# Patient Record
Sex: Male | Born: 1945
Health system: Southern US, Community
[De-identification: ages and names within clinical notes are randomized; demographics above are authoritative.]

## PROBLEM LIST (undated history)

## (undated) DIAGNOSIS — N390 Urinary tract infection, site not specified: Secondary | ICD-10-CM

## (undated) DIAGNOSIS — E669 Obesity, unspecified: Secondary | ICD-10-CM

## (undated) DIAGNOSIS — G8929 Other chronic pain: Secondary | ICD-10-CM

## (undated) DIAGNOSIS — N529 Male erectile dysfunction, unspecified: Secondary | ICD-10-CM

## (undated) DIAGNOSIS — R001 Bradycardia, unspecified: Secondary | ICD-10-CM

## (undated) DIAGNOSIS — I472 Ventricular tachycardia, unspecified: Secondary | ICD-10-CM

## (undated) DIAGNOSIS — M1990 Unspecified osteoarthritis, unspecified site: Secondary | ICD-10-CM

## (undated) DIAGNOSIS — M779 Enthesopathy, unspecified: Secondary | ICD-10-CM

## (undated) DIAGNOSIS — I499 Cardiac arrhythmia, unspecified: Secondary | ICD-10-CM

## (undated) DIAGNOSIS — D509 Iron deficiency anemia, unspecified: Secondary | ICD-10-CM

## (undated) DIAGNOSIS — I4729 Other ventricular tachycardia: Secondary | ICD-10-CM

## (undated) DIAGNOSIS — R51 Headache: Secondary | ICD-10-CM

## (undated) DIAGNOSIS — Z720 Tobacco use: Secondary | ICD-10-CM

## (undated) DIAGNOSIS — I119 Hypertensive heart disease without heart failure: Secondary | ICD-10-CM

## (undated) DIAGNOSIS — I4892 Unspecified atrial flutter: Secondary | ICD-10-CM

## (undated) DIAGNOSIS — I10 Essential (primary) hypertension: Secondary | ICD-10-CM

## (undated) DIAGNOSIS — R519 Headache, unspecified: Secondary | ICD-10-CM

## (undated) HISTORY — DX: Essential (primary) hypertension: I10

## (undated) HISTORY — DX: Headache, unspecified: R51.9

## (undated) HISTORY — DX: Unspecified osteoarthritis, unspecified site: M19.90

## (undated) HISTORY — DX: Morbid (severe) obesity due to excess calories: E66.01

## (undated) HISTORY — DX: Urinary tract infection, site not specified: N39.0

## (undated) HISTORY — DX: Obesity, unspecified: E66.9

## (undated) HISTORY — DX: Iron deficiency anemia, unspecified: D50.9

## (undated) HISTORY — DX: Male erectile dysfunction, unspecified: N52.9

## (undated) HISTORY — DX: Tobacco use: Z72.0

## (undated) HISTORY — DX: Enthesopathy, unspecified: M77.9

## (undated) HISTORY — DX: Other chronic pain: G89.29

## (undated) HISTORY — PX: VASECTOMY: SHX75

## (undated) HISTORY — DX: Headache: R51

## (undated) HISTORY — DX: Unspecified atrial flutter: I48.92

---

## 2001-03-18 ENCOUNTER — Inpatient Hospital Stay (HOSPITAL_COMMUNITY): Admission: EM | Admit: 2001-03-18 | Discharge: 2001-03-22 | Payer: Self-pay | Admitting: Emergency Medicine

## 2001-03-18 ENCOUNTER — Encounter: Payer: Self-pay | Admitting: Emergency Medicine

## 2001-03-19 ENCOUNTER — Encounter: Payer: Self-pay | Admitting: Internal Medicine

## 2001-03-21 ENCOUNTER — Encounter: Payer: Self-pay | Admitting: Internal Medicine

## 2001-03-22 DIAGNOSIS — D509 Iron deficiency anemia, unspecified: Secondary | ICD-10-CM | POA: Diagnosis present

## 2001-03-22 HISTORY — DX: Iron deficiency anemia, unspecified: D50.9

## 2007-08-12 HISTORY — PX: TOTAL KNEE ARTHROPLASTY: SHX125

## 2007-08-15 ENCOUNTER — Inpatient Hospital Stay (HOSPITAL_COMMUNITY): Admission: RE | Admit: 2007-08-15 | Discharge: 2007-08-20 | Payer: Self-pay | Admitting: Orthopedic Surgery

## 2007-12-23 ENCOUNTER — Inpatient Hospital Stay (HOSPITAL_COMMUNITY): Admission: RE | Admit: 2007-12-23 | Discharge: 2007-12-27 | Payer: Self-pay | Admitting: Orthopedic Surgery

## 2008-08-26 ENCOUNTER — Emergency Department (HOSPITAL_COMMUNITY): Admission: EM | Admit: 2008-08-26 | Discharge: 2008-08-26 | Payer: Self-pay | Admitting: Emergency Medicine

## 2010-05-13 ENCOUNTER — Ambulatory Visit: Payer: Self-pay | Admitting: Cardiology

## 2010-05-30 ENCOUNTER — Ambulatory Visit (INDEPENDENT_AMBULATORY_CARE_PROVIDER_SITE_OTHER): Payer: Medicare Other | Admitting: Cardiology

## 2010-05-30 ENCOUNTER — Other Ambulatory Visit: Payer: Self-pay | Admitting: Cardiology

## 2010-05-30 ENCOUNTER — Ambulatory Visit
Admission: RE | Admit: 2010-05-30 | Discharge: 2010-05-30 | Disposition: A | Discharge status: Home / Self Care | Payer: Medicare Other | Source: Ambulatory Visit | Attending: Cardiology | Admitting: Cardiology

## 2010-05-30 DIAGNOSIS — I4892 Unspecified atrial flutter: Secondary | ICD-10-CM

## 2010-05-30 DIAGNOSIS — Z7901 Long term (current) use of anticoagulants: Secondary | ICD-10-CM

## 2010-05-30 DIAGNOSIS — Z01811 Encounter for preprocedural respiratory examination: Secondary | ICD-10-CM

## 2010-05-30 DIAGNOSIS — R0602 Shortness of breath: Secondary | ICD-10-CM

## 2010-05-30 DIAGNOSIS — I10 Essential (primary) hypertension: Secondary | ICD-10-CM

## 2010-06-03 ENCOUNTER — Ambulatory Visit (HOSPITAL_COMMUNITY)
Admission: RE | Admit: 2010-06-03 | Discharge: 2010-06-03 | Disposition: A | Discharge status: Home / Self Care | Payer: Medicare Other | Source: Ambulatory Visit | Attending: Cardiology | Admitting: Cardiology

## 2010-06-03 DIAGNOSIS — R791 Abnormal coagulation profile: Secondary | ICD-10-CM | POA: Insufficient documentation

## 2010-06-03 DIAGNOSIS — I4891 Unspecified atrial fibrillation: Secondary | ICD-10-CM | POA: Insufficient documentation

## 2010-06-03 DIAGNOSIS — Z5309 Procedure and treatment not carried out because of other contraindication: Secondary | ICD-10-CM | POA: Insufficient documentation

## 2010-06-03 LAB — PROTIME-INR
INR: 1.67 — ABNORMAL HIGH (ref 0.00–1.49)
Prothrombin Time: 19.9 seconds — ABNORMAL HIGH (ref 11.6–15.2)

## 2010-06-10 ENCOUNTER — Encounter (INDEPENDENT_AMBULATORY_CARE_PROVIDER_SITE_OTHER): Payer: Medicare Other

## 2010-06-10 DIAGNOSIS — Z7901 Long term (current) use of anticoagulants: Secondary | ICD-10-CM

## 2010-06-10 DIAGNOSIS — I4892 Unspecified atrial flutter: Secondary | ICD-10-CM

## 2010-06-16 ENCOUNTER — Encounter (INDEPENDENT_AMBULATORY_CARE_PROVIDER_SITE_OTHER): Payer: Medicare Other

## 2010-06-16 DIAGNOSIS — Z7901 Long term (current) use of anticoagulants: Secondary | ICD-10-CM

## 2010-06-16 DIAGNOSIS — I4892 Unspecified atrial flutter: Secondary | ICD-10-CM

## 2010-06-27 ENCOUNTER — Other Ambulatory Visit (INDEPENDENT_AMBULATORY_CARE_PROVIDER_SITE_OTHER): Payer: Medicare Other

## 2010-06-27 DIAGNOSIS — Z7901 Long term (current) use of anticoagulants: Secondary | ICD-10-CM

## 2010-06-27 DIAGNOSIS — I4892 Unspecified atrial flutter: Secondary | ICD-10-CM

## 2010-06-28 ENCOUNTER — Other Ambulatory Visit: Payer: Self-pay | Admitting: Emergency Medicine

## 2010-06-28 ENCOUNTER — Emergency Department (HOSPITAL_COMMUNITY)
Admission: EM | Admit: 2010-06-28 | Discharge: 2010-06-28 | Disposition: A | Discharge status: Home / Self Care | Payer: Medicare Other | Attending: Emergency Medicine | Admitting: Emergency Medicine

## 2010-06-28 DIAGNOSIS — E876 Hypokalemia: Secondary | ICD-10-CM | POA: Insufficient documentation

## 2010-06-28 DIAGNOSIS — L02419 Cutaneous abscess of limb, unspecified: Secondary | ICD-10-CM | POA: Insufficient documentation

## 2010-06-28 DIAGNOSIS — M7989 Other specified soft tissue disorders: Secondary | ICD-10-CM | POA: Insufficient documentation

## 2010-06-28 DIAGNOSIS — M79609 Pain in unspecified limb: Secondary | ICD-10-CM

## 2010-06-28 DIAGNOSIS — I4891 Unspecified atrial fibrillation: Secondary | ICD-10-CM | POA: Insufficient documentation

## 2010-06-28 DIAGNOSIS — Z7901 Long term (current) use of anticoagulants: Secondary | ICD-10-CM | POA: Insufficient documentation

## 2010-06-28 DIAGNOSIS — I10 Essential (primary) hypertension: Secondary | ICD-10-CM | POA: Insufficient documentation

## 2010-06-28 DIAGNOSIS — M545 Low back pain, unspecified: Secondary | ICD-10-CM | POA: Insufficient documentation

## 2010-06-28 DIAGNOSIS — Z79899 Other long term (current) drug therapy: Secondary | ICD-10-CM | POA: Insufficient documentation

## 2010-06-28 DIAGNOSIS — R599 Enlarged lymph nodes, unspecified: Secondary | ICD-10-CM | POA: Insufficient documentation

## 2010-06-28 DIAGNOSIS — E669 Obesity, unspecified: Secondary | ICD-10-CM | POA: Insufficient documentation

## 2010-06-28 LAB — DIFFERENTIAL
Basophils Relative: 0 % (ref 0–1)
Lymphocytes Relative: 21 % (ref 12–46)
Lymphs Abs: 1.6 10*3/uL (ref 0.7–4.0)
Monocytes Relative: 6 % (ref 3–12)
Neutro Abs: 5.2 10*3/uL (ref 1.7–7.7)

## 2010-06-28 LAB — BASIC METABOLIC PANEL
BUN: 14 mg/dL (ref 6–23)
CO2: 31 mEq/L (ref 19–32)
Calcium: 8.1 mg/dL — ABNORMAL LOW (ref 8.4–10.5)
Creatinine, Ser: 1.22 mg/dL (ref 0.4–1.5)
GFR calc non Af Amer: 59 mL/min — ABNORMAL LOW (ref >60)
Glucose, Bld: 149 mg/dL — ABNORMAL HIGH (ref 70–99)

## 2010-06-28 LAB — CBC
HCT: 34.2 % — ABNORMAL LOW (ref 39.0–52.0)
Hemoglobin: 11.3 g/dL — ABNORMAL LOW (ref 13.0–17.0)
MCH: 22.8 pg — ABNORMAL LOW (ref 26.0–34.0)
MCHC: 33 g/dL (ref 30.0–36.0)
RDW: 14.6 % (ref 11.5–15.5)

## 2010-07-01 ENCOUNTER — Ambulatory Visit (HOSPITAL_COMMUNITY)
Admission: RE | Admit: 2010-07-01 | Discharge: 2010-07-01 | Disposition: A | Discharge status: Home / Self Care | Payer: Medicare Other | Source: Ambulatory Visit | Attending: Cardiology | Admitting: Cardiology

## 2010-07-01 DIAGNOSIS — G4733 Obstructive sleep apnea (adult) (pediatric): Secondary | ICD-10-CM | POA: Insufficient documentation

## 2010-07-01 DIAGNOSIS — I4892 Unspecified atrial flutter: Secondary | ICD-10-CM | POA: Insufficient documentation

## 2010-07-01 DIAGNOSIS — I10 Essential (primary) hypertension: Secondary | ICD-10-CM | POA: Insufficient documentation

## 2010-07-01 DIAGNOSIS — E669 Obesity, unspecified: Secondary | ICD-10-CM | POA: Insufficient documentation

## 2010-07-01 HISTORY — PX: CARDIOVERSION: SHX1299

## 2010-07-03 NOTE — Op Note (Signed)
  NAMECORDARIOUS, WIGEN            ACCOUNT NO.:  [id number]  MEDICAL RECORD NO.:  [id number]           PATIENT TYPE:  O  LOCATION:  [facility]                         FACILITY:  [facility]  PHYSICIAN:  Peter M. Martinique, M.D.  DATE OF BIRTH:  [birth year]  DATE OF PROCEDURE:  07/01/2010 DATE OF DISCHARGE:  07/01/2010                              OPERATIVE REPORT   INDICATIONS FOR PROCEDURE:  A 65 year old African American male with persistent atrial flutter that is symptomatic.  The patient has been anticoagulated on Coumadin for at least 6 weeks.  PROCEDURE NOTE:  The patient was placed on monitor and given oxygen therapy.  Per Anesthesia, he was given IV Pentothal with good anesthesia.  He received a single biphasic synchronized DC shock at 150 joules with prompt conversion to sinus rhythm.  He had no complications.  FINAL ASSESSMENT:  Successful elective direct current cardioversion.          ______________________________ Peter M. Martinique, M.D.     PMJ/MEDQ  D:  07/01/2010  T:  07/02/2010  Job:  [id number]  Electronically Signed by PETER Martinique M.D. on 07/03/2010 03:41:13 PM

## 2010-07-04 ENCOUNTER — Telehealth: Payer: Self-pay | Admitting: Cardiology

## 2010-07-04 NOTE — Telephone Encounter (Signed)
Patient called about the potassium pills, he states he is feeling light headed and needs medication.  Please call cell number.

## 2010-07-07 ENCOUNTER — Ambulatory Visit (HOSPITAL_BASED_OUTPATIENT_CLINIC_OR_DEPARTMENT_OTHER): Payer: Medicare Other

## 2010-07-16 ENCOUNTER — Encounter: Payer: Self-pay | Admitting: Nurse Practitioner

## 2010-07-17 ENCOUNTER — Encounter: Payer: Self-pay | Admitting: Nurse Practitioner

## 2010-07-18 ENCOUNTER — Ambulatory Visit: Payer: Medicare Other | Admitting: Nurse Practitioner

## 2010-07-21 ENCOUNTER — Encounter: Payer: Self-pay | Admitting: Nurse Practitioner

## 2010-07-21 ENCOUNTER — Ambulatory Visit (INDEPENDENT_AMBULATORY_CARE_PROVIDER_SITE_OTHER): Payer: Medicare Other | Admitting: Nurse Practitioner

## 2010-07-21 VITALS — BP 140/90 | HR 60 | Ht 73.0 in | Wt 336.2 lb

## 2010-07-21 DIAGNOSIS — I4891 Unspecified atrial fibrillation: Secondary | ICD-10-CM

## 2010-07-21 DIAGNOSIS — I10 Essential (primary) hypertension: Secondary | ICD-10-CM

## 2010-07-21 DIAGNOSIS — I119 Hypertensive heart disease without heart failure: Secondary | ICD-10-CM | POA: Insufficient documentation

## 2010-07-21 DIAGNOSIS — I4892 Unspecified atrial flutter: Secondary | ICD-10-CM

## 2010-07-21 DIAGNOSIS — Z7901 Long term (current) use of anticoagulants: Secondary | ICD-10-CM

## 2010-07-21 NOTE — Assessment & Plan Note (Addendum)
INR is therapeutic. We will recheck in 2 weeks. Will discuss with Dr. Martinique his long term plan for anticoagulation.

## 2010-07-21 NOTE — Assessment & Plan Note (Signed)
He is not able to afford the Benicar Hct. Blood pressure is up. I have given him samples. We will plan on switching him to Hyzaar on return. Hopefully he can afford the generic. I explained the importance of blood pressure control to him.

## 2010-07-21 NOTE — Assessment & Plan Note (Signed)
He is improved post cardioversion. His EKG shows junctional tachycardia. Rate is 87. We will leave him on his coumadin. I will see him back in about 3 weeks with Dr. Martinique. We will plan to repeat the EKG on return.He may resume having sex.  Patient is agreeable to this plan and will call if any problems develop in the interim.

## 2010-07-21 NOTE — Patient Instructions (Addendum)
Stay on your current medicines. I have given you samples of the Benicar Hct. We will plan on changing you to Hyzaar on return. Your coumadin level is good. Stay on your same dose. We will see you back in 3 weeks and repeat your EKG on return. Call for any problems. You may resume your sexual relations. Take the bottle of Furosemide to the drug store today. They will call us for a refill.

## 2010-07-21 NOTE — Progress Notes (Signed)
History of Present Illness: Nathan Grant is seen today for a post hospital visit. He is seen for Dr. Martinique. He had a cardioversion on March 20th for atrial flutter. He is feeling better. He no longer has chest pain and is not short of breath like he was prior to the cardioversion. He is interested in resuming sexual relations. He has had no problems with his coumadin. His level looks good. Blood pressure is elevated. He is not able to afford the Benicar Hct. He also reports that he is on Lasix. We do not have this med listed. The drug store should be calling us later today.  Current Outpatient Prescriptions on File Prior to Visit  Medication Sig Dispense Refill  . amLODipine (NORVASC) 10 MG tablet Take 10 mg by mouth daily.        [name] aspirin 81 MG tablet Take 81 mg by mouth daily.        . fish oil-omega-3 fatty acids 1000 MG capsule Take 1 g by mouth daily.        . metoprolol succinate (TOPROL-XL) 25 MG 24 hr tablet Take 25 mg by mouth daily.        . multivitamin (THERAGRAN) per tablet Take 1 tablet by mouth daily.        . Tadalafil (CIALIS PO) Take by mouth as needed.        . Warfarin Sodium (COUMADIN PO) Take by mouth. Taking as directed.       . olmesartan-hydrochlorothiazide (BENICAR HCT) 40-25 MG per tablet Take 1 tablet by mouth daily.          Allergies no known allergies  Past Medical History  Diagnosis Date  . Atrial flutter     s/p cardioversion 06/2010  . Chronic anticoagulation   . HTN (hypertension)   . Morbid obesity   . Osteoarthritis     Past Surgical History  Procedure Date  . Total knee arthroplasty 2009    Bilateral  . Cardioversion 07/01/2010    History  Smoking status  . Never Smoker   Smokeless tobacco  . Current User  . Types: Chew    History  Alcohol Use No    Family History  Problem Relation Age of Onset  . Alcohol abuse Brother     Review of Systems: The review of systems is as noted above. He is not lightheaded or dizzy. He has not had any  further palpitations. He feels better overall. His swelling is better. All other systems were reviewed and are negative.  Physical Exam: BP 160/98  Pulse 60  Ht 6\' 1"  (1.854 m)  Wt 336 lb 3.2 oz (152.499 kg)  BMI 44.36 kg/m2 Patient is very pleasant and in no acute distress. He is morbidly obese. Skin is warm and dry. Color is normal.  HEENT is unremarkable. Normocephalic/atraumatic. PERRL. Sclera are nonicteric. Neck is supple. No masses. No JVD. Lungs are clear. Cardiac exam shows a regular rate and rhythm. Abdomen is soft and obese. Extremities are full with trace edema. Gait and ROM are intact. No gross neurologic deficits noted.  LABORATORY DATA:  INR today is 2.7                                         His EKG shows a junctional tachycardia with PVC's. Rate is 87. No atrial flutter.    Assessment / Plan:

## 2010-07-31 ENCOUNTER — Ambulatory Visit: Payer: Medicare Other | Admitting: Cardiology

## 2010-08-04 ENCOUNTER — Encounter: Payer: Medicare Other | Admitting: *Deleted

## 2010-08-11 ENCOUNTER — Encounter: Payer: Self-pay | Admitting: Cardiology

## 2010-08-11 ENCOUNTER — Encounter: Payer: Self-pay | Admitting: Nurse Practitioner

## 2010-08-11 ENCOUNTER — Ambulatory Visit (INDEPENDENT_AMBULATORY_CARE_PROVIDER_SITE_OTHER): Payer: Medicare Other | Admitting: Nurse Practitioner

## 2010-08-11 VITALS — BP 130/80 | HR 50 | Wt 335.0 lb

## 2010-08-11 DIAGNOSIS — I10 Essential (primary) hypertension: Secondary | ICD-10-CM

## 2010-08-11 DIAGNOSIS — I4891 Unspecified atrial fibrillation: Secondary | ICD-10-CM

## 2010-08-11 DIAGNOSIS — I4892 Unspecified atrial flutter: Secondary | ICD-10-CM

## 2010-08-11 DIAGNOSIS — R0609 Other forms of dyspnea: Secondary | ICD-10-CM

## 2010-08-11 DIAGNOSIS — R0600 Dyspnea, unspecified: Secondary | ICD-10-CM

## 2010-08-11 DIAGNOSIS — R0989 Other specified symptoms and signs involving the circulatory and respiratory systems: Secondary | ICD-10-CM

## 2010-08-11 DIAGNOSIS — E669 Obesity, unspecified: Secondary | ICD-10-CM

## 2010-08-11 LAB — BASIC METABOLIC PANEL
BUN: 14 mg/dL (ref 6–23)
CO2: 33 mEq/L — ABNORMAL HIGH (ref 19–32)
Calcium: 8.7 mg/dL (ref 8.4–10.5)
Chloride: 100 mEq/L (ref 96–112)
Creatinine, Ser: 1.1 mg/dL (ref 0.4–1.5)
GFR: 73.46 mL/min (ref >60.00)
Glucose, Bld: 92 mg/dL (ref 70–99)
Potassium: 3.4 mEq/L — ABNORMAL LOW (ref 3.5–5.1)
Sodium: 139 mEq/L (ref 135–145)

## 2010-08-11 LAB — BRAIN NATRIURETIC PEPTIDE: Pro B Natriuretic peptide (BNP): 218 pg/mL — ABNORMAL HIGH (ref 0.0–100.0)

## 2010-08-11 MED ORDER — FUROSEMIDE 20 MG PO TABS: 10.0000 mg | ORAL_TABLET | Freq: Every day | ORAL | Status: DC

## 2010-08-11 MED ORDER — LOSARTAN POTASSIUM-HCTZ 100-25 MG PO TABS: 1.0000 | ORAL_TABLET | Freq: Every day | ORAL | Status: DC

## 2010-08-11 NOTE — Assessment & Plan Note (Signed)
Blood pressure is better by me. I have switched him to Hyzaar 100/25. We will check a follow up BMET and BNP today. He may be able to stop his potassium.

## 2010-08-11 NOTE — Assessment & Plan Note (Signed)
Regular activity and cutting back on his caloric intake is encouraged.

## 2010-08-11 NOTE — Progress Notes (Signed)
   Nathan Grant Date of Birth: 07/23/1945   History of Present Illness: Nathan Grant is seen back today for a 3 week check. He is seen for Dr. Martinique. He is doing well. He notes that his breathing has been pretty good. He is not as tired. He denies palpitations. He is not able to check his blood pressure at home. He does not have a cuff. He denies chest pain. He had cardioversion back in March and has remained in sinus rhythm. He had a negative Cardiolite back in April of 2011. No evidence of ischemia or infarct. His EF was low by cardiolite but may have been due to gating. EF per echo was normal. His CHADs score is only one (HTN).   Current Outpatient Prescriptions on File Prior to Visit  Medication Sig Dispense Refill  . amLODipine (NORVASC) 10 MG tablet Take 10 mg by mouth daily.        [name] aspirin 81 MG tablet Take 81 mg by mouth daily.        . fish oil-omega-3 fatty acids 1000 MG capsule Take 1 g by mouth daily.        . metoprolol succinate (TOPROL-XL) 25 MG 24 hr tablet Take 25 mg by mouth daily.        . multivitamin (THERAGRAN) per tablet Take 1 tablet by mouth daily.        . Tadalafil (CIALIS PO) Take by mouth as needed.        [name] DISCONTD: olmesartan-hydrochlorothiazide (BENICAR HCT) 40-25 MG per tablet Take 1 tablet by mouth daily.        [name] DISCONTD: Warfarin Sodium (COUMADIN PO) Take by mouth. Taking as directed.         No Known Allergies  Past Medical History  Diagnosis Date  . Atrial flutter     s/p cardioversion 06/2010  . Chronic anticoagulation   . HTN (hypertension)   . Morbid obesity   . Osteoarthritis     Past Surgical History  Procedure Date  . Total knee arthroplasty 2009    Bilateral  . Cardioversion 07/01/2010    History  Smoking status  . Never Smoker   Smokeless tobacco  . Current User  . Types: Chew    History  Alcohol Use No    Family History  Problem Relation Age of Onset  . Alcohol abuse Brother     Review of Systems: The review of  systems is positive for erectile dysfunction.  All other systems were reviewed and are negative.  Physical Exam: BP 130/80  Pulse 50  Wt 335 lb (151.955 kg) Patient is very pleasant and in no acute distress. He is obese. Skin is warm and dry. Color is normal.  HEENT is unremarkable. Normocephalic/atraumatic. PERRL. Sclera are nonicteric. Neck is supple. No masses. No JVD. Lungs are clear. Cardiac exam shows a regular rate and rhythm. Abdomen is soft and obese.  Extremities are without edema. Gait and ROM are intact. No gross neurologic deficits noted.  LABORATORY DATA:  EKG today shows sinus rhythm with PVC's. He has diffuse T wave changes.    Assessment / Plan:

## 2010-08-11 NOTE — Assessment & Plan Note (Addendum)
He remains in sinus rhythm. His CHADs score is one. I have stopped his Coumadin. He remains on aspirin therapy. He can check his pulse and is willing to check it on a daily basis. He will call for any irregularity or other issues. He will see Dr. Martinique back in about 3 months. Patient is agreeable to this plan and will call if any problems develop in the interim.

## 2010-08-11 NOTE — Patient Instructions (Signed)
Stop the warfarin. Stay on your aspirin. Check your pulse daily. Let us know if it is not regular or if it is fast. I have refilled the Furosemide. I have changed the Benicar HCT to Hyzaar. Take this just one tablet daily. We will see you back in 3 months. We are going to check your labs today too.

## 2010-08-13 ENCOUNTER — Telehealth: Payer: Self-pay | Admitting: *Deleted

## 2010-08-13 ENCOUNTER — Other Ambulatory Visit: Payer: Self-pay | Admitting: *Deleted

## 2010-08-13 DIAGNOSIS — I4892 Unspecified atrial flutter: Secondary | ICD-10-CM

## 2010-08-13 NOTE — Telephone Encounter (Signed)
Notified son of results. Encouraged to increase foods in potassium. Sent to Dr. Coletta Memos

## 2010-08-13 NOTE — Telephone Encounter (Signed)
Message copied by Derek Mound on Wed Aug 13, 2010 10:56 AM ------      Message from: Truitt Merle      Created: Wed Aug 13, 2010  7:56 AM       Would stay on his current medicines. Try to increase foods higher in potassium. Will need to recheck a BMET on return.

## 2010-08-26 NOTE — Op Note (Signed)
Nathan Grant, Nathan Grant            ACCOUNT NO.:  [id number]   MEDICAL RECORD NO.:  [id number]          PATIENT TYPE:  INP   LOCATION:  5039                         FACILITY:  [facility]   PHYSICIAN:  Alta Corning, M.D.   DATE OF BIRTH:  Aug 24, 1945   DATE OF PROCEDURE:  12/23/2007  DATE OF DISCHARGE:                               OPERATIVE REPORT   PREOPERATIVE DIAGNOSIS:  End-stage degenerative joint disease, left  knee.   POSTOPERATIVE DIAGNOSIS:  End-stage degenerative joint disease, left  knee.   PRINCIPAL PROCEDURE:  Left total knee replacement with a sigma system  side-to-side femur and side-to-side tibia, 10-mm bridging bearing, and  41 mm all-polyethylene patella.  1. Computer-assisted left total knee replacement.   SURGEON:  Alta Corning, MD   ASSISTANT:  Gary Fleet, PA   ANESTHESIA:  General.   BRIEF HISTORY:  Nathan Grant is a 65 year old male with a long history  of having had severe bilateral degenerative joint disease.  We replaced  his right knee about 3 months ago.  He had done well with this, but he  continued to have complaints of severe pain in the left knee.  He was  also taken to the operating room for total knee replacement.   PROCEDURE:  The patient was taken to the operating room.  After adequate  anesthesia was obtained under general anesthetic, the patient placed in  supine on operating table.  The left leg was prepped and draped in the  usual sterile fashion.  Following this, the leg was exsanguinated and  tourniquet was inflated to 350 mmHg.  Following this, an incision was  made.  Subcutaneous tissues taken down at the level of the extensor  mechanism and the medial parapatellar arthrotomy was undertaken.  There  was significant amount of bleeding, which was encountered at this point  and we felt that we needed to let the tourniquet down.  He still had  further significant bleeding.  At that point, we re-exsanguinated the  knee when they got  the pressures under better control on top and  increased the tourniquet pressure to 400 and this gave reasonable  control down the operative field.  At this point, after arthrotomy was  performed, medial and lateral meniscectomy were performed as well as  anterior and posterior cruciate excision and then attention was turned  towards placement of the computer-assistance module, 2 pins in the  tibia, 2 pins in the femur and then a registration process, all these  adds about 30 minutes of surgical procedure.  Once this was completed,  the tibia was kept perpendicular to the long axis.  The femur was kept  perpendicular to the anatomic axis and then spacer blocks were put in  place.  The knee at this point could come into easy full extension and  was neutrally aligned.  At this point, the femur was sized and anterior  and posterior cuts were made as well as chamfers and a box cut.  Attention was turned to the tibia where rotational alignment was chosen  and the central portion of the tibial tray was drilled and  keeled.  Once  this was completed, trial components were put in place.  Excellent range  of motion was achieved, perfect neutral long alignment with a 12.5  bearing.  Attention was turned to the patella where a large patella was  encountered, but was cut down to the level of 14 mm and 41-mm  Polyethylene patella was placed.  Following this, the trial components  were removed.  The knee was copiously and thoroughly lavaged with  pulsatile lavage irrigation and suction dry.  The femoral components  were cemented in place, size 5 femur and size 5 tibia.  A trial 12.5  bridging bearing was placed and a 41-mm patella was put and the patellar  clamp was put in place and all cement had been hardened.  The computer  was evaluated and showed perfect neutral long alignment with perfect gap  balance.  The trial poly was then removed.  Tourniquet was let down.  All bleeding was controlled with  electrocautery.  The wound was  copiously and thoroughly irrigated again and suctioned dry.  A final  polyethylene was put in place.  A drain was placed laterally and the  knee was then closed.  Medial parapatellar arthrotomy was closed with 1  Vicryl running suture and the skin with 0 and 2-0 Vicryl, and skin  staples.  A sterile compression dressing was applied as well as knee  immobilizer and the patient was taken to the recovery room and was noted  to be in satisfactory condition.  Estimated blood loss of the procedure  was 250 mL.      Alta Corning, M.D.  Electronically Signed     JLG/MEDQ  D:  12/24/2007  T:  12/25/2007  Job:  [id number]

## 2010-08-26 NOTE — Op Note (Signed)
NAMETIRRELL, LUELLEN            ACCOUNT NO.:  [id number]   MEDICAL RECORD NO.:  [id number]          PATIENT TYPE:  INP   LOCATION:  5023                         FACILITY:  [facility]   PHYSICIAN:  Alta Corning, M.D.   DATE OF BIRTH:  10/09/1945   DATE OF PROCEDURE:  08/15/2007  DATE OF DISCHARGE:                               OPERATIVE REPORT   PREOPERATIVE DIAGNOSIS:  End-stage degenerative joint disease, bilateral  knees.   POSTOPERATIVE DIAGNOSIS:  End-stage degenerative joint disease,  bilateral knees.   OPERATIONS AND PROCEDURES:  1. Right total knee replacement with a sigma system side-to-side femur      and side-to-side tibia, 10-mm bridging bearing, and 41 mm      __________ . rotating patella.  2. Computer-assisted right total knee replacement.   SURGEON:  Alta Corning, MD   ASSISTANT:  Gary Fleet, PA   ANESTHESIA:  General.   BRIEF HISTORY:  Mr. Callands is a 65 year old male with a long  history  of having had significant bilateral degenerative joint disease who  treated conservative for prolonged period of time.  He has continued to  have __________  complaints of pain, despite antiinflammatory medication  activity modification and use of the cane, the patient was taken to the  operating room for right total knee replacement.  __________  large  size, related to the young age, it was felt that computer-assisted  alignment would be critical to give the __________  long leg below  prosthesis and __________  preoperatively.   PROCEDURE:  The patient was brought to the operating room.  After  adequate anesthesia was obtained under general anesthetic, the patient  placed in supine on table.  The right leg was then prepped and draped in  the usual sterile fashion.  Following this, right leg was examined and  __________  360 mmHg.  Following this, a midline incision was made.  Subcutaneous tissues taken at down the level of the extensor mechanism,  where the  medial parapatellar arthrotomy was under taken after marking  the extensor mechanism.  Once this was completed, attention was turned  towards the excision of medial and lateral meniscus as well as the  retropatellar fat pad.  The synovectomy was performed __________  axis  of the anterior femur and the anterior and posterior cruciates were  excised.  At this point, the case was stopped for placement of the  computer __________  in the femur.  At this point, the registration  process was under taken __________  photographs in the case.  Once this  was completed, the attention was turned towards the cutting the tibia  perpendicular to its long axis.  This was done with the computer  assistance.  Once this was completed, attention was turned towards the  femur __________  distal femur was taken and this was done under  computer guidance to create a rectangular space.  Once this was  completed, a trial  full extension.  There was, interestingly, very  large tibial tubercle osteophyte which did kind of __________  at the  top, but once we kind of  move it off to a side, we can tell this was an  excellent __________ .  Once this was completed, attention was turned  towards the femur which __________  essentially perfect trial of the  anteroposterior __________ .  Once this was completed, attention was  turned to the tibia which excised to __________  was placed as well as  the __________  the rotational care was then __________  in place.  Once  this was completed, attention was turned towards the placement  __________  side-to-side femur and side-to-side tibia __________  full  extension and perfect suture along the alignment with the __________  computer.  __________  symmetric and full extension and __________  flexion of 90 degrees.  The patellar was then excised to __________ .  Once this was completed, the trial of range of motion was under taken  and excellent __________  full flexion with  __________ .  At this point,  the tibia was copiously irrigated with lavage with __________  lavage  irrigation.  Once this was completed, the femoral compartments were  cemented in the place a size 5 femur and size 5 tibia, a 10 mm bridging  __________ .  The computer was again used to check the __________  flexion.  At this point, the __________  with electrocautery.  Once this  was completed, attention was turned towards the __________  full range  of motion and was __________ .  At this point, the __________  was  closed with 1 Vicryl, running subcu with 0 and 2-0 Vicryl, and skin with  staples.  A sterile compression dressing was applied as well as knee  immobilizer and __________ .  The patient was taken to the recovery room  and was noted to be in satisfactory condition.   ESTIMATED BLOOD LOSS:  Less than 300 mL.      Alta Corning, M.D.     Corliss Skains  D:  08/15/2007  T:  08/16/2007  Job:  [id number]

## 2010-08-26 NOTE — Op Note (Signed)
NAMEOBRYANT, BOREY            ACCOUNT NO.:  [id number]   MEDICAL RECORD NO.:  [id number]          PATIENT TYPE:  INP   LOCATION:  5023                         FACILITY:  [facility]   PHYSICIAN:  Alta Corning, M.D.   DATE OF BIRTH:  1946/03/26   DATE OF PROCEDURE:  08/15/2007  DATE OF DISCHARGE:  08/20/2007                               OPERATIVE REPORT   PREOPERATIVE DIAGNOSIS:  End-stage degenerative joint disease of  bilaterally knees.   POSTOPERATIVE DIAGNOSIS:  End-stage degenerative joint disease of  bilaterally knees.   PRINCIPAL PROCEDURE:  1. Right total knee replacement with Sigma system with a size 5 femur,      a size 5 tibia, a 10-mm bridging bearing, and a 41-mm all-      polyethylene patella.  2. Computer-assisted right total knee replacement.   SURGEON:  Alta Corning, M.D.   ASSISTANT:  Gary Fleet, P.A.   ANESTHESIA:  General.   BRIEF HISTORY:  Mr. Calkin is a 65 year old male with long history of  having had significant and severe bilateral degenerative joint disease.  He had been treated conservatively for a prolonged period of time.  Because of continued complaints of pain and bilateral knees, he was  evaluated in the office and found to have severe bone-on-bone arthritic  problems in both knees.  We had treated conservatively with  antiinflammatory medication activity modification.  Because of failure  of conservative care, he was ultimately taken to the operating room for  right total knee replacement.  Because of the severe nature of his  deformity and relatively young age, it was felt that computer-assistance  will be appropriate to be used intraoperatively, and this was chosen to  be used preoperatively.  He was brought to the operating room for this  procedure.   PROCEDURE:  The patient brought to the operating room and after adequate  anesthesia was obtained with general anesthetic, the patient was placed  supine on the operating table.   The right leg was then prepped and  draped in usual sterile fashion.  Following this, the leg was  exsanguinated and blood pressure tourniquet was inflated to 350 mmHg.  Following this, a midline incision was made and the subcutaneous tissue  was dissected down to the level of the extensor mechanism and a medial  parapatellar arthrotomy was undertaken.  The anterior and posterior  cruciates were then excised as well as medial lateral meniscus and the  retropatellar fat pad.  Once this was undertaken, the knee was exposed,  and the computer assistance modules were placed, 2 pins in the tibia, 2  pins in the femur, and the knee arrays were placed.  The registration  process was undertaken.  This adds about 20 minutes to the surgical  procedure.  Once the registration process was completed, the tibia was  cut perpendicular to the long axis, and attention was then turned to the  distal femur, which is cut perpendicular to the anatomic axis.  Once  this was completed, spacer blocks were put in place, and excellent  balance and neutral long alignment was achieved.  At  this point,  rotational alignment was performed with computer assistance, and the  anterior and posterior cuts were performed as well as the chamfer cuts.  Once this was done, the box cut was performed, and then the trial  component was put on the femur.  The tibia was then exposed and when  this was done, tibia was sized, and a tibia trial component was put in  place then rotationally appropriate alignment and the central portion  was then drilled and the rotational phalange was then placed.  Once this  was completed, a 10-mm bridging bearing was put in place and excellent  perfect neutral long alignment was achieved under computer assistance.  Once this was completed, the patella was cut to level of 14-mm, and then  these were drilled out with 3 holes of the patella and trial patellar  button was put in place.  Again under  computer assistance, the trial  range of motion was achieved, and excellent perfect neutral long  alignment with gap balancing in both flexion and extension.  At this  point, the trial components were removed.  The knee was then copiously  and thoroughly irrigated with pulse salvage irrigation and suction dry.  The areas of bone were then dried with a sponge.  At this point, the  cement was mixed on the back table, 2 batches with 2 g Kefzol and the  cement and the final components were cemented into place.  A size 5  femur, size 5 tibia, and 10-mm trial was put in place and the cement was  allowed to harden.  A 41-mm patella was cemented at place at this point  and patellar clamp was placed.  All cement was allowed to harden.  Once  the cement was harden, the tourniquet was let down, and the trial poly  was removed.  All bleeding were controlled with electrocautery and a  final polyethylene liner was put in place.  Once this was completed, the  wound was then copiously and thoroughly irrigated and suctioned dry.  The medial parapatellar arthrotomy was closed with 1 Vicryl running  suture and the skin with 1-2 Vicryl, and skin with skin staples.  A  sterile compression dressing was applied.  The patient was taken to the  recovery room and was noted to be in satisfactory condition.  Estimated  blood loss during the procedure was less than 50 mL.      Alta Corning, M.D.  Electronically Signed     JLG/MEDQ  D:  09/14/2007  T:  09/15/2007  Job:  [id number]

## 2010-08-26 NOTE — Consult Note (Signed)
NAMEJUNAH, Nathan Grant            ACCOUNT NO.:  [id number]   MEDICAL RECORD NO.:  [id number]          PATIENT TYPE:  INP   LOCATION:  5023                         FACILITY:  [facility]   PHYSICIAN:  Sol Blazing, M.D.DATE OF BIRTH:  1945-06-22   DATE OF CONSULTATION:  DATE OF DISCHARGE:                                 CONSULTATION   REASON FOR CONSULT:  Elevated creatinine.   HISTORY:  This is a 65 year old African-American male with a long  history of hypertension, obesity, and DJD.  He has end-stage  degenerative disease of both knees and was admitted on July 16, 2007 for  elective right total knee replacement.  Preop creatinine was 1.2.  The  creatinine in 2002 was 1.1.  The patient's creatinine was stable for a  couple of days and on Aug 19, 2007 bumped up to 2.5.  Today, on Aug 20, 2007, it was down to 1.9.  He has had good urine output 1500 mL  yesterday.  The patient did have some postoperative fevers and received  a few days of antibiotics.  He had a positive urine culture for  Pseudomonas.  Fevers have resolved.  He has had some postop edema in his  right leg.  He denies any history of kidney disease, kidney problems,  kidney stones, change in voiding habits, difficulty voiding, urinary  symptoms such as dysuria.  He denies any shortness of breath, chest  pain, fever, chills, sweats, nausea, diarrhea, abdominal pain also.  The  patient did receive Lasix 20 mg IV with a blood transfusion a day or two  ago.  He also got one dose of gentamicin on the day of surgery 80 mg.  He does take Diovan, which is angiotensin receptor blocker.  He is  currently ready for discharge, but his orthopedic surgeon wants a renal  opinion first.   PAST MEDICAL HISTORY:  1. Degenerative joint disease.  2. Obesity.  3. Hypertension.   PAST SURGICAL HISTORY:  None.   MEDICATIONS ON ADMISSION:  1. Nifedipine XL one a day.  2. Coreg 6.25 b.i.d.  3. Diovan 160/12.5 b.i.d.  4. Multivitamin once  a day.  5. KCl 10 mEq b.i.d.  6. He is currently on Coumadin also.   ALLERGIES:  None.   SOCIAL HISTORY:  He is chewing tobacco and no tobacco or alcohol.  He  lives with his wife.   REVIEW OF SYSTEMS:  As above.   PHYSICAL EXAMINATION:  VITAL SIGNS:  Blood pressure has been normal  135/62, pulse 70, respirations 18, temp 99, T-max 98.9 yesterday.  GENERAL:  This is an older black male in no distress, sitting in a chair  at the bedside.  His heart rate is 80, respirations 12.  SKIN:  Warm and dry.  HEENT:  PERRL, EOMI.  Throat is moist and clear.  NECK:  Supple with flat neck veins.  CHEST:  Clear anterior and posteriorly to auscultation and percussion.  CARDIAC:  He has a 2/6 harsh systolic ejection murmur at the right upper  sternal border with some radiation to the right carotid.  Otherwise,  cardiac exam  is negative with no other murmurs, rubs, or gallops.  GI:  Normal abdominal exam.  He is obese.  GU:  Deferred.  MUSCULOSKELETAL:  The patient has 2+ pitting edema in the right leg  below-the-knee, which he says is new since the surgery.  This is  probably postoperative edema.  He has minimal to trace pitting edema in  the left leg at the ankle and the foot.  He is significantly diffusely  obese.  NEUROLOGIC:  Alert and oriented x3, nonfocal motor exam.   LABORATORY DATA:  Hemoglobin 9.2, white blood count 8000, BUN 44,  creatinine 1.89 today, platelets 343.  Urine culture positive for  Pseudomonas.  Urinalysis, 30 protein, 3-6 red cells, and 7-10 white  cells.  Chest x-ray negative.  Blood culture is negative.   SUMMARY:  This is an obese black male admitted for elective right total  knee replacement.  His creatinine was stable at 1.2-1.3 postoperative  for 3 days and bumped up to 2.5 yesterday after he received IV Lasix  with a blood transfusion.  Creatinine trended down today to 1.8.  I  suspect this will normalize within the next 48 hours.  He does not need  to stay  in the hospital in my opinion and he can be discharged.  We will  relay this information to his primary doctor in [facility] and asked  that he follow up with his primary doctor by the middle of next week.  I  have asked him to hold his Diovan until he follows up with his primary  physician due to the episode of acute renal failure.   IMPRESSION:  1. Acute renal failure due to postop anemia, volume depletion, Lasix      and angiotensin receptor blocker; improving.  2. Status post right total knee replacement.  3. History of obesity.  4. Right leg edema, probably postoperative changes.  5. Hypertension long history, reasonably well controlled.   RECOMMENDATIONS:  Okay to discharge home and hold Diovan until the  patient can follow up with primary doctor.  We would have him see his  primary doctor next week to check his labs and renal function.  He does  not need outpatient nephrology followup.      Sol Blazing, M.D.  Electronically Signed     RDS/MEDQ  D:  08/20/2007  T:  08/21/2007  Job:  [id number]   cc:   Gwenyth Allegra, M.D.

## 2010-08-29 NOTE — Discharge Summary (Signed)
NAMETEEGAN, DESOCIO            ACCOUNT NO.:  [id number]   MEDICAL RECORD NO.:  [id number]          PATIENT TYPE:  INP   LOCATION:  5023                         FACILITY:  [facility]   PHYSICIAN:  Alta Corning, M.D.   DATE OF BIRTH:  06-19-1945   DATE OF ADMISSION:  08/15/2007  DATE OF DISCHARGE:  08/20/2007                               DISCHARGE SUMMARY   ADMITTING DIAGNOSES:  1. End-stage degenerative joint disease, right knee, bone on bone.  2. Hypertension.  3. History of chewing tobacco usage.   DISCHARGE DIAGNOSES:  1. End-stage degenerative joint disease right knee bone on bone.  2. Hypertension.  3. History of chewing tobacco usage.  4. Acute renal failure due to postoperative anemia.  5. Volume depletion, Lasix and ACE inhibitor.  6. Hypokalemia.  7. Pseudomonas urinary tract infection.   PROCEDURES IN HOSPITAL:  Right total knee arthroplasty computer-  assisted, Dorna Leitz, M.D.   CONSULTATIONS IN HOSPITAL:  Dr. Roney Jaffe, Renal Service.   BRIEF HISTORY:  This patient is a 65 year old pleasant male who has a  long history of pain in his right knee.  Standing x-rays of the right  knee shows that he has end-stage bone on bone degenerative arthritis.  He got only temporarily relief with cortisone injections and complained  of persistent pain with ambulation and night pain.   Radiographic and clinical findings, he is felt to be a candidate for a  right total knee replacement and is admitted for this.   PERTINENT LABORATORY STUDIES:  The patient's chest x-ray on Aug 17, 2007,  showed no acute chest findings.  The patient's hemoglobin on admission  was 10.3, hematocrit 31.0.  On postop day #1, hemoglobin was 8.9, on  postop day #2 was 8.2, and on postop day #3 was 7.9.  We followed this  again and the following day, his hemoglobin was 8.1 and after  transfusion of 2 units of packed RBCs, hemoglobin was up to 9.2,  hematocrit 27.2.  Protime on admission was 14.8  seconds with an INR of  1.1, PTT of 29.  On the date of discharge, his protime was 23.3 seconds  with an INR was 2.0.  The patient's CMET preoperatively showed decreased  potassium of 3.2.  EGFR was 54 and then on Aug 19, 2007, his EGFR was 26.  On postop day #1, his potassium was 3.0, postop day #2 was 3.1.  His BUN  and creatinine at that point were 18 and 1.30.  On Aug 19, 2007, his BUN  elevated to 39 with a creatinine of 2.5.  On Aug 20, 2007, BUN was 44  with a creatinine of 1.89.  Liver function studies were normal.  Urinalysis on admission showed greater than 300 protein, a large amount  of hemoglobin, few epithelials.  On Aug 18, 2007, he had hemoglobinuria  with rare epithelials and 3-6 wbc's per high-power field.  Blood  cultures from Aug 17, 2007, showed no growth 5 days.  Urine culture  showed 40,000 colonies for Pseudomonas aeruginosa.   HOSPITAL COURSE:  This patient underwent right total knee arthroplasty  computer  assisted and underwent procedure without difficulty.  Preoperatively, he was given 80 mg IV of gentamicin and 1 g of Ancef,  and 1 g of Ancef was given postoperatively IV q. 8 hours.  He was  started on Coumadin  anticoagulation therapy, DVT prophylaxis, and a PCA  morphine pump.  On post day #1, __________ taking fluid without  difficulty.  Foley which was placed at time of surgery was intact.  He  was afebrile.  His vital signs were stable.  His potassium was 3.0,  hemoglobin was 8.9, and INR was 1.1.  His Foley catheter was  discontinued.  Potassium was added to his IV fluid.  He was continued on  PCA morphine pump and p.o. Coumadin and was on oral iron.  On postop day  #2, his hemoglobin was 8.2, INR 1.5, and his potassium was still 3.1.  He was getting out of bed with physical therapy.  His PCA morphine pump  was discontinued and his IV was converted to heparin lock.  He was  switched to oral potassium.  On postop day #3, he had hemoglobin of 7.9.  He was  noncompliant.  He was voiding okay at that point.  He was  continued on anticoagulation and physical therapy.  On postop day #4, he  was feeling a little tired.  He was voiding without difficulty, with  some decreased urine output, but his vital signs were stable.  His  hemoglobin was 8.1, INR was 3.1.  He had an elevation in his BUN and  creatinine.  Two units of packed RBCs were transfused and renal consult  was obtained and this was greatly appreciated.  Renal Service suggested  holding the Diovan until followup with primary MD, which is Dr. Kathlen Mody  in [facility].  It was felt to be secondary to volume depletion as well  as Lasix and angiotensin receptor blocker usage.  It was felt that he  could be discharged home at that point and the patient was discharged  home on Aug 20, 2007.  Condition on discharge improved.  His vital signs  are stable.  His BUN was 44.  His creatinine was 1.89.  His urine output  was good.  His INR was 2.0 and his hemoglobin was 9.2.  __________ was  benign.  Good __________ status distally.  Renal Service suggested  following of BUN and creatinine with his medical doctor, Dr. Kathlen Mody in  [facility].   DIET ON DISCHARGE:  Regular.   MEDICATIONS ON DISCHARGE:  1. Percocet 5 mg p.r.n. pain.  2. Coumadin as directed x1 for postoperatively DVT prophylaxis.   ACTIVITIES:  Weightbearing as tolerated on the right with a walker and  he will have physical therapy and have Coumadin management.   FOLLOWUP:  This patient will follow up with Dr. Berenice Primas' office in 10-14  days and followup with Dr. Kathlen Mody for followup of his renal functions.      Gary Fleet, P.A.      Alta Corning, M.D.  Electronically Signed    JB/MEDQ  D:  10/05/2007  T:  10/06/2007  Job:  [id number]   cc:   Gwenyth Allegra, M.D.

## 2010-08-29 NOTE — Discharge Summary (Signed)
[facility]  Patient:    Nathan Grant, Nathan Grant Visit Number: [id number] MRN: [id number]          Service Type: MED Location: [phone] Attending Physician:  Phifer, Stefan Church Dictated by:   Sharlet Salina, M.D. Admit Date:  03/18/2001 Discharge Date: 03/22/2001   CC:         Birdie Riddle, M.D.  Gwenyth Allegra, M.D.   Discharge Summary  DISCHARGE DIAGNOSES: 1. Chest pain. 2. Microcytic anemia. 3. Hypertension. 4. Osteoarthritis. 5. Tendonitis. 6. Weight loss secondary to stimulant medication. 7. Obesity. 8. Dizziness. 9. Chronic headaches.  DISCHARGE MEDICATIONS: 1. Procardia 90 mg 1 p.o. q.d. 2. Maxzide 37.5/25 1 p.o. q.d. 3. Ferrous sulfate 325 mg b.i.d.  FOLLOW-UP:  Dr. Birdie Riddle, cardiologist, in one month.  His number is [phone].  Also follow up with Dr. Gwenyth Allegra in one week in [facility].  His telephone number is [phone].  CONSULTANT:  Birdie Riddle, M.D., cardiology.  PROCEDURES: 1. Adenosine-Cardiolite stress test. 2. Spiral CT.  CHIEF COMPLAINT:  Chest pain going down the right arm.  HISTORY OF PRESENT ILLNESS:  Nathan Grant is a 65 year old African-American male with past medical history significant for hypertension.  Three months ago, he started having chest pain on the right side of his chest with tingling and numbness in the left hand.  The pain usually lasted for minutes and then go away.  The pain usually occurs when he awakes in the morning.  On the morning of admission, the patient awoke with the pain.  He had no nausea or vomiting.  No diaphoresis and no shortness of breath.  The pain did not go away on this occasion.  On a scale of 1 to 10, the pain was about an 8 out of 10.  He took some aspirin without relief.  The pain was sharp, twisting, and movement made it worse.  The patient also stated that he has been taking diet drugs for the past year and a quarter and he switched  to a new diet pill Uptodrine three months ago.  He has lost 60 pounds in a year and a quarter with the weight loss pill.  The patient states that whatever activity that he does, like baring down, bending over, coughing, sneezing caused his chest pain to increase.  PAST MEDICAL HISTORY:  Hypertension for the past 10 years.  Osteoarthritis. Tendonitis.  Obesity.  Chronic headaches.  The patient stated that he pull four of his teeth out with pliers.  He has had a vasectomy.  FAMILY HISTORY:  He did not know his father.  His mother died at 44.  She had hypertension.  Brother with hypertension and brother with diabetes.  SOCIAL HISTORY:  Chews tobacco since he was 65 years old.  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  Six children.  MEDICATIONS:  Aspirin 325 mg q.d., Xenedrine diet medication, switched to Uptodrine for the past three months, apple cider vinegar, Maxzide, and Procardia 90 mg q.d.  ALLERGIES:  No known drug allergies.  REVIEW OF SYSTEMS:  Weight loss of 60 pounds.  One pillow at night.  Positive for palpitations.  Positive for numbness and tingling in the fingers of the left hand and cold intolerance.  PHYSICAL EXAMINATION:  VITAL SIGNS:  Temperature 98.4, blood pressure 157/73, pulse 56, respirations 20.  Pulse oximeter 97% on room air.  GENERAL:  The patient is sitting up in chair in no apparent distress.  HEENT:  Head is normocephalic and atraumatic.  Pupils are equal, round and reactive to light.  Sclerae anicteric.  Throat has no erythema.  CARDIOVASCULAR:  Regular rate and rhythm.  S1 and S2 constant in intensity.  A 2/6 systolic murmur heard best at the second left intercostal.  PMI in the left sternal border.  LUNGS:  Clear to auscultation bilaterally.  No wheezes, rhonchus, or crackles.  ABDOMEN:  Soft, nontender, and nondistended.  Positive bowel sounds.  RECTAL:  Heme negative.  EXTREMITIES:  No edema.  There is 2+ DP pulse bilaterally.  LYMPH  NODES:  No lymphadenopathy.  NEUROLOGICAL:  Cranial nerves II-XII intact.  Strength 5/5 throughout.  DTRs 1+ throughout.  HOSPITAL COURSE:  #1 - RULE OUT MYOCARDIAL INFARCTION:  The patient was admitted to rule out MI. Because the patient is a truckdriver and obese, he is at increased risk for DVTs leading to PE, and because of the patients presentation, we first ruled out PE with a spiral CT that was negative, d-dimer that was less than 0.22. The patient most likely did not have PE.  The patients physical exam was consistent with possible IHSS with Valsalva causing the patient when bending over causing the patient to feel dizzy with IHSS.  Still pain increasing blood flow back to the heart which increases preload would cause the murmur to get softer.  In this case, the patients murmur got softer when he would stoop and when he stands which decreases preload.  His murmur got louder.  The patient had a 2-D echocardiogram done to rule out IHSS before getting Cardiolite stress test to rule out ischemia.  The patients 2-D echocardiogram was normal.  So, he then underwent Cardiolite stress test which was also negative.  #2 - MICROCYTIC ANEMIA:  The patient had iron studies done that showed iron of 29, TIBC of 276, and percent saturations of 11.  He did have heme negative stools, and with this profile, it looks more consistent with anemia of chronic disease.  The patient, however, would probably benefit from a GI workup.  The patient should follow-up with his microcytic anemia.  The patient has never had a colonoscopy to follow up with the microcytic anemia workup with his primary care physician.  #3 - HYPERTENSION:  The patients blood pressure remained stable on his home medication during his hospital stay.   #4 - OSTEOARTHRITIS:  The patient was treated with ibuprofen while he was here and he did have some improvement in his osteoarthritis.  #5 - WEIGHT LOSS:  The patient was  advised not to take any more of the diet medication because that could be one of the reasons for his chest pain.  #6 - OBESITY:  The patient was advised on the essential of diet and exercise and in trying to help with his weight loss.  DISCHARGE LABORATORY DATA:  WBC 6.4, hemoglobin 10.4, hematocrit 32.2, MCV 70.3, RDW 15.6, platelets 324,000.  Neutrophils 62%.  Hemoccult negative. Sodium 139, potassium 3.8, chloride 105, CO2 27, glucose 143, BUN 14, creatinine 1.1, calcium 8.7, total protein 6.7, albumin 3.7, AST 27, ALT 23, alkaline phosphatase 55, total bilirubin 0.4.  CK 422, CK-MB 4.5, relative index 1.1, troponin 0.01.  All other cardiac enzymes were negative.  Lipid profile showed a total cholesterol of 172, HDL of 44, triglycerides of 75, LDL of 113.  TSH 0.975.  Free T4 0.66.  Iron low at 29, TIBC normal at 276, percent saturation low at 11%.  Chest x-ray  showed no acute abnormality.  Cardiolite stress test negative for carotid stress induced ischemia.  Left ventricle has a ejection fraction of 52%.  A 2-D echocardiogram showed overall left ventricular systolic function normal, left ventricle EF of 55-65%.  No left ventricular regional wall motion abnormalities.  Left ventricular wall thickness was mildly increased.  Trivial aortic valvular regurgitation.  Left atrium was mildly dilated. Dictated by:   Sharlet Salina, M.D. Attending Physician:  Phifer, Stefan Church DD:  03/31/01 TD:  04/02/01 Job: [id number]

## 2010-08-29 NOTE — Discharge Summary (Signed)
NAMEGIO, LOCKE            ACCOUNT NO.:  [id number]   MEDICAL RECORD NO.:  [id number]          PATIENT TYPE:  INP   LOCATION:  5039                         FACILITY:  [facility]   PHYSICIAN:  Alta Corning, M.D.   DATE OF BIRTH:  [birth year]   DATE OF ADMISSION:  12/23/2007  DATE OF DISCHARGE:  12/27/2007                               DISCHARGE SUMMARY   ADMITTING DIAGNOSES:  1. End-stage degenerative joint disease, left knee.  2. Hypertension.  3. Anemia.  4. Status post right total knee arthroplasty, May 2009.   DISCHARGE DIAGNOSES:  1. End-stage degenerative joint disease, left knee.  2. Hypertension.  3. Anemia.  4. Status post right total knee arthroplasty, May 2009.  5. Acute blood loss anemia.  6. Acute renal insufficiency.   PROCEDURES IN HOSPITAL:  Left total knee arthroplasty, computer  assisted, Dorna Leitz, MD, December 23, 2007.   BRIEF HISTORY:  This patient is a 65 year old male who is well known to  [place].  He has a long history of pain in his left knee.  Standing x-rays of  the left knee showed end-stage DJD.  He has night pain and pain with  ambulation.  He did not improve with exhaustive conservative treatment  including modification of activity, medication, and cortisone  injections.  Based upon his clinical and radiographic findings, he is  admitted for a left total knee replacement.   PERTINENT LABORATORY STUDIES:  The patient's hemoglobin on admission was  11.2, hematocrit 35.3.  On postop day #1, his hemoglobin was 8.5,  hematocrit 26.3.  Postop day #2, hemoglobin 7.2, hematocrit of 22.2.  Two units of packed RBCs were transfused, bringing his hemoglobin up to  8.4, and then on December 27, 2007, day of discharge, hemoglobin was  8.1.  His protime on admission was 14.4 seconds with an INR of 1.1.  His  protime on discharge date, December 27, 2007, was 26.5 and 2.3, INR, on  Coumadin therapy.  CMET on admission showed no abnormalities.  BMET was  followed on a regular basis, and his potassium remained stable  throughout the hospitalization.  He did have a slightly elevated  creatinine up to 2.22 with a BUN of 31, but this was noted to be BUN of  21 and 1.29 on the date of discharge.   Urinalysis on admission showed small amount of leukocyte esterase,  moderate hemoglobin, a hazy appearance, 15 ketones, and greater than 300  protein.  Two units of packed RBCs were transfused on December 25, 2007.   HOSPITAL COURSE:  The patient underwent left total knee arthroplasty as  well described in Dr. Berenice Primas' operative note on December 23, 2007.  He  was started on a PCA morphine pump postoperatively and Coumadin  antithrombotic therapy per pharmacy protocol.  Preoperatively, he was  given 80 mg of gentamicin and 2 g of Ancef, and 1 g Ancef was given q.8  h. x3 doses postop.  CPM machine was used for left knee range of motion.  Physical therapy was ordered for walker ambulation, weightbearing as  tolerated on the left.  On postop day #  1, he had complaints of knee  pain.  He overall felt well.  His creatinine was slightly increased, and  we continued him on fluid management.  On postop day #2, he complained  of left knee pain.  He had some mild indigestion, was taking fluids  without difficulty.  Foley catheter had been placed at the time of  surgery, was in place, and draining well.  His hemoglobin was 7.2, his  BUN was 31.  His creatinine was 2.22.  He is continuing on IV fluids.  His INR was 1.4.  Two units of packed RBCs were transfused.  His  dressing was changed, and his drain was pulled.  He was started on oral  Protonix.  Postop day #3, he felt much better.  He had not had a bowel  movement.  However, he was taking fluids and voiding without difficulty.  His Foley catheter had been discontinued the day before.  Hemoglobin was  8.4.  His INR was 1.9.  He made good progress with physical therapy and  was then discharged home on  December 27, 2007.  He had had a bowel  movement.  He was taking fluids and voiding without difficulty.  He was  afebrile.  His vital signs were stable.  His left knee wound was benign.  His calf was soft and nontender.  Hemoglobin was 8.1.  His BUN was 21,  with creatinine 1.29.  He was voiding well.  His INR was 2.3.  His  saline lock was discontinued.  He was discharged home in improved  condition.  He was on a regular diet.  He was given prescription for  Percocet 5 mg p.r.n. for pain and Coumadin one daily as directed x1  month for postoperative DVT prophylaxis and Robaxin 750 mg, #40 tablets,  one q.8 h. p.r.n. spasm.  He will get a CBC checked in 5 days via home  health and will follow up with Dr. Berenice Primas in the office in 10 days.  He  will call us if he has any problems.      Gary Fleet, P.A.      Alta Corning, M.D.  Electronically Signed    JB/MEDQ  D:  03/13/2008  T:  03/13/2008  Job:  [id number]   cc:   Dr. Kathlen Mody

## 2010-10-01 ENCOUNTER — Telehealth: Payer: Self-pay | Admitting: Cardiology

## 2010-10-01 NOTE — Telephone Encounter (Signed)
Called requesting prescription of Proventil. Per Dr. Martinique needs to have PCP order that med. Advised pt.

## 2010-10-01 NOTE — Telephone Encounter (Signed)
Called in and would like Dr. Martinique to refill his prescription of Proventil at [facility] on [address]: [id number]. I have pulled his chart.

## 2010-10-22 ENCOUNTER — Other Ambulatory Visit: Payer: Self-pay | Admitting: *Deleted

## 2010-10-22 DIAGNOSIS — Z79899 Other long term (current) drug therapy: Secondary | ICD-10-CM

## 2010-11-03 ENCOUNTER — Ambulatory Visit: Payer: Medicare Other | Admitting: Nurse Practitioner

## 2010-11-06 ENCOUNTER — Encounter: Payer: Self-pay | Admitting: Cardiology

## 2010-11-10 ENCOUNTER — Other Ambulatory Visit: Payer: Self-pay | Admitting: *Deleted

## 2010-11-10 ENCOUNTER — Encounter: Payer: Self-pay | Admitting: Cardiology

## 2010-11-10 ENCOUNTER — Ambulatory Visit (INDEPENDENT_AMBULATORY_CARE_PROVIDER_SITE_OTHER): Payer: Medicare Other | Admitting: Cardiology

## 2010-11-10 ENCOUNTER — Other Ambulatory Visit (INDEPENDENT_AMBULATORY_CARE_PROVIDER_SITE_OTHER): Payer: Medicare Other | Admitting: *Deleted

## 2010-11-10 VITALS — BP 204/110 | HR 64 | Ht 73.0 in | Wt 336.4 lb

## 2010-11-10 DIAGNOSIS — I10 Essential (primary) hypertension: Secondary | ICD-10-CM

## 2010-11-10 DIAGNOSIS — Z79899 Other long term (current) drug therapy: Secondary | ICD-10-CM

## 2010-11-10 DIAGNOSIS — I4892 Unspecified atrial flutter: Secondary | ICD-10-CM

## 2010-11-10 MED ORDER — METOPROLOL TARTRATE 25 MG PO TABS: 25.0000 mg | ORAL_TABLET | Freq: Two times a day (BID) | ORAL | Status: DC

## 2010-11-10 NOTE — Assessment & Plan Note (Signed)
No recurrence of atrial flutter. He does have occasional PVCs. He needs to resume his antihypertensive therapy.

## 2010-11-10 NOTE — Telephone Encounter (Signed)
escribe medication per fax request  

## 2010-11-10 NOTE — Assessment & Plan Note (Signed)
Blood pressure severely elevated today. This is related to noncompliance. I stressed the importance of taking his blood pressure medicines to reduce his risk of long-term complications. We discussed the potential of switching some of his medicines to less expensive for generics but he states he will go ahead and get his prior medications filled. He needs to continue with sodium restriction. We'll followup again in 3 months and plan on checking fasting blood work at that time.

## 2010-11-10 NOTE — Progress Notes (Signed)
   Nathan Grant Date of Birth: [birth year]   History of Present Illness: [name] is seen for followup of his hypertension and atrial fibrillation. He denies any recurrent palpitations. He admits that he hasn't been taking any of his medications. He states he was having difficulty affording them. He does not have a blood pressure monitor at home. He does complain of some erectile dysfunction. He has had no dizziness or syncope. He denies any headache. He has had no chest pain.  Current Outpatient Prescriptions on File Prior to Visit  Medication Sig Dispense Refill  . aspirin 81 MG tablet Take 81 mg by mouth daily.        . fish oil-omega-3 fatty acids 1000 MG capsule Take 1 g by mouth daily.        . multivitamin (THERAGRAN) per tablet Take 1 tablet by mouth daily.        [name] amLODipine (NORVASC) 10 MG tablet Take 10 mg by mouth daily.        . furosemide (LASIX) 20 MG tablet Take 0.5 tablets (10 mg total) by mouth daily.  30 tablet  6  . losartan-hydrochlorothiazide (HYZAAR) 100-25 MG per tablet Take 1 tablet by mouth daily.  30 tablet  11  . metoprolol succinate (TOPROL-XL) 25 MG 24 hr tablet Take 25 mg by mouth daily.        . potassium chloride SA (K-DUR,KLOR-CON) 20 MEQ tablet Take 20 mEq by mouth 2 (two) times daily.        . pravastatin (PRAVACHOL) 20 MG tablet Take 20 mg by mouth daily.        . Tadalafil (CIALIS PO) Take by mouth as needed.          No Known Allergies  Past Medical History  Diagnosis Date  . Atrial flutter     s/p cardioversion 06/2010  . Chronic anticoagulation   . HTN (hypertension)   . Morbid obesity   . Osteoarthritis     Past Surgical History  Procedure Date  . Total knee arthroplasty 2009    Bilateral  . Cardioversion 07/01/2010    History  Smoking status  . Never Smoker   Smokeless tobacco  . Current User  . Types: Chew    History  Alcohol Use No    Family History  Problem Relation Age of Onset  . Alcohol abuse Brother     Review  of Systems: The review of systems is positive for erectile dysfunction.  All other systems were reviewed and are negative.  Physical Exam: BP 204/110  Pulse 64  Ht 6\' 1"  (1.854 m)  Wt 336 lb 6.4 oz (152.59 kg)  BMI 44.38 kg/m2 Patient is very pleasant and in no acute distress. He is obese. Skin is warm and dry. Color is normal.  HEENT is unremarkable. Normocephalic/atraumatic. PERRL. Sclera are nonicteric. Neck is supple. No masses. No JVD. Lungs are clear. Cardiac exam shows a regular rate and rhythm. There is a positive S4. There is no murmur. Abdomen is soft and obese.  Extremities are without edema. Gait and ROM are intact. No gross neurologic deficits noted.  LABORATORY DATA:  EKG today shows sinus rhythm with PVC's in a pattern of trigeminy. There is LVH with repolarization abnormality.   Assessment / Plan:

## 2010-11-10 NOTE — Patient Instructions (Signed)
It is very important that you take your blood pressure medications as prescribed.  We will have you return in 3 months with lab work.

## 2010-11-11 ENCOUNTER — Encounter: Payer: Self-pay | Admitting: Cardiology

## 2011-01-06 LAB — URINE MICROSCOPIC-ADD ON

## 2011-01-06 LAB — CBC
MCHC: 33.3
MCV: 71.2 — ABNORMAL LOW
Platelets: 302

## 2011-01-06 LAB — DIFFERENTIAL
Eosinophils Relative: 3
Lymphocytes Relative: 42
Lymphs Abs: 1.9
Monocytes Absolute: 0.3
Monocytes Relative: 7

## 2011-01-06 LAB — URINALYSIS, ROUTINE W REFLEX MICROSCOPIC
Bilirubin Urine: NEGATIVE
Glucose, UA: NEGATIVE
Protein, ur: >300 — AB
Specific Gravity, Urine: 1.014
Urobilinogen, UA: 1

## 2011-01-06 LAB — PROTIME-INR: Prothrombin Time: 14.8

## 2011-01-06 LAB — COMPREHENSIVE METABOLIC PANEL
AST: 22
Albumin: 3.5
Calcium: 8.7
Creatinine, Ser: 1.33
GFR calc Af Amer: >60
GFR calc non Af Amer: 54 — ABNORMAL LOW
Sodium: 138
Total Protein: 7.1

## 2011-01-06 LAB — TYPE AND SCREEN
ABO/RH(D): A POS
Antibody Screen: NEGATIVE

## 2011-01-07 LAB — CBC
HCT: 23.5 — ABNORMAL LOW
HCT: 24.8 — ABNORMAL LOW
HCT: 27.2 — ABNORMAL LOW
Hemoglobin: 9.2 — ABNORMAL LOW
MCHC: 32.3
MCHC: 33.7
MCHC: 33.8
MCV: 72.2 — ABNORMAL LOW
MCV: 73.3 — ABNORMAL LOW
Platelets: 215
Platelets: 221
Platelets: 268
Platelets: 349
RBC: 3.29 — ABNORMAL LOW
RBC: 3.39 — ABNORMAL LOW
RBC: 3.71 — ABNORMAL LOW
RBC: 3.71 — ABNORMAL LOW
RDW: 16.7 — ABNORMAL HIGH
RDW: 16.9 — ABNORMAL HIGH
WBC: 10.1
WBC: 8.4
WBC: 9.6

## 2011-01-07 LAB — BASIC METABOLIC PANEL
BUN: 17
BUN: 18
CO2: 28
CO2: 28
CO2: 31
Calcium: 8 — ABNORMAL LOW
Calcium: 8.2 — ABNORMAL LOW
Calcium: 8.3 — ABNORMAL LOW
Calcium: 8.3 — ABNORMAL LOW
Calcium: 8.5
Chloride: 98
Creatinine, Ser: 1.25
Creatinine, Ser: 1.3
Creatinine, Ser: 1.92 — ABNORMAL HIGH
Creatinine, Ser: 2.5 — ABNORMAL HIGH
GFR calc Af Amer: 32 — ABNORMAL LOW
GFR calc Af Amer: 43 — ABNORMAL LOW
GFR calc Af Amer: 44 — ABNORMAL LOW
GFR calc non Af Amer: 26 — ABNORMAL LOW
GFR calc non Af Amer: 36 — ABNORMAL LOW
GFR calc non Af Amer: 56 — ABNORMAL LOW
Glucose, Bld: 130 — ABNORMAL HIGH
Sodium: 127 — ABNORMAL LOW

## 2011-01-07 LAB — CROSSMATCH: Antibody Screen: NEGATIVE

## 2011-01-07 LAB — PROTIME-INR
INR: 1.1
INR: 1.5
INR: 2.1 — ABNORMAL HIGH
Prothrombin Time: 14.8
Prothrombin Time: 18.2 — ABNORMAL HIGH
Prothrombin Time: 23.4 — ABNORMAL HIGH
Prothrombin Time: 24.4 — ABNORMAL HIGH

## 2011-01-07 LAB — URINE CULTURE: Special Requests: POSITIVE

## 2011-01-07 LAB — URINE MICROSCOPIC-ADD ON

## 2011-01-07 LAB — URINALYSIS, ROUTINE W REFLEX MICROSCOPIC
Bilirubin Urine: NEGATIVE
Specific Gravity, Urine: 1.018
pH: 5

## 2011-01-07 LAB — CULTURE, BLOOD (ROUTINE X 2)

## 2011-01-12 LAB — BASIC METABOLIC PANEL
BUN: 21
CO2: 27
Chloride: 100
Glucose, Bld: 111 — ABNORMAL HIGH
Potassium: 4.5
Sodium: 133 — ABNORMAL LOW

## 2011-01-12 LAB — CBC
HCT: 24.8 — ABNORMAL LOW
Hemoglobin: 8.1 — ABNORMAL LOW
MCV: 76 — ABNORMAL LOW
Platelets: 261
RDW: 17.4 — ABNORMAL HIGH

## 2011-01-14 LAB — COMPREHENSIVE METABOLIC PANEL
AST: 25
Albumin: 3.9
BUN: 17
CO2: 27
Calcium: 9.1
Chloride: 104
Creatinine, Ser: 1.02
GFR calc Af Amer: >60
GFR calc non Af Amer: >60
Total Bilirubin: 0.8

## 2011-01-14 LAB — URINALYSIS, ROUTINE W REFLEX MICROSCOPIC
Bilirubin Urine: NEGATIVE
Ketones, ur: 15 — AB
Protein, ur: >300 — AB
Specific Gravity, Urine: 1.023
Urobilinogen, UA: 1

## 2011-01-14 LAB — BASIC METABOLIC PANEL
BUN: 31 — ABNORMAL HIGH
CO2: 25
CO2: 28
Calcium: 8.3 — ABNORMAL LOW
Chloride: 97
Chloride: 98
GFR calc Af Amer: 36 — ABNORMAL LOW
GFR calc Af Amer: 48 — ABNORMAL LOW
GFR calc Af Amer: >60
Potassium: 3.8
Potassium: 3.9
Sodium: 130 — ABNORMAL LOW
Sodium: 137

## 2011-01-14 LAB — TYPE AND SCREEN
ABO/RH(D): A POS
Antibody Screen: NEGATIVE

## 2011-01-14 LAB — CBC
HCT: 22.2 — ABNORMAL LOW
HCT: 25.4 — ABNORMAL LOW
HCT: 26.3 — ABNORMAL LOW
HCT: 35.3 — ABNORMAL LOW
Hemoglobin: 8.4 — ABNORMAL LOW
Hemoglobin: 8.5 — ABNORMAL LOW
MCHC: 31.7
MCHC: 32.4
MCHC: 33.3
MCV: 72.9 — ABNORMAL LOW
MCV: 73.8 — ABNORMAL LOW
MCV: 74.8 — ABNORMAL LOW
Platelets: 319
RBC: 3.05 — ABNORMAL LOW
RBC: 3.39 — ABNORMAL LOW
RBC: 3.61 — ABNORMAL LOW
WBC: 4.8
WBC: 9.2

## 2011-01-14 LAB — PROTIME-INR
INR: 1.1
INR: 1.3
INR: 1.9 — ABNORMAL HIGH
Prothrombin Time: 14.4

## 2011-01-14 LAB — DIFFERENTIAL
Basophils Absolute: 0
Lymphocytes Relative: 36
Lymphs Abs: 1.7
Neutro Abs: 2.5

## 2011-01-14 LAB — URINE MICROSCOPIC-ADD ON

## 2011-01-14 LAB — APTT: aPTT: 29

## 2011-02-16 ENCOUNTER — Other Ambulatory Visit: Payer: Medicare Other | Admitting: *Deleted

## 2011-02-16 ENCOUNTER — Ambulatory Visit: Payer: Medicare Other | Admitting: Cardiology

## 2011-02-17 ENCOUNTER — Other Ambulatory Visit: Payer: Self-pay

## 2011-02-17 ENCOUNTER — Telehealth: Payer: Self-pay | Admitting: Cardiology

## 2011-02-17 NOTE — Telephone Encounter (Signed)
I talked to the pt's family member who states the pt is sleeping.  I recommended that he go to the ER.

## 2011-02-17 NOTE — Telephone Encounter (Signed)
New message:  Pt states when he wakes up in the morning with chest discomfort and sweating, left hand has slight shaking.  It goes away during day.  Pain is lingering this am.

## 2011-02-17 NOTE — Telephone Encounter (Signed)
Pt is returning your call

## 2011-02-20 NOTE — Telephone Encounter (Signed)
RN called patient again to make sure if pt has taken his B/P today. Because of his chest discomfort ,sweating of forehead and slight shaking of left hand. Patient states " I take my B/P medications every day at 5:30 AM, I don't know what My B/P is because I don't have a B/P machine, Are you going to buy me one" I asked if he has been having headaches Then he gung up the phone on me.  Dr. Johnsie Cancel DOD recommended for patient take his B/P medication and make sure to get a home B/P machine and keep up with his B/P readings.Pt to keep his appointment with Dr. Martinique on 03/02/11. Patient aware he states "I will make a note of that".

## 2011-02-20 NOTE — Telephone Encounter (Signed)
Pt is requesting that Dr Martinique write him a prescription for the BP monitor.  He states [employer] told him they will pay for it if Dr Martinique writes the rx.[name]

## 2011-02-20 NOTE — Telephone Encounter (Signed)
Pt calling back stating we never rtn his call, I read him deborah lefler's message from 8:57a, he said he was home all day and we must have called the wrong number, I verified the number we left and it is correct and I assured him we would have asked for him by name and if it was the wrong number we would have been told, wants a call today re chest pains in the am, disapates as the day goes on, some sweating

## 2011-02-20 NOTE — Telephone Encounter (Signed)
Pt needs a prescription for him to purchase a blood pressure monitor for his use.  Please send this to Nathan Grant, Nathan Grant.  Pt number is [phone] please call and advise.

## 2011-02-20 NOTE — Telephone Encounter (Signed)
It is fine to prescribe him a blood pressure monitor if his insurance will help pay for it. Jaedyn Lard Martinique MD

## 2011-02-20 NOTE — Telephone Encounter (Signed)
Patient called C/O of chest ache  Score of "4"  And light pressure. He  brakes into a swatting every morning when he gets up, this has been going on  since this past Monday. Patient denies SOB. He would like to seen today if possible.

## 2011-02-23 NOTE — Telephone Encounter (Signed)
lm

## 2011-02-24 NOTE — Telephone Encounter (Signed)
Spoke w/ pt and advised that Dr. Martinique signed a prescription for him to get a BP monitor. Will be at front desk. Needs to monitor BP and keep recordings.

## 2011-03-02 ENCOUNTER — Encounter: Payer: Self-pay | Admitting: Cardiology

## 2011-03-02 ENCOUNTER — Telehealth: Payer: Self-pay | Admitting: *Deleted

## 2011-03-02 ENCOUNTER — Ambulatory Visit (INDEPENDENT_AMBULATORY_CARE_PROVIDER_SITE_OTHER): Payer: Medicare Other | Admitting: Cardiology

## 2011-03-02 ENCOUNTER — Other Ambulatory Visit (INDEPENDENT_AMBULATORY_CARE_PROVIDER_SITE_OTHER): Payer: Medicare Other | Admitting: *Deleted

## 2011-03-02 VITALS — BP 192/90 | HR 60 | Ht 73.0 in | Wt 336.0 lb

## 2011-03-02 DIAGNOSIS — I10 Essential (primary) hypertension: Secondary | ICD-10-CM

## 2011-03-02 DIAGNOSIS — E669 Obesity, unspecified: Secondary | ICD-10-CM

## 2011-03-02 DIAGNOSIS — E876 Hypokalemia: Secondary | ICD-10-CM

## 2011-03-02 DIAGNOSIS — I4892 Unspecified atrial flutter: Secondary | ICD-10-CM

## 2011-03-02 LAB — LIPID PANEL
LDL Cholesterol: 72 mg/dL (ref 0–99)
Total CHOL/HDL Ratio: 3
Triglycerides: 52 mg/dL (ref 0.0–149.0)

## 2011-03-02 LAB — HEPATIC FUNCTION PANEL
Albumin: 3.8 g/dL (ref 3.5–5.2)
Alkaline Phosphatase: 39 U/L (ref 39–117)

## 2011-03-02 LAB — BASIC METABOLIC PANEL
BUN: 17 mg/dL (ref 6–23)
CO2: 31 mEq/L (ref 19–32)
Chloride: 99 mEq/L (ref 96–112)
Creatinine, Ser: 1.4 mg/dL (ref 0.4–1.5)

## 2011-03-02 MED ORDER — POTASSIUM CHLORIDE CRYS ER 20 MEQ PO TBCR: 40.0000 meq | EXTENDED_RELEASE_TABLET | Freq: Two times a day (BID) | ORAL | Status: DC

## 2011-03-02 MED ORDER — AMLODIPINE BESYLATE 10 MG PO TABS: 10.0000 mg | ORAL_TABLET | Freq: Every day | ORAL | Status: DC

## 2011-03-02 NOTE — Progress Notes (Signed)
Nathan Grant Date of Birth: 22-Sep-1945   History of Present Illness: Nathan Grant is seen for followup of his hypertension and atrial fibrillation. He denies any recurrent palpitations. He admits that he ran out of his amlodipine.  He does not have a blood pressure monitor at home. He does complain of some erectile dysfunction. He has had no dizziness or syncope. He denies any headache. He has had no chest pain. He does complain of a dry tongue and feeling sleepy.  Current Outpatient Prescriptions on File Prior to Visit  Medication Sig Dispense Refill  . amLODipine (NORVASC) 10 MG tablet Take 10 mg by mouth daily.       [name] aspirin 81 MG tablet Take 81 mg by mouth daily.        . cloNIDine (CATAPRES) 0.1 MG tablet Take 0.1 mg by mouth 2 (two) times daily.        . fish oil-omega-3 fatty acids 1000 MG capsule Take 1 g by mouth daily.        [name] losartan-hydrochlorothiazide (HYZAAR) 100-25 MG per tablet Take 1 tablet by mouth daily.  30 tablet  11  . metoprolol tartrate (LOPRESSOR) 25 MG tablet Take 1 tablet (25 mg total) by mouth 2 (two) times daily.  60 tablet  5  . multivitamin (THERAGRAN) per tablet Take 1 tablet by mouth daily.        . potassium chloride SA (K-DUR,KLOR-CON) 20 MEQ tablet Take 20 mEq by mouth 2 (two) times daily.        . pravastatin (PRAVACHOL) 20 MG tablet Take 20 mg by mouth daily.        . Tadalafil (CIALIS PO) Take by mouth as needed.        [name] DISCONTD: furosemide (LASIX) 20 MG tablet Take 0.5 tablets (10 mg total) by mouth daily.  30 tablet  6    No Known Allergies  Past Medical History  Diagnosis Date  . Atrial flutter     s/p cardioversion 06/2010  . Chronic anticoagulation   . HTN (hypertension)   . Morbid obesity   . Osteoarthritis   . Degenerative joint disease      End-stage degenerative joint disease, left knee  . Anemia   . Acute renal insufficiency   . Tobacco chew use      History of chewing tobacco usage.   . Acute renal failure      Acute renal  failure due to postoperative anemia  . Volume depletion      Lasix and ACE inhibitor  . Hypokalemia   . Urinary tract infection      Pseudomonas urinary tract infection  . Obesity   . Microcytic anemia  03/22/2001  . Dizziness   . Chronic headaches   . Tendonitis     Past Surgical History  Procedure Date  . Total knee arthroplasty May 2009    Bilateral  . Cardioversion 07/01/2010    History  Smoking status  . Never Smoker   Smokeless tobacco  . Current User  . Types: Chew    History  Alcohol Use No    Family History  Problem Relation Age of Onset  . Alcohol abuse Brother   . Hypertension Brother   . Hypertension Mother     Review of Systems: As noted in HPI. All other systems were reviewed and are negative.  Physical Exam: BP 192/90  Pulse 60  Ht 6\' 1"  (1.854 m)  Wt 336 lb (152.409 kg)  BMI 44.33 kg/m2  Patient is very pleasant and in no acute distress. He is obese. Skin is warm and dry. Color is normal.  HEENT is unremarkable. Normocephalic/atraumatic. PERRL. Sclera are nonicteric. Neck is supple. No masses. No JVD. Lungs are clear. Cardiac exam shows a regular rate and rhythm. There is a positive S4. There is no murmur. Abdomen is soft and obese.  Extremities are without edema. Gait and ROM are intact. No gross neurologic deficits noted.  LABORATORY DATA:     Assessment / Plan:

## 2011-03-02 NOTE — Patient Instructions (Signed)
We will refill your amlodipine today.  Continue your other medications.  Get a blood pressure monitor and keep a diary of your readings. Bring with you on your next visit.  We will check lab work today.  Avoid all sodium (salt).  I will see you again in 4 months.

## 2011-03-02 NOTE — Telephone Encounter (Signed)
Pt is aware of lab results. Potassium was low. Per Dr. Martinique he will increase his potassium to 93meq bid. Repeat bmet in 2 weeks Horton Chin RN

## 2011-03-02 NOTE — Assessment & Plan Note (Signed)
No evidence of recurrence since his cardioversion in March. We'll continue to monitor.

## 2011-03-02 NOTE — Assessment & Plan Note (Signed)
Encouraged increased aerobic activity and weight loss.

## 2011-03-02 NOTE — Assessment & Plan Note (Signed)
He has run out of his amlodipine recently. We will return renew this. Again stressed importance of taking his medications. He needs to do better with sodium restriction. I do feel that his dry mouth and sleepiness is related to his medications but we need to continue these for now. We will check fasting lab work today.

## 2011-03-17 ENCOUNTER — Other Ambulatory Visit (INDEPENDENT_AMBULATORY_CARE_PROVIDER_SITE_OTHER): Payer: Medicare Other | Admitting: *Deleted

## 2011-03-17 DIAGNOSIS — E876 Hypokalemia: Secondary | ICD-10-CM

## 2011-03-17 LAB — BASIC METABOLIC PANEL
BUN: 16 mg/dL (ref 6–23)
Calcium: 9.3 mg/dL (ref 8.4–10.5)
GFR: 54.67 mL/min — ABNORMAL LOW (ref >60.00)
Glucose, Bld: 108 mg/dL — ABNORMAL HIGH (ref 70–99)
Sodium: 142 mEq/L (ref 135–145)

## 2011-03-18 ENCOUNTER — Other Ambulatory Visit: Payer: Self-pay | Admitting: Cardiology

## 2011-03-18 NOTE — Telephone Encounter (Signed)
Notified wife of lab results. Will send copy to Dr. Coletta Memos.

## 2011-03-18 NOTE — Telephone Encounter (Signed)
Message copied by Hetty Blend on Wed Mar 18, 2011  5:15 PM ------      Message from: Martinique, PETER M      Created: Tue Mar 17, 2011  4:58 PM       Potassium is much better. Continue current potassium dose.      [name] Martinique

## 2011-05-25 ENCOUNTER — Other Ambulatory Visit: Payer: Self-pay | Admitting: *Deleted

## 2011-05-25 ENCOUNTER — Other Ambulatory Visit: Payer: Self-pay | Admitting: Cardiology

## 2011-05-25 MED ORDER — PRAVASTATIN SODIUM 20 MG PO TABS: 20.0000 mg | ORAL_TABLET | Freq: Every day | ORAL | Status: DC

## 2011-05-25 NOTE — Telephone Encounter (Signed)
Refilled pravastatin. 

## 2011-05-25 NOTE — Telephone Encounter (Signed)
New Msg: pt said he has been out of medication for one month and needs RX called in ASAP.

## 2011-06-24 ENCOUNTER — Other Ambulatory Visit: Payer: Self-pay

## 2011-06-24 MED ORDER — METOPROLOL TARTRATE 25 MG PO TABS: 25.0000 mg | ORAL_TABLET | Freq: Two times a day (BID) | ORAL | Status: DC

## 2011-06-25 ENCOUNTER — Telehealth: Payer: Self-pay | Admitting: Cardiology

## 2011-06-25 NOTE — Telephone Encounter (Signed)
Pt needs refill of metformin

## 2011-06-26 NOTE — Telephone Encounter (Signed)
Refill by pcp, not dr.jordan. He needs to call his pcp

## 2011-07-01 ENCOUNTER — Ambulatory Visit: Payer: Medicare Other | Admitting: Cardiology

## 2011-07-03 ENCOUNTER — Ambulatory Visit (INDEPENDENT_AMBULATORY_CARE_PROVIDER_SITE_OTHER): Payer: Medicare Other | Admitting: Nurse Practitioner

## 2011-07-03 ENCOUNTER — Encounter: Payer: Self-pay | Admitting: Nurse Practitioner

## 2011-07-03 ENCOUNTER — Telehealth: Payer: Self-pay | Admitting: *Deleted

## 2011-07-03 VITALS — BP 148/98 | HR 60 | Ht 73.0 in | Wt 324.1 lb

## 2011-07-03 DIAGNOSIS — E669 Obesity, unspecified: Secondary | ICD-10-CM

## 2011-07-03 DIAGNOSIS — I4892 Unspecified atrial flutter: Secondary | ICD-10-CM

## 2011-07-03 DIAGNOSIS — E785 Hyperlipidemia, unspecified: Secondary | ICD-10-CM

## 2011-07-03 DIAGNOSIS — I10 Essential (primary) hypertension: Secondary | ICD-10-CM

## 2011-07-03 LAB — LIPID PANEL
Cholesterol: 154 mg/dL (ref 0–200)
HDL: 56.3 mg/dL (ref >39.00)
LDL Cholesterol: 83 mg/dL (ref 0–99)
Total CHOL/HDL Ratio: 3
Triglycerides: 76 mg/dL (ref 0.0–149.0)
VLDL: 15.2 mg/dL (ref 0.0–40.0)

## 2011-07-03 LAB — CBC WITH DIFFERENTIAL/PLATELET
Basophils Absolute: 0 10*3/uL (ref 0.0–0.1)
Basophils Relative: 0.8 % (ref 0.0–3.0)
Eosinophils Absolute: 0.1 10*3/uL (ref 0.0–0.7)
Eosinophils Relative: 2.8 % (ref 0.0–5.0)
HCT: 38.5 % — ABNORMAL LOW (ref 39.0–52.0)
Hemoglobin: 12.2 g/dL — ABNORMAL LOW (ref 13.0–17.0)
Lymphocytes Relative: 33.7 % (ref 12.0–46.0)
Lymphs Abs: 1.6 10*3/uL (ref 0.7–4.0)
MCHC: 31.7 g/dL (ref 30.0–36.0)
MCV: 73.1 fl — ABNORMAL LOW (ref 78.0–100.0)
Monocytes Absolute: 0.3 10*3/uL (ref 0.1–1.0)
Monocytes Relative: 6.9 % (ref 3.0–12.0)
Neutro Abs: 2.6 10*3/uL (ref 1.4–7.7)
Neutrophils Relative %: 55.8 % (ref 43.0–77.0)
Platelets: 248 10*3/uL (ref 150.0–400.0)
RBC: 5.27 Mil/uL (ref 4.22–5.81)
RDW: 15.9 % — ABNORMAL HIGH (ref 11.5–14.6)
WBC: 4.7 10*3/uL (ref 4.5–10.5)

## 2011-07-03 LAB — BASIC METABOLIC PANEL
BUN: 15 mg/dL (ref 6–23)
CO2: 34 mEq/L — ABNORMAL HIGH (ref 19–32)
Calcium: 9.4 mg/dL (ref 8.4–10.5)
Chloride: 97 mEq/L (ref 96–112)
Creatinine, Ser: 1.1 mg/dL (ref 0.4–1.5)
GFR: 73.26 mL/min (ref >60.00)
Glucose, Bld: 104 mg/dL — ABNORMAL HIGH (ref 70–99)
Potassium: 3.2 mEq/L — ABNORMAL LOW (ref 3.5–5.1)
Sodium: 139 mEq/L (ref 135–145)

## 2011-07-03 LAB — HEPATIC FUNCTION PANEL
ALT: 20 U/L (ref 0–53)
AST: 24 U/L (ref 0–37)
Albumin: 4.1 g/dL (ref 3.5–5.2)
Alkaline Phosphatase: 42 U/L (ref 39–117)
Bilirubin, Direct: 0.1 mg/dL (ref 0.0–0.3)
Total Bilirubin: 0.9 mg/dL (ref 0.3–1.2)
Total Protein: 7.4 g/dL (ref 6.0–8.3)

## 2011-07-03 MED ORDER — POTASSIUM CHLORIDE CRYS ER 20 MEQ PO TBCR: 20.0000 meq | EXTENDED_RELEASE_TABLET | Freq: Two times a day (BID) | ORAL | Status: DC

## 2011-07-03 MED ORDER — CLONIDINE HCL 0.2 MG PO TABS: 0.2000 mg | ORAL_TABLET | Freq: Every day | ORAL | Status: DC

## 2011-07-03 MED ORDER — CLONIDINE HCL 0.2 MG PO TABS
0.2000 mg | ORAL_TABLET | Freq: Every day | ORAL | Status: DC
Start: 2011-07-03 — End: 2011-07-03

## 2011-07-03 NOTE — Patient Instructions (Addendum)
We are going to recheck your labs today.   I want you to take 2 of your Clonidine 0.1 tabs at bedtime and use those up. The prescription for the 0.2mg  tab is at [facility]  I want to see you in a month  Continue to restrict your salt intake.   Continue with losing weight.   Call the [facility] office at [phone] if you have any questions, problems or concerns.

## 2011-07-03 NOTE — Assessment & Plan Note (Signed)
Blood pressure remains high. No medicines yet today. We are rechecking his labs today. He was a little anemic on his last CBC last year. I have increased the Clonidine to 0.2mg  QHS. I will see him back in a month. I have tried to stress to him the importance of good blood pressure control. Patient is agreeable to this plan and will call if any problems develop in the interim.
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
## 2011-07-03 NOTE — Assessment & Plan Note (Signed)
He has lost 9 pounds. Does not really know how this has happened. Will continue to monitor.

## 2011-07-03 NOTE — Telephone Encounter (Signed)
Message copied by Jonathon Jordan on Fri Jul 03, 2011  1:48 PM ------      Message from: Burtis Junes      Created: Fri Jul 03, 2011  1:43 PM       Ok to report. Labs are satisfactory. Needs to increase his potassium to BID. Recheck BMET on return visit. Patient of Dr. Doug Sou

## 2011-07-03 NOTE — Assessment & Plan Note (Signed)
He remains in sinus rhythm per exam today.

## 2011-07-03 NOTE — Telephone Encounter (Signed)
Pt called and will increase k+, results reviewed.

## 2011-08-03 ENCOUNTER — Other Ambulatory Visit: Payer: Self-pay

## 2011-08-03 ENCOUNTER — Ambulatory Visit (INDEPENDENT_AMBULATORY_CARE_PROVIDER_SITE_OTHER): Payer: Medicare Other | Admitting: Nurse Practitioner

## 2011-08-03 ENCOUNTER — Encounter: Payer: Self-pay | Admitting: Nurse Practitioner

## 2011-08-03 VITALS — BP 138/80 | HR 72 | Ht 73.0 in | Wt 318.0 lb

## 2011-08-03 DIAGNOSIS — E669 Obesity, unspecified: Secondary | ICD-10-CM

## 2011-08-03 DIAGNOSIS — I10 Essential (primary) hypertension: Secondary | ICD-10-CM

## 2011-08-03 DIAGNOSIS — I4892 Unspecified atrial flutter: Secondary | ICD-10-CM

## 2011-08-03 LAB — BASIC METABOLIC PANEL
BUN: 15 mg/dL (ref 6–23)
CO2: 29 mEq/L (ref 19–32)
Calcium: 8.6 mg/dL (ref 8.4–10.5)
Chloride: 101 mEq/L (ref 96–112)
Creatinine, Ser: 1.1 mg/dL (ref 0.4–1.5)
GFR: 68.08 mL/min (ref >60.00)
Glucose, Bld: 101 mg/dL — ABNORMAL HIGH (ref 70–99)
Potassium: 3.2 mEq/L — ABNORMAL LOW (ref 3.5–5.1)
Sodium: 139 mEq/L (ref 135–145)

## 2011-08-03 MED ORDER — POTASSIUM CHLORIDE CRYS ER 20 MEQ PO TBCR: 20.0000 meq | EXTENDED_RELEASE_TABLET | Freq: Two times a day (BID) | ORAL | Status: DC

## 2011-08-03 NOTE — Patient Instructions (Signed)
Your blood pressure looks better - down to 138/80!!  Stay on your current medicines  We are going to check your potassium level today.  We will see you in 4 months.  Call the [facility] office at [phone] if you have any questions, problems or concerns.

## 2011-08-03 NOTE — Assessment & Plan Note (Signed)
Blood pressure is much better. He feels good with his current regimen. We will recheck a BMET today to make sure his potassium is ok. The numbness in his fingers sounds more neuropathic in nature. We will plan on seeing him back in 4 months with fasting labs. Patient is agreeable to this plan and will call if any problems develop in the interim.

## 2011-08-03 NOTE — Progress Notes (Signed)
Nathan Grant Date of Birth: [birth year] Medical Record [id number]  History of Present Illness: Nathan Grant is seen back today for a one month check. He is seen for Dr. Martinique. He has HTN, obesity, and prior atrial fib that required cardioversion. He was noted to be hypertensive at his last visit. He had cut out his morning dose of clonidine due to feeling to sleepy while driving. We changed his dose to nighttime administration. Salt use has always remained an issue.  He comes in today. He is here alone. Says he is feeling good. He does not check his blood pressure at home. Doesn't have a cuff. No chest pain. Not short of breath. Feels like his rhythm has been ok. Trying to watch the salt. Has intentionally lost weight and is down 6 pounds.  Current Outpatient Prescriptions on File Prior to Visit  Medication Sig Dispense Refill  . amLODipine (NORVASC) 10 MG tablet Take 1 tablet (10 mg total) by mouth daily.  30 tablet  11  . aspirin 81 MG tablet Take 81 mg by mouth daily.        . fish oil-omega-3 fatty acids 1000 MG capsule Take 1 g by mouth daily.        . furosemide (LASIX) 20 MG tablet Take 20 mg by mouth every other day.        . losartan-hydrochlorothiazide (HYZAAR) 100-25 MG per tablet Take 1 tablet by mouth daily.  30 tablet  11  . metoprolol tartrate (LOPRESSOR) 25 MG tablet Take 1 tablet (25 mg total) by mouth 2 (two) times daily.  60 tablet  2  . multivitamin (THERAGRAN) per tablet Take 1 tablet by mouth daily.        . pravastatin (PRAVACHOL) 20 MG tablet Take 1 tablet (20 mg total) by mouth daily.  30 tablet  5  . Tadalafil (CIALIS PO) Take by mouth as needed.        [name] DISCONTD: cloNIDine (CATAPRES) 0.2 MG tablet Take 1 tablet (0.2 mg total) by mouth at bedtime.  30 tablet  6  . DISCONTD: potassium chloride SA (KLOR-CON M20) 20 MEQ tablet Take 1 tablet (20 mEq total) by mouth 2 (two) times daily.  60 tablet  5    No Known Allergies  Past Medical History  Diagnosis Date  .  Atrial flutter     s/p cardioversion 06/2010  . Chronic anticoagulation   . HTN (hypertension)   . Morbid obesity   . Osteoarthritis   . Degenerative joint disease      End-stage degenerative joint disease, left knee  . Anemia   . Acute renal insufficiency   . Tobacco chew use      History of chewing tobacco usage.   . Acute renal failure      Acute renal failure due to postoperative anemia  . Volume depletion      Lasix and ACE inhibitor  . Hypokalemia   . Urinary tract infection      Pseudomonas urinary tract infection  . Obesity   . Microcytic anemia  03/22/2001  . Dizziness   . Chronic headaches   . Tendonitis   . Erectile dysfunction     Past Surgical History  Procedure Date  . Total knee arthroplasty May 2009    Bilateral  . Cardioversion 07/01/2010    History  Smoking status  . Never Smoker   Smokeless tobacco  . Current User  . Types: Chew    History  Alcohol Use No  Family History  Problem Relation Age of Onset  . Alcohol abuse Brother   . Hypertension Brother   . Hypertension Mother     Review of Systems: The review of systems is positive for numbness of his finger tips. This has been a chronic issue.  Not diabetic. All other systems were reviewed and are negative.  Physical Exam: BP 138/80  Pulse 72  Ht 6\' 1"  (1.854 m)  Wt 318 lb (144.244 kg)  BMI 41.96 kg/m2 Patient is very pleasant and in no acute distress. Weight is down but he remains obese. Skin is warm and dry. Color is normal.  HEENT is unremarkable. Normocephalic/atraumatic. PERRL. Sclera are nonicteric. Neck is supple. No masses. No JVD. Lungs are clear. Cardiac exam shows a regular rate and rhythm. Abdomen is obese but soft. Extremities are without edema. Pulses are 2+. Gait and ROM are intact. No gross neurologic deficits noted.   LABORATORY DATA: BMET is pending   Assessment / Plan:

## 2011-08-03 NOTE — Assessment & Plan Note (Signed)
Remains in sinus by physical exam.

## 2011-08-03 NOTE — Assessment & Plan Note (Signed)
He is down 6 pounds. Continued weight loss is encouraged.

## 2011-08-13 ENCOUNTER — Other Ambulatory Visit (INDEPENDENT_AMBULATORY_CARE_PROVIDER_SITE_OTHER): Payer: Medicare Other

## 2011-08-13 DIAGNOSIS — I10 Essential (primary) hypertension: Secondary | ICD-10-CM

## 2011-08-13 LAB — BASIC METABOLIC PANEL
BUN: 16 mg/dL (ref 6–23)
CO2: 32 mEq/L (ref 19–32)
Chloride: 99 mEq/L (ref 96–112)
Creatinine, Ser: 1.2 mg/dL (ref 0.4–1.5)

## 2011-08-18 ENCOUNTER — Other Ambulatory Visit: Payer: Self-pay | Admitting: *Deleted

## 2011-08-18 MED ORDER — FUROSEMIDE 20 MG PO TABS: 20.0000 mg | ORAL_TABLET | ORAL | Status: DC

## 2011-09-24 ENCOUNTER — Other Ambulatory Visit: Payer: Self-pay

## 2011-09-24 MED ORDER — LOSARTAN POTASSIUM-HCTZ 100-25 MG PO TABS: 1.0000 | ORAL_TABLET | Freq: Every day | ORAL | Status: DC

## 2011-10-12 ENCOUNTER — Telehealth: Payer: Self-pay | Admitting: Cardiology

## 2011-10-12 NOTE — Telephone Encounter (Signed)
Katelyn from [facility] called back was told to call patient's PCP.

## 2011-10-12 NOTE — Telephone Encounter (Signed)
New msg [facility] calling about back and knee brace. Please call

## 2011-10-26 ENCOUNTER — Telehealth: Payer: Self-pay | Admitting: Cardiology

## 2011-10-26 ENCOUNTER — Other Ambulatory Visit: Payer: Self-pay | Admitting: *Deleted

## 2011-10-26 MED ORDER — METOPROLOL TARTRATE 25 MG PO TABS: 25.0000 mg | ORAL_TABLET | Freq: Two times a day (BID) | ORAL | Status: DC

## 2011-10-26 NOTE — Telephone Encounter (Signed)
Pt needs appointment then refill can be made Fax Received. Refill Completed. Nathan Grant (R.M.A)   

## 2011-10-26 NOTE — Telephone Encounter (Signed)
Please return call to patient at [phone] regarding medication discussion.

## 2011-10-27 NOTE — Telephone Encounter (Signed)
Patient called stated he has been out of losartan for 1 month could not afford.States he has got it now and was wanting to know if he needs to start back.Patient advised restart losartan/hctz 100/25 mg daily.Stated he is having some swelling in his ankles.States has no swelling in the mornings.  Advised to continue all medication.Keep appointment with Dr.Jordan 12/03/11 and call back if swelling gets worse.

## 2011-11-02 ENCOUNTER — Telehealth: Payer: Self-pay | Admitting: Cardiology

## 2011-11-02 NOTE — Telephone Encounter (Signed)
New msg [facility] calling back about request that was sent

## 2011-11-03 NOTE — Telephone Encounter (Signed)
[facility] called already closed .Left message on voice mail needs to call PCP about diabetic supplies.

## 2011-11-18 ENCOUNTER — Other Ambulatory Visit: Payer: Self-pay

## 2011-12-03 ENCOUNTER — Ambulatory Visit: Payer: Medicare Other | Admitting: Cardiology

## 2011-12-18 ENCOUNTER — Encounter: Payer: Self-pay | Admitting: Cardiology

## 2011-12-23 ENCOUNTER — Other Ambulatory Visit: Payer: Self-pay | Admitting: Cardiology

## 2011-12-23 DIAGNOSIS — I10 Essential (primary) hypertension: Secondary | ICD-10-CM

## 2011-12-28 MED ORDER — OMEGA-3 FATTY ACIDS 1000 MG PO CAPS: 1.0000 g | ORAL_CAPSULE | Freq: Every day | ORAL | Status: DC

## 2011-12-28 MED ORDER — PRAVASTATIN SODIUM 20 MG PO TABS: 20.0000 mg | ORAL_TABLET | Freq: Every day | ORAL | Status: DC

## 2011-12-28 MED ORDER — LOSARTAN POTASSIUM-HCTZ 100-25 MG PO TABS: 1.0000 | ORAL_TABLET | Freq: Every day | ORAL | Status: DC

## 2011-12-28 MED ORDER — METOPROLOL TARTRATE 25 MG PO TABS: 25.0000 mg | ORAL_TABLET | Freq: Two times a day (BID) | ORAL | Status: DC

## 2011-12-28 MED ORDER — AMLODIPINE BESYLATE 10 MG PO TABS: 10.0000 mg | ORAL_TABLET | Freq: Every day | ORAL | Status: DC

## 2011-12-28 MED ORDER — FUROSEMIDE 20 MG PO TABS: 20.0000 mg | ORAL_TABLET | ORAL | Status: DC

## 2011-12-28 MED ORDER — ASPIRIN 81 MG PO TABS: 81.0000 mg | ORAL_TABLET | Freq: Every day | ORAL | Status: DC

## 2012-01-05 ENCOUNTER — Encounter: Payer: Self-pay | Admitting: Cardiology

## 2012-01-05 ENCOUNTER — Ambulatory Visit: Payer: Medicare Other | Admitting: Cardiology

## 2012-01-05 ENCOUNTER — Other Ambulatory Visit: Payer: Medicare Other

## 2012-01-05 ENCOUNTER — Ambulatory Visit (INDEPENDENT_AMBULATORY_CARE_PROVIDER_SITE_OTHER): Payer: Medicare Other | Admitting: Cardiology

## 2012-01-05 VITALS — BP 140/80 | HR 51 | Ht 73.0 in | Wt 336.0 lb

## 2012-01-05 DIAGNOSIS — I4892 Unspecified atrial flutter: Secondary | ICD-10-CM

## 2012-01-05 DIAGNOSIS — I10 Essential (primary) hypertension: Secondary | ICD-10-CM

## 2012-01-05 DIAGNOSIS — E669 Obesity, unspecified: Secondary | ICD-10-CM

## 2012-01-05 NOTE — Patient Instructions (Signed)
Continue your current therapy.  Avoid salt (sodium), Reduce carbohydrate intake especially sweets.  You need to walk.  I will see you again in months.

## 2012-01-05 NOTE — Progress Notes (Signed)
Nathan Grant Date of Birth: 10/29/1945   History of Present Illness: Nathan Grant is seen for followup of his hypertension and atrial fibrillation. He denies any recurrent palpitations. He overall feels well. He doesn't really check his blood pressure at home. He thinks that it is better. He reports that his metoprolol was discontinued by his primary care because of ankle swelling. He states that the swelling has improved significantly. He has gained about 16 pounds since his last visit. He reports that he is trying to watch his salt intake. His wife does most of the cooking. He doesn't really do any regular exercise. He eats a fair amount of potatoes and sweets.  Current Outpatient Prescriptions on File Prior to Visit  Medication Sig Dispense Refill  . amLODipine (NORVASC) 10 MG tablet Take 1 tablet (10 mg total) by mouth daily.  30 tablet  11  . aspirin 81 MG tablet Take 1 tablet (81 mg total) by mouth daily.  30 tablet  0  . cloNIDine (CATAPRES) 0.2 MG tablet Take 0.2 mg by mouth every other day.      . fish oil-omega-3 fatty acids 1000 MG capsule Take 1 capsule (1 g total) by mouth daily.  30 capsule  11  . furosemide (LASIX) 20 MG tablet Take 1 tablet (20 mg total) by mouth every other day.  30 tablet  6  . losartan-hydrochlorothiazide (HYZAAR) 100-25 MG per tablet Take 1 tablet by mouth daily.  30 tablet  11  . multivitamin (THERAGRAN) per tablet Take 1 tablet by mouth daily.        . potassium chloride SA (K-DUR,KLOR-CON) 20 MEQ tablet Take 1 tablet (20 mEq total) by mouth 2 (two) times daily.  60 tablet  11  . pravastatin (PRAVACHOL) 20 MG tablet Take 1 tablet (20 mg total) by mouth daily.  30 tablet  5  . Tadalafil (CIALIS PO) Take by mouth as needed.        [name] DISCONTD: potassium chloride SA (K-DUR,KLOR-CON) 20 MEQ tablet Take 20 mEq by mouth daily.        No Known Allergies  Past Medical History  Diagnosis Date  . Atrial flutter     s/p cardioversion 06/2010  . Chronic  anticoagulation   . HTN (hypertension)   . Morbid obesity   . Osteoarthritis   . Degenerative joint disease      End-stage degenerative joint disease, left knee  . Anemia   . Acute renal insufficiency   . Tobacco chew use      History of chewing tobacco usage.   . Acute renal failure      Acute renal failure due to postoperative anemia  . Volume depletion      Lasix and ACE inhibitor  . Hypokalemia   . Urinary tract infection      Pseudomonas urinary tract infection  . Obesity   . Microcytic anemia  03/22/2001  . Dizziness   . Chronic headaches   . Tendonitis   . Erectile dysfunction     Past Surgical History  Procedure Date  . Total knee arthroplasty May 2009    Bilateral  . Cardioversion 07/01/2010    History  Smoking status  . Never Smoker   Smokeless tobacco  . Current User  . Types: Chew    History  Alcohol Use No    Family History  Problem Relation Age of Onset  . Alcohol abuse Brother   . Hypertension Brother   . Hypertension Mother  Review of Systems: As noted in HPI. All other systems were reviewed and are negative.  Physical Exam: BP 140/80  Pulse 51  Ht 6\' 1"  (1.854 m)  Wt 152.409 kg (336 lb)  BMI 44.33 kg/m2  SpO2 96% Patient is an obese black male no acute distress. He is obese. Skin is warm and dry. Color is normal.  HEENT is unremarkable. Normocephalic/atraumatic. PERRL. Sclera are nonicteric. Neck is supple. No masses. No JVD. Lungs are clear. Cardiac exam shows a regular rate and rhythm. There is a positive S4. There is no murmur. Abdomen is soft and obese.  Extremities are without edema. Gait and ROM are intact. No gross neurologic deficits noted.  LABORATORY DATA:     Assessment / Plan: 1. Hypertension-appears to be under fairly good control today. I reinforced the need for sodium restriction. We'll continue with amlodipine, Hyzaar, and furosemide. He is also on clonidine. I will followup again in 6 months.  2. Atrial  fibrillation/flutter. Status post cardioversion in March of 2012. No evidence of recurrence.  3. Morbid obesity. We discussed appropriate dietary modifications for weight loss including restriction of carbohydrates and sweets. He needs to increase his aerobic activity with walking 30 minutes daily.

## 2012-01-26 ENCOUNTER — Other Ambulatory Visit: Payer: Self-pay | Admitting: Cardiology

## 2012-01-26 NOTE — Telephone Encounter (Signed)
New problem:  Wants to know why pharmacy only gave him 30 pills. Instead of 60 pills.

## 2012-02-01 MED ORDER — CLONIDINE HCL 0.2 MG PO TABS: 0.2000 mg | ORAL_TABLET | ORAL | Status: DC

## 2012-02-01 NOTE — Telephone Encounter (Signed)
Spoke to patient 01/29/12 and he decided he only needs 30 tablets of isosorbide.

## 2012-02-17 ENCOUNTER — Ambulatory Visit: Payer: Medicare Other | Admitting: Cardiology

## 2012-05-29 ENCOUNTER — Emergency Department (HOSPITAL_COMMUNITY): Payer: Medicare Other

## 2012-05-29 ENCOUNTER — Encounter (HOSPITAL_COMMUNITY): Payer: Self-pay | Admitting: *Deleted

## 2012-05-29 ENCOUNTER — Inpatient Hospital Stay (HOSPITAL_COMMUNITY)
Admission: EM | Admit: 2012-05-29 | Discharge: 2012-05-31 | DRG: 305 | Disposition: A | Discharge status: Home / Self Care | Payer: Medicare Other | Attending: Internal Medicine | Admitting: Internal Medicine

## 2012-05-29 DIAGNOSIS — Z79899 Other long term (current) drug therapy: Secondary | ICD-10-CM

## 2012-05-29 DIAGNOSIS — I4892 Unspecified atrial flutter: Secondary | ICD-10-CM | POA: Diagnosis present

## 2012-05-29 DIAGNOSIS — Z7982 Long term (current) use of aspirin: Secondary | ICD-10-CM

## 2012-05-29 DIAGNOSIS — I1310 Hypertensive heart and chronic kidney disease without heart failure, with stage 1 through stage 4 chronic kidney disease, or unspecified chronic kidney disease: Principal | ICD-10-CM | POA: Diagnosis present

## 2012-05-29 DIAGNOSIS — R7309 Other abnormal glucose: Secondary | ICD-10-CM | POA: Diagnosis present

## 2012-05-29 DIAGNOSIS — M1990 Unspecified osteoarthritis, unspecified site: Secondary | ICD-10-CM | POA: Insufficient documentation

## 2012-05-29 DIAGNOSIS — N189 Chronic kidney disease, unspecified: Secondary | ICD-10-CM | POA: Diagnosis present

## 2012-05-29 DIAGNOSIS — D509 Iron deficiency anemia, unspecified: Secondary | ICD-10-CM | POA: Diagnosis present

## 2012-05-29 DIAGNOSIS — I498 Other specified cardiac arrhythmias: Secondary | ICD-10-CM | POA: Diagnosis present

## 2012-05-29 DIAGNOSIS — E785 Hyperlipidemia, unspecified: Secondary | ICD-10-CM | POA: Diagnosis present

## 2012-05-29 DIAGNOSIS — N179 Acute kidney failure, unspecified: Secondary | ICD-10-CM | POA: Diagnosis present

## 2012-05-29 DIAGNOSIS — Z96659 Presence of unspecified artificial knee joint: Secondary | ICD-10-CM

## 2012-05-29 DIAGNOSIS — I10 Essential (primary) hypertension: Secondary | ICD-10-CM

## 2012-05-29 DIAGNOSIS — I519 Heart disease, unspecified: Secondary | ICD-10-CM | POA: Diagnosis present

## 2012-05-29 DIAGNOSIS — G4733 Obstructive sleep apnea (adult) (pediatric): Secondary | ICD-10-CM | POA: Diagnosis present

## 2012-05-29 DIAGNOSIS — I119 Hypertensive heart disease without heart failure: Secondary | ICD-10-CM

## 2012-05-29 DIAGNOSIS — N529 Male erectile dysfunction, unspecified: Secondary | ICD-10-CM | POA: Diagnosis present

## 2012-05-29 DIAGNOSIS — R51 Headache: Secondary | ICD-10-CM | POA: Diagnosis present

## 2012-05-29 DIAGNOSIS — Z6841 Body Mass Index (BMI) 40.0 and over, adult: Secondary | ICD-10-CM

## 2012-05-29 DIAGNOSIS — I200 Unstable angina: Secondary | ICD-10-CM | POA: Diagnosis present

## 2012-05-29 DIAGNOSIS — G8929 Other chronic pain: Secondary | ICD-10-CM | POA: Diagnosis present

## 2012-05-29 DIAGNOSIS — E876 Hypokalemia: Secondary | ICD-10-CM | POA: Diagnosis present

## 2012-05-29 LAB — COMPREHENSIVE METABOLIC PANEL
Alkaline Phosphatase: 55 U/L (ref 39–117)
BUN: 14 mg/dL (ref 6–23)
Calcium: 9.5 mg/dL (ref 8.4–10.5)
Creatinine, Ser: 1.28 mg/dL (ref 0.50–1.35)
GFR calc Af Amer: 65 mL/min — ABNORMAL LOW (ref >90)
Glucose, Bld: 126 mg/dL — ABNORMAL HIGH (ref 70–99)
Total Protein: 7.3 g/dL (ref 6.0–8.3)

## 2012-05-29 LAB — CBC WITH DIFFERENTIAL/PLATELET
Eosinophils Absolute: 0.2 10*3/uL (ref 0.0–0.7)
Eosinophils Relative: 3 % (ref 0–5)
Hemoglobin: 12.7 g/dL — ABNORMAL LOW (ref 13.0–17.0)
Lymphs Abs: 1.6 10*3/uL (ref 0.7–4.0)
MCH: 23.1 pg — ABNORMAL LOW (ref 26.0–34.0)
MCHC: 32.7 g/dL (ref 30.0–36.0)
MCV: 70.5 fL — ABNORMAL LOW (ref 78.0–100.0)
Monocytes Absolute: 0.4 10*3/uL (ref 0.1–1.0)
Monocytes Relative: 7 % (ref 3–12)
RBC: 5.5 MIL/uL (ref 4.22–5.81)

## 2012-05-29 LAB — HEPARIN LEVEL (UNFRACTIONATED): Heparin Unfractionated: 0.41 IU/mL (ref 0.30–0.70)

## 2012-05-29 LAB — D-DIMER, QUANTITATIVE: D-Dimer, Quant: 0.39 ug/mL-FEU (ref 0.00–0.48)

## 2012-05-29 LAB — TROPONIN I
Troponin I: <0.3 ng/mL (ref <0.30)
Troponin I: <0.3 ng/mL (ref <0.30)

## 2012-05-29 LAB — PRO B NATRIURETIC PEPTIDE: Pro B Natriuretic peptide (BNP): 1249 pg/mL — ABNORMAL HIGH (ref 0–125)

## 2012-05-29 MED ORDER — ASPIRIN 325 MG PO TABS
325.0000 mg | ORAL_TABLET | Freq: Once | ORAL | Status: AC
  Administered 2012-05-29: 325 mg via ORAL
  Filled 2012-05-29: qty 1

## 2012-05-29 MED ORDER — OMEGA-3-ACID ETHYL ESTERS 1 G PO CAPS
1.0000 g | ORAL_CAPSULE | Freq: Two times a day (BID) | ORAL | Status: DC
  Administered 2012-05-29 – 2012-05-31 (×5): 1 g via ORAL
  Filled 2012-05-29 (×6): qty 1

## 2012-05-29 MED ORDER — AMLODIPINE BESYLATE 10 MG PO TABS
10.0000 mg | ORAL_TABLET | Freq: Every day | ORAL | Status: DC
  Administered 2012-05-29 – 2012-05-31 (×3): 10 mg via ORAL
  Filled 2012-05-29 (×3): qty 1

## 2012-05-29 MED ORDER — HEPARIN BOLUS VIA INFUSION
4000.0000 [IU] | Freq: Once | INTRAVENOUS | Status: AC
  Administered 2012-05-29 (×2): 4000 [IU] via INTRAVENOUS

## 2012-05-29 MED ORDER — SODIUM CHLORIDE 0.9 % IJ SOLN
3.0000 mL | Freq: Two times a day (BID) | INTRAMUSCULAR | Status: DC
  Administered 2012-05-29 – 2012-05-31 (×3): 3 mL via INTRAVENOUS

## 2012-05-29 MED ORDER — ONDANSETRON HCL 4 MG PO TABS: 4.0000 mg | ORAL_TABLET | Freq: Four times a day (QID) | ORAL | Status: DC | PRN

## 2012-05-29 MED ORDER — NITROGLYCERIN IN D5W 200-5 MCG/ML-% IV SOLN
2.0000 ug/min | Freq: Once | INTRAVENOUS | Status: AC
  Administered 2012-05-29: 5 ug/min via INTRAVENOUS
  Filled 2012-05-29: qty 250

## 2012-05-29 MED ORDER — LOSARTAN POTASSIUM 50 MG PO TABS
100.0000 mg | ORAL_TABLET | Freq: Every day | ORAL | Status: DC
  Administered 2012-05-29 – 2012-05-31 (×3): 100 mg via ORAL
  Filled 2012-05-29 (×3): qty 2

## 2012-05-29 MED ORDER — LABETALOL HCL 5 MG/ML IV SOLN
10.0000 mg | Freq: Once | INTRAVENOUS | Status: AC
  Administered 2012-05-29: 10 mg via INTRAVENOUS
  Filled 2012-05-29: qty 4

## 2012-05-29 MED ORDER — SIMVASTATIN 40 MG PO TABS
40.0000 mg | ORAL_TABLET | Freq: Every day | ORAL | Status: DC
  Filled 2012-05-29: qty 1

## 2012-05-29 MED ORDER — ATORVASTATIN CALCIUM 20 MG PO TABS
20.0000 mg | ORAL_TABLET | Freq: Every day | ORAL | Status: DC
  Administered 2012-05-29: 20 mg via ORAL
  Filled 2012-05-29 (×3): qty 1

## 2012-05-29 MED ORDER — ONDANSETRON HCL 4 MG/2ML IJ SOLN: 4.0000 mg | Freq: Four times a day (QID) | INTRAMUSCULAR | Status: DC | PRN

## 2012-05-29 MED ORDER — OMEGA-3 FATTY ACIDS 1000 MG PO CAPS: 1.0000 g | ORAL_CAPSULE | Freq: Two times a day (BID) | ORAL | Status: DC

## 2012-05-29 MED ORDER — HYDROCHLOROTHIAZIDE 25 MG PO TABS
25.0000 mg | ORAL_TABLET | Freq: Every day | ORAL | Status: DC
  Administered 2012-05-29 – 2012-05-31 (×3): 25 mg via ORAL
  Filled 2012-05-29 (×3): qty 1

## 2012-05-29 MED ORDER — ASPIRIN 81 MG PO TABS: 81.0000 mg | ORAL_TABLET | Freq: Every day | ORAL | Status: DC

## 2012-05-29 MED ORDER — ACETAMINOPHEN 650 MG RE SUPP: 650.0000 mg | Freq: Four times a day (QID) | RECTAL | Status: DC | PRN

## 2012-05-29 MED ORDER — ASPIRIN 81 MG PO CHEW
81.0000 mg | CHEWABLE_TABLET | Freq: Every day | ORAL | Status: DC
  Administered 2012-05-29 – 2012-05-31 (×3): 81 mg via ORAL
  Filled 2012-05-29 (×3): qty 1

## 2012-05-29 MED ORDER — CLONIDINE HCL 0.1 MG PO TABS
0.1000 mg | ORAL_TABLET | Freq: Two times a day (BID) | ORAL | Status: DC
  Administered 2012-05-29 – 2012-05-31 (×5): 0.1 mg via ORAL
  Filled 2012-05-29 (×6): qty 1

## 2012-05-29 MED ORDER — LOSARTAN POTASSIUM-HCTZ 100-25 MG PO TABS: 1.0000 | ORAL_TABLET | Freq: Every day | ORAL | Status: DC

## 2012-05-29 MED ORDER — POTASSIUM CHLORIDE CRYS ER 20 MEQ PO TBCR
20.0000 meq | EXTENDED_RELEASE_TABLET | Freq: Two times a day (BID) | ORAL | Status: DC
  Administered 2012-05-29 – 2012-05-31 (×5): 20 meq via ORAL
  Filled 2012-05-29 (×6): qty 1

## 2012-05-29 MED ORDER — ACETAMINOPHEN 325 MG PO TABS: 650.0000 mg | ORAL_TABLET | Freq: Four times a day (QID) | ORAL | Status: DC | PRN

## 2012-05-29 MED ORDER — ALUM & MAG HYDROXIDE-SIMETH 200-200-20 MG/5ML PO SUSP: 30.0000 mL | Freq: Four times a day (QID) | ORAL | Status: DC | PRN

## 2012-05-29 MED ORDER — ZOLPIDEM TARTRATE 5 MG PO TABS: 5.0000 mg | ORAL_TABLET | Freq: Every evening | ORAL | Status: DC | PRN

## 2012-05-29 MED ORDER — HEPARIN (PORCINE) IN NACL 100-0.45 UNIT/ML-% IJ SOLN
1500.0000 [IU]/h | INTRAMUSCULAR | Status: DC
  Administered 2012-05-29 (×2): 1500 [IU]/h via INTRAVENOUS
  Filled 2012-05-29 (×3): qty 250

## 2012-05-29 MED ORDER — METOPROLOL TARTRATE 25 MG PO TABS
25.0000 mg | ORAL_TABLET | Freq: Two times a day (BID) | ORAL | Status: DC
  Administered 2012-05-29 (×2): 25 mg via ORAL
  Filled 2012-05-29 (×4): qty 1

## 2012-05-29 NOTE — ED Provider Notes (Addendum)
History     CSN: [id number]  Arrival date & time 05/29/12  0248   First MD Initiated Contact with Patient 05/29/12 0309      Chief Complaint  Patient presents with  . Chest Pain    (Consider location/radiation/quality/duration/timing/severity/associated sxs/prior treatment) HPI The patient reports developing chest pain over the past 2-3 days.  He reports he has exertional chest tightness in the center of his chest without radiation.  He makes him short of breath when this occurs.  His pain improves with rest.  This is something new these never experienced before.  This continues to happen every time he ambulates including simple ambulation to the restroom in his house.  He is hypertensive on arrival of emergency department as well.  No history of pulmonary embolism.  No history of coronary artery disease.  He has a history of paroxysmal atrial fibrillation his had cardioversion before in the past for atrial fibrillation.  He is followed by Dr. Peter Martinique.  He denies unilateral leg swelling.  No nausea vomiting or diarrhea.  No melena or hematochezia.  His symptoms are mild to moderate in severity when they occur.  Currently he is without any active chest pain.  Past Medical History  Diagnosis Date  . Atrial flutter     s/p cardioversion 06/2010  . Chronic anticoagulation   . HTN (hypertension)   . Morbid obesity   . Osteoarthritis   . Degenerative joint disease      End-stage degenerative joint disease, left knee  . Anemia   . Acute renal insufficiency   . Tobacco chew use      History of chewing tobacco usage.   . Acute renal failure      Acute renal failure due to postoperative anemia  . Volume depletion      Lasix and ACE inhibitor  . Hypokalemia   . Urinary tract infection      Pseudomonas urinary tract infection  . Obesity   . Microcytic anemia  03/22/2001  . Dizziness   . Chronic headaches   . Tendonitis   . Erectile dysfunction     Past Surgical History   Procedure Laterality Date  . Total knee arthroplasty  May 2009    Bilateral  . Cardioversion  07/01/2010    Family History  Problem Relation Age of Onset  . Alcohol abuse Brother   . Hypertension Brother   . Hypertension Mother     History  Substance Use Topics  . Smoking status: Never Smoker   . Smokeless tobacco: Current User    Types: Chew  . Alcohol Use: No      Review of Systems  All other systems reviewed and are negative.    Allergies  Review of patient's allergies indicates no known allergies.  Home Medications   Current Outpatient Rx  Name  Route  Sig  Dispense  Refill  . amLODipine (NORVASC) 10 MG tablet   Oral   Take 1 tablet (10 mg total) by mouth daily.   30 tablet   11   . aspirin 81 MG tablet   Oral   Take 1 tablet (81 mg total) by mouth daily.   30 tablet   0   . cloNIDine (CATAPRES) 0.2 MG tablet   Oral   Take 1 tablet (0.2 mg total) by mouth every other day.   30 tablet   6   . fish oil-omega-3 fatty acids 1000 MG capsule   Oral   Take 1 g  by mouth 2 (two) times daily.         . furosemide (LASIX) 20 MG tablet   Oral   Take 1 tablet (20 mg total) by mouth every other day.   30 tablet   6   . losartan-hydrochlorothiazide (HYZAAR) 100-25 MG per tablet   Oral   Take 1 tablet by mouth daily.   30 tablet   11   . metoprolol tartrate (LOPRESSOR) 25 MG tablet   Oral   Take 25 mg by mouth 2 (two) times daily.         . multivitamin (THERAGRAN) per tablet   Oral   Take 1 tablet by mouth daily.           . potassium chloride SA (K-DUR,KLOR-CON) 20 MEQ tablet   Oral   Take 1 tablet (20 mEq total) by mouth 2 (two) times daily.   60 tablet   11   . pravastatin (PRAVACHOL) 20 MG tablet   Oral   Take 1 tablet (20 mg total) by mouth daily.   30 tablet   5   . Tadalafil (CIALIS PO)   Oral   Take 5 mg by mouth as needed (for ED).            BP 158/86  Pulse 89  Temp(Src) 98 F (36.7 C) (Oral)  Resp 19  SpO2  96%  Physical Exam  Nursing note and vitals reviewed. Constitutional: He is oriented to person, place, and time. He appears well-developed and well-nourished.  HENT:  Head: Normocephalic and atraumatic.  Eyes: EOM are normal.  Neck: Normal range of motion.  Cardiovascular: Normal rate, regular rhythm, normal heart sounds and intact distal pulses.   Pulmonary/Chest: Effort normal and breath sounds normal. No respiratory distress.  Abdominal: Soft. He exhibits no distension. There is no tenderness.  Musculoskeletal: Normal range of motion.  Neurological: He is alert and oriented to person, place, and time.  Skin: Skin is warm and dry.  Psychiatric: He has a normal mood and affect. Judgment normal.    ED Course  Procedures (including critical care time)   Date: 05/29/2012  Rate: 98  Rhythm: normal sinus rhythm  QRS Axis: normal  Intervals: normal  ST/T Wave abnormalities: normal  Conduction Disutrbances: none  Narrative Interpretation:   Old EKG Reviewed: Lateral T wave inversions longer present since last EKG in March 2012  CRITICAL CARE Performed by: Hoy Morn Total critical care time: 30 Critical care time was exclusive of separately billable procedures and treating other patients. Critical care was necessary to treat or prevent imminent or life-threatening deterioration. Critical care was time spent personally by me on the following activities: development of treatment plan with patient and/or surrogate as well as nursing, discussions with consultants, evaluation of patient's response to treatment, examination of patient, obtaining history from patient or surrogate, ordering and performing treatments and interventions, ordering and review of laboratory studies, ordering and review of radiographic studies, pulse oximetry and re-evaluation of patient's condition.    Labs Reviewed  CBC WITH DIFFERENTIAL - Abnormal; Notable for the following:    Hemoglobin 12.7 (*)    HCT  38.8 (*)    MCV 70.5 (*)    MCH 23.1 (*)    All other components within normal limits  COMPREHENSIVE METABOLIC PANEL - Abnormal; Notable for the following:    Potassium 3.2 (*)    Chloride 94 (*)    CO2 36 (*)    Glucose, Bld 126 (*)  GFR calc non Af Amer 56 (*)    GFR calc Af Amer 65 (*)    All other components within normal limits  PRO B NATRIURETIC PEPTIDE - Abnormal; Notable for the following:    Pro B Natriuretic peptide (BNP) 1249.0 (*)    All other components within normal limits  TROPONIN I  D-DIMER, QUANTITATIVE  TROPONIN I  HEPARIN LEVEL (UNFRACTIONATED)   Dg Chest 2 View  05/29/2012  *RADIOLOGY REPORT*  Clinical Data: Chest pain and shortness of breath.  CHEST - 2 VIEW  Comparison: Chest radiograph performed 05/30/2010  Findings: The lungs are well-aerated and clear.  There is no evidence of focal opacification, pleural effusion or pneumothorax.  The heart is mildly enlarged.  No acute osseous abnormalities are seen.  IMPRESSION: No acute cardiopulmonary process seen; mild cardiomegaly.   Original Report Authenticated By: Santa Lighter, M.D.      1. Unstable angina       MDM  Historians concerning for unstable angina.  I started the patient on a heparin drip and nitro drip.  EKG and troponin normal.  No active chest pain this time.  I discussed his case with cardiology Dr. Golden Hurter who recommended admission to the hospitalist service.  The patient was admitted to the hospital under [facility]ad hospitalist.  I think the patient will benefit from an aggressive cardiac workup including heart catheterization.  I strongly suggest cardiology consultation in the hospital.  We'll continue to monitor the patient closely in the emergency department.        Hoy Morn, MD 05/29/12 [place], MD 05/29/12 [phone]

## 2012-05-29 NOTE — ED Notes (Signed)
IV team notified that pt now has a line.

## 2012-05-29 NOTE — ED Notes (Signed)
Per EDP, pt can eat. Pt given happy meal.

## 2012-05-29 NOTE — Progress Notes (Signed)
  Echocardiogram 2D Echocardiogram has been performed.  Nathan Grant 05/29/2012, 3:53 PM

## 2012-05-29 NOTE — ED Notes (Signed)
The pt has had chest pain for 2-3 days .  He has felt his heart beating fast and he has been very nervous

## 2012-05-29 NOTE — Consult Note (Signed)
Cardiology Consult Note  Admit date: 05/29/2012 Name: Nathan Grant 67 y.o.  male DOB:  1945/04/19 MRN:  [id number]  Today's date:  05/29/2012  Referring Physician:  [facility]  Primary Physician: Bernerd Limbo  Reason for Consultation:  Chest discomfort  IMPRESSIONS: 1. Chest discomfort with atypical features associated with a normal EKG but with some features suggestive of unstable angina pectoris. This may in part be due to beta blocker and clonidine withdrawal with significant hypertension. 2. Hypertensive heart disease 3. Morbid obesity 4. Hyperlipidemia under treatment 5. Severe osteoarthritis 6. Remote history of atrial flutter with previous cardioversion in 2012 7. Elevation of BNP that may represent diastolic dysfunction due to chronic hypertensive heart disease  RECOMMENDATION: He was feeling fine prior to abruptly stopping his clonidine and beta blocker. His chest pain syndrome may be consistent with unstable angina but his enzymes are negative and his EKG is normal. This always preceded by abrupt cessation of his medication.  I will keep him n.p.o. after midnight and let Dr. Martinique decide if he wants to do a noninvasive nuclear test versus cardiac catheterization. Continue to cycle cardiac enzymes. Restart beta blockers and clonidine.  HISTORY: This 67 year old male has a history of morbid obesity, hypertension, hyperlipidemia and a remote history of atrial flutter with the previous cardioversion. He recently had his metoprolol increased by his primary physician and stated that he was feeling fairly well. He ran out of his medications about 3 days ago and stated that he did not have money to get them filled and has been without clonidine and metoprolol. After 2 days he began to complain of a sharp chest pain with activity as if he was eating and took baking soda yesterday that resulted in relief of the sharp chest pain but then left with a dull chest pain. He presented  to the emergency room this morning with the chest discomfort and states it happened every time he would ambulate including going to the restroom or simple activities. He was quite hypertensive on arrival to the emergency room. He was started on heparin and nitroglycerin. His EKG was normal and he is currently pain-free. He is still mildly hypertensive. Prior to running out of his medications he was feeling well without any chest discomfort. He denies PND, or orthopnea.  Past Medical History  Diagnosis Date  . Atrial flutter     s/p cardioversion 06/2010  . HTN (hypertension)   . Morbid obesity   . Osteoarthritis   . Degenerative joint disease      End-stage degenerative joint disease, left knee  . Anemia   . Tobacco chew use      History of chewing tobacco usage.   . Urinary tract infection      Pseudomonas urinary tract infection  . Obesity   . Microcytic anemia  03/22/2001  . Chronic headaches   . Tendonitis   . Erectile dysfunction       Past Surgical History  Procedure Laterality Date  . Total knee arthroplasty  May 2009    Bilateral  . Cardioversion  07/01/2010    Allergies:  has No Known Allergies.   Medications: Prior to Admission medications   Medication Sig Start Date End Date Taking? Authorizing Provider  amLODipine (NORVASC) 10 MG tablet Take 1 tablet (10 mg total) by mouth daily. 12/23/11  Yes Burtis Junes, NP  aspirin 81 MG tablet Take 1 tablet (81 mg total) by mouth daily. 12/23/11  Yes Burtis Junes, NP  cloNIDine (CATAPRES)  0.2 MG tablet Take 1 tablet (0.2 mg total) by mouth every other day. 01/26/12 01/25/13 Yes Peter M Martinique, MD  fish oil-omega-3 fatty acids 1000 MG capsule Take 1 g by mouth 2 (two) times daily. 12/23/11  Yes Burtis Junes, NP  furosemide (LASIX) 20 MG tablet Take 1 tablet (20 mg total) by mouth every other day. 12/23/11  Yes Burtis Junes, NP  losartan-hydrochlorothiazide (HYZAAR) 100-25 MG per tablet Take 1 tablet by mouth daily.  12/23/11 12/22/12 Yes Burtis Junes, NP  metoprolol tartrate (LOPRESSOR) 25 MG tablet Take 25 mg by mouth 2 (two) times daily.   Yes Historical Provider, MD  multivitamin [facility]) per tablet Take 1 tablet by mouth daily.     Yes Historical Provider, MD  potassium chloride SA (K-DUR,KLOR-CON) 20 MEQ tablet Take 1 tablet (20 mEq total) by mouth 2 (two) times daily. 08/03/11 08/02/12 Yes Peter M Martinique, MD  pravastatin (PRAVACHOL) 20 MG tablet Take 1 tablet (20 mg total) by mouth daily. 12/23/11  Yes Burtis Junes, NP  Tadalafil (CIALIS PO) Take 5 mg by mouth as needed (for ED).    Yes Historical Provider, MD    Family History: Family Status  Relation Status Death Age  . Brother Deceased   . Mother Other     unknown  . Father Deceased 110    unknown causes  . Brother Alive     has had kidney transplant   Social History:   reports that he has never smoked. His smokeless tobacco use includes Chew. He reports that he does not drink alcohol or use illicit drugs.   History   Social History Narrative  . No narrative on file   Review of Systems: He complains of chronic headaches. He has had no eye ear nose or throat complaints. He complains of significant severe indigestion. He has some nocturia and mild hesitancy as well as frequency. He has arthritis involving his right shoulder. He has previous knee replacements. He has mild edema. Other than as noted above the remainder of the review of systems is unremarkable.  Physical Exam: BP 161/87  Pulse 73  Temp(Src) 98.4 F (36.9 C) (Oral)  Resp 22  Ht 6\' 1"  (1.854 m)  Wt 147.056 kg (324 lb 3.2 oz)  BMI 42.78 kg/m2  SpO2 99%  General appearance: alert, cooperative, appears stated age and morbidly obese Head: Normocephalic, without obvious abnormality, atraumatic Eyes: conjunctivae/corneas clear. PERRL, EOM's intact. Fundi not examined  Throat: lips, mucosa, and tongue normal; teeth and gums normal Neck: no adenopathy, no carotid bruit,  no JVD and supple, symmetrical, trachea midline Lungs: clear to auscultation bilaterally Heart: regular rate and rhythm, S1, S2 normal, no murmur, click, rub or gallop Abdomen: soft, non-tender; bowel sounds normal; no masses,  no organomegaly Rectal: deferred Extremities: 1-2+ edema, previous bilateral scars of knee replacement Pulses: 2+ and symmetric Skin: Skin color, texture, turgor normal. No rashes or lesions Neurologic: Grossly normal  Labs: CBC  Recent Labs  05/29/12 0347  WBC 5.8  RBC 5.50  HGB 12.7*  HCT 38.8*  PLT 274  MCV 70.5*  MCH 23.1*  MCHC 32.7  RDW 14.5  LYMPHSABS 1.6  MONOABS 0.4  EOSABS 0.2  BASOSABS 0.1   CMP   Recent Labs  05/29/12 0347  NA 136  K 3.2*  CL 94*  CO2 36*  GLUCOSE 126*  BUN 14  CREATININE 1.28  CALCIUM 9.5  PROT 7.3  ALBUMIN 3.7  AST 30  ALT  20  ALKPHOS 55  BILITOT 0.3  GFRNONAA 56*  GFRAA 65*   BNP (last 3 results)  Recent Labs  05/29/12 0725  PROBNP 1249.0*   Cardiac Panel (last 3 results)  Recent Labs  05/29/12 0347 05/29/12 0725 05/29/12 1004  TROPONINI <0.30 <0.30 <0.30     Radiology: Mild cardiomegaly, no acute disease  EKG: Normal  Signed:  W. Doristine Church MD [facility]   Cardiology Consultant  05/29/2012, 12:56 PM

## 2012-05-29 NOTE — H&P (Signed)
[facility] History and Physical  Ramone Blumenstock [id number] DOB: [birth year] DOA: 05/29/2012  Referring physician: ED PCP: Phineas Inches, MD  Specialists: [facility]   Chief Complaint:  Chief Complaint  Patient presents with  . Chest Pain     HPI: Nathan Grant is a 67 y.o. male with hx of aflutter, HTN, obesity, [name] who presented to the ED on the morning of 05/29/12 with c/o chest pressure/pain every time he tries to walk, climb stairs or do light house work. Pain is 5/10 and subsides with him resting. No fevers, chills, cough,  Cardiology was called by Ed and they requested [facility] admit patient. Currently patient is on heparin and nitro drip chest pain free. Denies any recent travel.   Review of Systems: c/o severe indigestion/heartburn, The patient denies anorexia, fever, weight loss,, vision loss, decreased hearing, hoarseness,syncope,  peripheral edema, balance deficits, hemoptysis, abdominal pain, melena, hematochezia,  hematuria, incontinence, genital sores, muscle weakness, suspicious skin lesions, transient blindness, difficulty walking, depression, unusual weight change, abnormal bleeding, enlarged lymph nodes, angioedema, and breast masses.    Past Medical History  Diagnosis Date  . Atrial flutter     s/p cardioversion 06/2010  . HTN (hypertension)   . Morbid obesity   . Osteoarthritis   . Degenerative joint disease      End-stage degenerative joint disease, left knee  . Anemia   . Tobacco chew use      History of chewing tobacco usage.   . Volume depletion      Lasix and ACE inhibitor  . Hypokalemia   . Urinary tract infection      Pseudomonas urinary tract infection  . Obesity   . Microcytic anemia  03/22/2001  . Dizziness   . Chronic headaches   . Tendonitis   . Erectile dysfunction    Past Surgical History  Procedure Laterality Date  . Total knee arthroplasty  May 2009    Bilateral  . Cardioversion  07/01/2010    Social History:  reports that he has never smoked. His smokeless tobacco use includes Chew. He reports that he does not drink alcohol or use illicit drugs. Lives with wife   No Known Allergies  Family History  Problem Relation Age of Onset  . Alcohol abuse Brother   . Hypertension Brother   . Hypertension Mother      Prior to Admission medications   Medication Sig Start Date End Date Taking? Authorizing Provider  amLODipine (NORVASC) 10 MG tablet Take 1 tablet (10 mg total) by mouth daily. 12/23/11  Yes Burtis Junes, NP  aspirin 81 MG tablet Take 1 tablet (81 mg total) by mouth daily. 12/23/11  Yes Burtis Junes, NP  cloNIDine (CATAPRES) 0.2 MG tablet Take 1 tablet (0.2 mg total) by mouth every other day. 01/26/12 01/25/13 Yes Peter M Martinique, MD  fish oil-omega-3 fatty acids 1000 MG capsule Take 1 g by mouth 2 (two) times daily. 12/23/11  Yes Burtis Junes, NP  furosemide (LASIX) 20 MG tablet Take 1 tablet (20 mg total) by mouth every other day. 12/23/11  Yes Burtis Junes, NP  losartan-hydrochlorothiazide (HYZAAR) 100-25 MG per tablet Take 1 tablet by mouth daily. 12/23/11 12/22/12 Yes Burtis Junes, NP  metoprolol tartrate (LOPRESSOR) 25 MG tablet Take 25 mg by mouth 2 (two) times daily.   Yes Historical Provider, MD  multivitamin [facility]) per tablet Take 1 tablet by mouth daily.     Yes Historical Provider, MD  potassium  chloride SA (K-DUR,KLOR-CON) 20 MEQ tablet Take 1 tablet (20 mEq total) by mouth 2 (two) times daily. 08/03/11 08/02/12 Yes Peter M Martinique, MD  pravastatin (PRAVACHOL) 20 MG tablet Take 1 tablet (20 mg total) by mouth daily. 12/23/11  Yes Burtis Junes, NP  Tadalafil (CIALIS PO) Take 5 mg by mouth as needed (for ED).    Yes Historical Provider, MD   Physical Exam: Filed Vitals:   05/29/12 0457 05/29/12 0632 05/29/12 0654 05/29/12 0830  BP: 156/94 162/76 139/83 145/83  Pulse: 73 79 71 84  Temp: 98.7 F (37.1 C)  98.2 F (36.8 C)   TempSrc: Oral  Oral    Resp: 16 20 18 29   SpO2: 94% 97% 97% 96%     General:  axox3  Eyes: perrla, eomi   ENT: clear , no exudate   Neck: no JVD  Cardiovascular: rrr, no m,r,g , pulses present, homans negative   Respiratory: ctab, no w,r,c   Abdomen: soft, Nt, obese, Bs present   Skin: no rashes   Musculoskeletal: intact  Psychiatric: euthymic  Neurologic: cn 2-12 intact, strength 5/5 all 4  Labs on Admission:  Basic Metabolic Panel:  Recent Labs Lab 05/29/12 0347  NA 136  K 3.2*  CL 94*  CO2 36*  GLUCOSE 126*  BUN 14  CREATININE 1.28  CALCIUM 9.5   Liver Function Tests:  Recent Labs Lab 05/29/12 0347  AST 30  ALT 20  ALKPHOS 55  BILITOT 0.3  PROT 7.3  ALBUMIN 3.7   No results found for this basename: LIPASE, AMYLASE,  in the last 168 hours No results found for this basename: AMMONIA,  in the last 168 hours CBC:  Recent Labs Lab 05/29/12 0347  WBC 5.8  NEUTROABS 3.6  HGB 12.7*  HCT 38.8*  MCV 70.5*  PLT 274   Cardiac Enzymes:  Recent Labs Lab 05/29/12 0347 05/29/12 0725  TROPONINI <0.30 <0.30    BNP (last 3 results)  Recent Labs  05/29/12 0725  PROBNP 1249.0*   CBG: No results found for this basename: GLUCAP,  in the last 168 hours  Radiological Exams on Admission: Dg Chest 2 View  05/29/2012  *RADIOLOGY REPORT*  Clinical Data: Chest pain and shortness of breath.  CHEST - 2 VIEW  Comparison: Chest radiograph performed 05/30/2010  Findings: The lungs are well-aerated and clear.  There is no evidence of focal opacification, pleural effusion or pneumothorax.  The heart is mildly enlarged.  No acute osseous abnormalities are seen.  IMPRESSION: No acute cardiopulmonary process seen; mild cardiomegaly.   Original Report Authenticated By: Santa Lighter, M.D.     EKG: Independently reviewed. Nsr, without ST changes   Assessment/Plan Principal Problem:   Unstable angina Active Problems:   Atrial flutter   HTN (hypertension)   Obesity    Osteoarthritis   Hypokalemia   Erectile dysfunction   Chronic headaches   Microcytic anemia   1. Unstable angina - plan for heparin drip, nitro drip, cardio consult. DDx PE (less likely with normal d dimer, ), CHF ( doubt with near normal BNP), anemia is chronic in nature and Hb is at baseline, - had normal stress test in 2002. 2. A flutter s/p cardioversion - maintaining sinus rhythm - resume BB  3. HTN - resume home meds and monitor closely 4. Hypokalemia - replete and follow    Code Status: full  Family Communication: wife  Disposition Plan: home    Kassidy Frankson [facility] Pager [phone]  If 7PM-7AM,  please contact night-coverage [email or web address] Password [facility] 05/29/2012, 8:53 AM

## 2012-05-29 NOTE — ED Notes (Signed)
Nurse attempted IV access, Nurse made initial skin puncture and stopped to feel for vein, pt said "stop, you ain't digging in my arm. Get someone who knows what they are doing." Nurse withdrew needle and bandaged pt's arm.

## 2012-05-29 NOTE — Progress Notes (Signed)
ANTICOAGULATION CONSULT NOTE - Initial Consult  Pharmacy Consult for heparin Indication: chest pain/ACS  No Known Allergies  Patient Measurements: Heparin Dosing Weight: 120kg  Vital Signs: Temp: 98.7 F (37.1 C) (02/16 0457) Temp src: Oral (02/16 0457) BP: 156/94 mmHg (02/16 0457) Pulse Rate: 73 (02/16 0457)  Labs:  Recent Labs  05/29/12 0347  HGB 12.7*  HCT 38.8*  PLT 274  CREATININE 1.28  TROPONINI <0.30    Medical History: Past Medical History  Diagnosis Date  . Atrial flutter     s/p cardioversion 06/2010  . Chronic anticoagulation   . HTN (hypertension)   . Morbid obesity   . Osteoarthritis   . Degenerative joint disease      End-stage degenerative joint disease, left knee  . Anemia   . Acute renal insufficiency   . Tobacco chew use      History of chewing tobacco usage.   . Acute renal failure      Acute renal failure due to postoperative anemia  . Volume depletion      Lasix and ACE inhibitor  . Hypokalemia   . Urinary tract infection      Pseudomonas urinary tract infection  . Obesity   . Microcytic anemia  03/22/2001  . Dizziness   . Chronic headaches   . Tendonitis   . Erectile dysfunction     Assessment: 67yo male c/o CP x2-3d, initial troponin negative, to begin heparin.  Goal of Therapy:  Heparin level 0.3-0.7 units/ml Monitor platelets by anticoagulation protocol: Yes   Plan:  Will give heparin 4000 units IV bolus x1 followed by gtt at 1500 units/hr and monitor heparin levels and CBC.  Rogue Bussing PharmD BCPS 05/29/2012,5:45 AM

## 2012-05-29 NOTE — ED Notes (Signed)
Primary RN asked a second nurse to attempt IV access. Ed, RN at bedside.

## 2012-05-29 NOTE — ED Notes (Signed)
Just established IV access.

## 2012-05-29 NOTE — Progress Notes (Signed)
ANTICOAGULATION CONSULT NOTE - Follow Up Consult  Pharmacy Consult for heparin Indication: chest pain/ACS  No Known Allergies  Patient Measurements: Height: 6\' 1"  (185.4 cm) Weight: 324 lb 3.2 oz (147.056 kg) IBW/kg (Calculated) : 79.9 Heparin Dosing Weight: 120kg  Vital Signs: Temp: 98.6 F (37 C) (02/16 1359) Temp src: Oral (02/16 1359) BP: 157/92 mmHg (02/16 1359) Pulse Rate: 64 (02/16 1359)  Labs:  Recent Labs  05/29/12 0347 05/29/12 0725 05/29/12 1004 05/29/12 1355  HGB 12.7*  --   --   --   HCT 38.8*  --   --   --   PLT 274  --   --   --   HEPARINUNFRC  --   --   --  0.41  CREATININE 1.28  --   --   --   TROPONINI <0.30 <0.30 <0.30  --     Estimated Creatinine Clearance: 84.6 ml/min (by C-G formula based on Cr of 1.28).  Assessment: Patient's a 67 y.o M on heparin for r/o ACS.  Heparin level now back therapeutic with current rate of 1500 units/hr.  No bleeding noted.  Goal of Therapy:  Heparin level 0.3-0.7 units/ml Monitor platelets by anticoagulation protocol: Yes   Plan:  1) continue current heparin regimen for now  Melonee Gerstel P 05/29/2012,2:45 PM

## 2012-05-29 NOTE — Progress Notes (Signed)
Utilization review completed.  

## 2012-05-29 NOTE — ED Notes (Signed)
Pt transported to Xray. 

## 2012-05-30 ENCOUNTER — Inpatient Hospital Stay (HOSPITAL_COMMUNITY): Payer: Medicare Other

## 2012-05-30 DIAGNOSIS — R079 Chest pain, unspecified: Secondary | ICD-10-CM

## 2012-05-30 DIAGNOSIS — N189 Chronic kidney disease, unspecified: Secondary | ICD-10-CM

## 2012-05-30 DIAGNOSIS — N179 Acute kidney failure, unspecified: Secondary | ICD-10-CM

## 2012-05-30 DIAGNOSIS — E785 Hyperlipidemia, unspecified: Secondary | ICD-10-CM

## 2012-05-30 DIAGNOSIS — I119 Hypertensive heart disease without heart failure: Secondary | ICD-10-CM

## 2012-05-30 LAB — BASIC METABOLIC PANEL
BUN: 21 mg/dL (ref 6–23)
Creatinine, Ser: 1.49 mg/dL — ABNORMAL HIGH (ref 0.50–1.35)
GFR calc Af Amer: 54 mL/min — ABNORMAL LOW (ref >90)
GFR calc non Af Amer: 47 mL/min — ABNORMAL LOW (ref >90)
Glucose, Bld: 112 mg/dL — ABNORMAL HIGH (ref 70–99)
Potassium: 3.5 mEq/L (ref 3.5–5.1)

## 2012-05-30 LAB — LIPID PANEL
HDL: 54 mg/dL (ref >39)
LDL Cholesterol: 74 mg/dL (ref 0–99)
Total CHOL/HDL Ratio: 2.6 RATIO
Triglycerides: 70 mg/dL (ref <150)

## 2012-05-30 LAB — CBC
HCT: 35.5 % — ABNORMAL LOW (ref 39.0–52.0)
Hemoglobin: 11.8 g/dL — ABNORMAL LOW (ref 13.0–17.0)
MCHC: 33.2 g/dL (ref 30.0–36.0)
MCV: 70.4 fL — ABNORMAL LOW (ref 78.0–100.0)
RDW: 14.9 % (ref 11.5–15.5)

## 2012-05-30 LAB — GLUCOSE, CAPILLARY: Glucose-Capillary: 112 mg/dL — ABNORMAL HIGH (ref 70–99)

## 2012-05-30 MED ORDER — METOPROLOL TARTRATE 12.5 MG HALF TABLET
12.5000 mg | ORAL_TABLET | Freq: Two times a day (BID) | ORAL | Status: DC
  Administered 2012-05-30 – 2012-05-31 (×2): 12.5 mg via ORAL
  Filled 2012-05-30 (×4): qty 1

## 2012-05-30 MED ORDER — REGADENOSON 0.4 MG/5ML IV SOLN
0.4000 mg | Freq: Once | INTRAVENOUS | Status: AC
  Administered 2012-05-30: 0.4 mg via INTRAVENOUS
  Filled 2012-05-30: qty 5

## 2012-05-30 MED ORDER — HEPARIN SODIUM (PORCINE) 5000 UNIT/ML IJ SOLN
5000.0000 [IU] | Freq: Three times a day (TID) | INTRAMUSCULAR | Status: DC
  Filled 2012-05-30 (×4): qty 1

## 2012-05-30 MED ORDER — HEPARIN SODIUM (PORCINE) 5000 UNIT/ML IJ SOLN
5000.0000 [IU] | Freq: Three times a day (TID) | INTRAMUSCULAR | Status: DC
  Administered 2012-05-30 – 2012-05-31 (×3): 5000 [IU] via SUBCUTANEOUS
  Filled 2012-05-30 (×6): qty 1

## 2012-05-30 NOTE — Progress Notes (Signed)
Myoview performed - Jauca to read after rest images 2/18.

## 2012-05-30 NOTE — Progress Notes (Signed)
Nutrition Brief Note  Patient identified on the Malnutrition Screening Tool (MST) Report for recent weight loss without trying.  Patient reports this weight loss was in fact intentional given modification in his eating habits.  Wt Readings from Last 10 Encounters:  05/30/12 330 lb 11.2 oz (150.005 kg)  01/05/12 336 lb (152.409 kg)  08/03/11 318 lb (144.244 kg)  07/03/11 324 lb 1.9 oz (147.02 kg)  03/02/11 336 lb (152.409 kg)  11/10/10 336 lb 6.4 oz (152.59 kg)  08/11/10 335 lb (151.955 kg)  07/21/10 336 lb 3.2 oz (152.499 kg)    Body mass index is 43.64 kg/(m^2). Patient meets criteria for Obesity Class III based on current BMI.   Current diet order is Heart Healthy, patient is consuming approximately 100% of meals at this time. Labs and medications reviewed.   No nutrition interventions warranted at this time. Please consult RD as needed.  Arthur Holms, RD, LDN Pager #: [phone] After-Hours Pager #: [phone]

## 2012-05-30 NOTE — Progress Notes (Signed)
TELEMETRY: Reviewed telemetry pt in NSR, epidsodes of bradycardia with junctional rhythm while asleep with junctional rhythm.: Filed Vitals:   05/29/12 0935 05/29/12 1359 05/29/12 2038 05/30/12 0402  BP: 161/87 157/92 151/85 161/53  Pulse: 73 64 73 52  Temp: 98.4 F (36.9 C) 98.6 F (37 C) 98.3 F (36.8 C) 98 F (36.7 C)  TempSrc: Oral Oral Oral Oral  Resp: 22 22 20 20   Height: 6\' 1"  (1.854 m)     Weight: 324 lb 3.2 oz (147.056 kg)   330 lb 11.2 oz (150.005 kg)  SpO2: 99% 98% 97% 99%    Intake/Output Summary (Last 24 hours) at 05/30/12 0751 Last data filed at 05/30/12 0300  Gross per 24 hour  Intake 127.83 ml  Output    650 ml  Net -522.17 ml    SUBJECTIVE Denies further chest pain. No SOB. Didn't sleep well.  LABS: Basic Metabolic Panel:  Recent Labs  05/29/12 0347 05/30/12 0545  NA 136 140  K 3.2* 3.5  CL 94* 98  CO2 36* 33*  GLUCOSE 126* 112*  BUN 14 21  CREATININE 1.28 1.49*  CALCIUM 9.5 8.9   Liver Function Tests:  Recent Labs  05/29/12 0347  AST 30  ALT 20  ALKPHOS 55  BILITOT 0.3  PROT 7.3  ALBUMIN 3.7   CBC:  Recent Labs  05/29/12 0347 05/30/12 0545  WBC 5.8 7.5  NEUTROABS 3.6  --   HGB 12.7* 11.8*  HCT 38.8* 35.5*  MCV 70.5* 70.4*  PLT 274 273   Cardiac Enzymes:  Recent Labs  05/29/12 0725 05/29/12 1004 05/29/12 1355  TROPONINI <0.30 <0.30 <0.30   BNP: 1249  D-Dimer:  Recent Labs  05/29/12 0725  DDIMER 0.39   Radiology/Studies:  Dg Chest 2 View  05/29/2012  *RADIOLOGY REPORT*  Clinical Data: Chest pain and shortness of breath.  CHEST - 2 VIEW  Comparison: Chest radiograph performed 05/30/2010  Findings: The lungs are well-aerated and clear.  There is no evidence of focal opacification, pleural effusion or pneumothorax.  The heart is mildly enlarged.  No acute osseous abnormalities are seen.  IMPRESSION: No acute cardiopulmonary process seen; mild cardiomegaly.   Original Report Authenticated By: Santa Lighter, M.D.     Ecg: [phone] 02:57:43 [facility]/ED ROUTINE RECORD Normal sinus rhythm Normal ECG  Transthoracic Echocardiography  Patient: Marshawn, Cline MR #: [id number] Study Date: 05/29/2012 Gender: M Age: 67 Height: 185.4cm Weight: 147kg BSA: 2.16m^2 Pt. Status: Room: 2032  [place], MD Ivalee, MD [place], [place], Sorin Jearl Klinefelter, Gwyndolyn Saxon cc:  ------------------------------------------------------------ LV EF: 65% - 70%  ------------------------------------------------------------ History: PMH: Chest pain.  ------------------------------------------------------------ Study Conclusions  - Left ventricle: Wall thickness was increased in a pattern of severe LVH. There is a suggestive of assymetric apical hypertrphy seen in some views. Systolic function was vigorous. The estimated ejection fraction was in the range of 65% to 70%. Doppler parameters are consistent with abnormal left ventricular relaxation (grade 1 diastolic dysfunction). - Left atrium: The atrium was moderately dilated. Transthoracic echocardiography. M-mode, complete 2D, spectral Doppler, and color Doppler. Height: Height: 185.4cm. Height: 73in. Weight: Weight: 147kg. Weight: 323.3lb. Body mass index: BMI: 42.7kg/m^2. Body surface area: BSA: 2.7m^2. Patient status: Inpatient.  ------------------------------------------------------------  ------------------------------------------------------------ Left ventricle: Wall thickness was increased in a pattern of severe LVH. There is a suggestive of assymetric apical hypertrophy seen in some views. Systolic function was vigorous. The estimated ejection fraction was  in the range of 65% to 70%. Doppler parameters are consistent with abnormal left ventricular relaxation (grade 1  diastolic dysfunction).  ------------------------------------------------------------ Aortic valve: Mildly calcified leaflets. Cusp separation was normal. Doppler: Transvalvular velocity was within the normal range. There was no stenosis. No regurgitation.  ------------------------------------------------------------ Mitral valve: Structurally normal valve. Leaflet separation was normal. Doppler: Transvalvular velocity was within the normal range. There was no evidence for stenosis. No regurgitation.  ------------------------------------------------------------ Left atrium: The atrium was moderately dilated.  ------------------------------------------------------------ Right ventricle: The cavity size was normal. Wall thickness was normal. Systolic function was normal.  ------------------------------------------------------------ Pulmonic valve: Poorly visualized. The valve appears to be grossly normal.  ------------------------------------------------------------ Tricuspid valve: Structurally normal valve. Leaflet separation was normal. Doppler: Transvalvular velocity was within the normal range. No regurgitation.  ------------------------------------------------------------ Right atrium: The atrium was normal in size.  ------------------------------------------------------------ Pericardium: There was no pericardial effusion.  ------------------------------------------------------------ Systemic veins: Inferior vena cava: The vessel was normal in size; the respirophasic diameter changes were in the normal range (= 50%).  ------------------------------------------------------------  2D measurements Normal Doppler measurements Normal Left ventricle Mitral valve LVID ED, 48.6 mm 43-52 Peak E vel 54. cm/s ------ chord, 8 PLAX Peak A vel 80. cm/s ------ LVID ES, 26.7 mm 23-38 9 chord, Deceleration 322 ms 150-23 PLAX time 0 FS, chord, 45 % >29 Peak E/A 0.7 ------ PLAX  ratio LVPW, ED 17.6 mm ------ IVS/LVPW 0.99 <1.3 ratio, ED Ventricular septum IVS, ED 17.5 mm ------ Aorta Root diam, 32 mm ------ ED Left atrium AP dim 51 mm ------ AP dim 1.93 cm/m^2 <2.2 index Right ventricle RVID ED, 33.3 mm 19-38 PLAX  ------------------------------------------------------------ Prepared and Electronically Authenticated by  Landry Corporal [id number]   PHYSICAL EXAM General: Well developed, obese, in no acute distress. Head: Normocephalic, atraumatic, sclera non-icteric, no xanthomas, nares are without discharge. Neck: Negative for carotid bruits. JVD not elevated. Lungs: Clear bilaterally to auscultation without wheezes, rales, or rhonchi. Breathing is unlabored. Heart: RRR S1 S2 without murmurs, rubs, or gallops.  Abdomen: Soft, non-tender, non-distended with normoactive bowel sounds. No hepatomegaly. Obese. No obvious abdominal masses. Msk:  Strength and tone appears normal for age. Extremities: 1+ edema.  Distal pedal pulses are 2+ and equal bilaterally. Neuro: Alert and oriented X 3. Moves all extremities spontaneously. Psych:  Responds to questions appropriately with a normal affect.  ASSESSMENT AND PLAN: 1. Chest pain. Patient has ruled out for MI. I suspect this is related to severe HTN with increased myocardial strain. Last ischemic evaluation in 2002 with Cardiolite negative. I am not anxious to cath given his acute on chronic renal failure. Will proceed with 2 day Lexiscan myoview today. Since he has ruled out will stop IV heparin now. LV systolic function normal by Echo. Severe LVH. 2. Acute on chronic renal failure due to uncontrolled HTN. 3. HTN uncontrolled. Compliance is a major factor. 4. Bradycardia with junctional rhythm. Will reduce metoprolol to 12.5 mg bid. I suspect patient has OSA which may contribute to bradycardia. Will need sleep study as outpatient. 5. Morbid obesity. ?OSA. 6. Hyperlipidemia. 7. History of  atrial flutter. S/p cardioversion 2012.  Principal Problem:   Unstable angina Active Problems:   Hypertensive heart disease   Atrial flutter   Morbid obesity   Hypokalemia   Erectile dysfunction   Chronic headaches   Microcytic anemia   Hyperlipidemia    Weston Brass Peter Martinique MD,FACC 05/30/2012 8:01 AM

## 2012-05-30 NOTE — Progress Notes (Signed)
[facility] PROGRESS NOTE  Nathan Grant [id number] DOB: 29-Sep-1945 DOA: 05/29/2012 PCP: Phineas Inches, MD  Assessment/Plan: Principal Problem:   Unstable angina Active Problems:   Atrial flutter   Hypertensive heart disease   Morbid obesity   Hypokalemia   Erectile dysfunction   Chronic headaches   Microcytic anemia   Hyperlipidemia   Acute on chronic renal failure    1. Chest pain/Unstable angina: Patient presented with chest pain, against a background of significant cardiovascular risk factors. He was placed on iv Heparin infusion as well as NTG drip. Cardiac enzymes remained un-elevated, 12-Lead EKG was devoid of acute ischemic changes. He has remained asymptomatic overnight, and no arrhythmias were recorded on telemetry. Dr Tollie Eth provided cardiology consultation, and patient was seen by Dr Peter Martinique today. Per cardiology, Heparin/NTG has been discontinued, but 2-day stress myoview is planned. 2. A flutter: Patient has a history of atrial flutter, and is s/p cardioversion in 2012. Maintaining sinus rhythm  On beta-blocker.  3. HTN: Patient is on multiple ant-hypertensive, including Amlodipine, Losartan-HCTZ, Clonidine and Lopressor. Will continue, and adjust as indicated.   4. Hypokalemia: Repleting as indicated.  5. Dyslipidemia: on statin. Checking lipid panel.    Code Status: Full Code.  Family Communication:  Disposition Plan: Per cardiology.    Brief narrative: 67 y.o. male with hx of A. flutter, HTN, obesity, [name], who presented to the ED on the morning of 05/29/12, with c/o chest pressure/pain every time he tries to walk, climb stairs or do light house work. Pain is 5/10 and subsides with him resting. No fevers, chills, cough. Cardiology was called by ED and they requested [facility] admit patient. Was placed on Heparin and ivi NTG. Denies any recent travel. Admitted for further management.    Consultants:  Dr Wynonia Lawman, cardiologist.    Procedures:  CXR  Antibiotics:  N/A.   HPI/Subjective: Asymptomatic.   Objective: Vital signs in last 24 hours: Temp:  [98 F (36.7 C)-98.6 F (37 C)] 98 F (36.7 C) (02/17 0402) Pulse Rate:  [52-73] 52 (02/17 0402) Resp:  [20-22] 20 (02/17 0402) BP: (151-161)/(53-92) 161/53 mmHg (02/17 0402) SpO2:  [97 %-99 %] 99 % (02/17 0402) Weight:  [147.056 kg (324 lb 3.2 oz)-150.005 kg (330 lb 11.2 oz)] 150.005 kg (330 lb 11.2 oz) (02/17 0402) Weight change:  Last BM Date:  (prior to admission)  Intake/Output from previous day: 02/16 0701 - 02/17 0700 In: 127.8 [I.V.:127.8] Out: 650 [Urine:650]     Physical Exam: General: Comfortable, alert, communicative, fully oriented, not short of breath at rest.  HEENT:  No clinical pallor, no jaundice, no conjunctival injection or discharge. Hydration is fair.  NECK:  Supple, JVP not seen, no carotid bruits, no palpable lymphadenopathy, no palpable goiter. CHEST:  Clinically clear to auscultation, no wheezes, no crackles. HEART:  Sounds 1 and 2 heard, normal, regular, no murmurs. ABDOMEN:  Obese, soft, non-tender, no palpable organomegaly, no palpable masses, normal bowel sounds. GENITALIA:  Not examined. LOWER EXTREMITIES:  Mild-moderate pitting edema, palpable peripheral pulses. MUSCULOSKELETAL SYSTEM:  Generalized osteoarthritic changes, otherwise, normal. CENTRAL NERVOUS SYSTEM:  No focal neurologic deficit on gross examination.  Lab Results:  Recent Labs  05/29/12 0347 05/30/12 0545  WBC 5.8 7.5  HGB 12.7* 11.8*  HCT 38.8* 35.5*  PLT 274 273    Recent Labs  05/29/12 0347 05/30/12 0545  NA 136 140  K 3.2* 3.5  CL 94* 98  CO2 36* 33*  GLUCOSE 126* 112*  BUN  14 21  CREATININE 1.28 1.49*  CALCIUM 9.5 8.9   No results found for this or any previous visit (from the past 240 hour(s)).   Studies/Results: Dg Chest 2 View  05/29/2012  *RADIOLOGY REPORT*  Clinical Data: Chest pain and shortness of breath.  CHEST - 2  VIEW  Comparison: Chest radiograph performed 05/30/2010  Findings: The lungs are well-aerated and clear.  There is no evidence of focal opacification, pleural effusion or pneumothorax.  The heart is mildly enlarged.  No acute osseous abnormalities are seen.  IMPRESSION: No acute cardiopulmonary process seen; mild cardiomegaly.   Original Report Authenticated By: Santa Lighter, M.D.     Medications: Scheduled Meds: . amLODipine  10 mg Oral Daily  . aspirin  81 mg Oral Daily  . atorvastatin  20 mg Oral q1800  . cloNIDine  0.1 mg Oral BID  . heparin subcutaneous  5,000 Units Subcutaneous Q8H  . losartan  100 mg Oral Daily   And  . hydrochlorothiazide  25 mg Oral Daily  . metoprolol tartrate  12.5 mg Oral BID  . omega-3 acid ethyl esters  1 g Oral BID  . potassium chloride SA  20 mEq Oral BID  . regadenoson  0.4 mg Intravenous Once  . sodium chloride  3 mL Intravenous Q12H   Continuous Infusions:  PRN Meds:.acetaminophen, acetaminophen, alum & mag hydroxide-simeth, ondansetron (ZOFRAN) IV, ondansetron, zolpidem    LOS: 1 day   Trong Gosling,CHRISTOPHER  [facility] Pager [phone]. If 8PM-8AM, please contact night-coverage at [email or web address], password [place] 05/30/2012, 8:58 AM  LOS: 1 day

## 2012-05-30 NOTE — Progress Notes (Signed)
Patient had unsustained frequent bradycardia to high 30's during night time. Patient was not symptomatic during the hole time. Will continue monitoring.

## 2012-05-31 ENCOUNTER — Encounter (HOSPITAL_COMMUNITY)
Admit: 2012-05-31 | Discharge: 2012-05-31 | Disposition: A | Discharge status: Home / Self Care | Payer: Medicare Other | Attending: Cardiology | Admitting: Cardiology

## 2012-05-31 ENCOUNTER — Inpatient Hospital Stay (HOSPITAL_COMMUNITY)
Admit: 2012-05-31 | Discharge: 2012-05-31 | Disposition: A | Discharge status: Home / Self Care | Payer: Medicare Other | Attending: Cardiology | Admitting: Cardiology

## 2012-05-31 MED ORDER — TECHNETIUM TC 99M SESTAMIBI GENERIC - CARDIOLITE
30.0000 | Freq: Once | INTRAVENOUS | Status: AC | PRN
  Administered 2012-05-31: 30 via INTRAVENOUS

## 2012-05-31 MED ORDER — TECHNETIUM TC 99M SESTAMIBI - CARDIOLITE
30.0000 | Freq: Once | INTRAVENOUS | Status: AC | PRN
  Administered 2012-05-30: 30 via INTRAVENOUS

## 2012-05-31 NOTE — Progress Notes (Signed)
Pt refused to have labs drawn this am. He states "I'm tired of being stuck, my arms are sore". Attempted to explain the need for lab work to be done, still unable to convince pt to have labs drawn.

## 2012-05-31 NOTE — Progress Notes (Addendum)
Patient Name: Nathan Grant Date of Encounter: 05/31/2012  Principal Problem:   Unstable angina Active Problems:   Atrial flutter   Hypertensive heart disease   Morbid obesity   Hypokalemia   Erectile dysfunction   Chronic headaches   Microcytic anemia   Hyperlipidemia   Acute on chronic renal failure    SUBJECTIVE: No more chest pain, DOE at baseline. Chronic LE edema, he thinks it is at baseline.   OBJECTIVE Filed Vitals:   05/30/12 1349 05/30/12 2043 05/31/12 0400 05/31/12 0525  BP: 139/82 159/72  141/81  Pulse: 50 56  61  Temp: 98.4 F (36.9 C) 98.5 F (36.9 C)  97.6 F (36.4 C)  TempSrc: Oral Oral  Oral  Resp: 19 19  19   Height:      Weight:   330 lb 7.5 oz (149.9 kg)   SpO2: 100% 95%  96%    Intake/Output Summary (Last 24 hours) at 05/31/12 1334 Last data filed at 05/31/12 0900  Gross per 24 hour  Intake    480 ml  Output    650 ml  Net   -170 ml   Filed Weights   05/29/12 0935 05/30/12 0402 05/31/12 0400  Weight: 324 lb 3.2 oz (147.056 kg) 330 lb 11.2 oz (150.005 kg) 330 lb 7.5 oz (149.9 kg)     PHYSICAL EXAM General: Well developed, well nourished, male in no acute distress. Head: Normocephalic, atraumatic.  Neck: Supple without bruits, JVD not elevated. Lungs:  Resp regular and unlabored, CTA bilaterally. Heart: RRR, S1, S2, no S3, S4, or murmur. Abdomen: Soft, non-tender, non-distended, BS + x 4.  Extremities: No clubbing, cyanosis, 1-2+ edema.  Neuro: Alert and oriented X 3. Moves all extremities spontaneously. Psych: Normal affect.  LABS: CBC:  Recent Labs  05/29/12 0347 05/30/12 0545  WBC 5.8 7.5  NEUTROABS 3.6  --   HGB 12.7* 11.8*  HCT 38.8* 35.5*  MCV 70.5* 70.4*  PLT 274 273   INR:No results found for this basename: INR,  in the last 72 hours Basic Metabolic Panel:  Recent Labs  05/29/12 0347 05/30/12 0545  NA 136 140  K 3.2* 3.5  CL 94* 98  CO2 36* 33*  GLUCOSE 126* 112*  BUN 14 21  CREATININE 1.28 1.49*    CALCIUM 9.5 8.9   Liver Function Tests:  Recent Labs  05/29/12 0347  AST 30  ALT 20  ALKPHOS 55  BILITOT 0.3  PROT 7.3  ALBUMIN 3.7   Cardiac Enzymes:  Recent Labs  05/29/12 0725 05/29/12 1004 05/29/12 1355  TROPONINI <0.30 <0.30 <0.30   BNP: Pro B Natriuretic peptide (BNP)  Date/Time Value Range Status  05/29/2012  7:25 AM 1249.0* 0 - 125 pg/mL Final  08/11/2010  9:08 AM 218.0* 0.0 - 100.0 pg/mL Final   D-dimer:  Recent Labs  05/29/12 0725  DDIMER 0.39   Hemoglobin A1C: Pending No results found for this basename: HGBA1C,  in the last 72 hours Fasting Lipid Panel:  Recent Labs  05/30/12 1000  CHOL 142  HDL 54  LDLCALC 74  TRIG 70  CHOLHDL 2.6   Thyroid Function Tests: TSH pending No results found for this basename: TSH, T4TOTAL, FREET3, T3FREE, THYROIDAB,  in the last 72 hours  TELE: SR   Radiology/Studies: Nm Myocar Multi W/spect W/wall Motion / Ef 05/31/2012  Mr. Nathan Grant is a 67 yo who was admitted with chest pain.  A 2 day Nathan Grant was ordered for further evaluation.  The patient  received an IV injection of Myoview and the resting Myoview images were obtained following brief delay.  The following day the patient received an IV injection of Lexiscan. He had no ST-T wave changes during the injection.  A second dose of Myoview was injected after 45 seconds. Quantitated gated SPECT images were obtained following brief delay.  The raw data images reveal minimal motion artifact.  The stress Myoview images reveal fairly smooth and homogeneous uptake in all areas of the myocardium.  The resting Myoview images reveals similar pattern of smooth and homogeneous uptake of all areas of the myocardium.  The cine loop images reveal fairly normal thickening and contractility of the myocardium.  The computer algorithm calculates an end diastolic volume of [id number] ml.  The end-systolic volume is 0000000 ml.  Ejection fraction is 37%. Actually into the left ventricular  systolic function is higher than the neck - probably in the range of 55-60%.  The computer derived contours do not match  the area of uptake in the left ventricle.  Impression: No evidence of ischemia The normal left ventricular systolic function by visual inspection.  The computer generated left ventricular function is moderately depressed but I think the computer is clearly underestimating the left ventricular contractility. An echocardiogram performed several days ago confirms normal left ventricular systolic function with an EF of 65-70%.  Nathan Grant, Nathan Grant., MD, [facility]  1:19 P:M, Feb. 18, 2014   Original Report Authenticated By: Nathan Grant, M.D.       Current Medications:  . amLODipine  10 mg Oral Daily  . aspirin  81 mg Oral Daily  . atorvastatin  20 mg Oral q1800  . cloNIDine  0.1 mg Oral BID  . heparin subcutaneous  5,000 Units Subcutaneous Q8H  . losartan  100 mg Oral Daily   And  . hydrochlorothiazide  25 mg Oral Daily  . metoprolol tartrate  12.5 mg Oral BID  . omega-3 acid ethyl esters  1 g Oral BID  . potassium chloride SA  20 mEq Oral BID  . sodium chloride  3 mL Intravenous Q12H      ASSESSMENT AND PLAN: Principal Problem:   Unstable angina - ez negative for MI, MV pending, no further eval if negative, f/u with Dr Nathan Grant, appt made.  Active Problems:   Atrial flutter - maint SR    Hyperglycemia - ck HgbA1C, mgt per primary MD  Otherwise, per primary MD. Pt needs diuretic and was on QOD Lasix 20 mg PTA. BUN/Cr up slightly today, mgt per primary MD.    Hypertensive heart disease    Morbid obesity   Hypokalemia   Erectile dysfunction   Chronic headaches   Microcytic anemia   Hyperlipidemia   Acute on chronic renal failure   Signed, Nathan Grant , PA-C 1:34 PM 05/31/2012 Patient seen and examined and history reviewed. Agree with above findings and plan. Myoview study shows normal perfusion. Mild enzyme leak due to severe HTN and increased myocardial  strain. EF falsely low by myoview. Normal by Echo. BP improved. Stable for discharge today from cardiac standpoint. Recommend sleep study as outpatient .  Nathan Grant Nathan Grant 05/31/2012 1:56 PM

## 2012-05-31 NOTE — Discharge Summary (Signed)
Physician Discharge Summary  Nathan Grant [id number] DOB: 1945/11/17 DOA: 05/29/2012  PCP: Phineas Inches, MD  Admit date: 05/29/2012 Discharge date: 05/31/2012  Time spent: 40 minutes  Recommendations for Outpatient Follow-up:  1. Follow up with primary MD. 2. Follow up with Dr Peter Martinique, primary cardiologist.   Discharge Diagnoses:  Principal Problem:   Unstable angina Active Problems:   Atrial flutter   Hypertensive heart disease   Morbid obesity   Hypokalemia   Erectile dysfunction   Chronic headaches   Microcytic anemia   Hyperlipidemia   Acute on chronic renal failure   Discharge Condition: Satisfactory.  Diet recommendation: Heart-Healthy.  Filed Weights   05/29/12 0935 05/30/12 0402 05/31/12 0400  Weight: 147.056 kg (324 lb 3.2 oz) 150.005 kg (330 lb 11.2 oz) 149.9 kg (330 lb 7.5 oz)    History of present illness:  67 y.o. male with hx of A. flutter, HTN, obesity, [name], who presented to the ED on the morning of 05/29/12, with c/o chest pressure/pain every time he tries to walk, climb stairs or do light house work. Pain is 5/10 and subsides with him resting. No fevers, chills, cough. Cardiology was called by ED and they requested [facility] admit patient. Was placed on Heparin and ivi NTG. Denies any recent travel. Admitted for further management.   Hospital Course:  1. Chest pain: Patient presented with chest pain, against a background of significant cardiovascular risk factors. He was placed on iv Heparin infusion as well as NTG drip. Cardiac enzymes remained un-elevated, 12-Lead EKG was devoid of acute ischemic changes. He has remained asymptomatic overnight, and no arrhythmias were recorded on telemetry. Dr Tollie Eth provided cardiology consultation, and patient was seen by Dr Peter Martinique on 05/30/12. Heparin/NTG were discontinued on cardiology recommendations as he had ruled out for acute MI, and 2-day stress Myoview carried out. This was  concluded on 05/31/12, and showed no evidence of ischemia. An echocardiogram performed several days ago confirms normal left ventricular systolic function with an EF of 65-70%.  2. A flutter: Patient has a history of atrial flutter, and is s/p cardioversion in 2012. He remained in SR, during the course of his hospitalization, on beta-blocker.  3. HTN: Patient is on multiple anti-hypertensive, including Amlodipine, Losartan-HCTZ, Clonidine and Lopressor. These were continued, and BP was reasonably, albeit sub-optimally controlled.  4. Hypokalemia: Repleted as indicated.  5. Dyslipidemia: On statin. Lipid panel showed TC 142, TG 70, HDL 54, LDL 74. Excellent profile.    Procedures:  See below.   2-Day Stress Myoview.   Consultations: Dr Tollie Eth, cardiologist.    Discharge Exam: Filed Vitals:   05/30/12 2043 05/31/12 0400 05/31/12 0525 05/31/12 1338  BP: 159/72  141/81 138/80  Pulse: 56  61 65  Temp: 98.5 F (36.9 C)  97.6 F (36.4 C) 98 F (36.7 C)  TempSrc: Oral  Oral Oral  Resp: 19  19 18   Height:      Weight:  149.9 kg (330 lb 7.5 oz)    SpO2: 95%  96% 96%    General: Comfortable, alert, communicative, fully oriented, not short of breath at rest.  HEENT: No clinical pallor, no jaundice, no conjunctival injection or discharge. Hydration is fair.  NECK: Supple, JVP not seen, no carotid bruits, no palpable lymphadenopathy, no palpable goiter.  CHEST: Clinically clear to auscultation, no wheezes, no crackles.  HEART: Sounds 1 and 2 heard, normal, regular, no murmurs.  ABDOMEN: Obese, soft, non-tender, no palpable organomegaly, no palpable  masses, normal bowel sounds.  GENITALIA: Not examined.  LOWER EXTREMITIES: Mild-moderate pitting edema, palpable peripheral pulses.  MUSCULOSKELETAL SYSTEM: Generalized osteoarthritic changes, otherwise, normal.  CENTRAL NERVOUS SYSTEM: No focal neurologic deficit on gross examination.  Discharge Instructions      Discharge Orders    Future Appointments Provider Department Dept Phone   07/15/2012 10:45 AM Peter M Martinique, MD [facility]) [phone]   Future Orders Complete By Expires     Diet - low sodium heart healthy  As directed     Increase activity slowly  As directed         Medication List    TAKE these medications       amLODipine 10 MG tablet  Commonly known as:  NORVASC  Take 1 tablet (10 mg total) by mouth daily.     aspirin 81 MG tablet  Take 1 tablet (81 mg total) by mouth daily.     CIALIS PO  Take 5 mg by mouth as needed (for ED).     cloNIDine 0.2 MG tablet  Commonly known as:  CATAPRES  Take 1 tablet (0.2 mg total) by mouth every other day.     fish oil-omega-3 fatty acids 1000 MG capsule  Take 1 g by mouth 2 (two) times daily.     furosemide 20 MG tablet  Commonly known as:  LASIX  Take 1 tablet (20 mg total) by mouth every other day.     losartan-hydrochlorothiazide 100-25 MG per tablet  Commonly known as:  HYZAAR  Take 1 tablet by mouth daily.     metoprolol tartrate 25 MG tablet  Commonly known as:  LOPRESSOR  Take 25 mg by mouth 2 (two) times daily.     multivitamin per tablet  Take 1 tablet by mouth daily.     potassium chloride SA 20 MEQ tablet  Commonly known as:  K-DUR,KLOR-CON  Take 1 tablet (20 mEq total) by mouth 2 (two) times daily.     pravastatin 20 MG tablet  Commonly known as:  PRAVACHOL  Take 1 tablet (20 mg total) by mouth daily.       Follow-up Information   Follow up with Peter Martinique, MD On 07/15/2012. (at 10:45 am)    Contact information:   [address] [place] [address] [phone]       Schedule an appointment as soon as possible for a visit with Phineas Inches, MD.   Contact information:   [address] [place] [address] [phone]        The results of significant diagnostics from this hospitalization (including imaging, microbiology, ancillary and laboratory) are listed  below for reference.    Significant Diagnostic Studies: Dg Chest 2 View  05/29/2012  *RADIOLOGY REPORT*  Clinical Data: Chest pain and shortness of breath.  CHEST - 2 VIEW  Comparison: Chest radiograph performed 05/30/2010  Findings: The lungs are well-aerated and clear.  There is no evidence of focal opacification, pleural effusion or pneumothorax.  The heart is mildly enlarged.  No acute osseous abnormalities are seen.  IMPRESSION: No acute cardiopulmonary process seen; mild cardiomegaly.   Original Report Authenticated By: Santa Lighter, M.D.    Nm Myocar Multi W/spect W/wall Motion / Ef  05/31/2012  Nathan Grant is a 67 yo who was admitted with chest pain.  A 2 day Leane Call was ordered for further evaluation.  The patient received an IV injection of Myoview and the resting Myoview images were  obtained following brief delay.  The following day the patient received an IV injection of Lexiscan. He had no ST-T wave changes during the injection.  A second dose of Myoview was injected after 45 seconds. Quantitated gated SPECT images were obtained following brief delay.  The raw data images reveal minimal motion artifact.  The stress Myoview images reveal fairly smooth and homogeneous uptake in all areas of the myocardium.  The resting Myoview images reveals similar pattern of smooth and homogeneous uptake of all areas of the myocardium.  The cine loop images reveal fairly normal thickening and contractility of the myocardium.  The computer algorithm calculates an end diastolic volume of Q000111Q ml.  The end-systolic volume is 0000000 ml.  Ejection fraction is 37%. Actually into the left ventricular systolic function is higher than the neck - probably in the range of 55-60%.  The computer derived contours do not match  the area of uptake in the left ventricle.  Impression: No evidence of ischemia The normal left ventricular systolic function by visual inspection.  The computer generated left ventricular function  is moderately depressed but I think the computer is clearly underestimating the left ventricular contractility. An echocardiogram performed several days ago confirms normal left ventricular systolic function with an EF of 65-70%.  [name], Brooke Bonito., MD, [facility]  1:19 P:M, Feb. 18, 2014   Original Report Authenticated By: Mertie Moores, M.D.     Microbiology: No results found for this or any previous visit (from the past 240 hour(s)).   Labs: Basic Metabolic Panel:  Recent Labs Lab 05/29/12 0347 05/30/12 0545  NA 136 140  K 3.2* 3.5  CL 94* 98  CO2 36* 33*  GLUCOSE 126* 112*  BUN 14 21  CREATININE 1.28 1.49*  CALCIUM 9.5 8.9   Liver Function Tests:  Recent Labs Lab 05/29/12 0347  AST 30  ALT 20  ALKPHOS 55  BILITOT 0.3  PROT 7.3  ALBUMIN 3.7   No results found for this basename: LIPASE, AMYLASE,  in the last 168 hours No results found for this basename: AMMONIA,  in the last 168 hours CBC:  Recent Labs Lab 05/29/12 0347 05/30/12 0545  WBC 5.8 7.5  NEUTROABS 3.6  --   HGB 12.7* 11.8*  HCT 38.8* 35.5*  MCV 70.5* 70.4*  PLT 274 273   Cardiac Enzymes:  Recent Labs Lab 05/29/12 0347 05/29/12 0725 05/29/12 1004 05/29/12 1355  TROPONINI <0.30 <0.30 <0.30 <0.30   BNP: BNP (last 3 results)  Recent Labs  05/29/12 0725  PROBNP 1249.0*   CBG:  Recent Labs Lab 05/30/12 2044 05/31/12 0603 05/31/12 1120  GLUCAP 112* 97 108*       Signed:  Deedra Pro,CHRISTOPHER  [facility] 05/31/2012, 1:53 PM

## 2012-05-31 NOTE — Care Management Note (Signed)
    Page 1 of 1   05/31/2012     4:12:01 PM   CARE MANAGEMENT NOTE 05/31/2012  Patient:  Nathan Grant, Nathan Grant   Account Number:  [id number]  Date Initiated:  05/31/2012  Documentation initiated by:  Oumar Marcott  Subjective/Objective Assessment:   PT ADM WITH UNSTABLE ANGINA ON 05/29/12.  PTA, PT INDEPENDENT OF ADLS.     Action/Plan:   WILL FOLLOW FOR HOME NEEDS AS PT PROGRESSES.   Anticipated DC Date:  05/31/2012   Anticipated DC Plan:  [place]  CM consult      Choice offered to / List presented to:             Status of service:  Completed, signed off Medicare Important Message given?   (If response is "NO", the following Medicare IM given date fields will be blank) Date Medicare IM given:   Date Additional Medicare IM given:    Discharge Disposition:  HOME/SELF CARE  Per UR Regulation:  Reviewed for med. necessity/level of care/duration of stay  If discussed at [place] of Stay Meetings, dates discussed:    Comments:

## 2012-06-01 ENCOUNTER — Telehealth: Payer: Self-pay | Admitting: Cardiology

## 2012-06-01 NOTE — Telephone Encounter (Signed)
New Problem:    Patient called in, per patient's wife, needing a sooner and earliest in the day appointment to see Dr. Martinique.  When offered 3/20 patient stated that he needed sooner appointment due to his sleep apnea.  Please call back.

## 2012-06-02 NOTE — Telephone Encounter (Signed)
Spoke with patient he stated he needed to be seen sooner than 07/15/12.States he has been wheezing and is sob.States he has a non productive cough at night.Appointment scheduled with Truitt Merle NP 06/07/12

## 2012-06-07 ENCOUNTER — Ambulatory Visit (INDEPENDENT_AMBULATORY_CARE_PROVIDER_SITE_OTHER): Payer: Medicare Other | Admitting: Nurse Practitioner

## 2012-06-07 ENCOUNTER — Encounter: Payer: Self-pay | Admitting: Nurse Practitioner

## 2012-06-07 VITALS — BP 154/80 | HR 44 | Resp 16 | Ht 73.0 in | Wt 338.0 lb

## 2012-06-07 DIAGNOSIS — I4892 Unspecified atrial flutter: Secondary | ICD-10-CM

## 2012-06-07 DIAGNOSIS — I119 Hypertensive heart disease without heart failure: Secondary | ICD-10-CM

## 2012-06-07 NOTE — Patient Instructions (Signed)
Let's arrange for a split night sleep study  I will see you on a month  Stay on your medicines  Avoid salt  Call the [place] office at [phone] if you have any questions, problems or concerns.

## 2012-06-07 NOTE — Progress Notes (Addendum)
Nathan Grant Date of Birth: 10/04/1945 Medical Record [id number]  History of Present Illness: Nathan Grant is seen back today for a post hospital visit. He is seen for Dr. Martinique. He has a history of atrial flutter with past cardioversion in 2012, HTN, obesity ED, microcytic anemia and HLD.   He was most recently admitted with chest pain in the setting of severe HTN. Had stopped his medicines for 4 days prior. Mild enzyme leak but was felt to be due to severe HTN, stable Myoview and an echo showing an EF of 65 to 70%. He was put back on his medicines  He comes in today. Feels great! Back on his medicines. Says he will never do that again. Does have some dry mouth which is probably from his medicines. Only able to take the metoprolol once a day and on .3 of Clonidine. No chest pain. Some wheezing at night. Little cough but overall feels ok. Sees his PCP later this week. Sleep study has been recommended.   Current Outpatient Prescriptions on File Prior to Visit  Medication Sig Dispense Refill  . amLODipine (NORVASC) 10 MG tablet Take 1 tablet (10 mg total) by mouth daily.  30 tablet  11  . aspirin 81 MG tablet Take 1 tablet (81 mg total) by mouth daily.  30 tablet  0  . fish oil-omega-3 fatty acids 1000 MG capsule Take 1 g by mouth 2 (two) times daily.      . furosemide (LASIX) 20 MG tablet Take 1 tablet (20 mg total) by mouth every other day.  30 tablet  6  . losartan-hydrochlorothiazide (HYZAAR) 100-25 MG per tablet Take 1 tablet by mouth daily.  30 tablet  11  . metoprolol tartrate (LOPRESSOR) 25 MG tablet Take 25 mg by mouth daily.       . multivitamin (THERAGRAN) per tablet Take 1 tablet by mouth daily.        . potassium chloride SA (K-DUR,KLOR-CON) 20 MEQ tablet Take 1 tablet (20 mEq total) by mouth 2 (two) times daily.  60 tablet  11  . pravastatin (PRAVACHOL) 20 MG tablet Take 1 tablet (20 mg total) by mouth daily.  30 tablet  5  . Tadalafil (CIALIS PO) Take 20 mg by mouth as needed  (for ED).        No current facility-administered medications on file prior to visit.    No Known Allergies  Past Medical History  Diagnosis Date  . Atrial flutter     s/p cardioversion 06/2010  . HTN (hypertension)   . Morbid obesity   . Osteoarthritis   . Degenerative joint disease      End-stage degenerative joint disease, left knee  . Anemia   . Tobacco chew use      History of chewing tobacco usage.   . Urinary tract infection      Pseudomonas urinary tract infection  . Obesity   . Microcytic anemia  03/22/2001  . Chronic headaches   . Tendonitis   . Erectile dysfunction     Past Surgical History  Procedure Laterality Date  . Total knee arthroplasty  May 2009    Bilateral  . Cardioversion  07/01/2010    History  Smoking status  . Never Smoker   Smokeless tobacco  . Current User  . Types: Chew    History  Alcohol Use No    Family History  Problem Relation Age of Onset  . Alcohol abuse Brother   . Hypertension Brother   .  Hypertension Mother     Review of Systems: The review of systems is per the HPI.  All other systems were reviewed and are negative.  Physical Exam: BP 154/80  Pulse 44  Resp 16  Ht 6\' 1"  (1.854 m)  Wt 338 lb (153.316 kg)  BMI 44.6 kg/m2  SpO2 100% Patient is very pleasant and in no acute distress. He is of large stature and is obese. Skin is warm and dry. Color is normal.  HEENT is unremarkable. Normocephalic/atraumatic. PERRL. Sclera are nonicteric. Neck is supple. No masses. No JVD. Lungs are clear. Cardiac exam shows a regular rate and rhythm. Abdomen is soft. Extremities are without edema. Gait and ROM are intact. No gross neurologic deficits noted.   LABORATORY DATA: Lab Results  Component Value Date   WBC 7.5 05/30/2012   HGB 11.8* 05/30/2012   HCT 35.5* 05/30/2012   PLT 273 05/30/2012   GLUCOSE 112* 05/30/2012   CHOL 142 05/30/2012   TRIG 70 05/30/2012   HDL 54 05/30/2012   LDLCALC 74 05/30/2012   ALT 20 05/29/2012   AST  30 05/29/2012   NA 140 05/30/2012   K 3.5 05/30/2012   CL 98 05/30/2012   CREATININE 1.49* 05/30/2012   BUN 21 05/30/2012   CO2 33* 05/30/2012   INR 2.7 07/21/2010     Assessment / Plan:   1. HTN - improved control although not at goal - I have left him on his current regimen for now. Will see him back in a month  2. Chest pain - negative Myoview. EF is normal by echo  3. Atrial flutter - with past cardioversion. Remains in sinus. EKG shows sinus today as well with a PVC.  4. Obesity  5. Probable sleep apnea - he endorses fatigue;snoring and is obese. Will arrange for split night study  Patient is agreeable to this plan and will call if any problems develop in the interim.   Addendum from 06/21/12  Per [facility] - patient cancel Test.

## 2012-06-27 ENCOUNTER — Encounter (HOSPITAL_BASED_OUTPATIENT_CLINIC_OR_DEPARTMENT_OTHER): Payer: Medicare Other

## 2012-07-06 ENCOUNTER — Ambulatory Visit: Payer: Medicare Other | Admitting: Nurse Practitioner

## 2012-07-12 ENCOUNTER — Telehealth: Payer: Self-pay | Admitting: Cardiology

## 2012-07-15 ENCOUNTER — Encounter: Payer: Medicare Other | Admitting: Cardiology

## 2012-07-29 ENCOUNTER — Encounter: Payer: Self-pay | Admitting: Cardiology

## 2012-08-26 ENCOUNTER — Other Ambulatory Visit: Payer: Self-pay | Admitting: Nurse Practitioner

## 2012-08-29 ENCOUNTER — Telehealth: Payer: Self-pay | Admitting: Cardiology

## 2012-08-29 MED ORDER — LOSARTAN POTASSIUM-HCTZ 100-25 MG PO TABS: 1.0000 | ORAL_TABLET | Freq: Every day | ORAL | Status: DC

## 2012-08-29 MED ORDER — CLONIDINE HCL 0.2 MG PO TABS: 0.3000 mg | ORAL_TABLET | ORAL | Status: DC

## 2012-08-29 MED ORDER — AMLODIPINE BESYLATE 10 MG PO TABS: 10.0000 mg | ORAL_TABLET | Freq: Every day | ORAL | Status: DC

## 2012-08-29 MED ORDER — METOPROLOL TARTRATE 25 MG PO TABS: 25.0000 mg | ORAL_TABLET | Freq: Every day | ORAL | Status: DC

## 2012-08-29 MED ORDER — PRAVASTATIN SODIUM 20 MG PO TABS: 20.0000 mg | ORAL_TABLET | Freq: Every day | ORAL | Status: DC

## 2012-08-29 MED ORDER — FUROSEMIDE 20 MG PO TABS: 20.0000 mg | ORAL_TABLET | ORAL | Status: DC

## 2012-08-29 MED ORDER — POTASSIUM CHLORIDE CRYS ER 20 MEQ PO TBCR: 20.0000 meq | EXTENDED_RELEASE_TABLET | Freq: Two times a day (BID) | ORAL | Status: DC

## 2012-08-29 NOTE — Telephone Encounter (Signed)
New problem    Pt calling about medication

## 2012-08-29 NOTE — Telephone Encounter (Signed)
Returned call to patient he stated he needed refills on his medications.Refills sent to pharmacy.

## 2013-01-15 ENCOUNTER — Emergency Department (HOSPITAL_COMMUNITY): Payer: Medicare Other

## 2013-01-15 ENCOUNTER — Observation Stay (HOSPITAL_COMMUNITY)
Admission: EM | Admit: 2013-01-15 | Discharge: 2013-01-17 | Disposition: A | Discharge status: Home / Self Care | Payer: Medicare Other | Attending: Cardiology | Admitting: Cardiology

## 2013-01-15 ENCOUNTER — Encounter (HOSPITAL_COMMUNITY): Payer: Self-pay | Admitting: Emergency Medicine

## 2013-01-15 DIAGNOSIS — I10 Essential (primary) hypertension: Secondary | ICD-10-CM

## 2013-01-15 DIAGNOSIS — R079 Chest pain, unspecified: Secondary | ICD-10-CM

## 2013-01-15 DIAGNOSIS — I4892 Unspecified atrial flutter: Secondary | ICD-10-CM

## 2013-01-15 DIAGNOSIS — I519 Heart disease, unspecified: Secondary | ICD-10-CM | POA: Insufficient documentation

## 2013-01-15 DIAGNOSIS — M1710 Unilateral primary osteoarthritis, unspecified knee: Secondary | ICD-10-CM | POA: Insufficient documentation

## 2013-01-15 DIAGNOSIS — F17200 Nicotine dependence, unspecified, uncomplicated: Secondary | ICD-10-CM | POA: Insufficient documentation

## 2013-01-15 DIAGNOSIS — I4729 Other ventricular tachycardia: Secondary | ICD-10-CM

## 2013-01-15 DIAGNOSIS — M779 Enthesopathy, unspecified: Secondary | ICD-10-CM | POA: Insufficient documentation

## 2013-01-15 DIAGNOSIS — Z6841 Body Mass Index (BMI) 40.0 and over, adult: Secondary | ICD-10-CM | POA: Insufficient documentation

## 2013-01-15 DIAGNOSIS — R001 Bradycardia, unspecified: Secondary | ICD-10-CM

## 2013-01-15 DIAGNOSIS — I472 Ventricular tachycardia, unspecified: Secondary | ICD-10-CM | POA: Insufficient documentation

## 2013-01-15 DIAGNOSIS — I119 Hypertensive heart disease without heart failure: Principal | ICD-10-CM | POA: Diagnosis present

## 2013-01-15 DIAGNOSIS — I5189 Other ill-defined heart diseases: Secondary | ICD-10-CM

## 2013-01-15 DIAGNOSIS — I200 Unstable angina: Secondary | ICD-10-CM

## 2013-01-15 DIAGNOSIS — N529 Male erectile dysfunction, unspecified: Secondary | ICD-10-CM | POA: Insufficient documentation

## 2013-01-15 DIAGNOSIS — I498 Other specified cardiac arrhythmias: Secondary | ICD-10-CM

## 2013-01-15 DIAGNOSIS — E876 Hypokalemia: Secondary | ICD-10-CM | POA: Diagnosis present

## 2013-01-15 DIAGNOSIS — Z79899 Other long term (current) drug therapy: Secondary | ICD-10-CM | POA: Insufficient documentation

## 2013-01-15 HISTORY — DX: Ventricular tachycardia: I47.2

## 2013-01-15 HISTORY — DX: Hypertensive heart disease without heart failure: I11.9

## 2013-01-15 HISTORY — DX: Bradycardia, unspecified: R00.1

## 2013-01-15 HISTORY — DX: Other ventricular tachycardia: I47.29

## 2013-01-15 LAB — BASIC METABOLIC PANEL
BUN: 12 mg/dL (ref 6–23)
CO2: 33 mEq/L — ABNORMAL HIGH (ref 19–32)
Calcium: 9.2 mg/dL (ref 8.4–10.5)
Chloride: 97 mEq/L (ref 96–112)
Creatinine, Ser: 1.03 mg/dL (ref 0.50–1.35)
GFR calc Af Amer: 84 mL/min — ABNORMAL LOW (ref >90)
GFR calc non Af Amer: 73 mL/min — ABNORMAL LOW (ref >90)
Glucose, Bld: 132 mg/dL — ABNORMAL HIGH (ref 70–99)
Potassium: 3 mEq/L — ABNORMAL LOW (ref 3.5–5.1)

## 2013-01-15 LAB — CBC WITH DIFFERENTIAL/PLATELET
Eosinophils Relative: 1 % (ref 0–5)
MCV: 69.7 fL — ABNORMAL LOW (ref 78.0–100.0)
Monocytes Relative: 8 % (ref 3–12)
Neutrophils Relative %: 57 % (ref 43–77)
Platelets: 269 10*3/uL (ref 150–400)
RBC: 5.65 MIL/uL (ref 4.22–5.81)
WBC: 4.5 10*3/uL (ref 4.0–10.5)

## 2013-01-15 MED ORDER — POTASSIUM CHLORIDE CRYS ER 20 MEQ PO TBCR
40.0000 meq | EXTENDED_RELEASE_TABLET | Freq: Once | ORAL | Status: AC
  Administered 2013-01-15: 40 meq via ORAL
  Filled 2013-01-15: qty 2

## 2013-01-15 MED ORDER — ACETAMINOPHEN 325 MG PO TABS: 650.0000 mg | ORAL_TABLET | ORAL | Status: DC | PRN

## 2013-01-15 MED ORDER — AMLODIPINE BESYLATE 10 MG PO TABS
10.0000 mg | ORAL_TABLET | Freq: Every day | ORAL | Status: DC
  Administered 2013-01-15 – 2013-01-17 (×3): 10 mg via ORAL
  Filled 2013-01-15 (×3): qty 1

## 2013-01-15 MED ORDER — CLONIDINE HCL 0.2 MG PO TABS
0.2000 mg | ORAL_TABLET | Freq: Two times a day (BID) | ORAL | Status: DC
  Administered 2013-01-15 – 2013-01-17 (×4): 0.2 mg via ORAL
  Filled 2013-01-15 (×5): qty 1

## 2013-01-15 MED ORDER — ASPIRIN 81 MG PO TABS: 81.0000 mg | ORAL_TABLET | Freq: Every day | ORAL | Status: DC

## 2013-01-15 MED ORDER — ASPIRIN 81 MG PO CHEW: 324.0000 mg | CHEWABLE_TABLET | ORAL | Status: AC

## 2013-01-15 MED ORDER — NITROGLYCERIN 0.4 MG SL SUBL: 0.4000 mg | SUBLINGUAL_TABLET | SUBLINGUAL | Status: DC | PRN

## 2013-01-15 MED ORDER — ASPIRIN 81 MG PO CHEW
243.0000 mg | CHEWABLE_TABLET | Freq: Once | ORAL | Status: AC
  Administered 2013-01-15: 243 mg via ORAL
  Filled 2013-01-15: qty 3

## 2013-01-15 MED ORDER — ASPIRIN 300 MG RE SUPP: 300.0000 mg | RECTAL | Status: AC

## 2013-01-15 MED ORDER — ENOXAPARIN SODIUM 40 MG/0.4ML ~~LOC~~ SOLN
40.0000 mg | SUBCUTANEOUS | Status: DC
  Administered 2013-01-15: 40 mg via SUBCUTANEOUS
  Filled 2013-01-15 (×2): qty 0.4

## 2013-01-15 MED ORDER — HYDROCHLOROTHIAZIDE 25 MG PO TABS
25.0000 mg | ORAL_TABLET | Freq: Every day | ORAL | Status: DC
  Administered 2013-01-15 – 2013-01-17 (×2): 25 mg via ORAL
  Filled 2013-01-15 (×3): qty 1

## 2013-01-15 MED ORDER — NITROGLYCERIN 2 % TD OINT
1.0000 [in_us] | TOPICAL_OINTMENT | Freq: Once | TRANSDERMAL | Status: AC
  Administered 2013-01-15: 1 [in_us] via TOPICAL
  Filled 2013-01-15: qty 1

## 2013-01-15 MED ORDER — ASPIRIN EC 81 MG PO TBEC
81.0000 mg | DELAYED_RELEASE_TABLET | Freq: Every day | ORAL | Status: DC
  Filled 2013-01-15: qty 1

## 2013-01-15 MED ORDER — SIMVASTATIN 20 MG PO TABS
20.0000 mg | ORAL_TABLET | Freq: Every day | ORAL | Status: DC
  Administered 2013-01-15 – 2013-01-16 (×2): 20 mg via ORAL
  Filled 2013-01-15 (×3): qty 1

## 2013-01-15 MED ORDER — POTASSIUM CHLORIDE CRYS ER 20 MEQ PO TBCR
40.0000 meq | EXTENDED_RELEASE_TABLET | Freq: Two times a day (BID) | ORAL | Status: AC
  Administered 2013-01-15 (×2): 40 meq via ORAL
  Filled 2013-01-15: qty 2

## 2013-01-15 MED ORDER — MORPHINE SULFATE 2 MG/ML IJ SOLN: 2.0000 mg | INTRAMUSCULAR | Status: DC | PRN

## 2013-01-15 MED ORDER — ONDANSETRON HCL 4 MG/2ML IJ SOLN: 4.0000 mg | Freq: Four times a day (QID) | INTRAMUSCULAR | Status: DC | PRN

## 2013-01-15 MED ORDER — OLMESARTAN-AMLODIPINE-HCTZ 40-10-25 MG PO TABS: 1.0000 | ORAL_TABLET | Freq: Every day | ORAL | Status: DC

## 2013-01-15 MED ORDER — IRBESARTAN 300 MG PO TABS
300.0000 mg | ORAL_TABLET | Freq: Every day | ORAL | Status: DC
  Administered 2013-01-15 – 2013-01-17 (×3): 300 mg via ORAL
  Filled 2013-01-15 (×3): qty 1

## 2013-01-15 NOTE — ED Notes (Signed)
Attempted to give report to nurse on 3w, nurse states she is off the floor at lunch. When asked if I could give report to charge, nurse informed me charge was busy at this time. Nurse to call back after she returns.

## 2013-01-15 NOTE — ED Notes (Signed)
Pt. Stated, when I got up this morning I had chest pain right in the center of my chest, SOB. I went on to church but had to leave because of the pain. Non radiating, states pain is right in the center

## 2013-01-15 NOTE — ED Provider Notes (Signed)
CSN: [id number]     Arrival date & time 01/15/13  1022 History   First MD Initiated Contact with Patient 01/15/13 1033     Chief Complaint  Patient presents with  . Chest Pain   (Consider location/radiation/quality/duration/timing/severity/associated sxs/prior Treatment) HPI Comments: Patient with history of atrial flutter status post cardioversion, obesity, hypertension, hyperlipidemia, nuclear stress test performed during admission in 05/2012 showing no inducible ischemia -- presents with complaint of chest pain, midsternal, pressure sensation that began very mildly after waking this morning but became much worse while preparing communion at church. Patient states that he felt lightheaded but did not pass out. He had some nausea and shortness of breath but no vomiting. Chest pain began at approximately 7:30 AM and was at its most severe approximately 945 to 10 AM. Pain has since gradually resolved. Patient denies recent fever, cough, abdominal pain, back pain, urinary symptoms. Patient took his prescribed 81 mg aspirin this morning. He has never had a cardiac catheterization. Patient states that he has similar chest pain at times upon standing.  Patient is a 67 y.o. male presenting with chest pain. The history is provided by the patient and medical records.  Chest Pain Associated symptoms: nausea and shortness of breath   Associated symptoms: no abdominal pain, no back pain, no cough, no diaphoresis, no fever, no palpitations and not vomiting     Past Medical History  Diagnosis Date  . Atrial flutter     s/p cardioversion 06/2010  . HTN (hypertension)   . Morbid obesity   . Osteoarthritis   . Degenerative joint disease      End-stage degenerative joint disease, left knee  . Anemia   . Tobacco chew use      History of chewing tobacco usage.   . Urinary tract infection      Pseudomonas urinary tract infection  . Obesity   . Microcytic anemia  03/22/2001  . Chronic headaches   .  Tendonitis   . Erectile dysfunction    Past Surgical History  Procedure Laterality Date  . Total knee arthroplasty  May 2009    Bilateral  . Cardioversion  07/01/2010   Family History  Problem Relation Age of Onset  . Alcohol abuse Brother   . Hypertension Brother   . Hypertension Mother    History  Substance Use Topics  . Smoking status: Never Smoker   . Smokeless tobacco: Current User    Types: Chew  . Alcohol Use: No    Review of Systems  Constitutional: Negative for fever and diaphoresis.  HENT: Negative for neck pain.   Eyes: Negative for redness.  Respiratory: Positive for shortness of breath. Negative for cough.   Cardiovascular: Positive for chest pain and leg swelling (baseline). Negative for palpitations.  Gastrointestinal: Positive for nausea. Negative for vomiting and abdominal pain.  Genitourinary: Negative for dysuria.  Musculoskeletal: Negative for back pain.  Skin: Negative for rash.  Neurological: Positive for light-headedness. Negative for syncope.    Allergies  Review of patient's allergies indicates no known allergies.  Home Medications   Current Outpatient Rx  Name  Route  Sig  Dispense  Refill  . aspirin 81 MG tablet   Oral   Take 1 tablet (81 mg total) by mouth daily.   30 tablet   0   . furosemide (LASIX) 20 MG tablet   Oral   Take 1 tablet (20 mg total) by mouth every other day.   30 tablet   6   .  losartan-hydrochlorothiazide (HYZAAR) 100-25 MG per tablet   Oral   Take 1 tablet by mouth daily.   30 tablet   6   . pravastatin (PRAVACHOL) 20 MG tablet   Oral   Take 1 tablet (20 mg total) by mouth daily.   30 tablet   6    BP 152/90  Pulse 88  Temp(Src) 98.4 F (36.9 C) (Oral)  Resp 17  Ht 6\' 1"  (1.854 m)  Wt 321 lb (145.605 kg)  BMI 42.36 kg/m2  SpO2 96% Physical Exam  Nursing note and vitals reviewed. Constitutional: He appears well-developed and well-nourished.  HENT:  Head: Normocephalic and atraumatic.   Mouth/Throat: Mucous membranes are normal. Mucous membranes are not dry.  Eyes: Conjunctivae are normal.  Neck: Trachea normal and normal range of motion. Neck supple. Normal carotid pulses and no JVD present. No muscular tenderness present. Carotid bruit is not present. No tracheal deviation present.  Cardiovascular: Normal rate, regular rhythm, S1 normal, S2 normal, normal heart sounds and intact distal pulses.  Exam reveals no distant heart sounds and no decreased pulses.   No murmur heard. Pulmonary/Chest: Effort normal and breath sounds normal. No respiratory distress. He has no wheezes. He exhibits no tenderness.  Abdominal: Soft. Normal aorta and bowel sounds are normal. There is no tenderness. There is no rebound and no guarding.  Musculoskeletal: He exhibits edema.  1-2+ pitting edema, symmetric, bilaterally 2 mid calves.  Neurological: He is alert.  Skin: Skin is warm and dry. He is not diaphoretic. No cyanosis. No pallor.  Psychiatric: He has a normal mood and affect.    ED Course  Procedures (including critical care time) Labs Review Labs Reviewed  CBC WITH DIFFERENTIAL - Abnormal; Notable for the following:    MCV 69.7 (*)    MCH 23.2 (*)    All other components within normal limits  BASIC METABOLIC PANEL - Abnormal; Notable for the following:    Potassium 3.0 (*)    CO2 33 (*)    Glucose, Bld 132 (*)    GFR calc non Af Amer 73 (*)    GFR calc Af Amer 84 (*)    All other components within normal limits  POCT I-STAT TROPONIN I   Imaging Review Dg Chest 2 View  01/15/2013   CLINICAL DATA:  Chest pain.  EXAM: CHEST  2 VIEW  COMPARISON:  05/29/2012  FINDINGS: The mediastinal contours are within normal limits. Mild stable cardiomegaly. Both lungs are clear. The visualized skeletal structures are unremarkable.  IMPRESSION: No active cardiopulmonary disease.   Electronically Signed   By: Marin Olp M.D.   On: 01/15/2013 12:10    11:01 AM Patient seen and examined. D/w  Dr. Eulis Foster. Work-up initiated. Medications ordered.   Vital signs reviewed and are as follows: Filed Vitals:   01/15/13 1031  BP: 152/90  Pulse: 88  Temp: 98.4 F (36.9 C)  Resp: 17    Date: 01/15/2013  Rate: 98  Rhythm: normal sinus rhythm with PAC/PVC  QRS Axis: normal  Intervals: normal  ST/T Wave abnormalities: nonspecific T wave changes  Conduction Disutrbances:none  Narrative Interpretation:   Old EKG Reviewed: changed from 06/07/2012 -- improvement in lateral t-wave inversion  Spoke with cardiology who will see.   2:32 PM Cards has seen and patient to be admitted.    MDM   1. Chest pain    CP admit.     Carlisle Cater, PA-C 01/15/13 1433

## 2013-01-15 NOTE — H&P (Signed)
Primary cardiologist: Dr. Peter Martinique  Clinical Summary Mr. Nathan Grant is a 67 y.o.male 67 yo male history of atrial flutter, HTN, tobacco abuse, HL presents with chest pain. Recent admit 05/2012 with exertional chest pain, no acute ischemic changes on EKG with negative troponins at that time. Myoview done showed no evidence of ischemia, echo showed normal LVEF 65-70% with no wall motion abnormatlities.  - patient reports since Feb 2014 admit has continued to have exertional chest pain. States that prior to Feb he could walk several miles without troubles. Now reports he gets chest tightness at appox 2 blocks. Since February reports symptoms occur somewhat more frequently, no change in severity or level of exertion for onset. Reports today while at [facility] he had an episode of chest pain while standing. 10/10 sharp pain mid chest, + SOB +palps no n/v, no diaphoresis. Lasted approx 30 minutes and resolved on its own. Reports first episode that has occurred at rest.   - EKG shows NSR, frequent atrial ectopy, occas PVCs, no specific ischemic changes. Troponins neg x 1. Patient currently chest pain free.       No Known Allergies  Home Medications ASA 81 mg po qday Lasix 20mg  po qday Olmesartan 40/Amlodpine 10/ HCTZ 25 mg daily (Tribenzor) Pravastatin 20mg  daily Clonidine 0.2mg  bid    Past Medical History  Diagnosis Date  . Atrial flutter     s/p cardioversion 06/2010  . HTN (hypertension)   . Morbid obesity   . Osteoarthritis   . Degenerative joint disease      End-stage degenerative joint disease, left knee  . Anemia   . Tobacco chew use      History of chewing tobacco usage.   . Urinary tract infection      Pseudomonas urinary tract infection  . Obesity   . Microcytic anemia  03/22/2001  . Chronic headaches   . Tendonitis   . Erectile dysfunction     Past Surgical History  Procedure Laterality Date  . Total knee arthroplasty  May 2009    Bilateral  . Cardioversion   07/01/2010    Family History  Problem Relation Age of Onset  . Alcohol abuse Brother   . Hypertension Brother   . Hypertension Mother     Social History Mr. Walthers reports that he has never smoked. His smokeless tobacco use includes Chew. Mr. Rashed reports that he does not drink alcohol.  Review of Systems 10 point ROS negative than reported in HPI  Physical Examination Temp:  [98.4 F (36.9 C)] 98.4 F (36.9 C) (10/05 1031) Pulse Rate:  [88] 88 (10/05 1031) Resp:  [17] 17 (10/05 1031) BP: (152)/(90) 152/90 mmHg (10/05 1031) SpO2:  [96 %] 96 % (10/05 1031) Weight:  [321 lb (145.605 kg)] 321 lb (145.605 kg) (10/05 1029) No intake or output data in the 24 hours ending 01/15/13 1250  Gen: NAD HEENT: sclera clear, sclera clear, no palpable adenopathy CV: RRR, occas skipped beats, no JVD, no carotid bruits Resp: CTAB GI: abdomen soft, NT, ND, NABS Neuro: no focal deficits Psych: appropriate affect Skin: no rash MSK: warm, 2+ bilateral edema   Lab Results  Basic Metabolic Panel:  Recent Labs Lab 01/15/13 1109  NA 139  K 3.0*  CL 97  CO2 33*  GLUCOSE 132*  BUN 12  CREATININE 1.03  CALCIUM 9.2    Liver Function Tests: No results found for this basename: AST, ALT, ALKPHOS, BILITOT, PROT, ALBUMIN,  in the last 168 hours  CBC:  Recent Labs Lab 01/15/13 1109  WBC 4.5  NEUTROABS 2.6  HGB 13.1  HCT 39.4  MCV 69.7*  PLT 269    Cardiac Enzymes: No results found for this basename: CKTOTAL, CKMB, CKMBINDEX, TROPONINI,  in the last 168 hours  BNP: No components found with this basename: POCBNP,    ECG: EKG shows NSR, frequent atrial ectopy, occas PVCs, no specific ischemic changes   Imaging 05/2012 Echo: LVEF 65-70%, severe LVH w/ asymmetrical hypertrophy, grade I diastolic dysfunction, mod LAE  05/2012 Lexiscan Myoview: no ischemia   01/15/13 CXR FINDINGS:  The mediastinal contours are within normal limits. Mild stable  cardiomegaly. Both  lungs are clear. The visualized skeletal  structures are unremarkable.  IMPRESSION:  No active cardiopulmonary disease.     Assessment/Plan  1. Chest pain - unclear etiology, symptoms concerning for possible angina. Negative myoview in 05/2012, however multiple risk factors with fairly good story for angina that may be progressing - admit to telemetry, cycle cardiac enzymes - continue ASA and statin. - TIMI risk score is 2, will not anticoagulate at this point unless recurrent symptoms or rules in - assess in AM and discuss with primary cardiologist Dr Martinique, may consider definitive cath in this patient with multiple CAD risk factors with history supportive of possible angina with negative myoview and continued symptoms.  2. Aflutter -s/p prior cardioversion in 2012 - sinus rhythm with frequent PACs - monitor on telemetry  3. Diastolic dysfunction - has some evidence of lower extremity edema, but otherwise normal exam. No significant symptoms. Hold lasix today in case he goes for cath tomorrow.    4. Hypokalemia - will replace orally   5. HTN - resume home regimen   Carlyle Dolly, M.D., F.A.C.C.

## 2013-01-15 NOTE — ED Provider Notes (Signed)
Nathan Grant is a 67 y.o. male   Face-to-face evaluation   History: Chest pain, waxing and waning this morning, exertional component. History of same. Pain-free in the emergency department  Physical exam: Toxic calm, and comfortable. Heart regular in rhythm or murmur. Lungs clear to auscultation.  Assessment- chest pain with stress test 2 years ago. That was reassuring. He may have had progression of coronary artery disease in the interim and will need admission for further observation and treatment and testing.  Medical screening examination/treatment/procedure(s) were conducted as a shared visit with non-physician practitioner(s) and myself.  I personally evaluated the patient during the encounter  Richarda Blade, MD 01/15/13 [phone]

## 2013-01-15 NOTE — ED Notes (Signed)
Iv team called to access site . Two nurse attempted unable to due to poor access.[name]

## 2013-01-16 ENCOUNTER — Encounter (HOSPITAL_COMMUNITY)
Admission: EM | Disposition: A | Discharge status: Home / Self Care | Payer: Self-pay | Source: Home / Self Care | Attending: Emergency Medicine

## 2013-01-16 DIAGNOSIS — R079 Chest pain, unspecified: Secondary | ICD-10-CM

## 2013-01-16 DIAGNOSIS — I119 Hypertensive heart disease without heart failure: Secondary | ICD-10-CM

## 2013-01-16 DIAGNOSIS — I4892 Unspecified atrial flutter: Secondary | ICD-10-CM

## 2013-01-16 HISTORY — PX: LEFT HEART CATHETERIZATION WITH CORONARY ANGIOGRAM: SHX5451

## 2013-01-16 LAB — BASIC METABOLIC PANEL
BUN: 17 mg/dL (ref 6–23)
CO2: 29 mEq/L (ref 19–32)
Chloride: 100 mEq/L (ref 96–112)
GFR calc Af Amer: 74 mL/min — ABNORMAL LOW (ref >90)
GFR calc non Af Amer: 64 mL/min — ABNORMAL LOW (ref >90)
Glucose, Bld: 110 mg/dL — ABNORMAL HIGH (ref 70–99)
Potassium: 4.4 mEq/L (ref 3.5–5.1)
Sodium: 139 mEq/L (ref 135–145)

## 2013-01-16 LAB — TROPONIN I: Troponin I: <0.3 ng/mL (ref <0.30)

## 2013-01-16 LAB — PROTIME-INR: INR: 1.11 (ref 0.00–1.49)

## 2013-01-16 SURGERY — LEFT HEART CATHETERIZATION WITH CORONARY ANGIOGRAM
Anesthesia: LOCAL

## 2013-01-16 MED ORDER — LIDOCAINE HCL (PF) 1 % IJ SOLN
INTRAMUSCULAR | Status: AC
  Filled 2013-01-16: qty 30

## 2013-01-16 MED ORDER — SODIUM CHLORIDE 0.9 % IV SOLN
INTRAVENOUS | Status: DC
  Administered 2013-01-16: 10:00:00 via INTRAVENOUS

## 2013-01-16 MED ORDER — HEPARIN SODIUM (PORCINE) 1000 UNIT/ML IJ SOLN
INTRAMUSCULAR | Status: AC
  Filled 2013-01-16: qty 1

## 2013-01-16 MED ORDER — ASPIRIN 81 MG PO CHEW: 81.0000 mg | CHEWABLE_TABLET | ORAL | Status: DC

## 2013-01-16 MED ORDER — SODIUM CHLORIDE 0.9 % IJ SOLN: 3.0000 mL | INTRAMUSCULAR | Status: DC | PRN

## 2013-01-16 MED ORDER — HEPARIN (PORCINE) IN NACL 2-0.9 UNIT/ML-% IJ SOLN
INTRAMUSCULAR | Status: AC
  Filled 2013-01-16: qty 1000

## 2013-01-16 MED ORDER — VERAPAMIL HCL 2.5 MG/ML IV SOLN
INTRAVENOUS | Status: AC
  Filled 2013-01-16: qty 2

## 2013-01-16 MED ORDER — ASPIRIN EC 81 MG PO TBEC
81.0000 mg | DELAYED_RELEASE_TABLET | Freq: Every day | ORAL | Status: DC
  Administered 2013-01-17: 81 mg via ORAL
  Filled 2013-01-16: qty 1

## 2013-01-16 MED ORDER — MIDAZOLAM HCL 2 MG/2ML IJ SOLN
INTRAMUSCULAR | Status: AC
  Filled 2013-01-16: qty 2

## 2013-01-16 MED ORDER — HYDRALAZINE HCL 25 MG PO TABS
25.0000 mg | ORAL_TABLET | Freq: Three times a day (TID) | ORAL | Status: DC
  Administered 2013-01-16 – 2013-01-17 (×2): 25 mg via ORAL
  Filled 2013-01-16 (×5): qty 1

## 2013-01-16 MED ORDER — SODIUM CHLORIDE 0.9 % IJ SOLN
3.0000 mL | Freq: Two times a day (BID) | INTRAMUSCULAR | Status: DC
  Administered 2013-01-16: 3 mL via INTRAVENOUS

## 2013-01-16 MED ORDER — SODIUM CHLORIDE 0.9 % IV SOLN: 250.0000 mL | INTRAVENOUS | Status: DC | PRN

## 2013-01-16 MED ORDER — SODIUM CHLORIDE 0.9 % IV SOLN: INTRAVENOUS | Status: AC

## 2013-01-16 MED ORDER — NITROGLYCERIN 0.2 MG/ML ON CALL CATH LAB
INTRAVENOUS | Status: AC
  Filled 2013-01-16: qty 1

## 2013-01-16 MED ORDER — FENTANYL CITRATE 0.05 MG/ML IJ SOLN
INTRAMUSCULAR | Status: AC
  Filled 2013-01-16: qty 2

## 2013-01-16 MED ORDER — ASPIRIN 81 MG PO CHEW
324.0000 mg | CHEWABLE_TABLET | ORAL | Status: AC
  Administered 2013-01-16: 324 mg via ORAL
  Filled 2013-01-16: qty 4

## 2013-01-16 MED ORDER — DIAZEPAM 5 MG PO TABS
5.0000 mg | ORAL_TABLET | ORAL | Status: AC
  Administered 2013-01-16: 5 mg via ORAL
  Filled 2013-01-16: qty 1

## 2013-01-16 NOTE — Interval H&P Note (Signed)
History and Physical Interval Note:  01/16/2013 4:26 PM  Nathan Grant  has presented today for surgery, with the diagnosis of cp  The various methods of treatment have been discussed with the patient and family. After consideration of risks, benefits and other options for treatment, the patient has consented to  Procedure(s): LEFT HEART CATHETERIZATION WITH CORONARY ANGIOGRAM (N/A) as a surgical intervention .  The patient's history has been reviewed, patient examined, no change in status, stable for surgery.  I have reviewed the patient's chart and labs.  Questions were answered to the patient's satisfaction.     Darlen Gledhill Martinique MD,FACC 01/16/2013 4:26 PM

## 2013-01-16 NOTE — Care Management Note (Signed)
    Page 1 of 1   01/16/2013     2:06:34 PM   CARE MANAGEMENT NOTE 01/16/2013  Patient:  Nathan Grant, Nathan Grant   Account Number:  [id number]  Date Initiated:  01/16/2013  Documentation initiated by:  GRAVES-BIGELOW,Jelisha Weed  Subjective/Objective Assessment:   Pt admited for cp.     Action/Plan:   Pt has medicare listed for insurance and CM received referral for medication assistance. CM did make pt aware of pharmacies that have a cheaper medication cost. He currently uses [facility].   Anticipated DC Date:  01/17/2013   Anticipated DC Plan:  [place]  CM consult      Choice offered to / List presented to:             Status of service:  Completed, signed off Medicare Important Message given?   (If response is "NO", the following Medicare IM given date fields will be blank) Date Medicare IM given:   Date Additional Medicare IM given:    Discharge Disposition:  HOME/SELF CARE  Per UR Regulation:  Reviewed for med. necessity/level of care/duration of stay  If discussed at [place] of Stay Meetings, dates discussed:    Comments:

## 2013-01-16 NOTE — Progress Notes (Signed)
Patient Name: Nathan Grant Date of Encounter: 01/16/2013  Principal Problem:   Intermediate coronary syndrome    SUBJECTIVE: No chest pain  OBJECTIVE Filed Vitals:   01/15/13 1600 01/15/13 1913 01/15/13 2049 01/16/13 0601  BP: 170/127 153/107 161/74 143/78  Pulse:  85 67 60  Temp: 98.5 F (36.9 C) 98.8 F (37.1 C) 98.4 F (36.9 C) 98.3 F (36.8 C)  TempSrc:  Oral Oral Oral  Resp: 18 20 18 18   Height:      Weight:      SpO2: 98% 98% 98% 97%    Intake/Output Summary (Last 24 hours) at 01/16/13 0914 Last data filed at 01/15/13 1800  Gross per 24 hour  Intake    360 ml  Output      0 ml  Net    360 ml   Filed Weights   01/15/13 1029  Weight: 321 lb (145.605 kg)    PHYSICAL EXAM General: Well developed, well nourished, male in no acute distress. Head: Normocephalic, atraumatic.  Neck: Supple without bruits, JVD not elevated. Lungs:  Resp regular and unlabored, CTA. Heart: RRR, S1, S2, no S3, S4, 2/6 murmur; no rub. Abdomen: Soft, non-tender, non-distended, BS + x 4.  Extremities: No clubbing, cyanosis, 1+ edema.  Neuro: Alert and oriented X 3. Moves all extremities spontaneously. Psych: Normal affect.  LABS: CBC:  Recent Labs  01/15/13 1109  WBC 4.5  NEUTROABS 2.6  HGB 13.1  HCT 39.4  MCV 69.7*  PLT 269   INR:No results found for this basename: INR,  in the last 72 hours Basic Metabolic Panel:  Recent Labs  01/15/13 1109  NA 139  K 3.0*  CL 97  CO2 33*  GLUCOSE 132*  BUN 12  CREATININE 1.03  CALCIUM 9.2   Cardiac Enzymes:  Recent Labs  01/15/13 1715 01/15/13 2345  TROPONINI <0.30 <0.30    Recent Labs  01/15/13 1129  TROPIPOC 0.04    TELE:   SR, S brady, NSVT, accel junct rhythm     ECG: 16-Jan-2013 04:41:45  Unusual P axis, possible ectopic atrial rhythm Minimal voltage criteria for LVH, may be normal variant T wave abnormality, consider lateral ischemia Prolonged QT Vent. rate 63 BPM PR interval 162 ms QRS  duration 102 ms QT/QTc 466/476 ms P-R-T axes 262 7 111  Radiology/Studies: Dg Chest 2 View 01/15/2013   CLINICAL DATA:  Chest pain.  EXAM: CHEST  2 VIEW  COMPARISON:  05/29/2012  FINDINGS: The mediastinal contours are within normal limits. Mild stable cardiomegaly. Both lungs are clear. The visualized skeletal structures are unremarkable.  IMPRESSION: No active cardiopulmonary disease.   Electronically Signed   By: Marin Olp M.D.   On: 01/15/2013 12:10     Current Medications:  . amLODipine  10 mg Oral Daily  . aspirin  324 mg Oral NOW   Or  . aspirin  300 mg Rectal NOW  . aspirin EC  81 mg Oral Daily  . cloNIDine  0.2 mg Oral BID  . enoxaparin (LOVENOX) injection  40 mg Subcutaneous Q24H  . hydrochlorothiazide  25 mg Oral Daily  . irbesartan  300 mg Oral Daily  . simvastatin  20 mg Oral q1800      ASSESSMENT AND PLAN: Principal Problem:   Intermediate coronary syndrome - pain-free on medical therapy, ECG not acute. However, symptoms are progressive and increasing in intensity. Cardiac cath indicated. The risks and benefits of a cardiac catheterization including, but not limited to, death,  stroke, MI, kidney damage and bleeding were discussed with the patient who indicates understanding and agrees to proceed.   HTN - will leave management to MD, consider adding hydralazine or increasing clonidine since he is already on high doses of ARB and amlodipine.   Arrhythmia - NSVT, but also has accel junct and some sinus brady. MD advise on BB.   LE edema - Chronic, continue HCTZ after cath.   Signed, Rosaria Ferries , PA-C 9:14 AM 01/16/2013 Patient seen and examined and history reviewed. Agree with above findings and plan. Patient denies recurrent chest pain since admission. Yesterday was the worst pain that he has had. Monitor reveals runs of idioventricular rhythm. BP control has been a chronic problem. Given recurrent chest pain and convincing history I have recommended proceeding  with cardiac cath today. The procedure and risks were reviewed including but not limited to death, myocardial infarction, stroke, arrythmias, bleeding, transfusion, emergency surgery, dye allergy, or renal dysfunction. The patient voices understanding and is agreeable to proceed.  BP has improved. Will need to address regimen depending on cath results.  [name] [facility]  01/16/2013 9:34 AM

## 2013-01-16 NOTE — Interval H&P Note (Signed)
History and Physical Interval Note:  01/16/2013 4:27 PM  Nathan Grant  has presented today for surgery, with the diagnosis of cp  The various methods of treatment have been discussed with the patient and family. After consideration of risks, benefits and other options for treatment, the patient has consented to  Procedure(s): LEFT HEART CATHETERIZATION WITH CORONARY ANGIOGRAM (N/A) as a surgical intervention .  The patient's history has been reviewed, patient examined, no change in status, stable for surgery.  I have reviewed the patient's chart and labs.  Questions were answered to the patient's satisfaction.    Cath Lab Visit (complete for each Cath Lab visit)  Clinical Evaluation Leading to the Procedure:   ACS: yes    Anginal Classification: CCS III  Anti-ischemic medical therapy: Maximal Therapy (2 or more classes of medications)  Non-Invasive Test Results: No non-invasive testing performed  Prior CABG: No previous CABG       Peter Martinique MD

## 2013-01-16 NOTE — CV Procedure (Signed)
    Cardiac Catheterization Procedure Note  Name: Nathan Grant MRN: [id number] DOB: 1945-08-27  Procedure: Left Heart Cath, Selective Coronary Angiography, LV angiography  Indication: 67 yo BM with history of severe HTN presents with symptoms of increasing chest pain.   Procedural Details: The right wrist was prepped, draped, and anesthetized with 1% lidocaine. Using the modified Seldinger technique, a 5 French sheath was introduced into the right radial artery. 3 mg of verapamil was administered through the sheath, weight-based unfractionated heparin was administered intravenously. Standard Judkins catheters were used for selective coronary angiography and left ventriculography. Catheter exchanges were performed over an exchange length guidewire. There were no immediate procedural complications. A TR band was used for radial hemostasis at the completion of the procedure.  The patient was transferred to the post catheterization recovery area for further monitoring.  Procedural Findings: Hemodynamics: AO 145/77 mean 103 mm Hg LV 150/20 mm Hg  Coronary angiography: Coronary dominance: right  Left mainstem: Normal  Left anterior descending (LAD): Very large, normal.  Left circumflex (LCx): large, 3 OM branches, normal.  Right coronary artery (RCA): Very large, normal.  Left ventriculography: Left ventricular systolic function is normal, LVEF is estimated at 60-65%, there is no significant mitral regurgitation   Final Conclusions:   1. Normal coronary anatomy. 2. Normal LV function.   Recommendations: Medical therapy with focus on BP control. Will add hydralazine to his prior meds.  [name] [facility] 01/16/2013, 5:01 PM

## 2013-01-16 NOTE — H&P (View-Only) (Signed)
Patient Name: Nathan Grant Date of Encounter: 01/16/2013  Principal Problem:   Intermediate coronary syndrome    SUBJECTIVE: No chest pain  OBJECTIVE Filed Vitals:   01/15/13 1600 01/15/13 1913 01/15/13 2049 01/16/13 0601  BP: 170/127 153/107 161/74 143/78  Pulse:  85 67 60  Temp: 98.5 F (36.9 C) 98.8 F (37.1 C) 98.4 F (36.9 C) 98.3 F (36.8 C)  TempSrc:  Oral Oral Oral  Resp: 18 20 18 18   Height:      Weight:      SpO2: 98% 98% 98% 97%    Intake/Output Summary (Last 24 hours) at 01/16/13 0914 Last data filed at 01/15/13 1800  Gross per 24 hour  Intake    360 ml  Output      0 ml  Net    360 ml   Filed Weights   01/15/13 1029  Weight: 321 lb (145.605 kg)    PHYSICAL EXAM General: Well developed, well nourished, male in no acute distress. Head: Normocephalic, atraumatic.  Neck: Supple without bruits, JVD not elevated. Lungs:  Resp regular and unlabored, CTA. Heart: RRR, S1, S2, no S3, S4, 2/6 murmur; no rub. Abdomen: Soft, non-tender, non-distended, BS + x 4.  Extremities: No clubbing, cyanosis, 1+ edema.  Neuro: Alert and oriented X 3. Moves all extremities spontaneously. Psych: Normal affect.  LABS: CBC:  Recent Labs  01/15/13 1109  WBC 4.5  NEUTROABS 2.6  HGB 13.1  HCT 39.4  MCV 69.7*  PLT 269   INR:No results found for this basename: INR,  in the last 72 hours Basic Metabolic Panel:  Recent Labs  01/15/13 1109  NA 139  K 3.0*  CL 97  CO2 33*  GLUCOSE 132*  BUN 12  CREATININE 1.03  CALCIUM 9.2   Cardiac Enzymes:  Recent Labs  01/15/13 1715 01/15/13 2345  TROPONINI <0.30 <0.30    Recent Labs  01/15/13 1129  TROPIPOC 0.04    TELE:   SR, S brady, NSVT, accel junct rhythm     ECG: 16-Jan-2013 04:41:45  Unusual P axis, possible ectopic atrial rhythm Minimal voltage criteria for LVH, may be normal variant T wave abnormality, consider lateral ischemia Prolonged QT Vent. rate 63 BPM PR interval 162 ms QRS  duration 102 ms QT/QTc 466/476 ms P-R-T axes 262 7 111  Radiology/Studies: Dg Chest 2 View 01/15/2013   CLINICAL DATA:  Chest pain.  EXAM: CHEST  2 VIEW  COMPARISON:  05/29/2012  FINDINGS: The mediastinal contours are within normal limits. Mild stable cardiomegaly. Both lungs are clear. The visualized skeletal structures are unremarkable.  IMPRESSION: No active cardiopulmonary disease.   Electronically Signed   By: Marin Olp M.D.   On: 01/15/2013 12:10     Current Medications:  . amLODipine  10 mg Oral Daily  . aspirin  324 mg Oral NOW   Or  . aspirin  300 mg Rectal NOW  . aspirin EC  81 mg Oral Daily  . cloNIDine  0.2 mg Oral BID  . enoxaparin (LOVENOX) injection  40 mg Subcutaneous Q24H  . hydrochlorothiazide  25 mg Oral Daily  . irbesartan  300 mg Oral Daily  . simvastatin  20 mg Oral q1800      ASSESSMENT AND PLAN: Principal Problem:   Intermediate coronary syndrome - pain-free on medical therapy, ECG not acute. However, symptoms are progressive and increasing in intensity. Cardiac cath indicated. The risks and benefits of a cardiac catheterization including, but not limited to, death,  stroke, MI, kidney damage and bleeding were discussed with the patient who indicates understanding and agrees to proceed.   HTN - will leave management to MD, consider adding hydralazine or increasing clonidine since he is already on high doses of ARB and amlodipine.   Arrhythmia - NSVT, but also has accel junct and some sinus brady. MD advise on BB.   LE edema - Chronic, continue HCTZ after cath.   Signed, Rosaria Ferries , PA-C 9:14 AM 01/16/2013 Patient seen and examined and history reviewed. Agree with above findings and plan. Patient denies recurrent chest pain since admission. Yesterday was the worst pain that he has had. Monitor reveals runs of idioventricular rhythm. BP control has been a chronic problem. Given recurrent chest pain and convincing history I have recommended proceeding  with cardiac cath today. The procedure and risks were reviewed including but not limited to death, myocardial infarction, stroke, arrythmias, bleeding, transfusion, emergency surgery, dye allergy, or renal dysfunction. The patient voices understanding and is agreeable to proceed.  BP has improved. Will need to address regimen depending on cath results.  [name]  01/16/2013 9:34 AM

## 2013-01-17 ENCOUNTER — Encounter (HOSPITAL_COMMUNITY): Payer: Self-pay | Admitting: Physician Assistant

## 2013-01-17 DIAGNOSIS — I10 Essential (primary) hypertension: Secondary | ICD-10-CM

## 2013-01-17 DIAGNOSIS — I472 Ventricular tachycardia: Secondary | ICD-10-CM

## 2013-01-17 DIAGNOSIS — R001 Bradycardia, unspecified: Secondary | ICD-10-CM

## 2013-01-17 DIAGNOSIS — I5189 Other ill-defined heart diseases: Secondary | ICD-10-CM

## 2013-01-17 DIAGNOSIS — I4729 Other ventricular tachycardia: Secondary | ICD-10-CM

## 2013-01-17 DIAGNOSIS — R079 Chest pain, unspecified: Secondary | ICD-10-CM

## 2013-01-17 DIAGNOSIS — I498 Other specified cardiac arrhythmias: Secondary | ICD-10-CM

## 2013-01-17 MED ORDER — POTASSIUM CHLORIDE CRYS ER 20 MEQ PO TBCR: 40.0000 meq | EXTENDED_RELEASE_TABLET | Freq: Every day | ORAL | Status: DC

## 2013-01-17 MED ORDER — HYDRALAZINE HCL 25 MG PO TABS: 25.0000 mg | ORAL_TABLET | Freq: Three times a day (TID) | ORAL | Status: DC

## 2013-01-17 MED ORDER — FUROSEMIDE 20 MG PO TABS: 20.0000 mg | ORAL_TABLET | Freq: Every day | ORAL | Status: DC

## 2013-01-17 NOTE — Progress Notes (Signed)
Reviewed discharge instructions with patient and he stated his understanding.  Patient discharged home with family.  [name]

## 2013-01-17 NOTE — Discharge Summary (Signed)
Patient seen and examined and history reviewed. Agree with above findings and plan. See my earlier rounding note.  Nathan Grant 01/17/2013 1:53 PM

## 2013-01-17 NOTE — Discharge Summary (Signed)
Discharge Summary   Patient ID: Nathan Grant MRN: [id number], DOB/AGE: Aug 15, 1945 67 y.o. Admit date: 01/15/2013 D/C date:     01/17/2013  Primary Cardiologist: Nathan Grant  Primary Discharge Diagnoses:  1. Chest pain due to hypertensive heart disease - cardiac cath 01/16/13 with normal coronaries and normal LV function 2. HTN 3. NSVT 4. Accelerated junctional rhythm 5. Sinus bradycardia 6. Morbid obesity BMI 42.36 7. Hypokalemia 8. Diastolic dysfunction  Secondary Discharge Diagnoses:  1. History of atrial flutter s/p cardioversion 06/2010 2. Osteoarthritis 3. DJD 4. Microcytic anemia 2002 5. Tobacco chew use 6. H/o Pseudomonas urinary tract infection 7. Erectile dysfunction 8. Tendonitis  Hospital Course: Nathan Grant is a 67 y.o.male with history of prior atrial flutter, HTN, tobacco abuse, and HL who presented to [facility] on 01/15/13 with chest pain. He had a recent admission in Feb 2014 with exertional CP in which he ruled out. Myoview had showed no evidence of ischemia, echo showed normal LVEF 65-70% with no wall motion abnormatlities. Since February 2014, he has continued to have exertional chest pain. He reported getting chest tightness at approximately 2 blocks. The symptoms have been occuring more frequently but no recent change in severity or level of exertion for onset. While at church on day of admission, he developed his first episode of resting CP, 10/10, sharp mid-chest with associated SOB, palpitations but no n/v, diaphoresis. It lasted approx 30 minutes and resolved on its own but he proceeded to the ER. EKG showed shows NSR, frequent atrial ectopy, occasional PVCs, no specific ischemic changes. Initial troponin was negative and remained negative. BP was elevated up to 209/111 on admission. CXR was nonacute. Given his history, cardiac cath was recommended. This demonstrated normal coronary anatomy with normal LV function. Medical therapy with attention to BP  control was recommended. During his admission he was observed as having arrhythmias of brief NSVT, accelerated junctional rhythm, and some sinus bradycardia. Because of this bradycardia, he is not on any beta blockers. Potassium was added to his regimen given hypokalemia. His CP was felt secondary to hypertensive heart disease. Hydralazine was added to his medicine regimen. He had some edema that was felt due to diastolic dysfunction and will take lasix daily from now on. The patient was instructed to monitor their blood pressure at home and to call if tending to run high. He feels "great" today without further symptoms. Nathan Grant has seen and examined the patient today and feels he is stable for discharge.   Discharge Vitals: Blood pressure 159/86, pulse 60, temperature 97.6 F (36.4 C), temperature source Oral, resp. rate 18, height 6\' 1"  (1.854 m), weight 321 lb (145.605 kg), SpO2 97.00%.  Labs: Lab Results  Component Value Date   WBC 4.5 01/15/2013   HGB 13.1 01/15/2013   HCT 39.4 01/15/2013   MCV 69.7* 01/15/2013   PLT 269 01/15/2013     Recent Labs Lab 01/16/13 1158  NA 139  K 4.4  CL 100  CO2 29  BUN 17  CREATININE 1.14  CALCIUM 8.9  GLUCOSE 110*    Recent Labs  01/15/13 1715 01/15/13 2345  TROPONINI <0.30 <0.30   Lab Results  Component Value Date   CHOL 142 05/30/2012   HDL 54 05/30/2012   LDLCALC 74 05/30/2012   TRIG 70 05/30/2012    Diagnostic Studies/Procedures   Cardiac catheterization this admission, please see full report and above for summary.  Dg Chest 2 View  01/15/2013   CLINICAL DATA:  Chest pain.  EXAM: CHEST  2 VIEW  COMPARISON:  05/29/2012  FINDINGS: The mediastinal contours are within normal limits. Mild stable cardiomegaly. Both lungs are clear. The visualized skeletal structures are unremarkable.  IMPRESSION: No active cardiopulmonary disease.   Electronically Signed   By: Nathan Grant M.D.   On: 01/15/2013 12:10    Discharge Medications       Medication List    STOP taking these medications       losartan-hydrochlorothiazide 100-25 MG per tablet  Commonly known as:  HYZAAR      TAKE these medications       aspirin 81 MG tablet  Take 1 tablet (81 mg total) by mouth daily.     cloNIDine 0.2 MG tablet  Commonly known as:  CATAPRES  Take 0.2 mg by mouth 2 (two) times daily.     furosemide 20 MG tablet  Commonly known as:  LASIX  Take 1 tablet (20 mg total) by mouth daily.     hydrALAZINE 25 MG tablet  Commonly known as:  APRESOLINE  Take 1 tablet (25 mg total) by mouth every 8 (eight) hours.     potassium chloride SA 20 MEQ tablet  Commonly known as:  K-DUR,KLOR-CON  Take 2 tablets (40 mEq total) by mouth daily.  Start taking on:  01/18/2013     pravastatin 20 MG tablet  Commonly known as:  PRAVACHOL  Take 1 tablet (20 mg total) by mouth daily.     TRIBENZOR 40-10-25 MG Tabs  Generic drug:  Olmesartan-Amlodipine-HCTZ  Take 1 tablet by mouth at bedtime.        Disposition   The patient will be discharged in stable condition to home. Discharge Orders   Future Appointments Provider Department Dept Phone   01/31/2013 9:30 AM Nathan Junes, NP [place] Office [phone]   Future Orders Complete By Expires   Diet - low sodium heart healthy  As directed    Discharge instructions  As directed    Comments:     Please monitor your blood pressure occasionally at home, the grocery store, the pharmacy, etc. Call your doctor if you tend to get readings of greater than 130 on the top number or 80 on the bottom number.   Increase activity slowly  As directed    Comments:     No driving for 2 days. No lifting over 5 lbs for 1 week. No sexual activity for 1 week. You may return to work tomorrow if you are feeling well, with the above restrictions. Keep procedure site clean & dry. If you notice increased pain, swelling, bleeding or pus, call/return!  You may shower, but no soaking baths/hot tubs/pools  for 1 week.     Follow-up Information   Follow up with Nathan Merle, NP. ([place] - 01/31/13 at 9:30am)    Specialty:  Nurse Practitioner   Contact information:   [place] [place] [address] [phone]         Duration of Discharge Encounter: Greater than 30 minutes including physician and PA time.  Signed, Melina Copa PA-C 01/17/2013, 10:57 AM

## 2013-01-17 NOTE — Progress Notes (Signed)
Patient Name: Nathan Grant Date of Encounter: 01/17/2013  Principal Problem:   Intermediate coronary syndrome    SUBJECTIVE: No chest pain. Feels very well today.  OBJECTIVE Filed Vitals:   01/16/13 1830 01/16/13 1900 01/16/13 2056 01/17/13 0625  BP: 150/88 151/78 146/60 165/90  Pulse: 74  76 60  Temp:   98.1 F (36.7 C) 97.6 F (36.4 C)  TempSrc:    Oral  Resp:   18 18  Height:      Weight:      SpO2: 100% 100% 97% 97%    Intake/Output Summary (Last 24 hours) at 01/17/13 0956 Last data filed at 01/17/13 0030  Gross per 24 hour  Intake   1110 ml  Output      0 ml  Net   1110 ml   Filed Weights   01/15/13 1029  Weight: 321 lb (145.605 kg)    PHYSICAL EXAM General: Well developed, well nourished, male in no acute distress. Head: Normocephalic, atraumatic.  Neck: Supple without bruits, JVD not elevated. Lungs:  Resp regular and unlabored, CTA. Heart: RRR, S1, S2, no S3, S4, 2/6 murmur; no rub. Abdomen: Soft, non-tender, non-distended, BS + x 4.  Extremities: No clubbing, cyanosis, 1+ edema. No radial hematoma or tenderness. Neuro: Alert and oriented X 3. Moves all extremities spontaneously. Psych: Normal affect.  LABS: CBC:  Recent Labs  01/15/13 1109  WBC 4.5  NEUTROABS 2.6  HGB 13.1  HCT 39.4  MCV 69.7*  PLT 269   INR:  Recent Labs  01/16/13 1158  INR 0000000   Basic Metabolic Panel:  Recent Labs  01/15/13 1109 01/16/13 1158  NA 139 139  K 3.0* 4.4  CL 97 100  CO2 33* 29  GLUCOSE 132* 110*  BUN 12 17  CREATININE 1.03 1.14  CALCIUM 9.2 8.9   Cardiac Enzymes:  Recent Labs  01/15/13 1715 01/15/13 2345  TROPONINI <0.30 <0.30    Recent Labs  01/15/13 1129  TROPIPOC 0.04    TELE:   SR, S brady, NSVT, accel junct rhythm     ECG: 16-Jan-2013 04:41:45  Unusual P axis, possible ectopic atrial rhythm Minimal voltage criteria for LVH, may be normal variant T wave abnormality, consider lateral ischemia Prolonged  QT Vent. rate 63 BPM PR interval 162 ms QRS duration 102 ms QT/QTc 466/476 ms P-R-T axes 262 7 111  Radiology/Studies: Dg Chest 2 View 01/15/2013   CLINICAL DATA:  Chest pain.  EXAM: CHEST  2 VIEW  COMPARISON:  05/29/2012  FINDINGS: The mediastinal contours are within normal limits. Mild stable cardiomegaly. Both lungs are clear. The visualized skeletal structures are unremarkable.  IMPRESSION: No active cardiopulmonary disease.   Electronically Signed   By: Marin Olp M.D.   On: 01/15/2013 12:10     Current Medications:  . amLODipine  10 mg Oral Daily  . aspirin EC  81 mg Oral Daily  . cloNIDine  0.2 mg Oral BID  . hydrALAZINE  25 mg Oral Q8H  . hydrochlorothiazide  25 mg Oral Daily  . irbesartan  300 mg Oral Daily  . simvastatin  20 mg Oral q1800      ASSESSMENT AND PLAN: Principal Problem:   Chest pain - Cardiac cath shows normal coronary arteries and vigorous LV function. I think his chest pain is due to hypertensive heart disease. Will focus on BP control. Plan on DC today with follow up in approx. 2 weeks.  HTN - Will continue Tribenzor 40/10/25 mg,  clonidine 0.2 mg bid, lasix 20 mg daily, potassium. Will add hydralazine 25 mg bid. Patient reports compliance with low sodium diet.   Arrhythmia - NSVT, but also has accel junct and some sinus brady.   LE edema - Chronic, continue HCTZ after cath.   Nathan Grant [facility]  01/17/2013 9:56 AM

## 2013-01-31 ENCOUNTER — Ambulatory Visit (INDEPENDENT_AMBULATORY_CARE_PROVIDER_SITE_OTHER): Payer: Medicare Other | Admitting: Nurse Practitioner

## 2013-01-31 ENCOUNTER — Encounter: Payer: Self-pay | Admitting: Nurse Practitioner

## 2013-01-31 VITALS — BP 140/80 | HR 53 | Ht 73.0 in | Wt 332.0 lb

## 2013-01-31 DIAGNOSIS — I119 Hypertensive heart disease without heart failure: Secondary | ICD-10-CM

## 2013-01-31 MED ORDER — HYDRALAZINE HCL 50 MG PO TABS: 50.0000 mg | ORAL_TABLET | Freq: Three times a day (TID) | ORAL | Status: DC

## 2013-01-31 MED ORDER — CLONIDINE HCL 0.2 MG PO TABS: 0.2000 mg | ORAL_TABLET | Freq: Every day | ORAL | Status: DC

## 2013-01-31 NOTE — Patient Instructions (Signed)
Stay on your current medicines but I am cutting back the Clonidine to just take at night.  Increase the Hydralazine to 50 mg three times a day - you can take 2 of the 25 mg tabs 3 x a day to use those up - the prescription for the 50 mg tab is at the drug store  I will see you in a month.  Call the [place] office at [phone] if you have any questions, problems or concerns.

## 2013-01-31 NOTE — Progress Notes (Signed)
Reegan Moll Date of Birth: Oct 24, 1945 Medical Record [id number]  History of Present Illness: Nathan Grant is seen back today for a post hospital visit. Seen for Dr. Martinique. He has a history of past AF/flutter, HTN, tobacco abuse and HLD. Has had recent Myoview in February of 2014 due to chest pain which showed no ischemia and a normal EF.   Most recently back to the hospital with continued bouts of chest pain. BP was quite elevated at 209/111. He was cathed - this was normal. Did have brief NSVT, accelerated junctional rhythm and bradycardia - not on beta blockers. His CP was felt to be due to hypertensive heart disease.  Hydralazine was added.   Comes back today. Here alone. Doing ok. No more chest pain. Not short of breath. Swelling has improved - but not resolved. No BP cuff at home. Says he is avoiding salt. Has lots of dry mouth and is falling asleep way too easily - he assumes this is from the medicine.   Current Outpatient Prescriptions  Medication Sig Dispense Refill  . APPLE CIDER VINEGAR PO Take by mouth 2 (two) times daily.      [name] aspirin 81 MG tablet Take 1 tablet (81 mg total) by mouth daily.  30 tablet  0  . cloNIDine (CATAPRES) 0.2 MG tablet Take 0.2 mg by mouth 2 (two) times daily.      . furosemide (LASIX) 20 MG tablet Take 1 tablet (20 mg total) by mouth daily.  30 tablet  3  . hydrALAZINE (APRESOLINE) 25 MG tablet Take 1 tablet (25 mg total) by mouth every 8 (eight) hours.  90 tablet  3  . Olmesartan-Amlodipine-HCTZ (TRIBENZOR) 40-10-25 MG TABS Take 1 tablet by mouth at bedtime.      . potassium chloride SA (K-DUR,KLOR-CON) 20 MEQ tablet Take 2 tablets (40 mEq total) by mouth daily.  60 tablet  3  . pravastatin (PRAVACHOL) 20 MG tablet Take 1 tablet (20 mg total) by mouth daily.  30 tablet  6   No current facility-administered medications for this visit.    No Known Allergies  Past Medical History  Diagnosis Date  . Atrial flutter     s/p cardioversion 06/2010  .  HTN (hypertension)   . Morbid obesity   . Osteoarthritis   . Degenerative joint disease      End-stage degenerative joint disease, left knee  . Tobacco chew use      History of chewing tobacco usage.   . Urinary tract infection      Pseudomonas urinary tract infection  . Obesity   . Microcytic anemia  03/22/2001  . Chronic headaches   . Erectile dysfunction   . Tendonitis   . Hypertensive heart disease     a. Admit for CP 01/2013 felt due to this. Cath with normal coronary anatomy.  . NSVT (nonsustained ventricular tachycardia)     a. During 01/2013 adm.  . Bradycardia     a. Accelerated junctional & sinus bradycardia during 01/2013 adm.    Past Surgical History  Procedure Laterality Date  . Total knee arthroplasty  May 2009    Bilateral  . Cardioversion  07/01/2010  . Vasectomy      History  Smoking status  . Never Smoker   Smokeless tobacco  . Current User  . Types: Chew    History  Alcohol Use No    Family History  Problem Relation Age of Onset  . Alcohol abuse Brother   . Hypertension Brother   .  Hypertension Mother     Review of Systems: The review of systems is per the HPI.  All other systems were reviewed and are negative.  Physical Exam: BP 140/80  Pulse 53  Ht 6\' 1"  (1.854 m)  Wt 332 lb (150.594 kg)  BMI 43.81 kg/m2  SpO2 97% Patient is very pleasant and in no acute distress. Remains obese and of large stature. Skin is warm and dry. Color is normal.  HEENT is unremarkable. Normocephalic/atraumatic. PERRL. Sclera are nonicteric. Neck is supple. No masses. No JVD. Lungs are clear. Cardiac exam shows a regular rate and rhythm. Abdomen is soft. Extremities are without edema. Gait and ROM are intact. No gross neurologic deficits noted.  LABORATORY DATA: Lab Results  Component Value Date   WBC 4.5 01/15/2013   HGB 13.1 01/15/2013   HCT 39.4 01/15/2013   PLT 269 01/15/2013   GLUCOSE 110* 01/16/2013   CHOL 142 05/30/2012   TRIG 70 05/30/2012   HDL 54  05/30/2012   LDLCALC 74 05/30/2012   ALT 20 05/29/2012   AST 30 05/29/2012   NA 139 01/16/2013   K 4.4 01/16/2013   CL 100 01/16/2013   CREATININE 1.14 01/16/2013   BUN 17 01/16/2013   CO2 29 01/16/2013   INR 1.11 01/16/2013   Coronary angiography:  Coronary dominance: right  Left mainstem: Normal  Left anterior descending (LAD): Very large, normal.  Left circumflex (LCx): large, 3 OM branches, normal.  Right coronary artery (RCA): Very large, normal.  Left ventriculography: Left ventricular systolic function is normal, LVEF is estimated at 60-65%, there is no significant mitral regurgitation  Final Conclusions:  1. Normal coronary anatomy.  2. Normal LV function.  Recommendations: Medical therapy with focus on BP control. Will add hydralazine to his prior meds.  [name]  01/16/2013, 5:01 PM   Echo Study Conclusions from February 2014  - Left ventricle: Wall thickness was increased in a pattern of severe LVH. There is a suggestive of assymetric apical hypertrphy seen in some views. Systolic function was vigorous. The estimated ejection fraction was in the range of 65% to 70%. Doppler parameters are consistent with abnormal left ventricular relaxation (grade 1 diastolic dysfunction). - Left atrium: The atrium was moderately dilated.   Assessment / Plan: 1. Chest pain - normal cath   2. HTN - BP has improved. He is having lots of side effects - probably from the clonidine - will cut it back to just 0.2 at night - increase the hydralazine to 50 mg TID - I will see him in a month.   3. HLD  4. Diastolic dysfunction - managed medically. I have left him on his daily lasix - needs to really restrict salt.    Patient is agreeable to this plan and will call if any problems develop in the interim.   Burtis Junes, RN, [facility] [address] [place], [place]  [id number]

## 2013-02-28 ENCOUNTER — Ambulatory Visit: Payer: Medicare Other | Admitting: Nurse Practitioner

## 2013-04-07 ENCOUNTER — Encounter: Payer: Self-pay | Admitting: Nurse Practitioner

## 2013-05-02 ENCOUNTER — Telehealth: Payer: Self-pay | Admitting: *Deleted

## 2013-05-02 ENCOUNTER — Encounter: Payer: Self-pay | Admitting: Nurse Practitioner

## 2013-05-02 ENCOUNTER — Ambulatory Visit (INDEPENDENT_AMBULATORY_CARE_PROVIDER_SITE_OTHER): Payer: Medicare Other | Admitting: Nurse Practitioner

## 2013-05-02 ENCOUNTER — Encounter (INDEPENDENT_AMBULATORY_CARE_PROVIDER_SITE_OTHER): Payer: Self-pay

## 2013-05-02 VITALS — BP 180/90 | HR 56 | Ht 73.0 in | Wt 317.4 lb

## 2013-05-02 DIAGNOSIS — I119 Hypertensive heart disease without heart failure: Secondary | ICD-10-CM

## 2013-05-02 LAB — BASIC METABOLIC PANEL
BUN: 12 mg/dL (ref 6–23)
CO2: 29 mEq/L (ref 19–32)
Calcium: 8.7 mg/dL (ref 8.4–10.5)
Chloride: 104 mEq/L (ref 96–112)
Creatinine, Ser: 1.2 mg/dL (ref 0.4–1.5)
GFR: 67.04 mL/min (ref >60.00)
Glucose, Bld: 100 mg/dL — ABNORMAL HIGH (ref 70–99)
Potassium: 3.6 mEq/L (ref 3.5–5.1)
Sodium: 140 mEq/L (ref 135–145)

## 2013-05-02 MED ORDER — HYDRALAZINE HCL 100 MG PO TABS: 100.0000 mg | ORAL_TABLET | Freq: Two times a day (BID) | ORAL | Status: DC

## 2013-05-02 NOTE — Telephone Encounter (Signed)
S/w Patrice from Dr. Coletta Memos office to verify pt's medication list. Martin Majestic over medication changes with Kathrene Alu

## 2013-05-02 NOTE — Progress Notes (Signed)
[name] Date of Birth: 03/06/1946 Medical Record [id number]  History of Present Illness: Nathan Grant is seen back today for a follow up visit. Seen for Dr. Martinique. He has a history of past AF/flutter, HTN, tobacco abuse and HLD. Has had recent Myoview in February of 2014 due to chest pain which showed no ischemia and a normal EF. Was hospitalized last fall with chest pain - was cathed - this was normal. Has had noted brief NSVT, accelerated junctional rhythm and bradycardia - not on beta blockers. Has severe LVH on echo.   Seen for his post hospital visit back in October - was to have followed up a month later. Was having lots of side effects from Clonidine and I cut this back and increased his Hydralazine.  Comes back today. Here alone. Not able to tell me his medicines. Says he has had new medicines added by PCP. We have called - now on 0.3 of Clonidine BID - but he is only taking at night due to side effects. Now on Aldactone - no longer on his potassium. Has lost weight and feels good. No chest pain. Not short of breath. No swelling. He has had his medicines today.   Current Outpatient Prescriptions  Medication Sig Dispense Refill  . APPLE CIDER VINEGAR PO Take by mouth 2 (two) times daily.      [name] aspirin 81 MG tablet Take 1 tablet (81 mg total) by mouth daily.  30 tablet  0  . BEE POLLEN PO Take by mouth daily.      . cloNIDine (CATAPRES) 0.2 MG tablet Take 1 tablet (0.2 mg total) by mouth at bedtime.      . hydrALAZINE (APRESOLINE) 50 MG tablet Take 1 tablet (50 mg total) by mouth 3 (three) times daily.  90 tablet  3  . Olmesartan-Amlodipine-HCTZ (TRIBENZOR) 40-10-25 MG TABS Take 1 tablet by mouth at bedtime.      . potassium chloride SA (K-DUR,KLOR-CON) 20 MEQ tablet Take 2 tablets (40 mEq total) by mouth daily.  60 tablet  3  . pravastatin (PRAVACHOL) 20 MG tablet Take 1 tablet (20 mg total) by mouth daily.  30 tablet  6   No current facility-administered medications for this  visit.    No Known Allergies  Past Medical History  Diagnosis Date  . Atrial flutter     s/p cardioversion 06/2010  . HTN (hypertension)   . Morbid obesity   . Osteoarthritis   . Degenerative joint disease      End-stage degenerative joint disease, left knee  . Tobacco chew use      History of chewing tobacco usage.   . Urinary tract infection      Pseudomonas urinary tract infection  . Obesity   . Microcytic anemia  03/22/2001  . Chronic headaches   . Erectile dysfunction   . Tendonitis   . Hypertensive heart disease     a. Admit for CP 01/2013 felt due to this. Cath with normal coronary anatomy.  . NSVT (nonsustained ventricular tachycardia)     a. During 01/2013 adm.  . Bradycardia     a. Accelerated junctional & sinus bradycardia during 01/2013 adm.    Past Surgical History  Procedure Laterality Date  . Total knee arthroplasty  May 2009    Bilateral  . Cardioversion  07/01/2010  . Vasectomy      History  Smoking status  . Never Smoker   Smokeless tobacco  . Current User  . Types: Chew  History  Alcohol Use No    Family History  Problem Relation Age of Onset  . Alcohol abuse Brother   . Hypertension Brother   . Hypertension Mother     Review of Systems: The review of systems is per the HPI.  All other systems were reviewed and are negative.  Physical Exam: BP 180/90  Pulse 56  Ht 6\' 1"  (1.854 m)  Wt 317 lb 6.4 oz (143.972 kg)  BMI 41.89 kg/m2 BP by me is 160/100.  Patient is very pleasant and in no acute distress. Weight is down but he remains obese. Skin is warm and dry. Color is normal.  HEENT is unremarkable. Normocephalic/atraumatic. PERRL. Sclera are nonicteric. Neck is supple. No masses. No JVD. Lungs are clear. Cardiac exam shows a regular rate and rhythm. Abdomen is soft. Extremities are without edema. Gait and ROM are intact. No gross neurologic deficits noted.  Wt Readings from Last 3 Encounters:  05/02/13 317 lb 6.4 oz (143.972 kg)    01/31/13 332 lb (150.594 kg)  01/15/13 321 lb (145.605 kg)     LABORATORY DATA: BMET is pending   Lab Results  Component Value Date   WBC 4.5 01/15/2013   HGB 13.1 01/15/2013   HCT 39.4 01/15/2013   PLT 269 01/15/2013   GLUCOSE 110* 01/16/2013   CHOL 142 05/30/2012   TRIG 70 05/30/2012   HDL 54 05/30/2012   LDLCALC 74 05/30/2012   ALT 20 05/29/2012   AST 30 05/29/2012   NA 139 01/16/2013   K 4.4 01/16/2013   CL 100 01/16/2013   CREATININE 1.14 01/16/2013   BUN 17 01/16/2013   CO2 29 01/16/2013   INR 1.11 01/16/2013     Assessment / Plan: 1. Hypertensive heart disease with severe LVH - will increase the Hydralazine to 100 mg BID - he will not take TID. Check BMET today. He is seeing PCP in about 3 weeks - we will see him back in about 6 weeks.   2. Diastolic dysfunction - looks compensated.  3. Obesity - actively losing weight - his goal is to be below 300 by his birthday in [birth year].   Patient is agreeable to this plan and will call if any problems develop in the interim.   Burtis Junes, RN, [facility] [address] [place], [place]  [address] [phone]

## 2013-05-02 NOTE — Patient Instructions (Addendum)
Stay on your current medicines but I am increasing the Hydralazine 100 mg two times a day  We will check lab today  I would like to see you in 6 weeks  Try to stay away from the salt  Keep up the good work on losing weight!!!!  Call the [place] office at [phone] if you have any questions, problems or concerns.

## 2013-05-23 ENCOUNTER — Emergency Department (HOSPITAL_COMMUNITY): Payer: Medicare Other

## 2013-05-23 ENCOUNTER — Emergency Department (HOSPITAL_COMMUNITY)
Admission: EM | Admit: 2013-05-23 | Discharge: 2013-05-23 | Disposition: A | Discharge status: Home / Self Care | Payer: Medicare Other | Attending: Emergency Medicine | Admitting: Emergency Medicine

## 2013-05-23 ENCOUNTER — Encounter (HOSPITAL_COMMUNITY): Payer: Self-pay | Admitting: Emergency Medicine

## 2013-05-23 DIAGNOSIS — F17200 Nicotine dependence, unspecified, uncomplicated: Secondary | ICD-10-CM | POA: Insufficient documentation

## 2013-05-23 DIAGNOSIS — R079 Chest pain, unspecified: Secondary | ICD-10-CM

## 2013-05-23 DIAGNOSIS — IMO0002 Reserved for concepts with insufficient information to code with codable children: Secondary | ICD-10-CM | POA: Insufficient documentation

## 2013-05-23 DIAGNOSIS — Z8739 Personal history of other diseases of the musculoskeletal system and connective tissue: Secondary | ICD-10-CM | POA: Insufficient documentation

## 2013-05-23 DIAGNOSIS — G8929 Other chronic pain: Secondary | ICD-10-CM | POA: Insufficient documentation

## 2013-05-23 DIAGNOSIS — Z862 Personal history of diseases of the blood and blood-forming organs and certain disorders involving the immune mechanism: Secondary | ICD-10-CM | POA: Insufficient documentation

## 2013-05-23 DIAGNOSIS — N529 Male erectile dysfunction, unspecified: Secondary | ICD-10-CM | POA: Insufficient documentation

## 2013-05-23 DIAGNOSIS — Z87442 Personal history of urinary calculi: Secondary | ICD-10-CM | POA: Insufficient documentation

## 2013-05-23 DIAGNOSIS — Z79899 Other long term (current) drug therapy: Secondary | ICD-10-CM | POA: Insufficient documentation

## 2013-05-23 DIAGNOSIS — I169 Hypertensive crisis, unspecified: Secondary | ICD-10-CM

## 2013-05-23 DIAGNOSIS — I4892 Unspecified atrial flutter: Secondary | ICD-10-CM | POA: Insufficient documentation

## 2013-05-23 DIAGNOSIS — I119 Hypertensive heart disease without heart failure: Secondary | ICD-10-CM | POA: Insufficient documentation

## 2013-05-23 DIAGNOSIS — Z7982 Long term (current) use of aspirin: Secondary | ICD-10-CM | POA: Insufficient documentation

## 2013-05-23 DIAGNOSIS — R0789 Other chest pain: Secondary | ICD-10-CM | POA: Insufficient documentation

## 2013-05-23 LAB — POCT I-STAT TROPONIN I: TROPONIN I, POC: 0.09 ng/mL — AB (ref 0.00–0.08)

## 2013-05-23 LAB — TROPONIN I: Troponin I: <0.3 ng/mL (ref <0.30)

## 2013-05-23 LAB — BASIC METABOLIC PANEL
BUN: 12 mg/dL (ref 6–23)
CALCIUM: 8.7 mg/dL (ref 8.4–10.5)
CO2: 27 mEq/L (ref 19–32)
Chloride: 98 mEq/L (ref 96–112)
Creatinine, Ser: 1 mg/dL (ref 0.50–1.35)
GFR calc Af Amer: 87 mL/min — ABNORMAL LOW (ref >90)
GFR calc non Af Amer: 75 mL/min — ABNORMAL LOW (ref >90)
GLUCOSE: 123 mg/dL — AB (ref 70–99)
Potassium: 3.7 mEq/L (ref 3.7–5.3)
Sodium: 147 mEq/L (ref 137–147)

## 2013-05-23 LAB — CBC
HEMATOCRIT: 33.9 % — AB (ref 39.0–52.0)
HEMOGLOBIN: 11.1 g/dL — AB (ref 13.0–17.0)
MCH: 23.1 pg — AB (ref 26.0–34.0)
MCHC: 32.7 g/dL (ref 30.0–36.0)
MCV: 70.6 fL — AB (ref 78.0–100.0)
Platelets: 254 10*3/uL (ref 150–400)
RBC: 4.8 MIL/uL (ref 4.22–5.81)
RDW: 15.5 % (ref 11.5–15.5)
WBC: 4.8 10*3/uL (ref 4.0–10.5)

## 2013-05-23 LAB — PRO B NATRIURETIC PEPTIDE: Pro B Natriuretic peptide (BNP): 1707 pg/mL — ABNORMAL HIGH (ref 0–125)

## 2013-05-23 MED ORDER — CLONIDINE HCL 0.2 MG PO TABS
0.2000 mg | ORAL_TABLET | Freq: Once | ORAL | Status: DC
  Filled 2013-05-23: qty 1

## 2013-05-23 MED ORDER — LABETALOL HCL 5 MG/ML IV SOLN
10.0000 mg | INTRAVENOUS | Status: DC | PRN
  Administered 2013-05-23 (×2): 10 mg via INTRAVENOUS
  Filled 2013-05-23 (×2): qty 4

## 2013-05-23 MED ORDER — HYDROCHLOROTHIAZIDE 25 MG PO TABS: 25.0000 mg | ORAL_TABLET | Freq: Every day | ORAL | Status: DC

## 2013-05-23 MED ORDER — OLMESARTAN 10 MG HALF TABLET: 10.0000 mg | ORAL_TABLET | Freq: Every day | ORAL | Status: DC

## 2013-05-23 MED ORDER — AMLODIPINE BESYLATE 10 MG PO TABS: 10.0000 mg | ORAL_TABLET | Freq: Every day | ORAL | Status: DC

## 2013-05-23 MED ORDER — ASPIRIN 81 MG PO CHEW
243.0000 mg | CHEWABLE_TABLET | Freq: Once | ORAL | Status: AC
  Administered 2013-05-23: 243 mg via ORAL
  Filled 2013-05-23: qty 3

## 2013-05-23 NOTE — ED Notes (Signed)
Reported to Dr. Vanita Panda pt.s elevated troponin

## 2013-05-23 NOTE — ED Provider Notes (Signed)
CSN: [id number]     Arrival date & time 05/23/13  1204 History   First MD Initiated Contact with Patient 05/23/13 1225     Chief Complaint  Patient presents with  . Chest Pain     HPI  Patient presents after an episode of chest pain that resolved. Patient notes that after a stressful interaction with family earlier today he had chest tightness. The tetanus was about the superior sternum, nonradiating, there is mild associated nausea.  Symptoms improved with rest. Total duration was less than 10 minutes. On my exam he has no ongoing complaints. Patient states that he has no history of MRI, has had catheterization within the past 2 years. He states that he has difficulty obtaining some of his medication secondary to monetary concerns. He states that he was in his usual state of health prior to the onset of symptoms.   Past Medical History  Diagnosis Date  . Atrial flutter     s/p cardioversion 06/2010  . HTN (hypertension)   . Morbid obesity   . Osteoarthritis   . Degenerative joint disease      End-stage degenerative joint disease, left knee  . Tobacco chew use      History of chewing tobacco usage.   . Urinary tract infection      Pseudomonas urinary tract infection  . Obesity   . Microcytic anemia  03/22/2001  . Chronic headaches   . Erectile dysfunction   . Tendonitis   . Hypertensive heart disease     a. Admit for CP 01/2013 felt due to this. Cath with normal coronary anatomy.  . NSVT (nonsustained ventricular tachycardia)     a. During 01/2013 adm.  . Bradycardia     a. Accelerated junctional & sinus bradycardia during 01/2013 adm.   Past Surgical History  Procedure Laterality Date  . Total knee arthroplasty  May 2009    Bilateral  . Cardioversion  07/01/2010  . Vasectomy     Family History  Problem Relation Age of Onset  . Alcohol abuse Brother   . Hypertension Brother   . Hypertension Mother    History  Substance Use Topics  . Smoking status: Never  Smoker   . Smokeless tobacco: Current User    Types: Chew  . Alcohol Use: No    Review of Systems  Constitutional:       Per HPI, otherwise negative  HENT:       Per HPI, otherwise negative  Respiratory:       Per HPI, otherwise negative  Cardiovascular:       Per HPI, otherwise negative  Gastrointestinal: Negative for vomiting.  Endocrine:       Negative aside from HPI  Genitourinary:       Neg aside from HPI   Musculoskeletal:       Per HPI, otherwise negative  Skin: Negative.   Neurological: Negative for syncope.      Allergies  Review of patient's allergies indicates no known allergies.  Home Medications   Current Outpatient Rx  Name  Route  Sig  Dispense  Refill  . APPLE CIDER VINEGAR PO   Oral   Take by mouth 2 (two) times daily.         [name] aspirin 81 MG tablet   Oral   Take 1 tablet (81 mg total) by mouth daily.   30 tablet   0   . BEE POLLEN PO   Oral   Take by mouth daily.         [name]  cloNIDine (CATAPRES) 0.3 MG tablet   Oral   Take 0.3 mg by mouth at bedtime.         . fluticasone (FLONASE) 50 MCG/ACT nasal spray   Each Nare   Place 1 spray into both nostrils daily.         . hydrALAZINE (APRESOLINE) 100 MG tablet   Oral   Take 1 tablet (100 mg total) by mouth 2 (two) times daily.   60 tablet   6   . Olmesartan-Amlodipine-HCTZ (TRIBENZOR) 40-10-25 MG TABS   Oral   Take 1 tablet by mouth at bedtime.         . pravastatin (PRAVACHOL) 20 MG tablet   Oral   Take 40 mg by mouth daily.         [name] spironolactone (ALDACTONE) 25 MG tablet   Oral   Take 25 mg by mouth daily.         . tadalafil (CIALIS) 20 MG tablet   Oral   Take 20 mg by mouth daily as needed for erectile dysfunction.          BP 212/92  Pulse 68  Temp(Src) 97.9 F (36.6 C)  SpO2 97% Physical Exam  Nursing note and vitals reviewed. Constitutional: He is oriented to person, place, and time. He appears well-developed. No distress.  HENT:  Head:  Normocephalic and atraumatic.  Eyes: Conjunctivae and EOM are normal.  Cardiovascular: Normal rate and regular rhythm.   Pulmonary/Chest: Effort normal. No stridor. No respiratory distress.  Abdominal: He exhibits no distension.  Musculoskeletal: He exhibits no edema.  Neurological: He is alert and oriented to person, place, and time.  Skin: Skin is warm and dry.  Psychiatric: He has a normal mood and affect.    ED Course  Procedures (including critical care time) Labs Review Labs Reviewed  CBC - Abnormal; Notable for the following:    Hemoglobin 11.1 (*)    HCT 33.9 (*)    MCV 70.6 (*)    MCH 23.1 (*)    All other components within normal limits  POCT I-STAT TROPONIN I - Abnormal; Notable for the following:    Troponin i, poc 0.09 (*)    All other components within normal limits  TROPONIN I  BASIC METABOLIC PANEL  PRO B NATRIURETIC PEPTIDE   Imaging Review Dg Chest 2 View  05/23/2013   CLINICAL DATA:  Chest pain  EXAM: CHEST  2 VIEW  COMPARISON:  January 15, 2013  FINDINGS: There is no edema or consolidation. Heart is mildly enlarged with normal pulmonary vascularity. No adenopathy. Aorta is prominent but stable. No bone lesions.  IMPRESSION: Stable cardiac prominence. Stable prominence of the aorta, probably reflective of chronic hypertensive change. No edema or consolidation.   Electronically Signed   By: Lowella Grip M.D.   On: 05/23/2013 12:47    EKG Interpretation    Date/Time:  Tuesday May 23 2013 12:10:38 EST Ventricular Rate:  72 PR Interval:  170 QRS Duration: 102 QT Interval:  434 QTC Calculation: 475 R Axis:   1 Text Interpretation:  Normal sinus rhythm Possible Left atrial enlargement Left ventricular hypertrophy T wave abnormality, consider lateral ischemia Abnormal ECG Sinus rhythm biphasic t wave - not new Abnormal ekg Confirmed by Carmin Muskrat  MD [phone]) on 05/23/2013 12:33:23 PM           Update: Initial troponin is positive. Repeat  sent  Update: Patient's initial blood pressure was greater than A999333 systolic, [id number] diastolic.  Patient will receive labetalol.  Update: Patient's formal troponin is normal.  On repeat exam the patient appears calm, states he feels better.  Blood pressure has decreased substantially.  Patient's chart is notable for demonstration of catheterization within 6 months, normal anatomy  Patient also had echocardiogram one year ago with ejection fraction 65-70%  I had a lengthy conversation with him and his brother about medication.  Patient states that he has been inconsistent with medication compliance secondary to monetary concerns.   He adds that he has had multiple similar episodes of discomfort over the years after stressful events.  MDM   This patient presents after an episode of pain that resolved.  On my exam patient is awake, alert, hypertensive, but otherwise stable.  Patient's blood pressure decreased hearing with multiple doses of beta blocker.  The patient's evaluation is largely reassuring, aside from abnormal point of care troponin. With no ongoing pain, with normal coronary arteries on catheterization within 6 months, and with the patient's decreased blood pressure, he was discharged to followup with his cardiologist as an outpatient, per his request.  Carmin Muskrat, MD 05/23/13 1524

## 2013-05-23 NOTE — ED Notes (Signed)
Per pt sts episode of chest pain this am while exerting himself. Denies chest pain currently. Denies any other complaints. sts that he felt like his heart was racing and then he sat down and it went away.

## 2013-05-23 NOTE — Discharge Instructions (Signed)
As discussed, it is important to take all medication as directed, and to followup with your physician promptly. You have been provided equivalent replacement for your hypertensive medication, and generic form, 3 pills. Please discuss all medications with your physicians on your followup visit to ensure appropriate dosing and applicability.  Return here for concerning changes in your condition.   Arterial Hypertension Arterial hypertension (high blood pressure) is a condition of elevated pressure in your blood vessels. Hypertension over a long period of time is a risk factor for strokes, heart attacks, and heart failure. It is also the leading cause of kidney (renal) failure.  CAUSES   In Adults -- Over 90% of all hypertension has no known cause. This is called essential or primary hypertension. In the other 10% of people with hypertension, the increase in blood pressure is caused by another disorder. This is called secondary hypertension. Important causes of secondary hypertension are:  Heavy alcohol use.  Obstructive sleep apnea.  Hyperaldosterosim (Conn's syndrome).  Steroid use.  Chronic kidney failure.  Hyperparathyroidism.  Medications.  Renal artery stenosis.  Pheochromocytoma.  Cushing's disease.  Coarctation of the aorta.  Scleroderma renal crisis.  Licorice (in excessive amounts).  Drugs (cocaine, methamphetamine). Your caregiver can explain any items above that apply to you.  In Children -- Secondary hypertension is more common and should always be considered.  Pregnancy -- Few women of childbearing age have high blood pressure. However, up to 10% of them develop hypertension of pregnancy. Generally, this will not harm the woman. It may be a sign of 3 complications of pregnancy: preeclampsia, HELLP syndrome, and eclampsia. Follow up and control with medication is necessary. SYMPTOMS   This condition normally does not produce any noticeable symptoms. It is  usually found during a routine exam.  Malignant hypertension is a late problem of high blood pressure. It may have the following symptoms:  Headaches.  Blurred vision.  End-organ damage (this means your kidneys, heart, lungs, and other organs are being damaged).  Stressful situations can increase the blood pressure. If a person with normal blood pressure has their blood pressure go up while being seen by their caregiver, this is often termed "white coat hypertension." Its importance is not known. It may be related with eventually developing hypertension or complications of hypertension.  Hypertension is often confused with mental tension, stress, and anxiety. DIAGNOSIS  The diagnosis is made by 3 separate blood pressure measurements. They are taken at least 1 week apart from each other. If there is organ damage from hypertension, the diagnosis may be made without repeat measurements. Hypertension is usually identified by having blood pressure readings:  Above 140/90 mmHg measured in both arms, at 3 separate times, over a couple weeks.  Over 130/80 mmHg should be considered a risk factor and may require treatment in patients with diabetes. Blood pressure readings over 120/80 mmHg are called "pre-hypertension" even in non-diabetic patients. To get a true blood pressure measurement, use the following guidelines. Be aware of the factors that can alter blood pressure readings.  Take measurements at least 1 hour after caffeine.  Take measurements 30 minutes after smoking and without any stress. This is another reason to quit smoking  it raises your blood pressure.  Use a proper cuff size. Ask your caregiver if you are not sure about your cuff size.  Most home blood pressure cuffs are automatic. They will measure systolic and diastolic pressures. The systolic pressure is the pressure reading at the start of sounds. Diastolic  pressure is the pressure at which the sounds disappear. If you are  elderly, measure pressures in multiple postures. Try sitting, lying or standing.  Sit at rest for a minimum of 5 minutes before taking measurements.  You should not be on any medications like decongestants. These are found in many cold medications.  Record your blood pressure readings and review them with your caregiver. If you have hypertension:  Your caregiver may do tests to be sure you do not have secondary hypertension (see "causes" above).  Your caregiver may also look for signs of metabolic syndrome. This is also called Syndrome X or Insulin Resistance Syndrome. You may have this syndrome if you have type 2 diabetes, abdominal obesity, and abnormal blood lipids in addition to hypertension.  Your caregiver will take your medical and family history and perform a physical exam.  Diagnostic tests may include blood tests (for glucose, cholesterol, potassium, and kidney function), a urinalysis, or an EKG. Other tests may also be necessary depending on your condition. PREVENTION  There are important lifestyle issues that you can adopt to reduce your chance of developing hypertension:  Maintain a normal weight.  Limit the amount of salt (sodium) in your diet.  Exercise often.  Limit alcohol intake.  Get enough potassium in your diet. Discuss specific advice with your caregiver.  Follow a DASH diet (dietary approaches to stop hypertension). This diet is rich in fruits, vegetables, and low-fat dairy products, and avoids certain fats. PROGNOSIS  Essential hypertension cannot be cured. Lifestyle changes and medical treatment can lower blood pressure and reduce complications. The prognosis of secondary hypertension depends on the underlying cause. Many people whose hypertension is controlled with medicine or lifestyle changes can live a normal, healthy life.  RISKS AND COMPLICATIONS  While high blood pressure alone is not an illness, it often requires treatment due to its short- and  long-term effects on many organs. Hypertension increases your risk for:  CVAs or strokes (cerebrovascular accident).  Heart failure due to chronically high blood pressure (hypertensive cardiomyopathy).  Heart attack (myocardial infarction).  Damage to the retina (hypertensive retinopathy).  Kidney failure (hypertensive nephropathy). Your caregiver can explain list items above that apply to you. Treatment of hypertension can significantly reduce the risk of complications. TREATMENT   For overweight patients, weight loss and regular exercise are recommended. Physical fitness lowers blood pressure.  Mild hypertension is usually treated with diet and exercise. A diet rich in fruits and vegetables, fat-free dairy products, and foods low in fat and salt (sodium) can help lower blood pressure. Decreasing salt intake decreases blood pressure in a 1/3 of people.  Stop smoking if you are a smoker. The steps above are highly effective in reducing blood pressure. While these actions are easy to suggest, they are difficult to achieve. Most patients with moderate or severe hypertension end up requiring medications to bring their blood pressure down to a normal level. There are several classes of medications for treatment. Blood pressure pills (antihypertensives) will lower blood pressure by their different actions. Lowering the blood pressure by 10 mmHg may decrease the risk of complications by as much as 25%. The goal of treatment is effective blood pressure control. This will reduce your risk for complications. Your caregiver will help you determine the best treatment for you according to your lifestyle. What is excellent treatment for one person, may not be for you. HOME CARE INSTRUCTIONS   Do not smoke.  Follow the lifestyle changes outlined in the "Prevention" section.  If you are on medications, follow the directions carefully. Blood pressure medications must be taken as prescribed. Skipping doses  reduces their benefit. It also puts you at risk for problems.  Follow up with your caregiver, as directed.  If you are asked to monitor your blood pressure at home, follow the guidelines in the "Diagnosis" section above. SEEK MEDICAL CARE IF:   You think you are having medication side effects.  You have recurrent headaches or lightheadedness.  You have swelling in your ankles.  You have trouble with your vision. SEEK IMMEDIATE MEDICAL CARE IF:   You have sudden onset of chest pain or pressure, difficulty breathing, or other symptoms of a heart attack.  You have a severe headache.  You have symptoms of a stroke (such as sudden weakness, difficulty speaking, difficulty walking). MAKE SURE YOU:   Understand these instructions.  Will watch your condition.  Will get help right away if you are not doing well or get worse. Document Released: 03/30/2005 Document Revised: 06/22/2011 Document Reviewed: 10/28/2006 [facility] Patient Information 2014 [place].

## 2013-05-23 NOTE — ED Notes (Signed)
Lab results given to Dr.Lockwood.

## 2013-06-13 ENCOUNTER — Ambulatory Visit (INDEPENDENT_AMBULATORY_CARE_PROVIDER_SITE_OTHER): Payer: Medicare Other | Admitting: Nurse Practitioner

## 2013-06-13 ENCOUNTER — Encounter: Payer: Self-pay | Admitting: Nurse Practitioner

## 2013-06-13 ENCOUNTER — Other Ambulatory Visit: Payer: Self-pay | Admitting: *Deleted

## 2013-06-13 VITALS — BP 160/78 | HR 56 | Ht 73.0 in | Wt 316.4 lb

## 2013-06-13 DIAGNOSIS — E876 Hypokalemia: Secondary | ICD-10-CM

## 2013-06-13 DIAGNOSIS — I119 Hypertensive heart disease without heart failure: Secondary | ICD-10-CM

## 2013-06-13 LAB — BASIC METABOLIC PANEL
BUN: 16 mg/dL (ref 6–23)
CO2: 32 mEq/L (ref 19–32)
Calcium: 8.9 mg/dL (ref 8.4–10.5)
Chloride: 98 mEq/L (ref 96–112)
Creatinine, Ser: 1.2 mg/dL (ref 0.4–1.5)
GFR: 65.06 mL/min (ref >60.00)
Glucose, Bld: 94 mg/dL (ref 70–99)
Potassium: 3 mEq/L — ABNORMAL LOW (ref 3.5–5.1)
Sodium: 136 mEq/L (ref 135–145)

## 2013-06-13 MED ORDER — POTASSIUM CHLORIDE CRYS ER 20 MEQ PO TBCR: 20.0000 meq | EXTENDED_RELEASE_TABLET | Freq: Two times a day (BID) | ORAL | Status: DC

## 2013-06-13 MED ORDER — HYDROCHLOROTHIAZIDE 25 MG PO TABS: 25.0000 mg | ORAL_TABLET | Freq: Every day | ORAL | Status: DC

## 2013-06-13 MED ORDER — AMLODIPINE BESYLATE 10 MG PO TABS: 10.0000 mg | ORAL_TABLET | Freq: Every day | ORAL | Status: DC

## 2013-06-13 NOTE — Patient Instructions (Addendum)
Continue with your current medicines - go by the list you are given today  We need to check lab today  See Dr. Martinique in 4 months  Try to restrict your salt, eat healthy, exercise and stay on track with taking care of yourself.  Call the [place] office at [phone] if you have any questions, problems or concerns.
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
## 2013-06-20 ENCOUNTER — Other Ambulatory Visit: Payer: Medicare Other

## 2013-06-20 ENCOUNTER — Encounter: Payer: Self-pay | Admitting: *Deleted

## 2013-06-20 ENCOUNTER — Other Ambulatory Visit: Payer: Self-pay | Admitting: *Deleted

## 2013-06-20 ENCOUNTER — Other Ambulatory Visit (INDEPENDENT_AMBULATORY_CARE_PROVIDER_SITE_OTHER): Payer: Medicare Other

## 2013-06-20 DIAGNOSIS — E876 Hypokalemia: Secondary | ICD-10-CM

## 2013-06-20 LAB — BASIC METABOLIC PANEL
BUN: 16 mg/dL (ref 6–23)
CO2: 30 mEq/L (ref 19–32)
Calcium: 8.9 mg/dL (ref 8.4–10.5)
Chloride: 100 mEq/L (ref 96–112)
Creatinine, Ser: 1.2 mg/dL (ref 0.4–1.5)
GFR: 63.8 mL/min (ref >60.00)
Glucose, Bld: 98 mg/dL (ref 70–99)
Potassium: 3.4 mEq/L — ABNORMAL LOW (ref 3.5–5.1)
Sodium: 138 mEq/L (ref 135–145)

## 2013-06-21 ENCOUNTER — Other Ambulatory Visit: Payer: Medicare Other

## 2013-07-04 ENCOUNTER — Other Ambulatory Visit (INDEPENDENT_AMBULATORY_CARE_PROVIDER_SITE_OTHER): Payer: Medicare Other

## 2013-07-04 DIAGNOSIS — E876 Hypokalemia: Secondary | ICD-10-CM

## 2013-07-04 LAB — BASIC METABOLIC PANEL
BUN: 14 mg/dL (ref 6–23)
CO2: 33 mEq/L — ABNORMAL HIGH (ref 19–32)
Calcium: 9.1 mg/dL (ref 8.4–10.5)
Chloride: 97 mEq/L (ref 96–112)
Creatinine, Ser: 1.2 mg/dL (ref 0.4–1.5)
GFR: 63.8 mL/min (ref >60.00)
Glucose, Bld: 111 mg/dL — ABNORMAL HIGH (ref 70–99)
Potassium: 3.1 mEq/L — ABNORMAL LOW (ref 3.5–5.1)
Sodium: 137 mEq/L (ref 135–145)

## 2013-07-05 ENCOUNTER — Other Ambulatory Visit: Payer: Self-pay | Admitting: *Deleted

## 2013-07-05 DIAGNOSIS — E876 Hypokalemia: Secondary | ICD-10-CM

## 2013-07-07 ENCOUNTER — Other Ambulatory Visit (INDEPENDENT_AMBULATORY_CARE_PROVIDER_SITE_OTHER): Payer: Medicare Other

## 2013-07-07 DIAGNOSIS — E876 Hypokalemia: Secondary | ICD-10-CM

## 2013-07-07 LAB — BASIC METABOLIC PANEL
BUN: 14 mg/dL (ref 6–23)
CO2: 31 mEq/L (ref 19–32)
Calcium: 9.2 mg/dL (ref 8.4–10.5)
Chloride: 98 mEq/L (ref 96–112)
Creatinine, Ser: 1.2 mg/dL (ref 0.4–1.5)
GFR: 66.34 mL/min (ref >60.00)
Glucose, Bld: 118 mg/dL — ABNORMAL HIGH (ref 70–99)
Potassium: 3 mEq/L — ABNORMAL LOW (ref 3.5–5.1)
Sodium: 138 mEq/L (ref 135–145)

## 2013-07-25 ENCOUNTER — Telehealth: Payer: Self-pay | Admitting: Cardiology

## 2013-07-25 NOTE — Telephone Encounter (Signed)
error 

## 2013-09-14 ENCOUNTER — Ambulatory Visit (INDEPENDENT_AMBULATORY_CARE_PROVIDER_SITE_OTHER): Payer: Medicare Other | Admitting: Cardiology

## 2013-09-14 ENCOUNTER — Encounter: Payer: Self-pay | Admitting: Cardiology

## 2013-09-14 VITALS — BP 140/68 | HR 60 | Resp 16 | Ht 73.0 in | Wt 312.8 lb

## 2013-09-14 DIAGNOSIS — I472 Ventricular tachycardia: Secondary | ICD-10-CM

## 2013-09-14 DIAGNOSIS — E876 Hypokalemia: Secondary | ICD-10-CM

## 2013-09-14 DIAGNOSIS — I4729 Other ventricular tachycardia: Secondary | ICD-10-CM

## 2013-09-14 DIAGNOSIS — I10 Essential (primary) hypertension: Secondary | ICD-10-CM

## 2013-09-14 DIAGNOSIS — I119 Hypertensive heart disease without heart failure: Secondary | ICD-10-CM

## 2013-09-14 DIAGNOSIS — I4892 Unspecified atrial flutter: Secondary | ICD-10-CM

## 2013-09-14 MED ORDER — POTASSIUM CHLORIDE CRYS ER 20 MEQ PO TBCR: 40.0000 meq | EXTENDED_RELEASE_TABLET | Freq: Two times a day (BID) | ORAL | Status: DC

## 2013-09-14 NOTE — Progress Notes (Signed)
Skipper [place] Date of Birth: 13-Sep-1945 Medical Record [id number]  History of Present Illness: Mr. Nathan Grant is seen back today for a follow up visit. He has a history of past AF/flutter, HTN, tobacco abuse and HLD. He has hypertensive heart disease with severe LVH.Has had recent Myoview in February of 2014 due to chest pain which showed no ischemia and a normal EF. Was hospitalized back in October of 2014 with chest pain - was cathed - this was normal. Has had noted brief NSVT, accelerated junctional rhythm and bradycardia - not on beta blockers.   On follow up today he states he is doing well. He is taking his medication. Complains of cramping in left thigh at rest. Potassium was 3.0 on last visit and he did not increase potassium as ordered. He is walking 2 miles every other day. He has lost 4 lbs.    Current Outpatient Prescriptions  Medication Sig Dispense Refill  . amLODipine (NORVASC) 10 MG tablet Take 1 tablet (10 mg total) by mouth daily.  30 tablet  11  . APPLE CIDER VINEGAR PO Take 5 mLs by mouth 3 (three) times daily.       [name] aspirin 81 MG tablet Take 1 tablet (81 mg total) by mouth daily.  30 tablet  0  . cloNIDine (CATAPRES) 0.3 MG tablet Take 0.3 mg by mouth at bedtime.      . fluticasone (FLONASE) 50 MCG/ACT nasal spray Place 1 spray into both nostrils daily as needed for allergies.       . hydrALAZINE (APRESOLINE) 100 MG tablet Take 1 tablet (100 mg total) by mouth 2 (two) times daily.  60 tablet  6  . hydrochlorothiazide (HYDRODIURIL) 25 MG tablet Take 1 tablet (25 mg total) by mouth daily.  30 tablet  11  . Multiple Vitamins-Minerals (CENTRUM SILVER PO) Take 1 tablet by mouth daily.      . potassium chloride SA (K-DUR,KLOR-CON) 20 MEQ tablet Take 2 tablets (40 mEq total) by mouth 2 (two) times daily.  120 tablet  6  . BEE POLLEN PO Take 1 Package by mouth daily.        No current facility-administered medications for this visit.    No Known Allergies  Past Medical  History  Diagnosis Date  . Atrial flutter     s/p cardioversion 06/2010  . HTN (hypertension)   . Morbid obesity   . Osteoarthritis   . Degenerative joint disease      End-stage degenerative joint disease, left knee  . Tobacco chew use      History of chewing tobacco usage.   . Urinary tract infection      Pseudomonas urinary tract infection  . Obesity   . Microcytic anemia  03/22/2001  . Chronic headaches   . Erectile dysfunction   . Tendonitis   . Hypertensive heart disease     a. Admit for CP 01/2013 felt due to this. Cath with normal coronary anatomy.  . NSVT (nonsustained ventricular tachycardia)     a. During 01/2013 adm.  . Bradycardia     a. Accelerated junctional & sinus bradycardia during 01/2013 adm.    Past Surgical History  Procedure Laterality Date  . Total knee arthroplasty  May 2009    Bilateral  . Cardioversion  07/01/2010  . Vasectomy      History  Smoking status  . Never Smoker   Smokeless tobacco  . Current User  . Types: Chew    History  Alcohol Use  No    Family History  Problem Relation Age of Onset  . Alcohol abuse Brother   . Hypertension Brother   . Hypertension Mother     Review of Systems: The review of systems is per the HPI.  All other systems were reviewed and are negative.  Physical Exam: BP 140/68  Pulse 60  Resp 16  Ht 6\' 1"  (1.854 m)  Wt 312 lb 12.8 oz (141.885 kg)  BMI 41.28 kg/m2 Patient is very pleasant, obese BM  in no acute distress. Skin is warm and dry. Color is normal.  HEENT is unremarkable. Normocephalic/atraumatic. PERRL. Sclera are nonicteric. Neck is supple. No masses. No JVD. Lungs are clear. Cardiac exam shows a regular rate and rhythm. Abdomen is soft. Extremities are without edema. Gait and ROM are intact. No gross neurologic deficits noted.  Wt Readings from Last 3 Encounters:  09/14/13 312 lb 12.8 oz (141.885 kg)  06/13/13 316 lb 6.4 oz (143.518 kg)  05/02/13 317 lb 6.4 oz (143.972 kg)      LABORATORY DATA: BMET is pending  Lab Results  Component Value Date   WBC 4.8 05/23/2013   HGB 11.1* 05/23/2013   HCT 33.9* 05/23/2013   PLT 254 05/23/2013   GLUCOSE 118* 07/07/2013   CHOL 142 05/30/2012   TRIG 70 05/30/2012   HDL 54 05/30/2012   LDLCALC 74 05/30/2012   ALT 20 05/29/2012   AST 30 05/29/2012   NA 138 07/07/2013   K 3.0* 07/07/2013   CL 98 07/07/2013   CREATININE 1.2 07/07/2013   BUN 14 07/07/2013   CO2 31 07/07/2013   INR 1.11 01/16/2013     Assessment / Plan: 1. Hypertensive heart disease with severe LVH - BP appears to be well controlled today. I have left him on his current regimen.  See him back in 3 months with fasting lab work.  2. Diastolic dysfunction - looks compensated.   3. Obesity   4. Chest pain - negative cardiac cath in October of 2014.  5. Hypokalemia. Increase potassium supplement to 40 meq bid. Recheck lab next visit.  Patient is agreeable to this plan and will call if any problems develop in the interim.   Burtis Junes, RN, [place]  [address] [place]  [place], [place] [address]  [phone]

## 2013-09-14 NOTE — Patient Instructions (Signed)
Increase your potassium to 2 tablets twice a day  Continue your other medication.   Keep up your efforts at weight loss and exercise. I will see you in 3 months with lab work.

## 2013-11-30 ENCOUNTER — Other Ambulatory Visit: Payer: Self-pay | Admitting: Nurse Practitioner

## 2013-12-08 ENCOUNTER — Telehealth: Payer: Self-pay | Admitting: Cardiology

## 2013-12-08 NOTE — Telephone Encounter (Signed)
Nathan Grant is calling because he is having chest pains and shortness of breath , and feeling fatique , heart racing,. The chest pains are when he is moving around and while at rest heart is racing . Please call    Thanks

## 2013-12-08 NOTE — Telephone Encounter (Signed)
Returned call to patient. He reports chest pain with exertion >> assoicated with fatigue and heart racing.   He complains of SOB and had a "banging" headache 4 days ago.   He reports chest pain and SOB this AM while cooking breakfast and had to sit down and rest to get relief.   He does not have PRN NTG to use.   He reports the symptoms have been going on for 2 weeks and are not worse but have become more frequent.    RN consulted with Dr. Martinique -- he advised that if he feels his heart is racing/having chest pain that he should go to ED for evaluation.

## 2013-12-08 NOTE — Telephone Encounter (Signed)
Spoke with patient. He is aware of recommendations to go to ER. He wishes to rest up and go to ER in AM.

## 2013-12-08 NOTE — Telephone Encounter (Signed)
Trish notified

## 2013-12-09 ENCOUNTER — Emergency Department (HOSPITAL_COMMUNITY)
Admission: EM | Admit: 2013-12-09 | Discharge: 2013-12-09 | Disposition: A | Discharge status: Home / Self Care | Payer: Medicare Other | Attending: Emergency Medicine | Admitting: Emergency Medicine

## 2013-12-09 ENCOUNTER — Encounter (HOSPITAL_COMMUNITY): Payer: Self-pay | Admitting: Emergency Medicine

## 2013-12-09 ENCOUNTER — Emergency Department (HOSPITAL_COMMUNITY): Payer: Medicare Other

## 2013-12-09 DIAGNOSIS — I200 Unstable angina: Secondary | ICD-10-CM

## 2013-12-09 DIAGNOSIS — Z87440 Personal history of urinary (tract) infections: Secondary | ICD-10-CM | POA: Diagnosis not present

## 2013-12-09 DIAGNOSIS — I119 Hypertensive heart disease without heart failure: Secondary | ICD-10-CM | POA: Insufficient documentation

## 2013-12-09 DIAGNOSIS — R0602 Shortness of breath: Secondary | ICD-10-CM | POA: Diagnosis present

## 2013-12-09 DIAGNOSIS — Z79899 Other long term (current) drug therapy: Secondary | ICD-10-CM | POA: Insufficient documentation

## 2013-12-09 DIAGNOSIS — I509 Heart failure, unspecified: Secondary | ICD-10-CM

## 2013-12-09 DIAGNOSIS — I110 Hypertensive heart disease with heart failure: Secondary | ICD-10-CM

## 2013-12-09 DIAGNOSIS — I4729 Other ventricular tachycardia: Secondary | ICD-10-CM

## 2013-12-09 DIAGNOSIS — Z7982 Long term (current) use of aspirin: Secondary | ICD-10-CM | POA: Diagnosis not present

## 2013-12-09 DIAGNOSIS — I472 Ventricular tachycardia: Secondary | ICD-10-CM

## 2013-12-09 DIAGNOSIS — G8929 Other chronic pain: Secondary | ICD-10-CM | POA: Diagnosis not present

## 2013-12-09 DIAGNOSIS — I10 Essential (primary) hypertension: Secondary | ICD-10-CM

## 2013-12-09 DIAGNOSIS — Z862 Personal history of diseases of the blood and blood-forming organs and certain disorders involving the immune mechanism: Secondary | ICD-10-CM | POA: Insufficient documentation

## 2013-12-09 DIAGNOSIS — M1990 Unspecified osteoarthritis, unspecified site: Secondary | ICD-10-CM | POA: Insufficient documentation

## 2013-12-09 DIAGNOSIS — I4892 Unspecified atrial flutter: Secondary | ICD-10-CM

## 2013-12-09 LAB — CBC WITH DIFFERENTIAL/PLATELET
BASOS ABS: 0 10*3/uL (ref 0.0–0.1)
Basophils Relative: 1 % (ref 0–1)
EOS ABS: 0.1 10*3/uL (ref 0.0–0.7)
Eosinophils Relative: 3 % (ref 0–5)
HCT: 38.7 % — ABNORMAL LOW (ref 39.0–52.0)
HEMOGLOBIN: 12.8 g/dL — AB (ref 13.0–17.0)
LYMPHS PCT: 31 % (ref 12–46)
Lymphs Abs: 1.5 10*3/uL (ref 0.7–4.0)
MCH: 22.8 pg — AB (ref 26.0–34.0)
MCHC: 33.1 g/dL (ref 30.0–36.0)
MCV: 68.9 fL — ABNORMAL LOW (ref 78.0–100.0)
Monocytes Absolute: 0.4 10*3/uL (ref 0.1–1.0)
Monocytes Relative: 8 % (ref 3–12)
NEUTROS ABS: 2.8 10*3/uL (ref 1.7–7.7)
Neutrophils Relative %: 57 % (ref 43–77)
Platelets: 307 10*3/uL (ref 150–400)
RBC: 5.62 MIL/uL (ref 4.22–5.81)
RDW: 14.9 % (ref 11.5–15.5)
WBC: 4.8 10*3/uL (ref 4.0–10.5)

## 2013-12-09 LAB — I-STAT TROPONIN, ED
TROPONIN I, POC: 0.07 ng/mL (ref 0.00–0.08)
Troponin i, poc: 0.07 ng/mL (ref 0.00–0.08)

## 2013-12-09 LAB — URINALYSIS, ROUTINE W REFLEX MICROSCOPIC
Bilirubin Urine: NEGATIVE
Glucose, UA: NEGATIVE mg/dL
Hgb urine dipstick: NEGATIVE
KETONES UR: NEGATIVE mg/dL
NITRITE: NEGATIVE
PH: 6 (ref 5.0–8.0)
Protein, ur: NEGATIVE mg/dL
Specific Gravity, Urine: 1.017 (ref 1.005–1.030)
Urobilinogen, UA: 1 mg/dL (ref 0.0–1.0)

## 2013-12-09 LAB — PROTIME-INR
INR: 1.12 (ref 0.00–1.49)
Prothrombin Time: 14.4 seconds (ref 11.6–15.2)

## 2013-12-09 LAB — URINE MICROSCOPIC-ADD ON

## 2013-12-09 LAB — COMPREHENSIVE METABOLIC PANEL
ALBUMIN: 4.1 g/dL (ref 3.5–5.2)
ALT: 23 U/L (ref 0–53)
ANION GAP: 13 (ref 5–15)
AST: 32 U/L (ref 0–37)
Alkaline Phosphatase: 50 U/L (ref 39–117)
BILIRUBIN TOTAL: 0.6 mg/dL (ref 0.3–1.2)
BUN: 25 mg/dL — AB (ref 6–23)
CHLORIDE: 98 meq/L (ref 96–112)
CO2: 28 mEq/L (ref 19–32)
Calcium: 9.3 mg/dL (ref 8.4–10.5)
Creatinine, Ser: 1.21 mg/dL (ref 0.50–1.35)
GFR calc Af Amer: 69 mL/min — ABNORMAL LOW (ref >90)
GFR calc non Af Amer: 59 mL/min — ABNORMAL LOW (ref >90)
Glucose, Bld: 97 mg/dL (ref 70–99)
POTASSIUM: 3.5 meq/L — AB (ref 3.7–5.3)
Sodium: 139 mEq/L (ref 137–147)
TOTAL PROTEIN: 7.7 g/dL (ref 6.0–8.3)

## 2013-12-09 LAB — PRO B NATRIURETIC PEPTIDE: Pro B Natriuretic peptide (BNP): 491.5 pg/mL — ABNORMAL HIGH (ref 0–125)

## 2013-12-09 MED ORDER — METOPROLOL SUCCINATE ER 25 MG PO TB24
25.0000 mg | ORAL_TABLET | Freq: Every day | ORAL | Status: DC
  Administered 2013-12-09: 25 mg via ORAL
  Filled 2013-12-09: qty 1

## 2013-12-09 MED ORDER — METOPROLOL SUCCINATE ER 25 MG PO TB24: 25.0000 mg | ORAL_TABLET | Freq: Every day | ORAL | Status: DC

## 2013-12-09 MED ORDER — SODIUM CHLORIDE 0.9 % IV SOLN
INTRAVENOUS | Status: DC
  Administered 2013-12-09: 10:00:00 via INTRAVENOUS

## 2013-12-09 NOTE — ED Notes (Signed)
Pt remains monitored by blood pressure, pulse ox, and 5 lead. Pts family remains at bedside.  

## 2013-12-09 NOTE — ED Notes (Signed)
Pt remains monitored by blood presssure, pulse ox, and 5 lead. Pts family remains at bedside.

## 2013-12-09 NOTE — ED Notes (Signed)
Pt remains monitored by blood pressure pulse ox, and 5 lead. Pts family remains at bedside.

## 2013-12-09 NOTE — ED Notes (Signed)
Pt. Refused a wheelchair

## 2013-12-09 NOTE — ED Notes (Signed)
Pt remains monitored by blood pressure, pulse ox, and 12 lead. Pts family remains at bedside.  

## 2013-12-09 NOTE — ED Notes (Signed)
Patient transported to X-ray 

## 2013-12-09 NOTE — ED Provider Notes (Addendum)
CSN: [id number]     Arrival date & time 12/09/13  0746 History   First MD Initiated Contact with Patient 12/09/13 [phone]     Chief Complaint  Patient presents with  . Shortness of Breath     (Consider location/radiation/quality/duration/timing/severity/associated sxs/prior Treatment) The history is provided by the patient.   a 68 year old male with a history of exertional chest pain shortness of breath for 2 weeks. Can walk out to the parking lot and will get chest pain rest pain goes away. Suggestive of angina type chest pain. Patient is not known to have any heart attacks in the past. At rest no chest pain at all. When it occurs is substernal area and sharp in nature. Nonradiating it is associated with shortness of breath. No passing out. Patient does have bilateral leg swelling but it is improving. Patient is followed by local cardiology.  Past Medical History  Diagnosis Date  . Atrial flutter     s/p cardioversion 06/2010  . HTN (hypertension)   . Morbid obesity   . Osteoarthritis   . Degenerative joint disease      End-stage degenerative joint disease, left knee  . Tobacco chew use      History of chewing tobacco usage.   . Urinary tract infection      Pseudomonas urinary tract infection  . Obesity   . Microcytic anemia  03/22/2001  . Chronic headaches   . Erectile dysfunction   . Tendonitis   . Hypertensive heart disease     a. Admit for CP 01/2013 felt due to this. Cath with normal coronary anatomy.  . NSVT (nonsustained ventricular tachycardia)     a. During 01/2013 adm.  . Bradycardia     a. Accelerated junctional & sinus bradycardia during 01/2013 adm.   Past Surgical History  Procedure Laterality Date  . Total knee arthroplasty  May 2009    Bilateral  . Cardioversion  07/01/2010  . Vasectomy     Family History  Problem Relation Age of Onset  . Alcohol abuse Brother   . Hypertension Brother   . Hypertension Mother    History  Substance Use Topics  . Smoking  status: Never Smoker   . Smokeless tobacco: Current User    Types: Chew  . Alcohol Use: No    Review of Systems  Constitutional: Negative for fever.  HENT: Negative for congestion.   Eyes: Positive for redness.  Respiratory: Positive for shortness of breath.   Cardiovascular: Positive for chest pain and leg swelling.  Gastrointestinal: Negative for abdominal pain.  Genitourinary: Negative for dysuria.  Musculoskeletal: Negative for back pain.  Skin: Negative for rash.  Neurological: Negative for headaches.  Hematological: Does not bruise/bleed easily.  Psychiatric/Behavioral: Negative for confusion.      Allergies  Review of patient's allergies indicates no known allergies.  Home Medications   Prior to Admission medications   Medication Sig Start Date End Date Taking? Authorizing Provider  amLODipine (NORVASC) 10 MG tablet Take 1 tablet (10 mg total) by mouth daily. 06/13/13  Yes Burtis Junes, NP  APPLE CIDER VINEGAR PO Take 5 mLs by mouth 2 (two) times daily.    Yes Historical Provider, MD  aspirin EC 81 MG tablet Take 81 mg by mouth daily.   Yes Historical Provider, MD  cloNIDine (CATAPRES) 0.3 MG tablet Take 0.3 mg by mouth at bedtime.   Yes Historical Provider, MD  hydrALAZINE (APRESOLINE) 50 MG tablet Take 50 mg by mouth 3 (three) times daily.  Yes Historical Provider, MD  hydrochlorothiazide (HYDRODIURIL) 25 MG tablet Take 1 tablet (25 mg total) by mouth daily. 06/13/13  Yes Burtis Junes, NP  Multiple Vitamins-Minerals (CENTRUM SILVER PO) Take 1 tablet by mouth daily.   Yes Historical Provider, MD  potassium chloride SA (K-DUR,KLOR-CON) 20 MEQ tablet Take 20 mEq by mouth 3 (three) times daily.   Yes Historical Provider, MD  metoprolol succinate (TOPROL-XL) 25 MG 24 hr tablet Take 1 tablet (25 mg total) by mouth daily. 12/09/13   Fredia Sorrow, MD   BP 148/77  Pulse 74  Temp(Src) 98.4 F (36.9 C) (Oral)  Resp 20  SpO2 98% Physical Exam  Constitutional: He is  oriented to person, place, and time. He appears well-developed and well-nourished. No distress.  HENT:  Head: Normocephalic and atraumatic.  Mouth/Throat: Oropharynx is clear and moist.  Eyes: Conjunctivae and EOM are normal. Pupils are equal, round, and reactive to light.  Neck: Normal range of motion. Neck supple.  Cardiovascular: Normal rate, regular rhythm and normal heart sounds.   Pulmonary/Chest: Effort normal and breath sounds normal. No respiratory distress.  Abdominal: Soft. Bowel sounds are normal. There is no tenderness.  Musculoskeletal: He exhibits edema.  Bilateral leg edema.  Neurological: He is alert and oriented to person, place, and time. No cranial nerve deficit. He exhibits normal muscle tone. Coordination normal.  Skin: Skin is warm. No rash noted.    ED Course  Procedures (including critical care time) Labs Review Labs Reviewed  CBC WITH DIFFERENTIAL - Abnormal; Notable for the following:    Hemoglobin 12.8 (*)    HCT 38.7 (*)    MCV 68.9 (*)    MCH 22.8 (*)    All other components within normal limits  COMPREHENSIVE METABOLIC PANEL - Abnormal; Notable for the following:    Potassium 3.5 (*)    BUN 25 (*)    GFR calc non Af Amer 59 (*)    GFR calc Af Amer 69 (*)    All other components within normal limits  PRO B NATRIURETIC PEPTIDE - Abnormal; Notable for the following:    Pro B Natriuretic peptide (BNP) 491.5 (*)    All other components within normal limits  URINALYSIS, ROUTINE W REFLEX MICROSCOPIC - Abnormal; Notable for the following:    APPearance CLOUDY (*)    Leukocytes, UA SMALL (*)    All other components within normal limits  PROTIME-INR  URINE MICROSCOPIC-ADD ON  I-STAT TROPOININ, ED  I-STAT TROPOININ, ED   Results for orders placed during the hospital encounter of 12/09/13  CBC WITH DIFFERENTIAL      Result Value Ref Range   WBC 4.8  4.0 - 10.5 K/uL   RBC 5.62  4.22 - 5.81 MIL/uL   Hemoglobin 12.8 (*) 13.0 - 17.0 g/dL   HCT 38.7 (*)  39.0 - 52.0 %   MCV 68.9 (*) 78.0 - 100.0 fL   MCH 22.8 (*) 26.0 - 34.0 pg   MCHC 33.1  30.0 - 36.0 g/dL   RDW 14.9  11.5 - 15.5 %   Platelets 307  150 - 400 K/uL   Neutrophils Relative % 57  43 - 77 %   Lymphocytes Relative 31  12 - 46 %   Monocytes Relative 8  3 - 12 %   Eosinophils Relative 3  0 - 5 %   Basophils Relative 1  0 - 1 %   Neutro Abs 2.8  1.7 - 7.7 K/uL   Lymphs Abs 1.5  0.7 - 4.0 K/uL   Monocytes Absolute 0.4  0.1 - 1.0 K/uL   Eosinophils Absolute 0.1  0.0 - 0.7 K/uL   Basophils Absolute 0.0  0.0 - 0.1 K/uL   RBC Morphology TARGET CELLS    COMPREHENSIVE METABOLIC PANEL      Result Value Ref Range   Sodium 139  137 - 147 mEq/L   Potassium 3.5 (*) 3.7 - 5.3 mEq/L   Chloride 98  96 - 112 mEq/L   CO2 28  19 - 32 mEq/L   Glucose, Bld 97  70 - 99 mg/dL   BUN 25 (*) 6 - 23 mg/dL   Creatinine, Ser 1.21  0.50 - 1.35 mg/dL   Calcium 9.3  8.4 - 10.5 mg/dL   Total Protein 7.7  6.0 - 8.3 g/dL   Albumin 4.1  3.5 - 5.2 g/dL   AST 32  0 - 37 U/L   ALT 23  0 - 53 U/L   Alkaline Phosphatase 50  39 - 117 U/L   Total Bilirubin 0.6  0.3 - 1.2 mg/dL   GFR calc non Af Amer 59 (*) >90 mL/min   GFR calc Af Amer 69 (*) >90 mL/min   Anion gap 13  5 - 15  PRO B NATRIURETIC PEPTIDE      Result Value Ref Range   Pro B Natriuretic peptide (BNP) 491.5 (*) 0 - 125 pg/mL  PROTIME-INR      Result Value Ref Range   Prothrombin Time 14.4  11.6 - 15.2 seconds   INR 1.12  0.00 - 1.49  URINALYSIS, ROUTINE W REFLEX MICROSCOPIC      Result Value Ref Range   Color, Urine YELLOW  YELLOW   APPearance CLOUDY (*) CLEAR   Specific Gravity, Urine 1.017  1.005 - 1.030   pH 6.0  5.0 - 8.0   Glucose, UA NEGATIVE  NEGATIVE mg/dL   Hgb urine dipstick NEGATIVE  NEGATIVE   Bilirubin Urine NEGATIVE  NEGATIVE   Ketones, ur NEGATIVE  NEGATIVE mg/dL   Protein, ur NEGATIVE  NEGATIVE mg/dL   Urobilinogen, UA 1.0  0.0 - 1.0 mg/dL   Nitrite NEGATIVE  NEGATIVE   Leukocytes, UA SMALL (*) NEGATIVE  URINE  MICROSCOPIC-ADD ON      Result Value Ref Range   Squamous Epithelial / LPF RARE  RARE   WBC, UA 7-10  <3 WBC/hpf  I-STAT TROPOININ, ED      Result Value Ref Range   Troponin i, poc 0.07  0.00 - 0.08 ng/mL   Comment 3           I-STAT TROPOININ, ED      Result Value Ref Range   Troponin i, poc 0.07  0.00 - 0.08 ng/mL   Comment 3              Imaging Review Dg Chest 2 View  12/09/2013   CLINICAL DATA:  Shortness of breath  EXAM: CHEST  2 VIEW  COMPARISON:  05/23/2013  FINDINGS: The heart size and mediastinal contours are within normal limits. Both lungs are clear. The visualized skeletal structures are unremarkable.  IMPRESSION: No active cardiopulmonary disease.   Electronically Signed   By: Inez Catalina M.D.   On: 12/09/2013 10:42     EKG Interpretation   Date/Time:  Saturday December 09 2013 07:54:49 EDT Ventricular Rate:  81 PR Interval:  168 QRS Duration: 102 QT Interval:  414 QTC Calculation: 480 R Axis:   -20 Text Interpretation:  Normal sinus rhythm Minimal voltage criteria for  LVH, may be normal variant Nonspecific T wave abnormality Prolonged QT  Abnormal ECG No significant change since last tracing Confirmed by  Dlynn Ranes  MD, Dotty Gonzalo [phone]) on 12/09/2013 9:17:06 AM      MDM   Final diagnoses:  Unstable angina   Patient with 2 week history of exertional chest pain and shortness of breath. Patient walking to the parking lot will get substernal chest pain will rest until resolved. Patient's cardiologist is Peter Martinique. Patient's symptoms are consistent with unstable angina picture. Patient's labs without any acute cardiac findings. Troponin is normal chest x-ray no active disease. No evidence of pulmonary edema no pneumothorax or pneumonia. Patient's EKG with some nonspecific T wave changes only. Will discuss with cardiology.  Repeat troponin is negative. The prescriptions provided for the patient continue on the beta blocker. Followup with cardiology range.  Fredia Sorrow, MD 12/09/13 [place], MD 12/09/13 [phone]

## 2013-12-09 NOTE — ED Notes (Signed)
Pt. Stated, for the last 2 weeks every time I walk any distance at all I get SOB and just feel tight.

## 2013-12-09 NOTE — ED Notes (Signed)
Pt refused wheelchair.  

## 2013-12-09 NOTE — Discharge Instructions (Signed)
Return for any chest pain lasting 15 or 20 minutes or longer. Followup with cardiology next few days. Start a new medication as per cardiology's recommendation.

## 2013-12-09 NOTE — Consult Note (Signed)
CARDIOLOGY CONSULT NOTE   Patient ID: Nathan Grant MRN: [id number], DOB/AGE: 1945/05/02   Admit date: 12/09/2013 Date of Consult: 12/09/2013  Primary Physician: Phineas Inches, MD Primary Cardiologist: Dr. Martinique  Reason for consult:  Chest pain, SOB  Problem List  Past Medical History  Diagnosis Date  . Atrial flutter     s/p cardioversion 06/2010  . HTN (hypertension)   . Morbid obesity   . Osteoarthritis   . Degenerative joint disease      End-stage degenerative joint disease, left knee  . Tobacco chew use      History of chewing tobacco usage.   . Urinary tract infection      Pseudomonas urinary tract infection  . Obesity   . Microcytic anemia  03/22/2001  . Chronic headaches   . Erectile dysfunction   . Tendonitis   . Hypertensive heart disease     a. Admit for CP 01/2013 felt due to this. Cath with normal coronary anatomy.  . NSVT (nonsustained ventricular tachycardia)     a. During 01/2013 adm.  . Bradycardia     a. Accelerated junctional & sinus bradycardia during 01/2013 adm.    Past Surgical History  Procedure Laterality Date  . Total knee arthroplasty  May 2009    Bilateral  . Cardioversion  07/01/2010  . Vasectomy      Allergies  No Known Allergies  HPI   68 year old male with morbid obesity, HTN, AF/flutter, tobacco abuse and HLD followed by Dr Martinique, last seen in June 2015. The patient is presenting with chest pain, SOB and palpitations. He states that he is having chest pains with exertion, it is retrosternal, pressure like and feels exactly the same as the last year prior to his cath.   He has hypertensive heart disease with severe LVH. Has had recent Myoview in February of 2014 due to chest pain which showed no ischemia and a normal EF. Was hospitalized back in October of 2014 with chest pain - was cathed - this was normal. Has had noted brief NSVT, accelerated junctional rhythm and bradycardia - not on beta blockers.  On follow up today  he states he is doing well. He is taking his medication.   Inpatient Medications  amLODipine (NORVASC) 10 MG tablet, Take 1 tablet (10 mg total) by mouth daily.,  aspirin EC 81 MG tablet, Take 81 mg by mouth daily. hydrALAZINE (APRESOLINE) 50 MG tablet, Take 50 mg by mouth 3 (three) times daily. potassium chloride SA (K-DUR,KLOR-CON) 20 MEQ tablet, Take 20 mEq by mouth 3 (three) times daily.,   Family History Family History  Problem Relation Age of Onset  . Alcohol abuse Brother   . Hypertension Brother   . Hypertension Mother      Social History History   Social History  . Marital Status: Married    Spouse Name: N/A    Number of Children: 6  . Years of Education: N/A   Occupational History  . Day Care   .     Social History Main Topics  . Smoking status: Never Smoker   . Smokeless tobacco: Current User    Types: Chew  . Alcohol Use: No  . Drug Use: No  . Sexual Activity: Yes   Other Topics Concern  . Not on file   Social History Narrative  . No narrative on file    Review of Systems  General:  No chills, fever, night sweats or weight changes.  Cardiovascular:  No  chest pain, dyspnea on exertion, edema, orthopnea, palpitations, paroxysmal nocturnal dyspnea. Dermatological: No rash, lesions/masses Respiratory: No cough, dyspnea Urologic: No hematuria, dysuria Abdominal:   No nausea, vomiting, diarrhea, bright red blood per rectum, melena, or hematemesis Neurologic:  No visual changes, wkns, changes in mental status. All other systems reviewed and are otherwise negative except as noted above.  Physical Exam  Blood pressure 130/86, pulse 81, temperature 98.4 F (36.9 C), temperature source Oral, resp. rate 19, SpO2 98.00%.  General: Pleasant, NAD Psych: Normal affect. Neuro: Alert and oriented X 3. Moves all extremities spontaneously. HEENT: Normal  Neck: Supple without bruits or JVD. Lungs:  Resp regular and unlabored, CTA. Heart: RRR no s3, s4, 2/6  systolic murmur. Abdomen: Soft, non-tender, non-distended, BS + x 4.  Extremities: No clubbing, cyanosis or edema. DP/PT/Radials 2+ and equal bilaterally.  Labs  No results found for this basename: CKTOTAL, CKMB, TROPONINI,  in the last 72 hours Lab Results  Component Value Date   WBC 4.8 12/09/2013   HGB 12.8* 12/09/2013   HCT 38.7* 12/09/2013   MCV 68.9* 12/09/2013   PLT 307 12/09/2013    Recent Labs Lab 12/09/13 0929  NA 139  K 3.5*  CL 98  CO2 28  BUN 25*  CREATININE 1.21  CALCIUM 9.3  PROT 7.7  BILITOT 0.6  ALKPHOS 50  ALT 23  AST 32  GLUCOSE 97   Lab Results  Component Value Date   CHOL 142 05/30/2012   HDL 54 05/30/2012   LDLCALC 74 05/30/2012   TRIG 70 05/30/2012   Lab Results  Component Value Date   DDIMER 0.39 05/29/2012   Radiology/Studies  Dg Chest 2 View  12/09/2013   CLINICAL DATA:  Shortness of breath  EXAM: CHEST  2 VIEW  COMPARISON:  05/23/2013  FINDINGS: The heart size and mediastinal contours are within normal limits. Both lungs are clear. The visualized skeletal structures are unremarkable.  IMPRESSION: No active cardiopulmonary disease.   Electronically Signed   By: Inez Catalina M.D.   On: 12/09/2013 10:42   Echocardiogram - 05/29/2012 Left ventricle: Wall thickness was increased in a pattern of severe LVH. There is a suggestive of assymetric apical hypertrophy seen in some views. Systolic function was vigorous. The estimated ejection fraction was in the range of 65% to 70%. Doppler parameters are consistent with abnormal left ventricular relaxation (grade 1 diastolic dysfunction). - Left atrium: The atrium was moderately dilated.  ECG:   Cath 01/16/2013 Procedural Findings:  Hemodynamics:  AO 145/77 mean 103 mm Hg  LV 150/20 mm Hg  Coronary angiography:  Coronary dominance: right  Left mainstem: Normal  Left anterior descending (LAD): Very large, normal.  Left circumflex (LCx): large, 3 OM branches, normal.  Right coronary artery (RCA):  Very large, normal.  Left ventriculography: Left ventricular systolic function is normal, LVEF is estimated at 60-65%, there is no significant mitral regurgitation  Final Conclusions:  1. Normal coronary anatomy.  2. Normal LV function.     ASSESSMENT AND PLAN  68 year old male   1. Chest pain, SOB - possibly LVOT gradient, I will add Toprol XL 25 mg po daily. The patient is requesting sl NTG, it might be risky with LVOT gradient.  - The first troponin is negative, ECG is unchanged from prior - The patient would like to be discharged, I would recommend to cycle one more troponin and if negative send home.   2. Hypertensive heart disease with severe LVH - BP appears  to be mildly elevated, add Toprol XL 25 mg po daily  3. Chronic diastolic dysfunction - looks compensated, BNP 490  4. Obesity   5. Hypokalemia - we will replace    Signed, Dorothy Spark, MD, [facility] 12/09/2013, 2:09 PM

## 2013-12-09 NOTE — ED Notes (Signed)
Cardiology at bedside.

## 2013-12-21 ENCOUNTER — Encounter: Payer: Self-pay | Admitting: Cardiology

## 2013-12-21 ENCOUNTER — Ambulatory Visit (INDEPENDENT_AMBULATORY_CARE_PROVIDER_SITE_OTHER): Payer: Medicare Other | Admitting: Cardiology

## 2013-12-21 VITALS — BP 142/80 | HR 44 | Ht 73.0 in | Wt 312.9 lb

## 2013-12-21 DIAGNOSIS — I200 Unstable angina: Secondary | ICD-10-CM

## 2013-12-21 DIAGNOSIS — I483 Typical atrial flutter: Secondary | ICD-10-CM

## 2013-12-21 DIAGNOSIS — I119 Hypertensive heart disease without heart failure: Secondary | ICD-10-CM

## 2013-12-21 DIAGNOSIS — I4892 Unspecified atrial flutter: Secondary | ICD-10-CM

## 2013-12-21 DIAGNOSIS — E7800 Pure hypercholesterolemia, unspecified: Secondary | ICD-10-CM | POA: Insufficient documentation

## 2013-12-21 DIAGNOSIS — I10 Essential (primary) hypertension: Secondary | ICD-10-CM

## 2013-12-21 MED ORDER — PRAVASTATIN SODIUM 40 MG PO TABS: 40.0000 mg | ORAL_TABLET | Freq: Every evening | ORAL | Status: DC

## 2013-12-21 NOTE — Progress Notes (Signed)
Nathan Grant Date of Birth: 1945/07/31 Medical Record [id number]  History of Present Illness: Mr. Fessel is seen back today for a follow up visit. He has a history of past AF/flutter, HTN, tobacco abuse and HLD. He has hypertensive heart disease with severe LVH. He had a Myoview in February of 2014 due to chest pain which showed no ischemia and a normal EF. Was hospitalized back in October of 2014 with chest pain - was cathed - this was normal. Has had noted brief NSVT, accelerated junctional rhythm and bradycardia.  Recently seen by primary Dr. Coletta Memos on 11/28/13. BP 205/110. Labs drawn. Seen in ED on 12/09/13 with chest pain. troponins negative. Seen by Dr. Meda Coffee and started on low dose Toprol. Since then he states he has felt much better. No chest pain or SOB. Denies lightheadedness. Energy level is good. Is not currently on statin therapy.    Current Outpatient Prescriptions  Medication Sig Dispense Refill  . amLODipine (NORVASC) 10 MG tablet Take 1 tablet (10 mg total) by mouth daily.  30 tablet  11  . APPLE CIDER VINEGAR PO Take 5 mLs by mouth 2 (two) times daily.       [name] aspirin EC 81 MG tablet Take 81 mg by mouth daily.      . cloNIDine (CATAPRES) 0.3 MG tablet Take 0.3 mg by mouth at bedtime.      . hydrALAZINE (APRESOLINE) 50 MG tablet Take 50 mg by mouth 3 (three) times daily.      . hydrochlorothiazide (HYDRODIURIL) 25 MG tablet Take 1 tablet (25 mg total) by mouth daily.  30 tablet  11  . metoprolol succinate (TOPROL-XL) 25 MG 24 hr tablet Take 1 tablet (25 mg total) by mouth daily.  30 tablet  1  . Multiple Vitamins-Minerals (CENTRUM SILVER PO) Take 1 tablet by mouth daily.      . potassium chloride SA (K-DUR,KLOR-CON) 20 MEQ tablet Take 20 mEq by mouth 3 (three) times daily.      . tadalafil (CIALIS) 20 MG tablet Take 1 tablet by mouth as needed.      . pravastatin (PRAVACHOL) 40 MG tablet Take 1 tablet (40 mg total) by mouth every evening.  90 tablet  3   No current  facility-administered medications for this visit.    No Known Allergies  Past Medical History  Diagnosis Date  . Atrial flutter     s/p cardioversion 06/2010  . HTN (hypertension)   . Morbid obesity   . Osteoarthritis   . Degenerative joint disease      End-stage degenerative joint disease, left knee  . Tobacco chew use      History of chewing tobacco usage.   . Urinary tract infection      Pseudomonas urinary tract infection  . Obesity   . Microcytic anemia  03/22/2001  . Chronic headaches   . Erectile dysfunction   . Tendonitis   . Hypertensive heart disease     a. Admit for CP 01/2013 felt due to this. Cath with normal coronary anatomy.  . NSVT (nonsustained ventricular tachycardia)     a. During 01/2013 adm.  . Bradycardia     a. Accelerated junctional & sinus bradycardia during 01/2013 adm.    Past Surgical History  Procedure Laterality Date  . Total knee arthroplasty  May 2009    Bilateral  . Cardioversion  07/01/2010  . Vasectomy      History  Smoking status  . Never Smoker   Smokeless  tobacco  . Current User  . Types: Chew    History  Alcohol Use No    Family History  Problem Relation Age of Onset  . Alcohol abuse Brother   . Hypertension Brother   . Hypertension Mother     Review of Systems: The review of systems is per the HPI.  All other systems were reviewed and are negative.  Physical Exam: BP 142/80  Pulse 44  Ht 6\' 1"  (1.854 m)  Wt 312 lb 14.4 oz (141.931 kg)  BMI 41.29 kg/m2 Patient is very pleasant, obese BM  in no acute distress. Skin is warm and dry. Color is normal.  HEENT is unremarkable. Normocephalic/atraumatic. PERRL. Sclera are nonicteric. Neck is supple. No masses. No JVD. Lungs are clear. Cardiac exam shows a regular rate and rhythm. Abdomen is soft. Extremities are without edema. Gait and ROM are intact. No gross neurologic deficits noted.  Wt Readings from Last 3 Encounters:  12/21/13 312 lb 14.4 oz (141.931 kg)   09/14/13 312 lb 12.8 oz (141.885 kg)  06/13/13 316 lb 6.4 oz (143.518 kg)     LABORATORY DATA: BMET is pending  Lab Results  Component Value Date   WBC 4.8 12/09/2013   HGB 12.8* 12/09/2013   HCT 38.7* 12/09/2013   PLT 307 12/09/2013   GLUCOSE 97 12/09/2013   CHOL 142 05/30/2012   TRIG 70 05/30/2012   HDL 54 05/30/2012   LDLCALC 74 05/30/2012   ALT 23 12/09/2013   AST 32 12/09/2013   NA 139 12/09/2013   K 3.5* 12/09/2013   CL 98 12/09/2013   CREATININE 1.21 12/09/2013   BUN 25* 12/09/2013   CO2 28 12/09/2013   INR 1.12 12/09/2013   CHEST 2 VIEW  COMPARISON: 05/23/2013  FINDINGS:  The heart size and mediastinal contours are within normal limits.  Both lungs are clear. The visualized skeletal structures are  unremarkable.  IMPRESSION:  No active cardiopulmonary disease.    Assessment / Plan: 1. Hypertensive heart disease with severe LVH - BP appears to be well controlled today. I have left him on his current regimen.  He is bradycardic on metoprolol but feels better and has no symptoms related to bradycardia. Will continue therapy for now. See him back in 3 months with fasting lab work.  2. Diastolic dysfunction - looks well compensated. CXR clear.   3. Obesity   4. Chest pain - negative cardiac cath in October of 2014.  5. Hypercholesterolemia. Recent LDL 144. Given risk I have recommended starting on statin therapy. He has taken pravastatin in the past and is willing to try again. Will start 40 mg daily and repeat lab in 3 months.

## 2013-12-21 NOTE — Patient Instructions (Signed)
Continue your current therapy  We will add Pravastatin 40 mg daily for your cholesterol.  I will see you in 3 months with fasting lab work

## 2014-03-22 ENCOUNTER — Encounter (HOSPITAL_COMMUNITY): Payer: Self-pay | Admitting: Cardiology

## 2014-03-23 ENCOUNTER — Ambulatory Visit: Payer: Medicare Other | Admitting: Cardiology

## 2014-06-21 ENCOUNTER — Ambulatory Visit: Payer: Medicare Other | Admitting: Cardiology

## 2014-09-06 ENCOUNTER — Encounter: Payer: Self-pay | Admitting: Cardiology

## 2014-09-06 ENCOUNTER — Ambulatory Visit (INDEPENDENT_AMBULATORY_CARE_PROVIDER_SITE_OTHER): Payer: Medicare Other | Admitting: Cardiology

## 2014-09-06 VITALS — BP 190/112 | HR 72 | Ht 73.0 in | Wt 313.6 lb

## 2014-09-06 DIAGNOSIS — I5032 Chronic diastolic (congestive) heart failure: Secondary | ICD-10-CM

## 2014-09-06 DIAGNOSIS — N183 Chronic kidney disease, stage 3 unspecified: Secondary | ICD-10-CM

## 2014-09-06 DIAGNOSIS — I110 Hypertensive heart disease with heart failure: Secondary | ICD-10-CM

## 2014-09-06 DIAGNOSIS — I509 Heart failure, unspecified: Secondary | ICD-10-CM | POA: Diagnosis not present

## 2014-09-06 MED ORDER — FUROSEMIDE 40 MG PO TABS: 40.0000 mg | ORAL_TABLET | Freq: Every day | ORAL | Status: DC

## 2014-09-06 NOTE — Patient Instructions (Addendum)
Continue your current therapy  Your need to restrict your salt intake.  We will add lasix 40 mg daily for your swelling.  We will have you return in 2 weeks for lab work and follow up Low-Sodium Eating Plan Sodium raises blood pressure and causes water to be held in the body. Getting less sodium from food will help lower your blood pressure, reduce any swelling, and protect your heart, liver, and kidneys. We get sodium by adding salt (sodium chloride) to food. Most of our sodium comes from canned, boxed, and frozen foods. Restaurant foods, fast foods, and pizza are also very high in sodium. Even if you take medicine to lower your blood pressure or to reduce fluid in your body, getting less sodium from your food is important. WHAT IS MY PLAN? Most people should limit their sodium intake to 2,300 mg a day. Your health care provider recommends that you limit your sodium intake to __________ a day.  WHAT DO I NEED TO KNOW ABOUT THIS EATING PLAN? For the low-sodium eating plan, you will follow these general guidelines:  Choose foods with a % Daily Value for sodium of less than 5% (as listed on the food label).   Use salt-free seasonings or herbs instead of table salt or sea salt.   Check with your health care provider or pharmacist before using salt substitutes.   Eat fresh foods.  Eat more vegetables and fruits.  Limit canned vegetables. If you do use them, rinse them well to decrease the sodium.   Limit cheese to 1 oz (28 g) per day.   Eat lower-sodium products, often labeled as "lower sodium" or "no salt added."  Avoid foods that contain monosodium glutamate (MSG). MSG is sometimes added to [place] food and some canned foods.  Check food labels (Nutrition Facts labels) on foods to learn how much sodium is in one serving.  Eat more home-cooked food and less restaurant, buffet, and fast food.  When eating at a restaurant, ask that your food be prepared with less salt or none,  if possible.  HOW DO I READ FOOD LABELS FOR SODIUM INFORMATION? The Nutrition Facts label lists the amount of sodium in one serving of the food. If you eat more than one serving, you must multiply the listed amount of sodium by the number of servings. Food labels may also identify foods as:  Sodium free--Less than 5 mg in a serving.  Very low sodium--35 mg or less in a serving.  Low sodium--140 mg or less in a serving.  Light in sodium--50% less sodium in a serving. For example, if a food that usually has 300 mg of sodium is changed to become light in sodium, it will have 150 mg of sodium.  Reduced sodium--25% less sodium in a serving. For example, if a food that usually has 400 mg of sodium is changed to reduced sodium, it will have 300 mg of sodium. WHAT FOODS CAN I EAT? Grains Low-sodium cereals, including oats, puffed wheat and rice, and shredded wheat cereals. Low-sodium crackers. Unsalted rice and pasta. Lower-sodium bread.  Vegetables Frozen or fresh vegetables. Low-sodium or reduced-sodium canned vegetables. Low-sodium or reduced-sodium tomato sauce and paste. Low-sodium or reduced-sodium tomato and vegetable juices.  Fruits Fresh, frozen, and canned fruit. Fruit juice.  Meat and Other Protein Products Low-sodium canned tuna and salmon. Fresh or frozen meat, poultry, seafood, and fish. Lamb. Unsalted nuts. Dried beans, peas, and lentils without added salt. Unsalted canned beans. Homemade soups without salt. Eggs.  Dairy Milk. Soy milk. Ricotta cheese. Low-sodium or reduced-sodium cheeses. Yogurt.  Condiments Fresh and dried herbs and spices. Salt-free seasonings. Onion and garlic powders. Low-sodium varieties of mustard and ketchup. Lemon juice.  Fats and Oils Reduced-sodium salad dressings. Unsalted butter.  Other Unsalted popcorn and pretzels.  The items listed above may not be a complete list of recommended foods or beverages. Contact your dietitian for more  options. WHAT FOODS ARE NOT RECOMMENDED? Grains Instant hot cereals. Bread stuffing, pancake, and biscuit mixes. Croutons. Seasoned rice or pasta mixes. Noodle soup cups. Boxed or frozen macaroni and cheese. Self-rising flour. Regular salted crackers. Vegetables Regular canned vegetables. Regular canned tomato sauce and paste. Regular tomato and vegetable juices. Frozen vegetables in sauces. Salted french fries. Olives. Angie Fava. Relishes. Sauerkraut. Salsa. Meat and Other Protein Products Salted, canned, smoked, spiced, or pickled meats, seafood, or fish. Bacon, ham, sausage, hot dogs, corned beef, chipped beef, and packaged luncheon meats. Salt pork. Jerky. Pickled herring. Anchovies, regular canned tuna, and sardines. Salted nuts. Dairy Processed cheese and cheese spreads. Cheese curds. Blue cheese and cottage cheese. Buttermilk.  Condiments Onion and garlic salt, seasoned salt, table salt, and sea salt. Canned and packaged gravies. Worcestershire sauce. Tartar sauce. Barbecue sauce. Teriyaki sauce. Soy sauce, including reduced sodium. Steak sauce. Fish sauce. Oyster sauce. Cocktail sauce. Horseradish. Regular ketchup and mustard. Meat flavorings and tenderizers. Bouillon cubes. Hot sauce. Tabasco sauce. Marinades. Taco seasonings. Relishes. Fats and Oils Regular salad dressings. Salted butter. Margarine. Ghee. Bacon fat.  Other Potato and tortilla chips. Corn chips and puffs. Salted popcorn and pretzels. Canned or dried soups. Pizza. Frozen entrees and pot pies.  The items listed above may not be a complete list of foods and beverages to avoid. Contact your dietitian for more information. Document Released: 09/19/2001 Document Revised: 04/04/2013 Document Reviewed: 02/01/2013 [facility] Patient Information 2015 [place], [place]. This information is not intended to replace advice given to you by your health care provider. Make sure you discuss any questions you have with your health care  provider.

## 2014-09-06 NOTE — Progress Notes (Signed)
Kellar Badia Date of Birth: July 12, 1945 Medical Record [id number]  History of Present Illness: Mr. Donson is seen for follow up HTN. He has a history of past AF/flutter, HTN, tobacco abuse and HLD. He has hypertensive heart disease with severe LVH.Has had recent Myoview in February of 2014 due to chest pain which showed no ischemia and a normal EF. Was hospitalized back in October of 2014 with chest pain - was cathed - this was normal. Has had noted brief NSVT, accelerated junctional rhythm and bradycardia.   On follow up today he states he is doing well. He states he is compliant with medication. States he uses little salt at the table but eats a high sodium diet with processed meats, chips, canned foods. He is walking 2 miles every other day. He does note some increased swelling. No chest pain or dyspnea.     Current Outpatient Prescriptions  Medication Sig Dispense Refill  . amLODipine (NORVASC) 10 MG tablet Take 1 tablet (10 mg total) by mouth daily. 30 tablet 11  . APPLE CIDER VINEGAR PO Take 5 mLs by mouth 2 (two) times daily.     [name] aspirin EC 81 MG tablet Take 81 mg by mouth daily.    . cloNIDine (CATAPRES) 0.3 MG tablet Take 0.3 mg by mouth at bedtime.    . hydrALAZINE (APRESOLINE) 50 MG tablet Take 50 mg by mouth 3 (three) times daily.    . hydrochlorothiazide (HYDRODIURIL) 25 MG tablet Take 1 tablet (25 mg total) by mouth daily. 30 tablet 11  . metoprolol succinate (TOPROL-XL) 25 MG 24 hr tablet Take 1 tablet (25 mg total) by mouth daily. 30 tablet 1  . Multiple Vitamins-Minerals (CENTRUM SILVER PO) Take 1 tablet by mouth daily.    . potassium chloride SA (K-DUR,KLOR-CON) 20 MEQ tablet Take 20 mEq by mouth 3 (three) times daily.    . pravastatin (PRAVACHOL) 40 MG tablet Take 1 tablet (40 mg total) by mouth every evening. 90 tablet 3  . tadalafil (CIALIS) 20 MG tablet Take 1 tablet by mouth as needed.    . furosemide (LASIX) 40 MG tablet Take 1 tablet (40 mg total) by mouth  daily. 30 tablet 11   No current facility-administered medications for this visit.    No Known Allergies  Past Medical History  Diagnosis Date  . Atrial flutter     s/p cardioversion 06/2010  . HTN (hypertension)   . Morbid obesity   . Osteoarthritis   . Degenerative joint disease      End-stage degenerative joint disease, left knee  . Tobacco chew use      History of chewing tobacco usage.   . Urinary tract infection      Pseudomonas urinary tract infection  . Obesity   . Microcytic anemia  03/22/2001  . Chronic headaches   . Erectile dysfunction   . Tendonitis   . Hypertensive heart disease     a. Admit for CP 01/2013 felt due to this. Cath with normal coronary anatomy.  . NSVT (nonsustained ventricular tachycardia)     a. During 01/2013 adm.  . Bradycardia     a. Accelerated junctional & sinus bradycardia during 01/2013 adm.    Past Surgical History  Procedure Laterality Date  . Total knee arthroplasty  May 2009    Bilateral  . Cardioversion  07/01/2010  . Vasectomy    . Left heart catheterization with coronary angiogram N/A 01/16/2013    Procedure: LEFT HEART CATHETERIZATION WITH CORONARY ANGIOGRAM;  Surgeon: Peter M Martinique, MD;  Location: [facility] CATH LAB;  Service: Cardiovascular;  Laterality: N/A;    History  Smoking status  . Never Smoker   Smokeless tobacco  . Current User  . Types: Chew    History  Alcohol Use No    Family History  Problem Relation Age of Onset  . Alcohol abuse Brother   . Hypertension Brother   . Hypertension Mother     Review of Systems: The review of systems is per the HPI.  All other systems were reviewed and are negative.  Physical Exam: BP 190/112 mmHg  Pulse 72  Ht 6\' 1"  (1.854 m)  Wt 142.248 kg (313 lb 9.6 oz)  BMI 41.38 kg/m2 Patient is very pleasant, obese BM  in no acute distress. Skin is warm and dry. Color is normal.  HEENT is unremarkable. Normocephalic/atraumatic. PERRL. Sclera are nonicteric. Neck is supple. No  masses. No JVD. Lungs are clear. Cardiac exam shows a regular rate and rhythm. Abdomen is soft. Extremities have 1-2 + edema. Gait and ROM are intact. No gross neurologic deficits noted.  Wt Readings from Last 3 Encounters:  09/06/14 142.248 kg (313 lb 9.6 oz)  12/21/13 141.931 kg (312 lb 14.4 oz)  09/14/13 141.885 kg (312 lb 12.8 oz)     LABORATORY DATA:   Lab Results  Component Value Date   WBC 4.8 12/09/2013   HGB 12.8* 12/09/2013   HCT 38.7* 12/09/2013   PLT 307 12/09/2013   GLUCOSE 97 12/09/2013   CHOL 142 05/30/2012   TRIG 70 05/30/2012   HDL 54 05/30/2012   LDLCALC 74 05/30/2012   ALT 23 12/09/2013   AST 32 12/09/2013   NA 139 12/09/2013   K 3.5* 12/09/2013   CL 98 12/09/2013   CREATININE 1.21 12/09/2013   BUN 25* 12/09/2013   CO2 28 12/09/2013   INR 1.12 12/09/2013     Assessment / Plan: 1. Hypertensive heart disease with severe LVH - BP is poorly controlled despite multiple antihypertensive medications. I think this is exacerbated by volume overload and probably some compliance issues. Will start lasix 40 mg daily. Repeat BMET in one week with BNP level and arrange follow up in office in 2 weeks for follow up BP. Instructed in low sodium diet.   2. Diastolic dysfunction - volume overloaded today.  3. Obesity   4. Chest pain - negative cardiac cath in October of 2014.  5. Hypokalemia. Will follow up on lasix.

## 2014-11-06 ENCOUNTER — Ambulatory Visit: Payer: Medicare Other | Admitting: Cardiology

## 2016-06-05 DIAGNOSIS — E7800 Pure hypercholesterolemia, unspecified: Secondary | ICD-10-CM | POA: Diagnosis not present

## 2016-06-05 DIAGNOSIS — I119 Hypertensive heart disease without heart failure: Secondary | ICD-10-CM | POA: Diagnosis not present

## 2016-06-05 DIAGNOSIS — E119 Type 2 diabetes mellitus without complications: Secondary | ICD-10-CM | POA: Diagnosis not present

## 2016-06-05 DIAGNOSIS — I10 Essential (primary) hypertension: Secondary | ICD-10-CM | POA: Diagnosis not present

## 2016-06-05 DIAGNOSIS — N183 Chronic kidney disease, stage 3 (moderate): Secondary | ICD-10-CM | POA: Diagnosis not present

## 2016-06-19 DIAGNOSIS — N183 Chronic kidney disease, stage 3 (moderate): Secondary | ICD-10-CM | POA: Diagnosis not present

## 2016-06-19 DIAGNOSIS — I119 Hypertensive heart disease without heart failure: Secondary | ICD-10-CM | POA: Diagnosis not present

## 2016-06-19 DIAGNOSIS — I10 Essential (primary) hypertension: Secondary | ICD-10-CM | POA: Diagnosis not present

## 2016-06-19 DIAGNOSIS — N529 Male erectile dysfunction, unspecified: Secondary | ICD-10-CM | POA: Diagnosis not present

## 2016-06-19 DIAGNOSIS — E7800 Pure hypercholesterolemia, unspecified: Secondary | ICD-10-CM | POA: Diagnosis not present

## 2016-06-19 DIAGNOSIS — E119 Type 2 diabetes mellitus without complications: Secondary | ICD-10-CM | POA: Diagnosis not present

## 2016-06-24 DIAGNOSIS — Z0100 Encounter for examination of eyes and vision without abnormal findings: Secondary | ICD-10-CM | POA: Diagnosis not present

## 2016-08-27 DIAGNOSIS — R600 Localized edema: Secondary | ICD-10-CM | POA: Diagnosis not present

## 2016-08-27 DIAGNOSIS — Z6841 Body Mass Index (BMI) 40.0 and over, adult: Secondary | ICD-10-CM | POA: Diagnosis not present

## 2016-08-27 DIAGNOSIS — E785 Hyperlipidemia, unspecified: Secondary | ICD-10-CM | POA: Diagnosis not present

## 2016-08-27 DIAGNOSIS — K08109 Complete loss of teeth, unspecified cause, unspecified class: Secondary | ICD-10-CM | POA: Diagnosis not present

## 2016-08-27 DIAGNOSIS — Z7982 Long term (current) use of aspirin: Secondary | ICD-10-CM | POA: Diagnosis not present

## 2016-08-27 DIAGNOSIS — Z0000 Encounter for general adult medical examination without abnormal findings: Secondary | ICD-10-CM | POA: Diagnosis not present

## 2016-08-27 DIAGNOSIS — R011 Cardiac murmur, unspecified: Secondary | ICD-10-CM | POA: Diagnosis not present

## 2016-08-27 DIAGNOSIS — I10 Essential (primary) hypertension: Secondary | ICD-10-CM | POA: Diagnosis not present

## 2016-08-27 DIAGNOSIS — Z79899 Other long term (current) drug therapy: Secondary | ICD-10-CM | POA: Diagnosis not present

## 2016-09-21 DIAGNOSIS — I10 Essential (primary) hypertension: Secondary | ICD-10-CM | POA: Diagnosis not present

## 2016-09-21 DIAGNOSIS — I5032 Chronic diastolic (congestive) heart failure: Secondary | ICD-10-CM | POA: Diagnosis not present

## 2016-09-21 DIAGNOSIS — E119 Type 2 diabetes mellitus without complications: Secondary | ICD-10-CM | POA: Diagnosis not present

## 2016-09-21 DIAGNOSIS — E7800 Pure hypercholesterolemia, unspecified: Secondary | ICD-10-CM | POA: Diagnosis not present

## 2016-10-08 ENCOUNTER — Telehealth: Payer: Self-pay | Admitting: Cardiology

## 2016-10-08 NOTE — Telephone Encounter (Signed)
Received records from [place] for appointment on 10/15/16 with Dr Martinique.  Records put with Dr Doug Sou schedule for 10/15/16. lp

## 2016-10-14 NOTE — Progress Notes (Signed)
Nathan Grant Date of Birth: [birth year] Medical Record #[id number]  History of Present Illness: Nathan Grant is seen for follow up HTN. He was last seen 2 years ago. He has a history of past AF/flutter, HTN, tobacco abuse and HLD. He has hypertensive heart disease with severe LVH. He  had a Myoview in February of 2014 due to chest pain which showed no ischemia and a normal EF. Was hospitalized back in October of 2014 with chest pain - was cathed - this was normal. Has had noted brief NSVT, accelerated junctional rhythm and bradycardia.   On follow up today he states he is doing well. His BP was running as high as 190/100 but is improved today. He states he is compliant with medication. States he uses little salt and is now doing his own cooking- eating much less processed food.  He is walking 2 miles every other day. He does note chronic swelling- this has not changed. No chest pain or dyspnea. No palpitations or dizziness.  Current Outpatient Prescriptions  Medication Sig Dispense Refill  . amLODipine (NORVASC) 10 MG tablet Take 1 tablet (10 mg total) by mouth daily. 30 tablet 11  . APPLE CIDER VINEGAR PO Take 5 mLs by mouth 2 (two) times daily.     [name] aspirin EC 81 MG tablet Take 81 mg by mouth daily.    . hydrochlorothiazide (HYDRODIURIL) 25 MG tablet Take 1 tablet (25 mg total) by mouth daily. 30 tablet 11  . metoprolol succinate (TOPROL-XL) 25 MG 24 hr tablet Take 1 tablet (25 mg total) by mouth daily. 30 tablet 1  . Multiple Vitamins-Minerals (CENTRUM SILVER PO) Take 1 tablet by mouth daily.    . potassium chloride SA (K-DUR,KLOR-CON) 20 MEQ tablet Take 20 mEq by mouth 3 (three) times daily.    . pravastatin (PRAVACHOL) 40 MG tablet Take 1 tablet (40 mg total) by mouth every evening. 90 tablet 3  . tadalafil (CIALIS) 20 MG tablet Take 1 tablet by mouth as needed.     No current facility-administered medications for this visit.     No Known Allergies  Past Medical History:   Diagnosis Date  . Atrial flutter ([place])    s/p cardioversion 06/2010  . Bradycardia    a. Accelerated junctional & sinus bradycardia during 01/2013 adm.  . Chronic headaches   . Degenerative joint disease     End-stage degenerative joint disease, left knee  . Erectile dysfunction   . HTN (hypertension)   . Hypertensive heart disease    a. Admit for CP 01/2013 felt due to this. Cath with normal coronary anatomy.  . Microcytic anemia  03/22/2001  . Morbid obesity ([place])   . NSVT (nonsustained ventricular tachycardia) ([place])    a. During 01/2013 adm.  . Obesity   . Osteoarthritis   . Tendonitis   . Tobacco chew use     History of chewing tobacco usage.   . Urinary tract infection     Pseudomonas urinary tract infection    Past Surgical History:  Procedure Laterality Date  . CARDIOVERSION  07/01/2010  . LEFT HEART CATHETERIZATION WITH CORONARY ANGIOGRAM N/A 01/16/2013   Procedure: LEFT HEART CATHETERIZATION WITH CORONARY ANGIOGRAM;  Surgeon: Dyonna Jaspers M Martinique, MD;  Location: [facility] CATH LAB;  Service: Cardiovascular;  Laterality: N/A;  . TOTAL KNEE ARTHROPLASTY  May 2009   Bilateral  . VASECTOMY      History  Smoking Status  . Never Smoker  Smokeless Tobacco  . Current User  .  Types: Chew    History  Alcohol Use No    Family History  Problem Relation Age of Onset  . Alcohol abuse Brother   . Hypertension Brother   . Hypertension Mother     Review of Systems: The review of systems is per the HPI.  All other systems were reviewed and are negative.  Physical Exam: BP (!) 150/72   Pulse 65   Ht 6\' 1"  (1.854 m)   Wt (!) 321 lb (145.6 kg)   BMI 42.35 kg/m  Patient is very pleasant, obese BM  in no acute distress. Skin is warm and dry. Color is normal.  HEENT is unremarkable. Normocephalic/atraumatic. PERRL. Sclera are nonicteric. Neck is supple. No masses. No JVD. Lungs are clear. Cardiac exam shows a regular rate and rhythm. Abdomen is soft. Extremities have 1-2 + ankle  edema. Gait and ROM are intact. No gross neurologic deficits noted.  Wt Readings from Last 3 Encounters:  10/15/16 (!) 321 lb (145.6 kg)  09/06/14 (!) 313 lb 9.6 oz (142.2 kg)  12/21/13 (!) 312 lb 14.4 oz (141.9 kg)     LABORATORY DATA:   Lab Results  Component Value Date   WBC 4.8 12/09/2013   HGB 12.8 (L) 12/09/2013   HCT 38.7 (L) 12/09/2013   PLT 307 12/09/2013   GLUCOSE 97 12/09/2013   CHOL 142 05/30/2012   TRIG 70 05/30/2012   HDL 54 05/30/2012   LDLCALC 74 05/30/2012   ALT 23 12/09/2013   AST 32 12/09/2013   NA 139 12/09/2013   K 3.5 (L) 12/09/2013   CL 98 12/09/2013   CREATININE 1.21 12/09/2013   BUN 25 (H) 12/09/2013   CO2 28 12/09/2013   INR 1.12 12/09/2013    Labs dated 09/21/16: potassium 3.6, BUN 24, creatinine 1.74. A1c 5.9%.  Dated 11/28/15: cholesterol 141, triglycerides 104, HDL 46, LDL 74.   Ecg today shows NSR with frequent unifocal PVCs. T wave inversion c/w inferolateral ischemia. I have personally reviewed and interpreted this study.   Echo 05/29/12: Study Conclusions  - Left ventricle: Wall thickness was increased in a pattern of severe LVH. There is a suggestive of assymetric apical hypertrphy seen in some views. Systolic function was vigorous. The estimated ejection fraction was in the range of 65% to 70%. Doppler parameters are consistent with abnormal left ventricular relaxation (grade 1 diastolic dysfunction). - Left atrium: The atrium was moderately dilated.  Myoview 05/30/12: Study Result   Nathan Grant is a 71 yo who was admitted with chest pain.  A 2 day Leane Call was ordered for further evaluation.  The patient received an IV injection of Myoview and the resting Myoview images were obtained following brief delay.  The following day the patient received an IV injection of Lexiscan. He had no ST-T wave changes during the injection.  A second dose of Myoview was injected after 45 seconds. Quantitated gated  SPECT images were obtained following brief delay.  The raw data images reveal minimal motion artifact.  The stress Myoview images reveal fairly smooth and homogeneous uptake in all areas of the myocardium.  The resting Myoview images reveals similar pattern of smooth and homogeneous uptake of all areas of the myocardium.  The cine loop images reveal fairly normal thickening and contractility of the myocardium.  The computer algorithm calculates an end diastolic volume of 825 ml.  The end-systolic volume is 053 ml.  Ejection fraction is 37%. Actually into the left ventricular systolic function is higher than the  neck - probably in the range of 55-60%.  The computer derived contours do not match  the area of uptake in the left ventricle.  Impression: No evidence of ischemia The normal left ventricular systolic function by visual inspection.  The computer generated left ventricular function is moderately depressed but I think the computer is clearly underestimating the left ventricular contractility. An echocardiogram performed several days ago confirms normal left ventricular systolic function with an EF of 65-70%.  [name], Brooke Bonito., MD, [facility]    Cardiac cath 01/26/13: Cardiac Catheterization Procedure Note  Name: Nathan Grant MRN: [id number] DOB: Jun 13, 1945  Procedure: Left Heart Cath, Selective Coronary Angiography, LV angiography  Indication: 71 yo BM with history of severe HTN presents with symptoms of increasing chest pain.                                   Procedural Details: The right wrist was prepped, draped, and anesthetized with 1% lidocaine. Using the modified Seldinger technique, a 5 French sheath was introduced into the right radial artery. 3 mg of verapamil was administered through the sheath, weight-based unfractionated heparin was administered intravenously. Standard Judkins catheters were used for selective coronary angiography and left  ventriculography. Catheter exchanges were performed over an exchange length guidewire. There were no immediate procedural complications. A TR band was used for radial hemostasis at the completion of the procedure.  The patient was transferred to the post catheterization recovery area for further monitoring.  Procedural Findings: Hemodynamics: AO 145/77 mean 103 mm Hg LV 150/20 mm Hg  Coronary angiography: Coronary dominance: right  Left mainstem: Normal  Left anterior descending (LAD): Very large, normal.  Left circumflex (LCx): large, 3 OM branches, normal.  Right coronary artery (RCA): Very large, normal.  Left ventriculography: Left ventricular systolic function is normal, LVEF is estimated at 60-65%, there is no significant mitral regurgitation   Final Conclusions:   1. Normal coronary anatomy. 2. Normal LV function.   Recommendations: Medical therapy with focus on BP control. Will add hydralazine to his prior meds.  [name] [facility] 01/16/2013, 5:01 PM  Assessment / Plan: 1. Hypertensive heart disease with severe LVH - BPcontrol is better generally.  He was on hydralazine and clonidine in the past but is no longer. He has some chronic edema related to diastolic dysfunction versus amlodipine. Recommend he wear knee high compression hose daily.  Instructed in low sodium diet.   2. Chronic diastolic CHF - volume status is stable today.   3. Obesity - focus on weight loss and healthy diet.   4. Chest pain - negative cardiac cath in October of 2014.  5. CKD stage 3 secondary to chronic HTN.  6. T wave abnormality. Based on prior evaluation I think this is related to repolarization abnormality with LVH  I will follow up in one year.

## 2016-10-15 ENCOUNTER — Ambulatory Visit (INDEPENDENT_AMBULATORY_CARE_PROVIDER_SITE_OTHER): Payer: Medicare HMO | Admitting: Cardiology

## 2016-10-15 ENCOUNTER — Encounter: Payer: Self-pay | Admitting: Cardiology

## 2016-10-15 VITALS — BP 150/72 | HR 65 | Ht 73.0 in | Wt 321.0 lb

## 2016-10-15 DIAGNOSIS — I5032 Chronic diastolic (congestive) heart failure: Secondary | ICD-10-CM

## 2016-10-15 DIAGNOSIS — I10 Essential (primary) hypertension: Secondary | ICD-10-CM

## 2016-10-15 DIAGNOSIS — E7800 Pure hypercholesterolemia, unspecified: Secondary | ICD-10-CM | POA: Diagnosis not present

## 2016-10-15 DIAGNOSIS — I110 Hypertensive heart disease with heart failure: Secondary | ICD-10-CM

## 2016-10-15 NOTE — Patient Instructions (Signed)
Continue your current therapy  I would recommend you wear knee high compression hose to minimize swelling during the day  I will see you in one year.

## 2016-12-23 DIAGNOSIS — R69 Illness, unspecified: Secondary | ICD-10-CM | POA: Diagnosis not present

## 2016-12-23 DIAGNOSIS — Z125 Encounter for screening for malignant neoplasm of prostate: Secondary | ICD-10-CM | POA: Diagnosis not present

## 2016-12-23 DIAGNOSIS — R972 Elevated prostate specific antigen [PSA]: Secondary | ICD-10-CM | POA: Diagnosis not present

## 2016-12-23 DIAGNOSIS — R3129 Other microscopic hematuria: Secondary | ICD-10-CM | POA: Diagnosis not present

## 2016-12-23 DIAGNOSIS — I129 Hypertensive chronic kidney disease with stage 1 through stage 4 chronic kidney disease, or unspecified chronic kidney disease: Secondary | ICD-10-CM | POA: Diagnosis not present

## 2016-12-23 DIAGNOSIS — R7303 Prediabetes: Secondary | ICD-10-CM | POA: Diagnosis not present

## 2016-12-23 DIAGNOSIS — I119 Hypertensive heart disease without heart failure: Secondary | ICD-10-CM | POA: Diagnosis not present

## 2016-12-23 DIAGNOSIS — Z6841 Body Mass Index (BMI) 40.0 and over, adult: Secondary | ICD-10-CM | POA: Diagnosis not present

## 2016-12-23 DIAGNOSIS — D509 Iron deficiency anemia, unspecified: Secondary | ICD-10-CM | POA: Diagnosis not present

## 2016-12-23 DIAGNOSIS — E785 Hyperlipidemia, unspecified: Secondary | ICD-10-CM | POA: Diagnosis not present

## 2016-12-23 DIAGNOSIS — N183 Chronic kidney disease, stage 3 (moderate): Secondary | ICD-10-CM | POA: Diagnosis not present

## 2016-12-23 DIAGNOSIS — Z809 Family history of malignant neoplasm, unspecified: Secondary | ICD-10-CM | POA: Diagnosis not present

## 2016-12-23 DIAGNOSIS — I10 Essential (primary) hypertension: Secondary | ICD-10-CM | POA: Diagnosis not present

## 2016-12-27 DIAGNOSIS — R972 Elevated prostate specific antigen [PSA]: Secondary | ICD-10-CM | POA: Insufficient documentation

## 2017-01-21 ENCOUNTER — Ambulatory Visit: Payer: Self-pay | Admitting: Family Medicine

## 2017-02-05 ENCOUNTER — Encounter (HOSPITAL_COMMUNITY): Payer: Self-pay | Admitting: Emergency Medicine

## 2017-02-05 ENCOUNTER — Emergency Department (HOSPITAL_COMMUNITY)
Admission: EM | Admit: 2017-02-05 | Discharge: 2017-02-05 | Disposition: A | Discharge status: Home / Self Care | Payer: Medicare HMO | Attending: Emergency Medicine | Admitting: Emergency Medicine

## 2017-02-05 ENCOUNTER — Emergency Department (HOSPITAL_COMMUNITY): Payer: Medicare HMO

## 2017-02-05 DIAGNOSIS — M7989 Other specified soft tissue disorders: Secondary | ICD-10-CM | POA: Diagnosis not present

## 2017-02-05 DIAGNOSIS — I130 Hypertensive heart and chronic kidney disease with heart failure and stage 1 through stage 4 chronic kidney disease, or unspecified chronic kidney disease: Secondary | ICD-10-CM | POA: Insufficient documentation

## 2017-02-05 DIAGNOSIS — S99912A Unspecified injury of left ankle, initial encounter: Secondary | ICD-10-CM | POA: Diagnosis present

## 2017-02-05 DIAGNOSIS — Y92480 Sidewalk as the place of occurrence of the external cause: Secondary | ICD-10-CM | POA: Insufficient documentation

## 2017-02-05 DIAGNOSIS — S93402A Sprain of unspecified ligament of left ankle, initial encounter: Secondary | ICD-10-CM | POA: Diagnosis not present

## 2017-02-05 DIAGNOSIS — Z79899 Other long term (current) drug therapy: Secondary | ICD-10-CM | POA: Diagnosis not present

## 2017-02-05 DIAGNOSIS — M25572 Pain in left ankle and joints of left foot: Secondary | ICD-10-CM

## 2017-02-05 DIAGNOSIS — Z96651 Presence of right artificial knee joint: Secondary | ICD-10-CM | POA: Diagnosis not present

## 2017-02-05 DIAGNOSIS — Y998 Other external cause status: Secondary | ICD-10-CM | POA: Diagnosis not present

## 2017-02-05 DIAGNOSIS — X509XXA Other and unspecified overexertion or strenuous movements or postures, initial encounter: Secondary | ICD-10-CM | POA: Diagnosis not present

## 2017-02-05 DIAGNOSIS — Z7982 Long term (current) use of aspirin: Secondary | ICD-10-CM | POA: Insufficient documentation

## 2017-02-05 DIAGNOSIS — I5032 Chronic diastolic (congestive) heart failure: Secondary | ICD-10-CM | POA: Insufficient documentation

## 2017-02-05 DIAGNOSIS — Z96652 Presence of left artificial knee joint: Secondary | ICD-10-CM | POA: Insufficient documentation

## 2017-02-05 DIAGNOSIS — Y9301 Activity, walking, marching and hiking: Secondary | ICD-10-CM | POA: Diagnosis not present

## 2017-02-05 DIAGNOSIS — N183 Chronic kidney disease, stage 3 (moderate): Secondary | ICD-10-CM | POA: Insufficient documentation

## 2017-02-05 DIAGNOSIS — M79672 Pain in left foot: Secondary | ICD-10-CM | POA: Diagnosis not present

## 2017-02-05 DIAGNOSIS — F17228 Nicotine dependence, chewing tobacco, with other nicotine-induced disorders: Secondary | ICD-10-CM | POA: Insufficient documentation

## 2017-02-05 DIAGNOSIS — R69 Illness, unspecified: Secondary | ICD-10-CM | POA: Diagnosis not present

## 2017-02-05 MED ORDER — MELOXICAM 7.5 MG PO TABS
7.5000 mg | ORAL_TABLET | Freq: Once | ORAL | Status: AC
  Administered 2017-02-05: 7.5 mg via ORAL
  Filled 2017-02-05: qty 1

## 2017-02-05 MED ORDER — MELOXICAM 7.5 MG PO TABS: 7.5000 mg | ORAL_TABLET | Freq: Every day | ORAL | 0 refills | Status: DC

## 2017-02-05 NOTE — ED Triage Notes (Signed)
Pain and swelling to left foot/heel area that started yesterday.  Pt is now using a stick to walk and can't walk down stairs.

## 2017-02-05 NOTE — Discharge Instructions (Signed)
Follow the RICE (Rest, Ice, Compression, Elevation) protocol as directed. Wear the splint for support and stabilization.  Use the crutches for the next few days. After that, started to increase weightbearing as tolerated.  As we discussed, if her symptoms do not improve in the next week, follow-up with referred orthopedic doctor.  Return the emergency Department for any worsening pain, numbness/weakness of the foot, discoloration of the foot, redness of the foot, warmth of the foot, fevers or any other worsening or concerning symptoms.

## 2017-02-05 NOTE — Progress Notes (Signed)
Orthopedic Tech Progress Note Patient Details:  Nathan Grant [birth year] [id number]  Ortho Devices Ortho Device/Splint Location: Bariatric cruches Ortho Device/Splint Interventions: Application, Adjustment   [place] Pink 02/05/2017, 10:34 PM

## 2017-02-05 NOTE — ED Provider Notes (Signed)
[facility] EMERGENCY DEPARTMENT Provider Note   CSN: [id number] Arrival date & time: 02/05/17  1909     History   Chief Complaint Chief Complaint  Patient presents with  . Foot Pain    HPI Nathan Grant is a 71 y.o. male with PMH/o HTN, OA, CKD who presents with 2 days of left ankle pain and swelling. Patient does not recall a trauma or fall but does report that the day prior to onset of symptoms, he may have stepped down wrong on a curb. Patient reports that symptoms worsened this morning. He reports having to use a cane to ambulate and bear weight secondary to pain, which is not his baseline. Patient denies any warmth or erythema to the foot. He has not taken any medications for the pain. Patient reports that he always has bilateral lower extremity edema secondary to diagnosis of enlarged heart. He states that he is on a fluid pill and states he has been compliant. He has not noticed any changes in the bilateral extremity edema. Patient states that he does not have a history of gout. Patient denies any fever, warmth, erythema, numbness/weakness.  The history is provided by the patient.    Past Medical History:  Diagnosis Date  . Atrial flutter ([place])    s/p cardioversion 06/2010  . Bradycardia    a. Accelerated junctional & sinus bradycardia during 01/2013 adm.  . Chronic headaches   . Degenerative joint disease     End-stage degenerative joint disease, left knee  . Erectile dysfunction   . HTN (hypertension)   . Hypertensive heart disease    a. Admit for CP 01/2013 felt due to this. Cath with normal coronary anatomy.  . Microcytic anemia  03/22/2001  . Morbid obesity ([place])   . NSVT (nonsustained ventricular tachycardia) ([place])    a. During 01/2013 adm.  . Obesity   . Osteoarthritis   . Tendonitis   . Tobacco chew use     History of chewing tobacco usage.   . Urinary tract infection     Pseudomonas urinary tract infection    Patient Active Problem  List   Diagnosis Date Noted  . Chronic diastolic CHF (congestive heart failure) ([place]) 09/06/2014  . CKD (chronic kidney disease) stage 3, GFR 30-59 ml/min ([facility]) 09/06/2014  . Hypercholesterolemia 12/21/2013  . HTN (hypertension) 01/17/2013  . NSVT (nonsustained ventricular tachycardia) ([place]) 01/17/2013  . Accelerated junctional rhythm 01/17/2013  . Sinus bradycardia 01/17/2013  . Diastolic dysfunction 19/14/7829  . Acute on chronic renal failure ([place]) 05/30/2012  . Osteoarthritis   . Hypokalemia   . Erectile dysfunction   . Chronic headaches   . Hyperlipidemia   . Morbid obesity ([place])   . Atrial flutter ([place])   . Hypertensive heart disease   . Microcytic anemia 03/22/2001    Past Surgical History:  Procedure Laterality Date  . CARDIOVERSION  07/01/2010  . LEFT HEART CATHETERIZATION WITH CORONARY ANGIOGRAM N/A 01/16/2013   Procedure: LEFT HEART CATHETERIZATION WITH CORONARY ANGIOGRAM;  Surgeon: Peter M Martinique, MD;  Location: [facility] CATH LAB;  Service: Cardiovascular;  Laterality: N/A;  . TOTAL KNEE ARTHROPLASTY  May 2009   Bilateral  . VASECTOMY         Home Medications    Prior to Admission medications   Medication Sig Start Date End Date Taking? Authorizing Provider  amLODipine (NORVASC) 10 MG tablet Take 1 tablet (10 mg total) by mouth daily. 06/13/13   Burtis Junes, NP  APPLE CIDER  VINEGAR PO Take 5 mLs by mouth 2 (two) times daily.     [provider]  aspirin EC 81 MG tablet Take 81 mg by mouth daily.    [provider]  hydrochlorothiazide (HYDRODIURIL) 25 MG tablet Take 1 tablet (25 mg total) by mouth daily. 06/13/13   Burtis Junes, NP  meloxicam (MOBIC) 7.5 MG tablet Take 1 tablet (7.5 mg total) by mouth daily. 02/05/17   Volanda Napoleon, PA-C  metoprolol succinate (TOPROL-XL) 25 MG 24 hr tablet Take 1 tablet (25 mg total) by mouth daily. 12/09/13   Fredia Sorrow, MD  Multiple Vitamins-Minerals (CENTRUM SILVER PO) Take 1 tablet by mouth daily.     [provider]  potassium chloride SA (K-DUR,KLOR-CON) 20 MEQ tablet Take 20 mEq by mouth 3 (three) times daily.    [provider]  pravastatin (PRAVACHOL) 40 MG tablet Take 1 tablet (40 mg total) by mouth every evening. 12/21/13   Martinique, Peter M, MD  tadalafil (CIALIS) 20 MG tablet Take 1 tablet by mouth as needed. 01/09/13   [provider]    Family History Family History  Problem Relation Age of Onset  . Alcohol abuse Brother   . Hypertension Brother   . Hypertension Mother     Social History Social History  Substance Use Topics  . Smoking status: Never Smoker  . Smokeless tobacco: Current User    Types: Chew  . Alcohol use No     Allergies   Patient has no known allergies.   Review of Systems Review of Systems  Constitutional: Negative for fever.  Gastrointestinal: Negative for nausea.  Musculoskeletal:       Foot pain  Skin: Negative for color change.  Neurological: Negative for weakness and numbness.     Physical Exam Updated Vital Signs BP (!) 192/71   Pulse 71   Temp 98.7 F (37.1 C) (Oral)   Resp 17   Ht 6\' 1"  (1.854 m)   Wt (!) 142.9 kg (315 lb)   SpO2 97%   BMI 41.56 kg/m   Physical Exam  Constitutional: He appears well-developed and well-nourished.  HENT:  Head: Normocephalic and atraumatic.  Eyes: Conjunctivae and EOM are normal. Right eye exhibits no discharge. Left eye exhibits no discharge. No scleral icterus.  Cardiovascular:  Pulses:      Dorsalis pedis pulses are 2+ on the right side, and 2+ on the left side.  Pulmonary/Chest: Effort normal.  Musculoskeletal:  Tenderness palpating the medial malleolus of the left ankle. With overlying soft tissue swelling. There is some chronic venous stasis changes in the medial aspect of the left ankle. No deformity or crepitus noted. No overlying warmth, erythema. Dorsiflexion and plantar flexion of left ankle intact. Patient able to slightly wiggle toes of the left  foot. No abnormality to the right ankle. No calf tenderness. Mild nonpitting edema noted to bilateral lower extremity. They're symmetric in appearance. No overlying warmth, erythema.  Neurological: He is alert.  Sensation intact along major nerve distributions of bilateral feet.  Skin: Skin is warm and dry. Capillary refill takes less than 2 seconds.  Left lower extremity is not dusky in appearance or cool to touch. No warmth or erythema noted to the foot.  Psychiatric: He has a normal mood and affect. His speech is normal and behavior is normal.  Nursing note and vitals reviewed.    ED Treatments / Results  Labs (all labs ordered are listed, but only abnormal results are displayed) Labs  Reviewed - No data to display  EKG  EKG Interpretation None       Radiology Dg Foot Complete Left  Result Date: 02/05/2017 CLINICAL DATA:  Swelling and pain at the arch of the foot EXAM: LEFT FOOT - COMPLETE 3+ VIEW COMPARISON:  None. FINDINGS: No fracture or malalignment. Small moderate plantar calcaneal spur. Mild degenerative changes at the first MTP joint IMPRESSION: 1. No acute osseous abnormality 2. Diffuse soft tissue swelling. 3. Moderate plantar calcaneal spur Electronically Signed   By: Donavan Foil M.D.   On: 02/05/2017 20:15    Procedures Procedures (including critical care time)  Medications Ordered in ED Medications  meloxicam (MOBIC) tablet 7.5 mg (not administered)     Initial Impression / Assessment and Plan / ED Course  I have reviewed the triage vital signs and the nursing notes.  Pertinent labs & imaging results that were available during my care of the patient were reviewed by me and considered in my medical decision making (see chart for details).     71 year old male who presents with 2 days of left ankle pain. Does not recall trauma, injury, fall but does report stepping wrong on a curb. Patient is afebrile, non-toxic appearing, sitting comfortably on examination  table. Vital signs reviewed. Patient hypertensive. Likely secondary to pain. Will reassess. Patient is neurovascularly intact.  Consider ankle sprain vs fracture vs dislocation. X-rays ordered at triage. History/physical exam are not concerning for DVT, septic arthritis, gout.  X-ray reviewed. Negative for any acute fracture dislocation. Discussed results with patient. We'll plan to treat as an ankle sprain. We'll plan to provide splint and crutches for support and stabilization. Plan to provide outpatient orthopedic referral for further follow-up and evaluation. Strict return precautions discussed. Patient expresses understanding and agreement to plan.    Final Clinical Impressions(s) / ED Diagnoses   Final diagnoses:  Sprain of left ankle, unspecified ligament, initial encounter  Acute left ankle pain    New Prescriptions New Prescriptions   MELOXICAM (MOBIC) 7.5 MG TABLET    Take 1 tablet (7.5 mg total) by mouth daily.     Volanda Napoleon, PA-C 02/06/17 [place] Seeds, MD 02/06/17 [phone]

## 2017-03-18 ENCOUNTER — Other Ambulatory Visit: Payer: Self-pay

## 2017-03-18 ENCOUNTER — Encounter: Payer: Self-pay | Admitting: Physician Assistant

## 2017-03-18 ENCOUNTER — Ambulatory Visit (INDEPENDENT_AMBULATORY_CARE_PROVIDER_SITE_OTHER): Payer: Medicare HMO | Admitting: Physician Assistant

## 2017-03-18 VITALS — BP 160/96 | HR 69 | Temp 98.4°F | Resp 18 | Ht 72.64 in | Wt 325.2 lb

## 2017-03-18 DIAGNOSIS — Z13220 Encounter for screening for lipoid disorders: Secondary | ICD-10-CM

## 2017-03-18 DIAGNOSIS — Z131 Encounter for screening for diabetes mellitus: Secondary | ICD-10-CM

## 2017-03-18 DIAGNOSIS — I5032 Chronic diastolic (congestive) heart failure: Secondary | ICD-10-CM

## 2017-03-18 DIAGNOSIS — D649 Anemia, unspecified: Secondary | ICD-10-CM

## 2017-03-18 MED ORDER — PRAVASTATIN SODIUM 40 MG PO TABS: 40.0000 mg | ORAL_TABLET | Freq: Every evening | ORAL | 1 refills | Status: DC

## 2017-03-18 MED ORDER — ASPIRIN EC 81 MG PO TBEC: 81.0000 mg | DELAYED_RELEASE_TABLET | Freq: Every day | ORAL | 1 refills | Status: DC

## 2017-03-18 MED ORDER — HYDROCHLOROTHIAZIDE 25 MG PO TABS: 25.0000 mg | ORAL_TABLET | Freq: Every day | ORAL | 1 refills | Status: DC

## 2017-03-18 MED ORDER — AMLODIPINE BESYLATE 10 MG PO TABS: 10.0000 mg | ORAL_TABLET | Freq: Every day | ORAL | 1 refills | Status: DC

## 2017-03-18 MED ORDER — METOPROLOL SUCCINATE ER 25 MG PO TB24: 25.0000 mg | ORAL_TABLET | Freq: Every day | ORAL | 1 refills | Status: DC

## 2017-03-18 NOTE — Patient Instructions (Addendum)
Please do not take any Ibuprofen, Motrin, Goodies, Aleve.      IF you received an x-ray today, you will receive an invoice from [facility]. Please contact [facility] at [phone] with questions or concerns regarding your invoice.   IF you received labwork today, you will receive an invoice from [place]. Please contact [facility] at [phone] with questions or concerns regarding your invoice.   Our billing staff will not be able to assist you with questions regarding bills from these companies.  You will be contacted with the lab results as soon as they are available. The fastest way to get your results is to activate your My Chart account. Instructions are located on the last page of this paperwork. If you have not heard from [place] regarding the results in 2 weeks, please contact this office.

## 2017-03-18 NOTE — Progress Notes (Signed)
03/18/2017 10:09 AM   DOB: 04/12/1946 / MRN: [id number]  SUBJECTIVE:  Nathan Grant is a 71 y.o. male presenting to establish care.  Would like refills of his medication today. Has a history of CHF and was followed by cards but has been somewhat loss to follow up. Tells me that he walks 2 miles every other day and does not need to take break. He does not miss doses of medication. He feels well today and denies symptoms.   He does not drink any sugar.    He has No Known Allergies.   He  has a past medical history of Atrial flutter ([place]), Bradycardia, Chronic headaches, Degenerative joint disease, Erectile dysfunction, HTN (hypertension), Hypertensive heart disease, Microcytic anemia ( 03/22/2001), Morbid obesity ([place]), NSVT (nonsustained ventricular tachycardia) ([place]), Obesity, Osteoarthritis, Tendonitis, Tobacco chew use, and Urinary tract infection.    He  reports that  has never smoked. His smokeless tobacco use includes chew. He reports that he does not drink alcohol or use drugs. He  reports that he currently engages in sexual activity. The patient  has a past surgical history that includes Total knee arthroplasty (May 2009); Cardioversion (07/01/2010); Vasectomy; and left heart catheterization with coronary angiogram (N/A, 01/16/2013).  His family history includes Alcohol abuse in his brother; Hypertension in his brother and mother.  Review of Systems  Constitutional: Negative for chills and fever.  HENT: Negative for sore throat.   Respiratory: Negative for cough.   Cardiovascular: Negative for chest pain.  Skin: Negative for itching and rash.  Neurological: Negative for dizziness.    The problem list and medications were reviewed and updated by myself where necessary and exist elsewhere in the encounter.   OBJECTIVE:  BP (!) 160/96 (BP Location: Right Arm, Patient Position: Sitting, Cuff Size: Large)   Pulse 69   Temp 98.4 F (36.9 C) (Oral)   Resp 18   Ht 6' 0.64" (1.845  m)   Wt (!) 325 lb 3.2 oz (147.5 kg)   SpO2 98%   BMI 43.33 kg/m   Physical Exam  Constitutional: He appears well-developed. He is active and cooperative.  Non-toxic appearance.  Cardiovascular: Normal rate, regular rhythm, S1 normal, S2 normal, normal heart sounds, intact distal pulses and normal pulses. Exam reveals no gallop and no friction rub.  No murmur heard. Pulmonary/Chest: Effort normal. No stridor. No tachypnea. No respiratory distress. He has no wheezes. He has no rales.  Abdominal: He exhibits no distension.  Musculoskeletal: He exhibits edema. He exhibits no tenderness or deformity.  Neurological: He is alert.  Skin: Skin is warm and dry. He is not diaphoretic. No pallor.  Vitals reviewed.   No results found for this or any previous visit (from the past 72 hour(s)).  No results found.  ASSESSMENT AND PLAN:  Nathan Grant was seen today for establish care.  Diagnoses and all orders for this visit:  Chronic diastolic CHF (congestive heart failure) [facility]): Patient needs to get back to his cardiologist.  I have refilled his medications for now and would like to see him back in about 6 months.  -     metoprolol succinate (TOPROL-XL) 25 MG 24 hr tablet; Take 1 tablet (25 mg total) by mouth daily. -     amLODipine (NORVASC) 10 MG tablet; Take 1 tablet (10 mg total) by mouth daily. -     aspirin EC 81 MG tablet; Take 1 tablet (81 mg total) by mouth daily. -     hydrochlorothiazide (HYDRODIURIL) 25  MG tablet; Take 1 tablet (25 mg total) by mouth daily. -     CBC with Differential/Platelet -     CMP and Liver -     Brain natriuretic peptide -     Ambulatory referral to Cardiology  Screening for diabetes mellitus -     Hemoglobin A1c -     Lipid Panel  Screening for lipid disorders -     pravastatin (PRAVACHOL) 40 MG tablet; Take 1 tablet (40 mg total) by mouth every evening.    The patient is advised to call or return to clinic if he does not see an improvement in  symptoms, or to seek the care of the closest emergency department if he worsens with the above plan.   Philis Fendt, MHS, PA-C [facility] 03/18/2017 10:09 AM

## 2017-03-19 LAB — CBC WITH DIFFERENTIAL/PLATELET
BASOS ABS: 0 10*3/uL (ref 0.0–0.2)
Basos: 0 %
EOS (ABSOLUTE): 0.2 10*3/uL (ref 0.0–0.4)
Eos: 4 %
Hematocrit: 35.6 % — ABNORMAL LOW (ref 37.5–51.0)
Hemoglobin: 11.1 g/dL — ABNORMAL LOW (ref 13.0–17.7)
IMMATURE GRANS (ABS): 0 10*3/uL (ref 0.0–0.1)
IMMATURE GRANULOCYTES: 0 %
LYMPHS: 26 %
Lymphocytes Absolute: 1.4 10*3/uL (ref 0.7–3.1)
MCH: 22 pg — AB (ref 26.6–33.0)
MCHC: 31.2 g/dL — ABNORMAL LOW (ref 31.5–35.7)
MCV: 71 fL — AB (ref 79–97)
Monocytes Absolute: 0.5 10*3/uL (ref 0.1–0.9)
Monocytes: 9 %
NEUTROS ABS: 3.3 10*3/uL (ref 1.4–7.0)
Neutrophils: 61 %
PLATELETS: 335 10*3/uL (ref 150–379)
RBC: 5.05 x10E6/uL (ref 4.14–5.80)
RDW: 16.2 % — AB (ref 12.3–15.4)
WBC: 5.5 10*3/uL (ref 3.4–10.8)

## 2017-03-19 LAB — CMP AND LIVER
ALBUMIN: 4.3 g/dL (ref 3.5–4.8)
ALK PHOS: 50 IU/L (ref 39–117)
ALT: 18 IU/L (ref 0–44)
AST: 25 IU/L (ref 0–40)
BILIRUBIN, DIRECT: 0.12 mg/dL (ref 0.00–0.40)
BUN: 22 mg/dL (ref 8–27)
Bilirubin Total: 0.4 mg/dL (ref 0.0–1.2)
CO2: 32 mmol/L — AB (ref 20–29)
CREATININE: 1.68 mg/dL — AB (ref 0.76–1.27)
Calcium: 9.3 mg/dL (ref 8.6–10.2)
Chloride: 97 mmol/L (ref 96–106)
GFR calc Af Amer: 46 mL/min/{1.73_m2} — ABNORMAL LOW (ref >59)
GFR calc non Af Amer: 40 mL/min/{1.73_m2} — ABNORMAL LOW (ref >59)
Glucose: 98 mg/dL (ref 65–99)
Potassium: 3.4 mmol/L — ABNORMAL LOW (ref 3.5–5.2)
Sodium: 141 mmol/L (ref 134–144)
Total Protein: 6.7 g/dL (ref 6.0–8.5)

## 2017-03-19 LAB — LIPID PANEL
CHOL/HDL RATIO: 3 ratio (ref 0.0–5.0)
Cholesterol, Total: 132 mg/dL (ref 100–199)
HDL: 44 mg/dL (ref >39)
LDL Calculated: 66 mg/dL (ref 0–99)
Triglycerides: 110 mg/dL (ref 0–149)
VLDL Cholesterol Cal: 22 mg/dL (ref 5–40)

## 2017-03-19 LAB — HEMOGLOBIN A1C
Est. average glucose Bld gHb Est-mCnc: 140 mg/dL
HEMOGLOBIN A1C: 6.5 % — AB (ref 4.8–5.6)

## 2017-03-19 LAB — BRAIN NATRIURETIC PEPTIDE: BNP: 71.1 pg/mL (ref 0.0–100.0)

## 2017-03-19 NOTE — Progress Notes (Signed)
Please add on iron panel. I'd like a repeat renal panel in about 1 week and he should be very well hydrated.  Sherese please call patient and ensure that he has a 3 month follow up with me in place. He is a very early diabetic and please advise that if there is anything he can do in his diet to eat less cards/sugar, this would pay off in health benefits in the future. If his numbers are not improving in three months we'd need to start him on diabetes medication. Philis Fendt, MS, PA-C 9:27 AM, 03/19/2017

## 2017-03-26 ENCOUNTER — Telehealth: Payer: Self-pay | Admitting: Physician Assistant

## 2017-03-26 DIAGNOSIS — D649 Anemia, unspecified: Secondary | ICD-10-CM | POA: Diagnosis not present

## 2017-03-26 NOTE — Telephone Encounter (Signed)
Called patient to verify which medications he actually takes.  Per patient he does not take HYDROCHLOROTHIAZIDE. / Per patient he takes CHLORTHALIDONE / Patient is requesting to take the CHLORTHALIDONE as he states he body is already used to this /

## 2017-03-26 NOTE — Telephone Encounter (Signed)
Copied from [place]. Topic: Quick Communication - Rx Refill/Question >> Mar 26, 2017 10:33 AM Robina Ade, Helene Kelp D wrote: Has the patient contacted their pharmacy? No (Agent: If no, request that the patient contact the pharmacy for the refill.) Preferred Pharmacy (with phone number or street name): [facility] [address]: Please be advised that RX refills may take up to 3 business days. We ask that you follow-up with your pharmacy. Patient called and said that the medication hydrochlorothiazide (HYDRODIURIL) 25 MG tablet is not the correct medication he has been taking. He takes Hydrocortisone 50 mg. He ask for someone to call him back when it is sent.

## 2017-03-26 NOTE — Addendum Note (Signed)
Addended by: Gari Crown D on: 03/26/2017 02:54 PM   Modules accepted: Orders

## 2017-03-27 LAB — IRON,TIBC AND FERRITIN PANEL
FERRITIN: 418 ng/mL — AB (ref 30–400)
Iron Saturation: 27 % (ref 15–55)
Iron: 69 ug/dL (ref 38–169)
Total Iron Binding Capacity: 251 ug/dL (ref 250–450)
UIBC: 182 ug/dL (ref 111–343)

## 2017-03-30 ENCOUNTER — Other Ambulatory Visit: Payer: Self-pay | Admitting: Physician Assistant

## 2017-03-30 MED ORDER — CHLORTHALIDONE 50 MG PO TABS: 50.0000 mg | ORAL_TABLET | Freq: Every day | ORAL | 1 refills | Status: DC

## 2017-03-30 MED ORDER — POTASSIUM CHLORIDE CRYS ER 20 MEQ PO TBCR: 20.0000 meq | EXTENDED_RELEASE_TABLET | Freq: Three times a day (TID) | ORAL | 1 refills | Status: DC

## 2017-03-30 NOTE — Telephone Encounter (Signed)
I've spoke with him and the issue is resolved. Philis Fendt, MS, PA-C 4:18 PM, 03/30/2017

## 2017-03-30 NOTE — Telephone Encounter (Signed)
Please advise 

## 2017-03-31 ENCOUNTER — Telehealth: Payer: Self-pay

## 2017-03-31 NOTE — Telephone Encounter (Signed)
error 

## 2017-07-07 ENCOUNTER — Ambulatory Visit: Payer: Medicare HMO | Admitting: Physician Assistant

## 2017-07-14 ENCOUNTER — Encounter: Payer: Self-pay | Admitting: Physician Assistant

## 2017-07-14 ENCOUNTER — Other Ambulatory Visit: Payer: Self-pay

## 2017-07-14 ENCOUNTER — Ambulatory Visit (INDEPENDENT_AMBULATORY_CARE_PROVIDER_SITE_OTHER): Payer: Medicare HMO | Admitting: Physician Assistant

## 2017-07-14 VITALS — BP 132/80 | HR 59 | Temp 98.3°F | Resp 18 | Ht 72.24 in | Wt 325.2 lb

## 2017-07-14 DIAGNOSIS — R7309 Other abnormal glucose: Secondary | ICD-10-CM | POA: Diagnosis not present

## 2017-07-14 DIAGNOSIS — R3915 Urgency of urination: Secondary | ICD-10-CM

## 2017-07-14 DIAGNOSIS — D229 Melanocytic nevi, unspecified: Secondary | ICD-10-CM | POA: Diagnosis not present

## 2017-07-14 DIAGNOSIS — B078 Other viral warts: Secondary | ICD-10-CM | POA: Diagnosis not present

## 2017-07-14 DIAGNOSIS — I509 Heart failure, unspecified: Secondary | ICD-10-CM | POA: Diagnosis not present

## 2017-07-14 DIAGNOSIS — L918 Other hypertrophic disorders of the skin: Secondary | ICD-10-CM | POA: Diagnosis not present

## 2017-07-14 LAB — POCT GLYCOSYLATED HEMOGLOBIN (HGB A1C): Hemoglobin A1C: 6.1

## 2017-07-14 NOTE — Patient Instructions (Signed)
     IF you received an x-ray today, you will receive an invoice from [facility]. Please contact [facility] at [phone] with questions or concerns regarding your invoice.   IF you received labwork today, you will receive an invoice from [facility]. Please contact [facility] at [phone] with questions or concerns regarding your invoice.   Our billing staff will not be able to assist you with questions regarding bills from these companies.  You will be contacted with the lab results as soon as they are available. The fastest way to get your results is to activate your My Chart account. Instructions are located on the last page of this paperwork. If you have not heard from us regarding the results in 2 weeks, please contact this office.     

## 2017-07-14 NOTE — Progress Notes (Signed)
07/14/2017 11:06 AM   DOB: 19-Jul-1945 / MRN: [id number]  SUBJECTIVE:  Nathan Grant is a 72 y.o. male presenting for recheck A1c.  He is taking medications without missing doses. Complains of a mole on his face pictured below that has been changing. Feels well and denies complaint.  He has No Known Allergies.   He  has a past medical history of Atrial flutter ([place]), Bradycardia, Chronic headaches, Degenerative joint disease, Erectile dysfunction, HTN (hypertension), Hypertensive heart disease, Microcytic anemia ( 03/22/2001), Morbid obesity ([place]), NSVT (nonsustained ventricular tachycardia) ([place]), Obesity, Osteoarthritis, Tendonitis, Tobacco chew use, and Urinary tract infection.    He  reports that he has never smoked. His smokeless tobacco use includes chew. He reports that he does not drink alcohol or use drugs. He  reports that he currently engages in sexual activity. The patient  has a past surgical history that includes Total knee arthroplasty (May 2009); Cardioversion (07/01/2010); Vasectomy; and left heart catheterization with coronary angiogram (N/A, 01/16/2013).  His family history includes Alcohol abuse in his brother; Hypertension in his brother and mother.  Review of Systems  Constitutional: Negative for chills, diaphoresis and fever.  Eyes: Negative.   Respiratory: Negative for cough, hemoptysis, sputum production, shortness of breath and wheezing.   Cardiovascular: Negative for chest pain, orthopnea and leg swelling.  Gastrointestinal: Negative for abdominal pain, blood in stool, constipation, diarrhea, heartburn, melena, nausea and vomiting.  Genitourinary: Negative for flank pain.  Skin: Negative for rash.  Neurological: Negative for dizziness, sensory change, speech change, focal weakness and headaches.    The problem list and medications were reviewed and updated by myself where necessary and exist elsewhere in the encounter.   OBJECTIVE:  BP 132/80 (BP Location:  Left Arm, Patient Position: Sitting, Cuff Size: Large)   Pulse (!) 59   Temp 98.3 F (36.8 C) (Oral)   Resp 18   Ht 6' 0.24" (1.835 m)   Wt (!) 325 lb 3.2 oz (147.5 kg)   SpO2 99%   BMI 43.81 kg/m   Wt Readings from Last 3 Encounters:  07/14/17 (!) 325 lb 3.2 oz (147.5 kg)  03/18/17 (!) 325 lb 3.2 oz (147.5 kg)  02/05/17 (!) 315 lb (142.9 kg)     Physical Exam  Constitutional: He appears well-developed. He is active and cooperative.  Non-toxic appearance.  Cardiovascular: Normal rate.  Pulmonary/Chest: Effort normal. No tachypnea.  Neurological: He is alert.  Skin: Skin is warm and dry. He is not diaphoretic. No pallor.  Vitals reviewed.   Results for orders placed or performed in visit on 07/14/17 (from the past 72 hour(s))  POCT glycosylated hemoglobin (Hb A1C)     Status: None   Collection Time: 07/14/17 10:13 AM  Result Value Ref Range   Hemoglobin A1C 6.1      Procedure: Risk and benefits discussed and verbal consent obtained. The patient was anesthetized using 5 cc of 1:1 mix of 1% lidocaine with epi and Marcaine. Facial lesion shaved. Hemostasis achieved with silver nitrate x 2. Patient tolerated the procedure without complaint.      No results found.  ASSESSMENT AND PLAN:  Zhaire was seen today for congestive heart failure and follow-up.  Diagnoses and all orders for this visit:  Elevated hemoglobin A1c measurement Comments: Controlled.  Orders: -     POCT glycosylated hemoglobin (Hb A1C) -     Renal Function Panel  Chronic congestive heart failure, unspecified heart failure type ([place]) Comments: Controlled.   Urinary urgency: Secondary  to diuresis.   Comments: Controlled.   Nevus Comments: Removed.  See path.  Orders: -     Dermatology pathology    The patient is advised to call or return to clinic if he does not see an improvement in symptoms, or to seek the care of the closest emergency department if he worsens with the above plan.    Philis Fendt, MHS, PA-C [facility] 07/14/2017 11:06 AM

## 2017-07-15 LAB — RENAL FUNCTION PANEL
Albumin: 4.3 g/dL (ref 3.5–4.8)
BUN / CREAT RATIO: 15 (ref 10–24)
BUN: 24 mg/dL (ref 8–27)
CO2: 29 mmol/L (ref 20–29)
Calcium: 9.1 mg/dL (ref 8.6–10.2)
Chloride: 98 mmol/L (ref 96–106)
Creatinine, Ser: 1.57 mg/dL — ABNORMAL HIGH (ref 0.76–1.27)
GFR calc Af Amer: 50 mL/min/{1.73_m2} — ABNORMAL LOW (ref >59)
GFR, EST NON AFRICAN AMERICAN: 43 mL/min/{1.73_m2} — AB (ref >59)
Glucose: 107 mg/dL — ABNORMAL HIGH (ref 65–99)
Phosphorus: 2.8 mg/dL (ref 2.5–4.5)
Potassium: 3.4 mmol/L — ABNORMAL LOW (ref 3.5–5.2)
Sodium: 144 mmol/L (ref 134–144)

## 2017-09-22 ENCOUNTER — Other Ambulatory Visit: Payer: Self-pay | Admitting: Physician Assistant

## 2017-09-22 DIAGNOSIS — I5032 Chronic diastolic (congestive) heart failure: Secondary | ICD-10-CM

## 2017-09-23 ENCOUNTER — Telehealth: Payer: Self-pay | Admitting: General Practice

## 2017-09-23 DIAGNOSIS — Z13220 Encounter for screening for lipoid disorders: Secondary | ICD-10-CM

## 2017-09-23 DIAGNOSIS — I5032 Chronic diastolic (congestive) heart failure: Secondary | ICD-10-CM

## 2017-09-23 MED ORDER — PRAVASTATIN SODIUM 40 MG PO TABS: 40.0000 mg | ORAL_TABLET | Freq: Every evening | ORAL | 1 refills | Status: DC

## 2017-09-23 MED ORDER — AMLODIPINE BESYLATE 10 MG PO TABS: 10.0000 mg | ORAL_TABLET | Freq: Every day | ORAL | 1 refills | Status: DC

## 2017-09-23 NOTE — Telephone Encounter (Signed)
Metoprolol refill Last Refill:03/18/17 # 90 1 RF Last OV: 07/14/17 PCP: Philis Fendt PA Pharmacy:[facility] [phone]  Chlorthalidone refill Last Refill:03/30/17  # 90 1 RF Last OV: 07/14/17 PCP: Philis Fendt PA  [facility]acy:[facility] [phone] [address]

## 2017-09-23 NOTE — Telephone Encounter (Signed)
Copied from [place] [phone]. Topic: Quick Communication - Rx Refill/Question >> Sep 23, 2017 11:08 AM Mcneil, Ja-Kwan wrote: Medication: pravastatin (PRAVACHOL) 40 MG tablet,  amLODipine (NORVASC) 10 MG tablet  Has the patient contacted their pharmacy? yes   Preferred Pharmacy (with phone number or street name): [place], [place]. [phone] (Phone) [phone] (Fax)    Agent: Please be advised that RX refills may take up to 3 business days. We ask that you follow-up with your pharmacy.

## 2017-09-28 ENCOUNTER — Encounter: Payer: Self-pay | Admitting: Physician Assistant

## 2017-09-28 ENCOUNTER — Ambulatory Visit (INDEPENDENT_AMBULATORY_CARE_PROVIDER_SITE_OTHER): Payer: Medicare HMO | Admitting: Physician Assistant

## 2017-09-28 ENCOUNTER — Other Ambulatory Visit: Payer: Self-pay

## 2017-09-28 VITALS — BP 160/78 | HR 56 | Temp 98.8°F | Ht 73.0 in | Wt 324.0 lb

## 2017-09-28 DIAGNOSIS — M545 Low back pain, unspecified: Secondary | ICD-10-CM

## 2017-09-28 NOTE — Progress Notes (Signed)
Nathan Grant  MRN: [id number] DOB: 02/20/1946  PCP: Patient, No Pcp Per  Subjective:  Pt is a 72 year old male who presents to clinic for b/l knee pain.  He helped his brother change a tire two weeks ago. The next morninghe had pain of lower back, right buttock, both thighs.  Pain is worse when he bends over or stands from a seated position.  His pain has improved over the past 2 weeks.  He has taken nothing to feel better.  Denies numbness, tingling, muscle weakness, saddle paresthesia, loss of bowel or bladder function  He has a h/o b/l knee replacement.   Review of Systems  Gastrointestinal: Negative for abdominal pain, constipation, diarrhea, nausea and vomiting.  Musculoskeletal: Positive for arthralgias (b/l knees) and back pain (lower right). Negative for gait problem, myalgias, neck pain and neck stiffness.  Skin: Negative.   Neurological: Negative for weakness and numbness.    Patient Active Problem List   Diagnosis Date Noted  . Chronic diastolic CHF (congestive heart failure) ([place]) 09/06/2014  . CKD (chronic kidney disease) stage 3, GFR 30-59 ml/min ([facility]) 09/06/2014  . Hypercholesterolemia 12/21/2013  . HTN (hypertension) 01/17/2013  . NSVT (nonsustained ventricular tachycardia) ([place]) 01/17/2013  . Accelerated junctional rhythm 01/17/2013  . Sinus bradycardia 01/17/2013  . Diastolic dysfunction 35/00/9381  . Acute on chronic renal failure ([place]) 05/30/2012  . Osteoarthritis   . Hypokalemia   . Erectile dysfunction   . Chronic headaches   . Hyperlipidemia   . Morbid obesity ([place])   . Atrial flutter ([employer])   . Hypertensive heart disease   . Microcytic anemia 03/22/2001    Current Outpatient Medications on File Prior to Visit  Medication Sig Dispense Refill  . amLODipine (NORVASC) 10 MG tablet Take 1 tablet (10 mg total) by mouth daily. 90 tablet 1  . APPLE CIDER VINEGAR PO Take 5 mLs by mouth 2 (two) times daily.     [name] aspirin EC 81 MG tablet Take 1 tablet  (81 mg total) by mouth daily. 90 tablet 1  . chlorthalidone (HYGROTON) 50 MG tablet TAKE 1 TABLET BY MOUTH ONCE DAILY 90 tablet 1  . metoprolol succinate (TOPROL-XL) 25 MG 24 hr tablet TAKE 1 TABLET BY MOUTH ONCE DAILY 90 tablet 1  . Multiple Vitamins-Minerals (CENTRUM SILVER PO) Take 1 tablet by mouth daily.    . pravastatin (PRAVACHOL) 40 MG tablet Take 1 tablet (40 mg total) by mouth every evening. 90 tablet 1  . tadalafil (CIALIS) 20 MG tablet Take 1 tablet by mouth as needed.     No current facility-administered medications on file prior to visit.     No Known Allergies   Objective:  BP (!) 160/78 (BP Location: Left Arm, Patient Position: Sitting, Cuff Size: Normal)   Pulse (!) 56   Temp 98.8 F (37.1 C) (Oral)   Ht 6\' 1"  (1.854 m)   Wt (!) 324 lb (147 kg)   SpO2 95%   BMI 42.75 kg/m   Physical Exam  Constitutional: He is oriented to person, place, and time. He appears well-developed and well-nourished.  obese  Musculoskeletal:       Lumbar back: He exhibits tenderness (right lower back). He exhibits normal range of motion and no bony tenderness.       Back:  TTP right upper gluteal.   Neurological: He is alert and oriented to person, place, and time. He has normal strength. No sensory deficit. Gait normal.  Skin: Skin is warm and  dry.  Psychiatric: He has a normal mood and affect. His behavior is normal. Judgment and thought content normal.  Vitals reviewed.   Assessment and Plan :  1. Right-sided low back pain without sciatica, unspecified chronicity -Patient presents with back and thigh pain after changing a car tire 2 weeks ago.  Clinically he is improving.  There are no concerning signs on physical exam.  His pain improves with stretches which were demonstrated.  Advised hydration, heat, stretches, massage.  Return to clinic in 3 weeks if no improvement.  Return to clinic sooner if symptoms fail to improve   Mercer Pod, PA-C  [facility] 09/28/2017 9:42 AM

## 2017-09-28 NOTE — Patient Instructions (Addendum)
Perform gentle, light stretches 2-3 times a day (see below). Apply heat and/or ice (see below) daily. Use whatever feels the best.    Put a tennis ball between your back and a wall. Gentle massage the area with rolling the ball around the affected area.   Stay well hydrated - try to drink 32-64 oz/day.   If you feel like you need a new pillow, try "My Pillow". If you sleep on your back, put a pillow under the back of your knees to keep a slight bend. If you sleep on your side, put a pillow between your knees to keep your back properly aligned.     Stretching and range of motion exercises These exercises warm up your muscles and joints and improve the movement and flexibility of your back. These exercises also help to relieve pain, numbness, and tingling.   Exercise A: Single knee to chest  1. Lie on your back on a firm surface with both legs straight. 2. Bend one of your knees. Use your hands to move your knee up toward your chest until you feel a gentle stretch in your lower back and buttock. ? Hold your leg in this position by holding onto the front of your knee. ? Keep your other leg as straight as possible. 3. Hold for __________ seconds. 4. Slowly return to the starting position. 5. Repeat with your other leg. Repeat __________ times. Complete this exercise __________ times a day.   Exercise A: Lumbar rotation  1. Lie on your back on a firm surface and bend your knees. 2. Straighten your arms out to your sides so each arm forms an "L" shape with a side of your body (a 90 degree angle). 3. Slowly move both of your knees to one side of your body until you feel a stretch in your lower back. Try not to let your shoulders move off of the floor. 4. Hold for 10-20 seconds. 5. Tense your abdominal muscles and slowly move your knees back to the starting position. 6. Repeat this exercise on the other side of your body. Repeat 2-3 times. Complete this exercise 1-2 times a  day. Exercise B: Prone extension on elbows  1. Lie on your abdomen on a firm surface. 2. Prop yourself up on your elbows. 3. Use your arms to help lift your chest up until you feel a gentle stretch in your abdomen and your lower back. ? This will place some of your body weight on your elbows. If this is uncomfortable, try stacking pillows under your chest. ? Your hips should stay down, against the surface that you are lying on. Keep your hip and back muscles relaxed. 4. Hold for 10-20 seconds. 5. Slowly relax your upper body and return to the starting position. Repeat 1-2 times. Complete this exercise 1-2 times a day.   Posture and body mechanics  Body mechanics refers to the movements and positions of your body while you do your daily activities. Posture is part of body mechanics. Good posture and healthy body mechanics can help to relieve stress in your body's tissues and joints. Good posture means that your spine is in its natural S-curve position (your spine is neutral), your shoulders are pulled back slightly, and your head is not tipped forward. The following are general guidelines for applying improved posture and body mechanics to your everyday activities. Standing   When standing, keep your spine neutral and your feet about hip-width apart. Keep a slight bend in your knees.  Your ears, shoulders, and hips should line up.  When you do a task in which you stand in one place for a long time, place one foot up on a stable object that is 2-4 inches (5-10 cm) high, such as a footstool. This helps keep your spine neutral. Sitting   When sitting, keep your spine neutral and keep your feet flat on the floor. Use a footrest, if necessary, and keep your thighs parallel to the floor. Avoid rounding your shoulders, and avoid tilting your head forward.  When working at a desk or a computer, keep your desk at a height where your hands are slightly lower than your elbows. Slide your chair under  your desk so you are close enough to maintain good posture.  When working at a computer, place your monitor at a height where you are looking straight ahead and you do not have to tilt your head forward or downward to look at the screen. Resting   When lying down and resting, avoid positions that are most painful for you.  If you have pain with activities such as sitting, bending, stooping, or squatting (flexion-based activities), lie in a position in which your body does not bend very much. For example, avoid curling up on your side with your arms and knees near your chest (fetal position).  If you have pain with activities such as standing for a long time or reaching with your arms (extension-based activities), lie with your spine in a neutral position and bend your knees slightly. Try the following positions:  Lying on your side with a pillow between your knees.  Lying on your back with a pillow under your knees. Lifting   When lifting objects, keep your feet at least shoulder-width apart and tighten your abdominal muscles.  Bend your knees and hips and keep your spine neutral. It is important to lift using the strength of your legs, not your back. Do not lock your knees straight out.  Always ask for help to lift heavy or awkward objects.       Back Pain, Adult Many adults have back pain from time to time. Common causes of back pain include:  A strained muscle or ligament.  Wear and tear (degeneration) of the spinal disks.  Arthritis.  A hit to the back.  Back pain can be short-lived (acute) or last a long time (chronic). A physical exam, lab tests, and imaging studies may be done to find the cause of your pain. Follow these instructions at home: Managing pain and stiffness  If directed, apply heat to the affected area as often as told by your health care provider. Use the heat source that your health care provider recommends, such as a moist heat pack or a heating  pad. ? Place a towel between your skin and the heat source. ? Leave the heat on for 20-30 minutes. ? Remove the heat if your skin turns bright red. This is especially important if you are unable to feel pain, heat, or cold. You have a greater risk of getting burned.  If directed, apply ice to the injured area: ? Put ice in a plastic bag. ? Place a towel between your skin and the bag. ? Leave the ice on for 20 minutes, 2-3 times a day for the first 2-3 days. Activity  Do not stay in bed. Resting more than 1-2 days can delay your recovery.  Take short walks on even surfaces as soon as you are able. Try to  increase the length of time you walk each day.  Do not sit, drive, or stand in one place for more than 30 minutes at a time. Sitting or standing for long periods of time can put stress on your back.  Use proper lifting techniques. When you bend and lift, use positions that put less stress on your back: ? [place] your knees. ? Keep the load close to your body. ? Avoid twisting.  Exercise regularly as told by your health care provider. Exercising will help your back heal faster. This also helps prevent back injuries by keeping muscles strong and flexible.  Your health care provider may recommend that you see a physical therapist. This person can help you come up with a safe exercise program. Do any exercises as told by your physical therapist. Lifestyle  Maintain a healthy weight. Extra weight puts stress on your back and makes it difficult to have good posture.  Avoid activities or situations that make you feel anxious or stressed. Learn ways to manage anxiety and stress. One way to manage stress is through exercise. Stress and anxiety increase muscle tension and can make back pain worse. General instructions  Sleep on a firm mattress in a comfortable position. Try lying on your side with your knees slightly bent. If you lie on your back, put a pillow under your knees.  Follow your  treatment plan as told by your health care provider. This may include: ? Cognitive or behavioral therapy. ? Acupuncture or massage therapy. ? Meditation or yoga. Contact a health care provider if:  You have pain that is not relieved with rest or medicine.  You have increasing pain going down into your legs or buttocks.  Your pain does not improve in 2 weeks.  You have pain at night.  You lose weight.  You have a fever or chills. Get help right away if:  You develop new bowel or bladder control problems.  You have unusual weakness or numbness in your arms or legs.  You develop nausea or vomiting.  You develop abdominal pain.  You feel faint. Summary  Many adults have back pain from time to time. A physical exam, lab tests, and imaging studies may be done to find the cause of your pain.  Use proper lifting techniques. When you bend and lift, use positions that put less stress on your back.  Take over-the-counter and prescription medicines and apply heat or ice as directed by your health care provider. This information is not intended to replace advice given to you by your health care provider. Make sure you discuss any questions you have with your health care provider. Document Released: 03/30/2005 Document Revised: 05/04/2016 Document Reviewed: 05/04/2016 Elsevier Interactive Patient Education  2018 [employer].  IF you received an x-ray today, you will receive an invoice from [facility]. Please contact [facility] at [phone] with questions or concerns regarding your invoice.   IF you received labwork today, you will receive an invoice from [facility]. Please contact [facility] at [phone] with questions or concerns regarding your invoice.   Our billing staff will not be able to assist you with questions regarding bills from these companies.  You will be contacted with the lab results as soon as they are available. The fastest way to get your  results is to activate your My Chart account. Instructions are located on the last page of this paperwork. If you have not heard from [place] regarding the results in 2 weeks, please contact this  office.

## 2017-10-03 ENCOUNTER — Encounter (HOSPITAL_COMMUNITY): Payer: Self-pay

## 2017-10-03 ENCOUNTER — Emergency Department (HOSPITAL_COMMUNITY): Payer: Medicare HMO

## 2017-10-03 ENCOUNTER — Emergency Department (HOSPITAL_COMMUNITY)
Admission: EM | Admit: 2017-10-03 | Discharge: 2017-10-03 | Disposition: A | Discharge status: Home / Self Care | Payer: Medicare HMO | Attending: Emergency Medicine | Admitting: Emergency Medicine

## 2017-10-03 DIAGNOSIS — Z79899 Other long term (current) drug therapy: Secondary | ICD-10-CM | POA: Insufficient documentation

## 2017-10-03 DIAGNOSIS — I130 Hypertensive heart and chronic kidney disease with heart failure and stage 1 through stage 4 chronic kidney disease, or unspecified chronic kidney disease: Secondary | ICD-10-CM | POA: Diagnosis not present

## 2017-10-03 DIAGNOSIS — N189 Chronic kidney disease, unspecified: Secondary | ICD-10-CM

## 2017-10-03 DIAGNOSIS — Z96653 Presence of artificial knee joint, bilateral: Secondary | ICD-10-CM | POA: Diagnosis not present

## 2017-10-03 DIAGNOSIS — S99922A Unspecified injury of left foot, initial encounter: Secondary | ICD-10-CM | POA: Diagnosis not present

## 2017-10-03 DIAGNOSIS — M79675 Pain in left toe(s): Secondary | ICD-10-CM | POA: Diagnosis present

## 2017-10-03 DIAGNOSIS — F17220 Nicotine dependence, chewing tobacco, uncomplicated: Secondary | ICD-10-CM | POA: Insufficient documentation

## 2017-10-03 DIAGNOSIS — R69 Illness, unspecified: Secondary | ICD-10-CM | POA: Diagnosis not present

## 2017-10-03 DIAGNOSIS — M109 Gout, unspecified: Secondary | ICD-10-CM

## 2017-10-03 DIAGNOSIS — I5032 Chronic diastolic (congestive) heart failure: Secondary | ICD-10-CM | POA: Diagnosis not present

## 2017-10-03 DIAGNOSIS — M79672 Pain in left foot: Secondary | ICD-10-CM | POA: Diagnosis not present

## 2017-10-03 DIAGNOSIS — M10072 Idiopathic gout, left ankle and foot: Secondary | ICD-10-CM | POA: Insufficient documentation

## 2017-10-03 DIAGNOSIS — E876 Hypokalemia: Secondary | ICD-10-CM | POA: Diagnosis not present

## 2017-10-03 DIAGNOSIS — N183 Chronic kidney disease, stage 3 (moderate): Secondary | ICD-10-CM | POA: Insufficient documentation

## 2017-10-03 DIAGNOSIS — Z7982 Long term (current) use of aspirin: Secondary | ICD-10-CM | POA: Diagnosis not present

## 2017-10-03 LAB — I-STAT CHEM 8, ED
BUN: 26 mg/dL — ABNORMAL HIGH (ref 6–20)
CHLORIDE: 95 mmol/L — AB (ref 101–111)
Calcium, Ion: 1.13 mmol/L — ABNORMAL LOW (ref 1.15–1.40)
Creatinine, Ser: 1.8 mg/dL — ABNORMAL HIGH (ref 0.61–1.24)
Glucose, Bld: 124 mg/dL — ABNORMAL HIGH (ref 65–99)
HCT: 39 % (ref 39.0–52.0)
HEMOGLOBIN: 13.3 g/dL (ref 13.0–17.0)
Potassium: 2.8 mmol/L — ABNORMAL LOW (ref 3.5–5.1)
SODIUM: 141 mmol/L (ref 135–145)
TCO2: 28 mmol/L (ref 22–32)

## 2017-10-03 LAB — URIC ACID: URIC ACID, SERUM: 11.3 mg/dL — AB (ref 4.4–7.6)

## 2017-10-03 MED ORDER — POTASSIUM CHLORIDE ER 10 MEQ PO TBCR: 10.0000 meq | EXTENDED_RELEASE_TABLET | Freq: Every day | ORAL | 0 refills | Status: DC

## 2017-10-03 MED ORDER — HYDROCODONE-ACETAMINOPHEN 5-325 MG PO TABS
1.0000 | ORAL_TABLET | Freq: Once | ORAL | Status: AC
  Administered 2017-10-03: 1 via ORAL
  Filled 2017-10-03: qty 1

## 2017-10-03 MED ORDER — POTASSIUM CHLORIDE CRYS ER 20 MEQ PO TBCR
40.0000 meq | EXTENDED_RELEASE_TABLET | Freq: Once | ORAL | Status: AC
  Administered 2017-10-03: 40 meq via ORAL
  Filled 2017-10-03: qty 2

## 2017-10-03 MED ORDER — HYDROCODONE-ACETAMINOPHEN 5-325 MG PO TABS: 2.0000 | ORAL_TABLET | ORAL | 0 refills | Status: DC | PRN

## 2017-10-03 NOTE — ED Provider Notes (Signed)
[facility] EMERGENCY DEPARTMENT Provider Note   CSN: [id number] Arrival date & time: 10/03/17  5638     History   Chief Complaint Chief Complaint  Patient presents with  . Toe Pain    HPI Nathan Grant is a 72 y.o. male.  72 year old male presents with complaint of left great toe pain x2 days.  Patient denies any injury to the area, states it is red, swollen, painful.  No history of gout. Last Hgb A1C 03/2017- 6.5, not on medications for this. Also hx CHF, CKD stage 3, reports chronic leg/ankle swelling that not is any worse than usual for him.      Past Medical History:  Diagnosis Date  . Atrial flutter ([place])    s/p cardioversion 06/2010  . Bradycardia    a. Accelerated junctional & sinus bradycardia during 01/2013 adm.  . Chronic headaches   . Degenerative joint disease     End-stage degenerative joint disease, left knee  . Erectile dysfunction   . HTN (hypertension)   . Hypertensive heart disease    a. Admit for CP 01/2013 felt due to this. Cath with normal coronary anatomy.  . Microcytic anemia  03/22/2001  . Morbid obesity ([place])   . NSVT (nonsustained ventricular tachycardia) ([place])    a. During 01/2013 adm.  . Obesity   . Osteoarthritis   . Tendonitis   . Tobacco chew use     History of chewing tobacco usage.   . Urinary tract infection     Pseudomonas urinary tract infection    Patient Active Problem List   Diagnosis Date Noted  . Chronic diastolic CHF (congestive heart failure) ([place]) 09/06/2014  . CKD (chronic kidney disease) stage 3, GFR 30-59 ml/min ([facility]) 09/06/2014  . Hypercholesterolemia 12/21/2013  . HTN (hypertension) 01/17/2013  . NSVT (nonsustained ventricular tachycardia) ([place]) 01/17/2013  . Accelerated junctional rhythm 01/17/2013  . Sinus bradycardia 01/17/2013  . Diastolic dysfunction 75/64/3329  . Acute on chronic renal failure ([place]) 05/30/2012  . Osteoarthritis   . Hypokalemia   . Erectile dysfunction   .  Chronic headaches   . Hyperlipidemia   . Morbid obesity ([place])   . Atrial flutter ([place])   . Hypertensive heart disease   . Microcytic anemia 03/22/2001    Past Surgical History:  Procedure Laterality Date  . CARDIOVERSION  07/01/2010  . LEFT HEART CATHETERIZATION WITH CORONARY ANGIOGRAM N/A 01/16/2013   Procedure: LEFT HEART CATHETERIZATION WITH CORONARY ANGIOGRAM;  Surgeon: Peter M Martinique, MD;  Location: [facility] CATH LAB;  Service: Cardiovascular;  Laterality: N/A;  . TOTAL KNEE ARTHROPLASTY  May 2009   Bilateral  . VASECTOMY          Home Medications    Prior to Admission medications   Medication Sig Start Date End Date Taking? Authorizing Provider  amLODipine (NORVASC) 10 MG tablet Take 1 tablet (10 mg total) by mouth daily. 09/23/17   Tereasa Coop, PA-C  APPLE CIDER VINEGAR PO Take 5 mLs by mouth 2 (two) times daily.     [provider]  aspirin EC 81 MG tablet Take 1 tablet (81 mg total) by mouth daily. 03/18/17   Tereasa Coop, PA-C  chlorthalidone (HYGROTON) 50 MG tablet TAKE 1 TABLET BY MOUTH ONCE DAILY 09/23/17   Tereasa Coop, PA-C  HYDROcodone-acetaminophen (NORCO/VICODIN) 5-325 MG tablet Take 2 tablets by mouth every 4 (four) hours as needed. 10/03/17   Tacy Learn, PA-C  metoprolol succinate (TOPROL-XL) 25 MG 24 hr tablet  TAKE 1 TABLET BY MOUTH ONCE DAILY 09/23/17   Tereasa Coop, PA-C  Multiple Vitamins-Minerals (CENTRUM SILVER PO) Take 1 tablet by mouth daily.    [provider]  potassium chloride (K-DUR) 10 MEQ tablet Take 1 tablet (10 mEq total) by mouth daily. 10/03/17   Tacy Learn, PA-C  pravastatin (PRAVACHOL) 40 MG tablet Take 1 tablet (40 mg total) by mouth every evening. 09/23/17   Tereasa Coop, PA-C  tadalafil (CIALIS) 20 MG tablet Take 1 tablet by mouth as needed. 01/09/13   [provider]    Family History Family History  Problem Relation Age of Onset  . Alcohol abuse Brother   . Hypertension Brother   .  Hypertension Mother     Social History Social History   Tobacco Use  . Smoking status: Never Smoker  . Smokeless tobacco: Current User    Types: Chew  Substance Use Topics  . Alcohol use: No  . Drug use: No     Allergies   Patient has no known allergies.   Review of Systems Review of Systems  Constitutional: Negative for fever.  Musculoskeletal: Positive for arthralgias.  Skin: Positive for color change. Negative for rash and wound.  Neurological: Negative for numbness.  Hematological: Does not bruise/bleed easily.  Psychiatric/Behavioral: Negative for confusion.  All other systems reviewed and are negative.    Physical Exam Updated Vital Signs BP (!) 149/91 (BP Location: Right Arm)   Pulse (!) 55   Temp 97.7 F (36.5 C) (Oral)   Resp 18   Ht 6\' 1"  (1.854 m)   Wt (!) 147.4 kg (325 lb)   SpO2 99%   BMI 42.88 kg/m   Physical Exam  Constitutional: He is oriented to person, place, and time. He appears well-developed and well-nourished. No distress.  HENT:  Head: Normocephalic and atraumatic.  Cardiovascular: Intact distal pulses.  Pulmonary/Chest: Effort normal.  Musculoskeletal: He exhibits edema and tenderness. He exhibits no deformity.  Neurological: He is alert and oriented to person, place, and time.  Skin: Skin is dry. He is not diaphoretic. There is erythema.     Psychiatric: He has a normal mood and affect. His behavior is normal.  Nursing note and vitals reviewed.    ED Treatments / Results  Labs (all labs ordered are listed, but only abnormal results are displayed) Labs Reviewed  URIC ACID - Abnormal; Notable for the following components:      Result Value   Uric Acid, Serum 11.3 (*)    All other components within normal limits  I-STAT CHEM 8, ED - Abnormal; Notable for the following components:   Potassium 2.8 (*)    Chloride 95 (*)    BUN 26 (*)    Creatinine, Ser 1.80 (*)    Glucose, Bld 124 (*)    Calcium, Ion 1.13 (*)    All other  components within normal limits    EKG None  Radiology Dg Foot Complete Left  Result Date: 10/03/2017 CLINICAL DATA:  Pt complains of L great toe pain since Friday. Denies injury to toe. Denies hx of gout or previous pain/injury to this toe. EXAM: LEFT FOOT - COMPLETE 3+ VIEW COMPARISON:  02/05/2017 FINDINGS: No fracture.  No bone lesion. Joints are normally spaced and aligned. No significant arthropathic change. Small plantar calcaneal spur. Subcutaneous edema is noted along the ankle to midfoot. IMPRESSION: No fracture, dislocation or significant joint abnormality. Electronically Signed   By: Lajean Manes M.D.   On:  10/03/2017 10:40    Procedures Procedures (including critical care time)  Medications Ordered in ED Medications  HYDROcodone-acetaminophen (NORCO/VICODIN) 5-325 MG per tablet 1 tablet (1 tablet Oral Given 10/03/17 1158)  potassium chloride SA (K-DUR,KLOR-CON) CR tablet 40 mEq (40 mEq Oral Given 10/03/17 1157)     Initial Impression / Assessment and Plan / ED Course  I have reviewed the triage vital signs and the nursing notes.  Pertinent labs & imaging results that were available during my care of the patient were reviewed by me and considered in my medical decision making (see chart for details).  Clinical Course as of Oct 03 1209  Sun Oct 03, 6825  [id number] 72 year old male presents with complaint of left great toe pain.  Onset 2 days ago.  Patient has mild erythema with moderate tenderness at his left great MTP.  No history of injury or prior gout.  Patient has a history of stage III chronic kidney disease.  His uric acid is elevated at 11.3, i-STAT Chem-8 with a potassium of 2.8 and slight increase in his creatinine to 1.8 today compared to previous 1.5.  Case discussed with Dr. Alvino Chapel, agrees with plan for p.o. replacement of potassium in the ER today, prescription for potassium for the next few days with recommendation of his PCP recheck his potassium this week.  Also  given prescription for Norco for the pain in his foot.  Discussed possible reasons for his episode of gout today as well as his chemistry changes, possibly due to his worsening kidney function, he is also on a diuretic.  Advised to discuss these with his PCP.  Patient and significant other verbalized understanding of discharge instructions and plan.   [LM]    Clinical Course User Index [LM] Tacy Learn, PA-C    Final Clinical Impressions(s) / ED Diagnoses   Final diagnoses:  Acute gout involving toe of left foot, unspecified cause  Hypokalemia  Chronic kidney disease, unspecified CKD stage    ED Discharge Orders        Ordered    HYDROcodone-acetaminophen (NORCO/VICODIN) 5-325 MG tablet  Every 4 hours PRN     10/03/17 1151    potassium chloride (K-DUR) 10 MEQ tablet  Daily     10/03/17 1151       Roque Lias 10/03/17 1211    Davonna Belling, MD 10/03/17 1531

## 2017-10-03 NOTE — Discharge Instructions (Addendum)
Your potassium is low today at 2.8.  This could be due to your fluid pill or also due to your chronic kidney disease. Take potassium supplement as prescribed daily and see your doctor in the next few days for repeat blood work to check your kidney status and potassium level. Information given on gout, this could be due to foods you eat or due to your chronic kidney disease.  You can take Norco as needed as prescribed for your pain.  Avoid medications like Aleve or Advil because these medicines are filtered by your kidneys.

## 2017-10-03 NOTE — ED Notes (Signed)
Pt reports toe pain since Friday. Left great toe is red and swollen, denies any history of gout

## 2017-10-03 NOTE — ED Triage Notes (Signed)
Pt presents for evaluation of L great toe pain since Friday. Denies injury to toe. Denies hx of gout or previous pain/injury to this toe. Ambulatory with cane.

## 2017-10-03 NOTE — ED Notes (Signed)
Patient transported to X-ray 

## 2017-10-04 ENCOUNTER — Telehealth: Payer: Self-pay | Admitting: *Deleted

## 2017-10-04 NOTE — Telephone Encounter (Signed)
Pharmacy called related to Rx: Norco dosing .Marland KitchenMarland KitchenEDCM clarified with EDP (Hedges) to change Rx to: take 1 tablet every 4 hours PRN pain.

## 2017-10-08 ENCOUNTER — Encounter: Payer: Self-pay | Admitting: Physician Assistant

## 2017-10-08 ENCOUNTER — Ambulatory Visit (INDEPENDENT_AMBULATORY_CARE_PROVIDER_SITE_OTHER): Payer: Medicare HMO | Admitting: Physician Assistant

## 2017-10-08 VITALS — BP 155/78 | HR 57 | Temp 98.7°F | Resp 17 | Ht 73.0 in | Wt 319.0 lb

## 2017-10-08 DIAGNOSIS — E119 Type 2 diabetes mellitus without complications: Secondary | ICD-10-CM

## 2017-10-08 DIAGNOSIS — I10 Essential (primary) hypertension: Secondary | ICD-10-CM | POA: Diagnosis not present

## 2017-10-08 DIAGNOSIS — E876 Hypokalemia: Secondary | ICD-10-CM | POA: Diagnosis not present

## 2017-10-08 DIAGNOSIS — M10372 Gout due to renal impairment, left ankle and foot: Secondary | ICD-10-CM | POA: Diagnosis not present

## 2017-10-08 LAB — POCT GLYCOSYLATED HEMOGLOBIN (HGB A1C): Hemoglobin A1C: 6.3 % — AB (ref 4.0–5.6)

## 2017-10-08 MED ORDER — PREDNISONE 20 MG PO TABS: ORAL_TABLET | ORAL | 0 refills | Status: AC

## 2017-10-08 MED ORDER — POTASSIUM CHLORIDE CRYS ER 20 MEQ PO TBCR: 20.0000 meq | EXTENDED_RELEASE_TABLET | Freq: Every day | ORAL | 3 refills | Status: DC

## 2017-10-08 MED ORDER — LISINOPRIL 20 MG PO TABS: 20.0000 mg | ORAL_TABLET | Freq: Every day | ORAL | 3 refills | Status: DC

## 2017-10-08 NOTE — Progress Notes (Signed)
10/08/2017 8:38 AM   DOB: [birth year] / MRN: [id number]  SUBJECTIVE:  Nathan Grant is a 72 y.o. male presenting for ED follow up for gout and hypokalemia. He was treated with narcotics for the former and placed on kdur 10 which he did not pick up. He has had hypokalemia in the past however this has been mild and he was on supplementation. History of well controlled diabetes.   He BP has never been tightly controlled. Take 50 chlrothalidone, metop, norvasc.  No on ACE but used to be.  He is willing to go back on that today.   Current Outpatient Medications:  .  amLODipine (NORVASC) 10 MG tablet, Take 1 tablet (10 mg total) by mouth daily., Disp: 90 tablet, Rfl: 1 .  aspirin EC 81 MG tablet, Take 1 tablet (81 mg total) by mouth daily., Disp: 90 tablet, Rfl: 1 .  chlorthalidone (HYGROTON) 50 MG tablet, TAKE 1 TABLET BY MOUTH ONCE DAILY, Disp: 90 tablet, Rfl: 1 .  metoprolol succinate (TOPROL-XL) 25 MG 24 hr tablet, TAKE 1 TABLET BY MOUTH ONCE DAILY, Disp: 90 tablet, Rfl: 1 .  Multiple Vitamins-Minerals (CENTRUM SILVER PO), Take 1 tablet by mouth daily., Disp: , Rfl:  .  pravastatin (PRAVACHOL) 40 MG tablet, Take 1 tablet (40 mg total) by mouth every evening., Disp: 90 tablet, Rfl: 1 .  tadalafil (CIALIS) 20 MG tablet, Take 1 tablet by mouth as needed., Disp: , Rfl:  .  lisinopril (PRINIVIL,ZESTRIL) 20 MG tablet, Take 1 tablet (20 mg total) by mouth daily., Disp: 90 tablet, Rfl: 3 .  potassium chloride SA (K-DUR,KLOR-CON) 20 MEQ tablet, Take 1 tablet (20 mEq total) by mouth daily., Disp: 90 tablet, Rfl: 3 .  predniSONE (DELTASONE) 20 MG tablet, Take 3 in the morning for 3 days, then 2 in the morning for 3 days, and then 1 in the morning for 3 days., Disp: 18 tablet, Rfl: 0  He has No Known Allergies.   He  has a past medical history of Atrial flutter ([place]), Bradycardia, Chronic headaches, Degenerative joint disease, Erectile dysfunction, HTN (hypertension), Hypertensive heart disease,  Microcytic anemia ( 03/22/2001), Morbid obesity ([place]), NSVT (nonsustained ventricular tachycardia) ([place]), Obesity, Osteoarthritis, Tendonitis, Tobacco chew use, and Urinary tract infection.    He  reports that he has never smoked. His smokeless tobacco use includes chew. He reports that he does not drink alcohol or use drugs. He  reports that he currently engages in sexual activity. The patient  has a past surgical history that includes Total knee arthroplasty (May 2009); Cardioversion (07/01/2010); Vasectomy; and left heart catheterization with coronary angiogram (N/A, 01/16/2013).  His family history includes Alcohol abuse in his brother; Hypertension in his brother and mother.  Review of Systems  Constitutional: Negative for chills, diaphoresis and fever.  Gastrointestinal: Negative for nausea.  Musculoskeletal: Positive for joint pain (foot and ankle).  Skin: Negative for rash.  Neurological: Negative for dizziness.    The problem list and medications were reviewed and updated by myself where necessary and exist elsewhere in the encounter.   OBJECTIVE:  BP (!) 155/78   Pulse (!) 57   Temp 98.7 F (37.1 C) (Oral)   Resp 17   Ht 6\' 1"  (1.854 m)   Wt (!) 319 lb (144.7 kg)   SpO2 98%   BMI 42.09 kg/m   Wt Readings from Last 3 Encounters:  10/08/17 (!) 319 lb (144.7 kg)  10/03/17 (!) 325 lb (147.4 kg)  09/28/17 (!) 324 lb (  147 kg)   Temp Readings from Last 3 Encounters:  10/08/17 98.7 F (37.1 C) (Oral)  10/03/17 97.7 F (36.5 C) (Oral)  09/28/17 98.8 F (37.1 C) (Oral)   BP Readings from Last 3 Encounters:  10/08/17 (!) 155/78  10/03/17 (!) 149/91  09/28/17 (!) 160/78   Pulse Readings from Last 3 Encounters:  10/08/17 (!) 57  10/03/17 (!) 55  09/28/17 (!) 56    Physical Exam  Constitutional: He is oriented to person, place, and time. He appears well-developed. He does not appear ill.  Eyes: Pupils are equal, round, and reactive to light. Conjunctivae and EOM are  normal.  Cardiovascular: Normal rate.  Pulmonary/Chest: Effort normal.  Abdominal: He exhibits no distension.  Musculoskeletal: Normal range of motion. He exhibits edema and tenderness (left foot ankle swollen, warm and exquisitely tender). He exhibits no deformity.  Neurological: He is alert and oriented to person, place, and time. No cranial nerve deficit. Coordination normal.  Skin: Skin is warm and dry. He is not diaphoretic.  Psychiatric: He has a normal mood and affect.  Nursing note and vitals reviewed.   Lab Results  Component Value Date   HGBA1C 6.3 (A) 10/08/2017    Lab Results  Component Value Date   WBC 5.5 03/18/2017   HGB 13.3 10/03/2017   HCT 39.0 10/03/2017   MCV 71 (L) 03/18/2017   PLT 335 03/18/2017    Lab Results  Component Value Date   CREATININE 1.80 (H) 10/03/2017   BUN 26 (H) 10/03/2017   NA 141 10/03/2017   K 2.8 (L) 10/03/2017   CL 95 (L) 10/03/2017   CO2 29 07/14/2017    Lab Results  Component Value Date   ALT 18 03/18/2017   AST 25 03/18/2017   ALKPHOS 50 03/18/2017   BILITOT 0.4 03/18/2017   Lab Results  Component Value Date   CHOL 132 03/18/2017   HDL 44 03/18/2017   LDLCALC 66 03/18/2017   TRIG 110 03/18/2017   CHOLHDL 3.0 03/18/2017   Lab Results  Component Value Date   LABURIC 11.3 (H) 10/03/2017   Lab Results  Component Value Date   HGBA1C 6.3 (A) 10/08/2017      ASSESSMENT AND PLAN:  Nathan Grant was seen today for foot pain.  Diagnoses and all orders for this visit:  Acute gout due to renal impairment involving left ankle: Patient non toxic appearing with no SOB, DOE.  Uric acid elevated in the ED most likely to chlorthalidone.  Given history of CHF will hold on changing that medication for now.  RTC in 2 weeks for recheck med compliance and BP recheck.  -     predniSONE (DELTASONE) 20 MG tablet; Take 3 in the morning for 3 days, then 2 in the morning for 3 days, and then 1 in the morning for 3 days. -     POCT  glycosylated hemoglobin (Hb A1C)  Hypokalemia -     Renal Function Panel -     potassium chloride SA (K-DUR,KLOR-CON) 20 MEQ tablet; Take 1 tablet (20 mEq total) by mouth daily.  Uncontrolled hypertension -     lisinopril (PRINIVIL,ZESTRIL) 20 MG tablet; Take 1 tablet (20 mg total) by mouth daily.  Diet-controlled diabetes mellitus (HCC) -     lisinopril (PRINIVIL,ZESTRIL) 20 MG tablet; Take 1 tablet (20 mg total) by mouth daily.    The patient is advised to call or return to clinic if he does not see an improvement in symptoms, or to seek  the care of the closest emergency department if he worsens with the above plan.   Philis Fendt, MHS, PA-C [facility] 10/08/2017 8:38 AM

## 2017-10-08 NOTE — Patient Instructions (Addendum)
Start your prednisone today for acute gout.  Please take the potassium along with 20 mg of lisinopril.  Leave all of your other meds the same.   Come back in two weeks.  Come back sooner if you are not getting better.      IF you received an x-ray today, you will receive an invoice from [facility]. Please contact [facility] at [phone] with questions or concerns regarding your invoice.   IF you received labwork today, you will receive an invoice from [place]. Please contact [facility] at [phone] with questions or concerns regarding your invoice.   Our billing staff will not be able to assist you with questions regarding bills from these companies.  You will be contacted with the lab results as soon as they are available. The fastest way to get your results is to activate your My Chart account. Instructions are located on the last page of this paperwork. If you have not heard from [place] regarding the results in 2 weeks, please contact this office.

## 2017-10-09 LAB — RENAL FUNCTION PANEL
Albumin: 3.9 g/dL (ref 3.5–4.8)
BUN / CREAT RATIO: 15 (ref 10–24)
BUN: 23 mg/dL (ref 8–27)
CO2: 28 mmol/L (ref 20–29)
Calcium: 8.9 mg/dL (ref 8.6–10.2)
Chloride: 95 mmol/L — ABNORMAL LOW (ref 96–106)
Creatinine, Ser: 1.58 mg/dL — ABNORMAL HIGH (ref 0.76–1.27)
GFR, EST AFRICAN AMERICAN: 49 mL/min/{1.73_m2} — AB (ref >59)
GFR, EST NON AFRICAN AMERICAN: 43 mL/min/{1.73_m2} — AB (ref >59)
GLUCOSE: 99 mg/dL (ref 65–99)
POTASSIUM: 3.9 mmol/L (ref 3.5–5.2)
Phosphorus: 3 mg/dL (ref 2.5–4.5)
Sodium: 138 mmol/L (ref 134–144)

## 2017-10-25 ENCOUNTER — Ambulatory Visit (INDEPENDENT_AMBULATORY_CARE_PROVIDER_SITE_OTHER): Payer: Medicare HMO | Admitting: Physician Assistant

## 2017-10-25 ENCOUNTER — Encounter: Payer: Self-pay | Admitting: Physician Assistant

## 2017-10-25 ENCOUNTER — Other Ambulatory Visit: Payer: Self-pay

## 2017-10-25 VITALS — BP 138/78 | HR 54 | Temp 98.7°F | Resp 18 | Ht 73.0 in | Wt 322.0 lb

## 2017-10-25 DIAGNOSIS — I878 Other specified disorders of veins: Secondary | ICD-10-CM | POA: Diagnosis not present

## 2017-10-25 DIAGNOSIS — I10 Essential (primary) hypertension: Secondary | ICD-10-CM | POA: Diagnosis not present

## 2017-10-25 NOTE — Progress Notes (Signed)
10/25/2017 8:54 AM   DOB: 1945-06-10 / MRN: [id number]  SUBJECTIVE:  Nathan Grant is a 72 y.o. male presenting for follow up of gout. Pain is gone. Feels that he is walking on a wet sponge. Taking Norvasc ten.  He is in no pain. Worse on days that he walks and better upon waking up in the morning.  Taking 50 of chlorthalidone.   He has No Known Allergies.   He  has a past medical history of Atrial flutter ([place]), Bradycardia, Chronic headaches, Degenerative joint disease, Erectile dysfunction, HTN (hypertension), Hypertensive heart disease, Microcytic anemia ( 03/22/2001), Morbid obesity ([place]), NSVT (nonsustained ventricular tachycardia) ([place]), Obesity, Osteoarthritis, Tendonitis, Tobacco chew use, and Urinary tract infection.    He  reports that he has never smoked. His smokeless tobacco use includes chew. He reports that he does not drink alcohol or use drugs. He  reports that he currently engages in sexual activity. The patient  has a past surgical history that includes Total knee arthroplasty (May 2009); Cardioversion (07/01/2010); Vasectomy; and left heart catheterization with coronary angiogram (N/A, 01/16/2013).  His family history includes Alcohol abuse in his brother; Hypertension in his brother and mother.  Review of Systems  Constitutional: Negative for fever.  Respiratory: Negative for cough, shortness of breath and wheezing.   Cardiovascular: Positive for leg swelling. Negative for chest pain.  Neurological: Negative for dizziness.    The problem list and medications were reviewed and updated by myself where necessary and exist elsewhere in the encounter.   OBJECTIVE:  BP 138/78   Pulse (!) 54   Temp 98.7 F (37.1 C) (Oral)   Resp 18   Ht 6\' 1"  (1.854 m)   Wt (!) 322 lb (146.1 kg)   SpO2 97%   BMI 42.48 kg/m   Wt Readings from Last 3 Encounters:  10/25/17 (!) 322 lb (146.1 kg)  10/08/17 (!) 319 lb (144.7 kg)  10/03/17 (!) 325 lb (147.4 kg)   Temp Readings from  Last 3 Encounters:  10/25/17 98.7 F (37.1 C) (Oral)  10/08/17 98.7 F (37.1 C) (Oral)  10/03/17 97.7 F (36.5 C) (Oral)   BP Readings from Last 3 Encounters:  10/25/17 138/78  10/08/17 (!) 155/78  10/03/17 (!) 149/91   Pulse Readings from Last 3 Encounters:  10/25/17 (!) 54  10/08/17 (!) 57  10/03/17 (!) 55    Physical Exam  Constitutional: He is oriented to person, place, and time. He appears well-developed. He is active.  Non-toxic appearance. He does not appear ill.  Eyes: Pupils are equal, round, and reactive to light. Conjunctivae and EOM are normal.  Cardiovascular: Normal rate.  Pulmonary/Chest: Effort normal.  Abdominal: He exhibits no distension.  Musculoskeletal: Normal range of motion. He exhibits edema (2+ bilaterally.  No TTP. ).  Neurological: He is alert and oriented to person, place, and time. No cranial nerve deficit. Coordination normal.  Skin: Skin is warm and dry. He is not diaphoretic. No pallor.  Psychiatric: He has a normal mood and affect.  Nursing note and vitals reviewed.   Lab Results  Component Value Date   HGBA1C 6.3 (A) 10/08/2017    Lab Results  Component Value Date   WBC 5.5 03/18/2017   HGB 13.3 10/03/2017   HCT 39.0 10/03/2017   MCV 71 (L) 03/18/2017   PLT 335 03/18/2017    Lab Results  Component Value Date   CREATININE 1.58 (H) 10/08/2017   BUN 23 10/08/2017   NA 138 10/08/2017  K 3.9 10/08/2017   CL 95 (L) 10/08/2017   CO2 28 10/08/2017    Lab Results  Component Value Date   ALT 18 03/18/2017   AST 25 03/18/2017   ALKPHOS 50 03/18/2017   BILITOT 0.4 03/18/2017    Lab Results  Component Value Date   CHOL 132 03/18/2017   HDL 44 03/18/2017   LDLCALC 66 03/18/2017   TRIG 110 03/18/2017   CHOLHDL 3.0 03/18/2017     ASSESSMENT AND PLAN:  Nathan Grant was seen today for gout.  Diagnoses and all orders for this visit:  Venous stasis of both lower extremities Comments: MOst likely 2/2 elevated temperatures and  max dose of norvasc. BP looks great.  Advised compression stockings.    Well-controlled hypertension -     Renal Function Panel    The patient is advised to call or return to clinic if he does not see an improvement in symptoms, or to seek the care of the closest emergency department if he worsens with the above plan.   Philis Fendt, MHS, PA-C [facility] 10/25/2017 8:54 AM

## 2017-10-25 NOTE — Patient Instructions (Addendum)
  Please keep your legs elevated, especially on hot days. Please purchase some compression stockings from [facility].   On another note I will be leaving the practice in the end of August 2019 and will be moving back to the [place] area.  It was a pleasure to get to know you and serve you medically.  Please continue to take your medications.  I am happy to help you transition to another provider in the practice. I would recommend you seeing either Dr. Nolon Rod or Dr. Pamella Pert.   I will be at [address], [place],  [address] [facility].       IF you received an x-ray today, you will receive an invoice from [facility]. Please contact [facility] at [phone] with questions or concerns regarding your invoice.   IF you received labwork today, you will receive an invoice from [place]. Please contact [facility] at [phone] with questions or concerns regarding your invoice.   Our billing staff will not be able to assist you with questions regarding bills from these companies.  You will be contacted with the lab results as soon as they are available. The fastest way to get your results is to activate your My Chart account. Instructions are located on the last page of this paperwork. If you have not heard from [place] regarding the results in 2 weeks, please contact this office.

## 2017-10-26 LAB — RENAL FUNCTION PANEL
Albumin: 4.2 g/dL (ref 3.5–4.8)
BUN/Creatinine Ratio: 11 (ref 10–24)
BUN: 19 mg/dL (ref 8–27)
CALCIUM: 9 mg/dL (ref 8.6–10.2)
CHLORIDE: 98 mmol/L (ref 96–106)
CO2: 26 mmol/L (ref 20–29)
Creatinine, Ser: 1.72 mg/dL — ABNORMAL HIGH (ref 0.76–1.27)
GFR calc Af Amer: 45 mL/min/{1.73_m2} — ABNORMAL LOW (ref >59)
GFR calc non Af Amer: 39 mL/min/{1.73_m2} — ABNORMAL LOW (ref >59)
GLUCOSE: 93 mg/dL (ref 65–99)
PHOSPHORUS: 2.7 mg/dL (ref 2.5–4.5)
POTASSIUM: 3.5 mmol/L (ref 3.5–5.2)
SODIUM: 138 mmol/L (ref 134–144)

## 2017-12-03 ENCOUNTER — Other Ambulatory Visit: Payer: Self-pay

## 2017-12-03 ENCOUNTER — Encounter (HOSPITAL_COMMUNITY): Payer: Self-pay

## 2017-12-03 DIAGNOSIS — Z7982 Long term (current) use of aspirin: Secondary | ICD-10-CM | POA: Insufficient documentation

## 2017-12-03 DIAGNOSIS — I5032 Chronic diastolic (congestive) heart failure: Secondary | ICD-10-CM | POA: Insufficient documentation

## 2017-12-03 DIAGNOSIS — I77811 Abdominal aortic ectasia: Secondary | ICD-10-CM | POA: Insufficient documentation

## 2017-12-03 DIAGNOSIS — N183 Chronic kidney disease, stage 3 (moderate): Secondary | ICD-10-CM | POA: Diagnosis not present

## 2017-12-03 DIAGNOSIS — Z79899 Other long term (current) drug therapy: Secondary | ICD-10-CM | POA: Diagnosis not present

## 2017-12-03 DIAGNOSIS — R1013 Epigastric pain: Secondary | ICD-10-CM | POA: Diagnosis present

## 2017-12-03 DIAGNOSIS — I4892 Unspecified atrial flutter: Secondary | ICD-10-CM | POA: Insufficient documentation

## 2017-12-03 DIAGNOSIS — I130 Hypertensive heart and chronic kidney disease with heart failure and stage 1 through stage 4 chronic kidney disease, or unspecified chronic kidney disease: Secondary | ICD-10-CM | POA: Insufficient documentation

## 2017-12-03 DIAGNOSIS — K529 Noninfective gastroenteritis and colitis, unspecified: Secondary | ICD-10-CM | POA: Diagnosis not present

## 2017-12-03 DIAGNOSIS — Z96653 Presence of artificial knee joint, bilateral: Secondary | ICD-10-CM | POA: Diagnosis not present

## 2017-12-03 DIAGNOSIS — R109 Unspecified abdominal pain: Secondary | ICD-10-CM | POA: Diagnosis not present

## 2017-12-03 LAB — COMPREHENSIVE METABOLIC PANEL
ALBUMIN: 4.1 g/dL (ref 3.5–5.0)
ALT: 15 U/L (ref 0–44)
AST: 21 U/L (ref 15–41)
Alkaline Phosphatase: 37 U/L — ABNORMAL LOW (ref 38–126)
Anion gap: 11 (ref 5–15)
BUN: 21 mg/dL (ref 8–23)
CO2: 29 mmol/L (ref 22–32)
CREATININE: 1.88 mg/dL — AB (ref 0.61–1.24)
Calcium: 9 mg/dL (ref 8.9–10.3)
Chloride: 101 mmol/L (ref 98–111)
GFR calc non Af Amer: 34 mL/min — ABNORMAL LOW (ref >60)
GFR, EST AFRICAN AMERICAN: 39 mL/min — AB (ref >60)
GLUCOSE: 115 mg/dL — AB (ref 70–99)
Potassium: 3.3 mmol/L — ABNORMAL LOW (ref 3.5–5.1)
SODIUM: 141 mmol/L (ref 135–145)
Total Bilirubin: 0.8 mg/dL (ref 0.3–1.2)
Total Protein: 7 g/dL (ref 6.5–8.1)

## 2017-12-03 LAB — CBC
HCT: 38.5 % — ABNORMAL LOW (ref 39.0–52.0)
Hemoglobin: 12.2 g/dL — ABNORMAL LOW (ref 13.0–17.0)
MCH: 22.9 pg — AB (ref 26.0–34.0)
MCHC: 31.7 g/dL (ref 30.0–36.0)
MCV: 72.2 fL — AB (ref 78.0–100.0)
PLATELETS: 302 10*3/uL (ref 150–400)
RBC: 5.33 MIL/uL (ref 4.22–5.81)
RDW: 16.1 % — ABNORMAL HIGH (ref 11.5–15.5)
WBC: 6.3 10*3/uL (ref 4.0–10.5)

## 2017-12-03 LAB — LIPASE, BLOOD: Lipase: 28 U/L (ref 11–51)

## 2017-12-03 NOTE — ED Triage Notes (Signed)
Pt coming from home c/o middle abdominal pain that started at 1pm today after eating a "[employer]." No N/V/D.

## 2017-12-04 ENCOUNTER — Ambulatory Visit: Payer: Medicare HMO | Admitting: Osteopathic Medicine

## 2017-12-04 ENCOUNTER — Emergency Department (HOSPITAL_COMMUNITY)
Admission: EM | Admit: 2017-12-04 | Discharge: 2017-12-04 | Disposition: A | Discharge status: Home / Self Care | Payer: Medicare HMO | Attending: Emergency Medicine | Admitting: Emergency Medicine

## 2017-12-04 ENCOUNTER — Emergency Department (HOSPITAL_COMMUNITY): Payer: Medicare HMO

## 2017-12-04 DIAGNOSIS — N183 Chronic kidney disease, stage 3 (moderate): Secondary | ICD-10-CM | POA: Diagnosis not present

## 2017-12-04 DIAGNOSIS — Z79899 Other long term (current) drug therapy: Secondary | ICD-10-CM | POA: Diagnosis not present

## 2017-12-04 DIAGNOSIS — I77811 Abdominal aortic ectasia: Secondary | ICD-10-CM | POA: Diagnosis not present

## 2017-12-04 DIAGNOSIS — K529 Noninfective gastroenteritis and colitis, unspecified: Secondary | ICD-10-CM | POA: Diagnosis not present

## 2017-12-04 DIAGNOSIS — I4892 Unspecified atrial flutter: Secondary | ICD-10-CM | POA: Diagnosis not present

## 2017-12-04 DIAGNOSIS — I130 Hypertensive heart and chronic kidney disease with heart failure and stage 1 through stage 4 chronic kidney disease, or unspecified chronic kidney disease: Secondary | ICD-10-CM | POA: Diagnosis not present

## 2017-12-04 DIAGNOSIS — I5032 Chronic diastolic (congestive) heart failure: Secondary | ICD-10-CM | POA: Diagnosis not present

## 2017-12-04 DIAGNOSIS — Z96653 Presence of artificial knee joint, bilateral: Secondary | ICD-10-CM | POA: Diagnosis not present

## 2017-12-04 DIAGNOSIS — Z7982 Long term (current) use of aspirin: Secondary | ICD-10-CM | POA: Diagnosis not present

## 2017-12-04 DIAGNOSIS — R109 Unspecified abdominal pain: Secondary | ICD-10-CM | POA: Diagnosis not present

## 2017-12-04 MED ORDER — CIPROFLOXACIN HCL 500 MG PO TABS: 500.0000 mg | ORAL_TABLET | Freq: Two times a day (BID) | ORAL | 0 refills | Status: DC

## 2017-12-04 MED ORDER — METRONIDAZOLE 500 MG PO TABS: 500.0000 mg | ORAL_TABLET | Freq: Two times a day (BID) | ORAL | 0 refills | Status: DC

## 2017-12-04 MED ORDER — GI COCKTAIL ~~LOC~~
30.0000 mL | Freq: Once | ORAL | Status: AC
  Administered 2017-12-04: 30 mL via ORAL
  Filled 2017-12-04: qty 30

## 2017-12-04 MED ORDER — SUCRALFATE 1 GM/10ML PO SUSP: 1.0000 g | Freq: Three times a day (TID) | ORAL | 0 refills | Status: DC

## 2017-12-04 NOTE — ED Provider Notes (Signed)
[facility] DEPT Provider Note   CSN: [id number] Arrival date & time: 12/03/17  2121     History   Chief Complaint Chief Complaint  Patient presents with  . Abdominal Pain    HPI Nathan Grant is a 72 y.o. male.  HPI 72 year old male with history of atrial flutter comes in with chief complaint of abdominal pain.  Patient started having epigastric abdominal pain around 1 PM.  Pain started after he had some food.  He denies any associated nausea, vomiting, diarrhea.  Pain however has been constant and he has intermittent episodes of worsening intensity.  Patient has no history of similar pain in the past and it is a nonradiating pain that is sharp in nature.  Patient has no known history of GERD.  Patient denies any diaphoresis, fevers, chills.  Patient had waited several hours prior to me evaluating him, and he reports that his pain feels better at the moment than it did before he came to the ER.  Patient denies any history of heavy drinking or smoking.  He does chew tobacco.  Past Medical History:  Diagnosis Date  . Atrial flutter ([place])    s/p cardioversion 06/2010  . Bradycardia    a. Accelerated junctional & sinus bradycardia during 01/2013 adm.  . Chronic headaches   . Degenerative joint disease     End-stage degenerative joint disease, left knee  . Erectile dysfunction   . HTN (hypertension)   . Hypertensive heart disease    a. Admit for CP 01/2013 felt due to this. Cath with normal coronary anatomy.  . Microcytic anemia  03/22/2001  . Morbid obesity ([place])   . NSVT (nonsustained ventricular tachycardia) ([place])    a. During 01/2013 adm.  . Obesity   . Osteoarthritis   . Tendonitis   . Tobacco chew use     History of chewing tobacco usage.   . Urinary tract infection     Pseudomonas urinary tract infection    Patient Active Problem List   Diagnosis Date Noted  . Chronic diastolic CHF (congestive heart failure) ([place]) 09/06/2014  .  CKD (chronic kidney disease) stage 3, GFR 30-59 ml/min (HCC) 09/06/2014  . Hypercholesterolemia 12/21/2013  . HTN (hypertension) 01/17/2013  . NSVT (nonsustained ventricular tachycardia) ([place]) 01/17/2013  . Accelerated junctional rhythm 01/17/2013  . Sinus bradycardia 01/17/2013  . Diastolic dysfunction 96/29/5284  . Acute on chronic renal failure ([place]) 05/30/2012  . Osteoarthritis   . Hypokalemia   . Erectile dysfunction   . Chronic headaches   . Hyperlipidemia   . Morbid obesity ([place])   . Atrial flutter ([place])   . Hypertensive heart disease   . Microcytic anemia 03/22/2001    Past Surgical History:  Procedure Laterality Date  . CARDIOVERSION  07/01/2010  . LEFT HEART CATHETERIZATION WITH CORONARY ANGIOGRAM N/A 01/16/2013   Procedure: LEFT HEART CATHETERIZATION WITH CORONARY ANGIOGRAM;  Surgeon: Peter M Martinique, MD;  Location[facility] LAB;  Service: Cardiovascular;  Laterality: N/A;  . TOTAL KNEE ARTHROPLASTY  May 2009   Bilateral  . VASECTOMY          Home Medications    Prior to Admission medications   Medication Sig Start Date End Date Taking? Authorizing Provider  amLODipine (NORVASC) 10 MG tablet Take 1 tablet (10 mg total) by mouth daily. 09/23/17  Yes Tereasa Coop, PA-C  aspirin EC 81 MG tablet Take 1 tablet (81 mg total) by mouth daily. 03/18/17  Yes Tereasa Coop, PA-C  chlorthalidone (HYGROTON) 50 MG tablet TAKE 1 TABLET BY MOUTH ONCE DAILY Patient taking differently: Take 50 mg by mouth daily.  09/23/17  Yes Tereasa Coop, PA-C  lisinopril (PRINIVIL,ZESTRIL) 20 MG tablet Take 1 tablet (20 mg total) by mouth daily. 10/08/17  Yes Tereasa Coop, PA-C  metoprolol succinate (TOPROL-XL) 25 MG 24 hr tablet TAKE 1 TABLET BY MOUTH ONCE DAILY Patient taking differently: Take 25 mg by mouth daily.  09/23/17  Yes Tereasa Coop, PA-C  Multiple Vitamins-Minerals (CENTRUM SILVER PO) Take 1 tablet by mouth daily.   Yes [provider]  potassium chloride SA  (K-DUR,KLOR-CON) 20 MEQ tablet Take 1 tablet (20 mEq total) by mouth daily. 10/08/17  Yes Tereasa Coop, PA-C  pravastatin (PRAVACHOL) 40 MG tablet Take 1 tablet (40 mg total) by mouth every evening. 09/23/17  Yes Tereasa Coop, PA-C  sucralfate (CARAFATE) 1 GM/10ML suspension Take 10 mLs (1 g total) by mouth 4 (four) times daily -  with meals and at bedtime. 12/04/17   Varney Biles, MD    Family History Family History  Problem Relation Age of Onset  . Alcohol abuse Brother   . Hypertension Brother   . Hypertension Mother     Social History Social History   Tobacco Use  . Smoking status: Never Smoker  . Smokeless tobacco: Current User    Types: Chew  Substance Use Topics  . Alcohol use: No  . Drug use: No     Allergies   Patient has no known allergies.   Review of Systems Review of Systems  Constitutional: Positive for activity change.  Respiratory: Negative for shortness of breath.   Cardiovascular: Negative for chest pain.  Gastrointestinal: Positive for abdominal pain. Negative for diarrhea, nausea and vomiting.  Allergic/Immunologic: Negative for immunocompromised state.  Hematological: Does not bruise/bleed easily.  All other systems reviewed and are negative.    Physical Exam Updated Vital Signs BP 139/89   Pulse 61   Temp 97.8 F (36.6 C) (Oral)   Resp 20   Ht 6\' 1"  (1.854 m)   Wt (!) 147.4 kg   SpO2 98%   BMI 42.88 kg/m   Physical Exam  Constitutional: He is oriented to person, place, and time. He appears well-developed.  HENT:  Head: Atraumatic.  Neck: Neck supple.  Cardiovascular: Normal rate.  Pulmonary/Chest: Effort normal.  Abdominal: Soft. Normal appearance and bowel sounds are normal. He exhibits no ascites. There is tenderness in the epigastric area. There is no rebound, no guarding and negative Murphy's sign. No hernia.  Neurological: He is alert and oriented to person, place, and time.  Skin: Skin is warm.  Nursing note and  vitals reviewed.    ED Treatments / Results  Labs (all labs ordered are listed, but only abnormal results are displayed) Labs Reviewed  COMPREHENSIVE METABOLIC PANEL - Abnormal; Notable for the following components:      Result Value   Potassium 3.3 (*)    Glucose, Bld 115 (*)    Creatinine, Ser 1.88 (*)    Alkaline Phosphatase 37 (*)    GFR calc non Af Amer 34 (*)    GFR calc Af Amer 39 (*)    All other components within normal limits  CBC - Abnormal; Notable for the following components:   Hemoglobin 12.2 (*)    HCT 38.5 (*)    MCV 72.2 (*)    MCH 22.9 (*)    RDW 16.1 (*)    All other  components within normal limits  LIPASE, BLOOD    EKG None  Radiology Ct Abdomen Pelvis Wo Contrast  Result Date: 12/04/2017 CLINICAL DATA:  Middle abdominal pain EXAM: CT ABDOMEN AND PELVIS WITHOUT CONTRAST TECHNIQUE: Multidetector CT imaging of the abdomen and pelvis was performed following the standard protocol without IV contrast. COMPARISON:  None. FINDINGS: Lower chest: Lung bases demonstrate no acute consolidation or effusion. The heart size is upper limits of. Small hiatal hernia. Hepatobiliary: No focal liver abnormality is seen. No gallstones, gallbladder wall thickening, or biliary dilatation. Pancreas: Unremarkable. No pancreatic ductal dilatation or surrounding inflammatory changes. Spleen: Normal in size without focal abnormality. Adrenals/Urinary Tract: Adrenal glands are unremarkable. Kidneys are normal, without renal calculi, focal lesion, or hydronephrosis. Bladder is unremarkable. Stomach/Bowel: Stomach within normal limits. No dilated small bowel. Mild radiopaque material within distal small bowel loops. Thickening distal small bowel loops within the pelvis. No intramural air. Negative appendix. No colon wall thickening. Vascular/Lymphatic: Ectatic infrarenal abdominal aorta up to 2.7 cm. Mild aortic atherosclerosis. No significantly enlarged lymph nodes Reproductive: Prostate is  unremarkable. Other: Negative for free air. Small free fluid in the pelvis. Edema within the central mesentery. Musculoskeletal: No acute or significant osseous findings. IMPRESSION: 1. Marked wall thickening of distal small bowel loops within the pelvis compatible with enteritis. This could be secondary to infection, inflammatory bowel disease, or ischemia. No intramural air or free air. 2. Small free fluid within the abdomen and pelvis. 3. Ectatic infrarenal abdominal aorta up to 2.7 cm. Ectatic abdominal aorta at risk for aneurysm development. Recommend followup by ultrasound in 5 years. This recommendation follows ACR consensus guidelines: White Paper of the ACR Incidental Findings Committee II on Vascular Findings. J Am Coll Radiol 2013; 10:789-794. Electronically Signed   By: Donavan Foil M.D.   On: 12/04/2017 03:12    Procedures Procedures (including critical care time)  Medications Ordered in ED Medications  gi cocktail (Maalox,Lidocaine,Donnatal) (30 mLs Oral Given 12/04/17 0202)     Initial Impression / Assessment and Plan / ED Course  I have reviewed the triage vital signs and the nursing notes.  Pertinent labs & imaging results that were available during my care of the patient were reviewed by me and considered in my medical decision making (see chart for details).  Clinical Course as of Dec 06 539  Sat Dec 04, 2017  0325 CT Noncon shows signs of enteritis. Noncontrasted study was ordered because patient has CKD with creatinine of 1.8. With sudden onset of pain, ischemia is in the differential diagnosis, however it was deemed less likely than pancreatitis, pancreatic mass, gastritis, SBO or even infection.  Patient does use tobacco, but denies any heavy smoking or any history of substance abuse or alcohol abuse.  He also has enlarged aortic diameter, at risk of having AAA.  Results will be discussed with the patient.  CT ABDOMEN PELVIS WO CONTRAST [AN]  7253 Results from the ER  workup discussed with the patient face to face and all questions answered to the best of my ability.  Pt reports that his pain improved significantly after the GI cocktail. Clear liquid diet recommended. Strict ER return precautions have been discussed, and patient is agreeing with the plan and is comfortable with the workup done and the recommendations from the ER.    [AN]  0400 I do not think that patient is having mesenteric ischemia at this time.  Although because of his pain out of proportion to the exam along with history of  a flutter he is at risk for it.  He has been made aware to return to the ER if his symptoms get worse.  We will not give any antibiotics..   [AN]    Clinical Course User Index [AN] Varney Biles, MD    72 year old male comes in with chief complaint of epigastric abdominal pain He has history of atrial flutter, hypertension.  No history of abdominal surgery.  Pain has been constant with waxing and waning intensity.  DDx includes: Pancreatitis Hepatobiliary pathology including cholecystitis Gastritis/PUD SBO ACS syndrome Aortic Dissection  Patient's exam is not peritoneal, however he reports pretty intense pain, and the pain has been fairly constant therefore we will get a CT abdomen and pelvis.  Patient's creatinine is slightly elevated because of his CKD, I do not see any reason to get a CT Angio at this time.  Final Clinical Impressions(s) / ED Diagnoses   Final diagnoses:  Ectatic abdominal aorta ([place])  Enteritis    ED Discharge Orders         Ordered    sucralfate (CARAFATE) 1 GM/10ML suspension  3 times daily with meals & bedtime     12/04/17 0401    ciprofloxacin (CIPRO) 500 MG tablet  Every 12 hours,   Status:  Discontinued     12/04/17 0402    metroNIDAZOLE (FLAGYL) 500 MG tablet  2 times daily,   Status:  Discontinued     12/04/17 Daphne, Gennell How, MD 12/05/17 [phone]

## 2017-12-04 NOTE — Discharge Instructions (Addendum)
CT scan results here showed 2 things, slightly enlarged aorta -which is likely because of history of blood pressure and tobacco use.  You will need an ultrasound to screen the aorta in 3 to 5 years, to ensure it is not getting severely wide.  More importantly, CT scan is showing significant inflammation of the small bowel.  As discussed, this could be because of infection, inflammatory condition including ulcers, or because poor blood supply to the small intestine.  Your labs overall are reassuring and you felt better after our treatment, therefore we feel comfortable sending you home.  You must return to the ER immediately if you start having worsening pain, inability to keep any medications down, bloody stools or high fevers.

## 2017-12-04 NOTE — ED Notes (Signed)
Patient offered water. MD approved.

## 2018-01-20 ENCOUNTER — Ambulatory Visit: Payer: Medicare HMO | Admitting: Urgent Care

## 2018-01-25 NOTE — Progress Notes (Signed)
Chief Complaint  Patient presents with  . Hypertension    3 month f/u    HPI Diabetes Mellitus: Patient presents for follow up of diabetes. Symptoms: none. Symptoms have stabilized. Patient denies foot ulcerations, hyperglycemia, hypoglycemia , increase appetite, nausea, paresthesia of the feet, polydipsia, polyuria and visual disturbances.  Evaluation to date has been included: hemoglobin A1C.  Home sugars: patient does not check sugars. Treatment to date: more intensive attention to diet which has been effective.  Lab Results  Component Value Date   HGBA1C 6.3 (A) 10/08/2017   Pt was previously treated with Glipizide until 2018 He was discontinued by his PCP since his diabetes was well controlled He denies hypoglycemia and sticks to a diabetic diet  Hypertension: Patient here for follow-up of elevated blood pressure. He is exercising and is adherent to low salt diet.  Blood pressure is well controlled at home. Cardiac symptoms none. Patient denies chest pain, chest pressure/discomfort, claudication, exertional chest pressure/discomfort, irregular heart beat, lower extremity edema, near-syncope, orthopnea and palpitations.  Cardiovascular risk factors: diabetes mellitus, dyslipidemia, hypertension, male gender and obesity (BMI >= 30 kg/m2). Use of agents associated with hypertension: none. History of target organ damage: none. BP Readings from Last 3 Encounters:  01/26/18 (!) 148/72  12/04/17 139/89  10/25/17 138/78   Lab Results  Component Value Date   CREATININE 1.88 (H) 12/03/2017    Morbid obesity Patient has a home daycare Reports that he has been moving about and exercising running after children He is avoiding sugary foods Wt Readings from Last 3 Encounters:  01/26/18 (!) 318 lb 3.2 oz (144.3 kg)  12/03/17 (!) 325 lb (147.4 kg)  10/25/17 (!) 322 lb (146.1 kg)   Dyslipidemia: Patient presents for evaluation of lipids.  Compliance with treatment thus far has been  excellent.  A repeat fasting lipid profile was done.  The patient does use medications that may worsen dyslipidemias (corticosteroids, progestins, anabolic steroids, diuretics, beta-blockers, amiodarone, cyclosporine, olanzapine). The patient exercises daily. The patient is known to have coexisting coronary artery disease.    Lab Results  Component Value Date   CHOL 132 03/18/2017   CHOL 142 05/30/2012   CHOL 154 07/03/2011   Lab Results  Component Value Date   HDL 44 03/18/2017   HDL 54 05/30/2012   HDL 56.30 07/03/2011   Lab Results  Component Value Date   LDLCALC 66 03/18/2017   LDLCALC 74 05/30/2012   LDLCALC 83 07/03/2011   Lab Results  Component Value Date   TRIG 110 03/18/2017   TRIG 70 05/30/2012   TRIG 76.0 07/03/2011   Lab Results  Component Value Date   CHOLHDL 3.0 03/18/2017   CHOLHDL 2.6 05/30/2012   CHOLHDL 3 07/03/2011   No results found for: LDLDIRECT  The 10-year ASCVD risk score [name] DC Jr., et al., 2013) is: 47.4%   Values used to calculate the score:     Age: 72 years     Sex: Male     Is Non-Hispanic African American: No     Diabetic: Yes     Tobacco smoker: No     Systolic Blood Pressure: 149 mmHg     Is BP treated: Yes     HDL Cholesterol: 44 mg/dL     Total Cholesterol: 132 mg/dL  Colon Cancer Screening He had 2 colonoscopies so far He denies blood in his stool, unexpected weight loss or pain with defecation No rectal itching He does not smoke He does not have a family  history of colon cancer     Past Medical History:  Diagnosis Date  . Atrial flutter ([place])    s/p cardioversion 06/2010  . Bradycardia    a. Accelerated junctional & sinus bradycardia during 01/2013 adm.  . Chronic headaches   . Degenerative joint disease     End-stage degenerative joint disease, left knee  . Erectile dysfunction   . HTN (hypertension)   . Hypertensive heart disease    a. Admit for CP 01/2013 felt due to this. Cath with normal coronary  anatomy.  . Microcytic anemia  03/22/2001  . Morbid obesity ([place])   . NSVT (nonsustained ventricular tachycardia) ([place])    a. During 01/2013 adm.  . Obesity   . Osteoarthritis   . Tendonitis   . Tobacco chew use     History of chewing tobacco usage.   . Urinary tract infection     Pseudomonas urinary tract infection    Current Outpatient Medications  Medication Sig Dispense Refill  . amLODipine (NORVASC) 10 MG tablet Take 1 tablet (10 mg total) by mouth daily. 90 tablet 1  . aspirin EC 81 MG tablet Take 1 tablet (81 mg total) by mouth daily. 90 tablet 1  . chlorthalidone (HYGROTON) 50 MG tablet TAKE 1 TABLET BY MOUTH ONCE DAILY (Patient taking differently: Take 50 mg by mouth daily. ) 90 tablet 1  . lisinopril (PRINIVIL,ZESTRIL) 20 MG tablet Take 1 tablet (20 mg total) by mouth daily. 90 tablet 3  . metoprolol succinate (TOPROL-XL) 25 MG 24 hr tablet TAKE 1 TABLET BY MOUTH ONCE DAILY (Patient taking differently: Take 25 mg by mouth daily. ) 90 tablet 1  . Multiple Vitamins-Minerals (CENTRUM SILVER PO) Take 1 tablet by mouth daily.    . potassium chloride SA (K-DUR,KLOR-CON) 20 MEQ tablet Take 1 tablet (20 mEq total) by mouth daily. 90 tablet 3  . pravastatin (PRAVACHOL) 40 MG tablet Take 1 tablet (40 mg total) by mouth every evening. 90 tablet 1  . sucralfate (CARAFATE) 1 GM/10ML suspension Take 10 mLs (1 g total) by mouth 4 (four) times daily -  with meals and at bedtime. (Patient not taking: Reported on 01/26/2018) 420 mL 0   No current facility-administered medications for this visit.     Allergies: No Known Allergies  Past Surgical History:  Procedure Laterality Date  . CARDIOVERSION  07/01/2010  . LEFT HEART CATHETERIZATION WITH CORONARY ANGIOGRAM N/A 01/16/2013   Procedure: LEFT HEART CATHETERIZATION WITH CORONARY ANGIOGRAM;  Surgeon: Peter M Martinique, MD;  Location: [facility] CATH LAB;  Service: Cardiovascular;  Laterality: N/A;  . TOTAL KNEE ARTHROPLASTY  May 2009   Bilateral  .  VASECTOMY      Social History   Socioeconomic History  . Marital status: Married    Spouse name: Not on file  . Number of children: 6  . Years of education: Not on file  . Highest education level: Not on file  Occupational History  . Occupation: Day Care    Employer: RETIRED  Social Needs  . Financial resource strain: Not on file  . Food insecurity:    Worry: Not on file    Inability: Not on file  . Transportation needs:    Medical: Not on file    Non-medical: Not on file  Tobacco Use  . Smoking status: Never Smoker  . Smokeless tobacco: Current User    Types: Chew  Substance and Sexual Activity  . Alcohol use: No  . Drug use: No  . Sexual  activity: Yes  Lifestyle  . Physical activity:    Days per week: Not on file    Minutes per session: Not on file  . Stress: Not on file  Relationships  . Social connections:    Talks on phone: Not on file    Gets together: Not on file    Attends religious service: Not on file    Active member of club or organization: Not on file    Attends meetings of clubs or organizations: Not on file    Relationship status: Not on file  Other Topics Concern  . Not on file  Social History Narrative  . Not on file    Family History  Problem Relation Age of Onset  . Alcohol abuse Brother   . Hypertension Brother   . Hypertension Mother      ROS Review of Systems See HPI Constitution: No fevers or chills No malaise No diaphoresis Skin: No rash or itching Eyes: no blurry vision, no double vision GU: no dysuria or hematuria Neuro: no dizziness or headaches all others reviewed and negative   Objective: Vitals:   01/26/18 0812 01/26/18 0853  BP: (!) 179/84 (!) 148/72  Pulse: (!) 56   Resp: 17   Temp: 98.4 F (36.9 C)   TempSrc: Oral   SpO2: 97%   Weight: (!) 318 lb 3.2 oz (144.3 kg)   Height: 6\' 1"  (1.854 m)     Physical Exam  Constitutional: He is oriented to person, place, and time. He appears well-developed and  well-nourished.  HENT:  Head: Normocephalic and atraumatic.  Eyes: Conjunctivae and EOM are normal.  Cardiovascular: Normal rate, regular rhythm and normal heart sounds.  No murmur heard. Trace edema on LE bilaterally  Pulmonary/Chest: Effort normal and breath sounds normal. No stridor. No respiratory distress. He has no wheezes.  Neurological: He is alert and oriented to person, place, and time.    Assessment and Plan Romaldo was seen today for hypertension.  Diagnoses and all orders for this visit:  Type 2 diabetes mellitus with stage 3 chronic kidney disease, with long-term current use of insulin (HCC) - diet controlled and doing well -     Renal Function Panel -     Lipid panel  Morbid obesity ([place])  - improved with exercise   NSVT (nonsustained ventricular tachycardia) (HCC) - stable on metoprolol  Screening for colon cancer- discussed options, pt elected cologuard -     Cologuard  Mixed hyperlipidemia - discussed heart disease prevention   Essential hypertension-  bp in good range      Ansh Fauble A [employer]

## 2018-01-26 ENCOUNTER — Other Ambulatory Visit: Payer: Self-pay

## 2018-01-26 ENCOUNTER — Ambulatory Visit (INDEPENDENT_AMBULATORY_CARE_PROVIDER_SITE_OTHER): Payer: Medicare HMO | Admitting: Family Medicine

## 2018-01-26 ENCOUNTER — Encounter: Payer: Self-pay | Admitting: Family Medicine

## 2018-01-26 VITALS — BP 148/72 | HR 56 | Temp 98.4°F | Resp 17 | Ht 73.0 in | Wt 318.2 lb

## 2018-01-26 DIAGNOSIS — Z1211 Encounter for screening for malignant neoplasm of colon: Secondary | ICD-10-CM | POA: Diagnosis not present

## 2018-01-26 DIAGNOSIS — E1122 Type 2 diabetes mellitus with diabetic chronic kidney disease: Secondary | ICD-10-CM

## 2018-01-26 DIAGNOSIS — N183 Chronic kidney disease, stage 3 (moderate): Secondary | ICD-10-CM

## 2018-01-26 DIAGNOSIS — I472 Ventricular tachycardia: Secondary | ICD-10-CM | POA: Diagnosis not present

## 2018-01-26 DIAGNOSIS — Z794 Long term (current) use of insulin: Secondary | ICD-10-CM | POA: Diagnosis not present

## 2018-01-26 DIAGNOSIS — E782 Mixed hyperlipidemia: Secondary | ICD-10-CM | POA: Diagnosis not present

## 2018-01-26 DIAGNOSIS — I10 Essential (primary) hypertension: Secondary | ICD-10-CM | POA: Diagnosis not present

## 2018-01-26 DIAGNOSIS — I4729 Other ventricular tachycardia: Secondary | ICD-10-CM

## 2018-01-26 NOTE — Patient Instructions (Addendum)
If you have lab work done today you will be contacted with your lab results within the next 2 weeks.  If you have not heard from [place] then please contact us. The fastest way to get your results is to register for My Chart.   IF you received an x-ray today, you will receive an invoice from [facility]. Please contact [facility] at [phone] with questions or concerns regarding your invoice.   IF you received labwork today, you will receive an invoice from [facility]. Please contact [facility] at [phone] with questions or concerns regarding your invoice.   Our billing staff will not be able to assist you with questions regarding bills from these companies.  You will be contacted with the lab results as soon as they are available. The fastest way to get your results is to activate your My Chart account. Instructions are located on the last page of this paperwork. If you have not heard from [place] regarding the results in 2 weeks, please contact this office.     Chronic Kidney Disease, Adult Chronic kidney disease (CKD) occurs when the kidneys become damaged slowly over a long period of time. The kidneys are a pair of organs that do many important jobs in the body, including:  Removing waste and extra fluid from the blood to make urine.  Making hormones that maintain the amount of fluid in tissues and blood vessels.  Maintaining the right amount of fluids and chemicals in the body.  A small amount of kidney damage may not cause problems, but a large amount of damage may make it hard or impossible for the kidneys to work the way they should. If steps are not taken to slow down kidney damage or to stop it from getting worse, the kidneys may stop working permanently (end-stage renal disease or ESRD). Most of the time, CKD does not go away, but it can often be controlled. People who have CKD are usually able to live normal lives. What are the causes? The most common causes of  this condition are diabetes and high blood pressure (hypertension). Other causes include:  Heart and blood vessel (cardiovascular) disease.  Kidney diseases, such as: ? Glomerulonephritis. ? Interstitial nephritis. ? Polycystic kidney disease. ? Renal vascular disease.  Diseases that affect the immune system.  Genetic diseases.  Medicines that damage the kidneys, such as anti-inflammatory medicines.  Being around or being in contact with poisonous (toxic) substances.  A kidney or urinary infection that occurs again and again (recurs).  Vasculitis. This is swelling or inflammation of the blood vessels.  A problem with urine flow that may be caused by: ? Cancer. ? Having kidney stones more than one time. ? An enlarged prostate, in males.  What increases the risk? You are more likely to develop this condition if you:  Are older than age 32.  Are male.  Are African-American, Hispanic, Asian, [place], or American [place].  Are a current or former smoker.  Are obese.  Have a family history of kidney disease or failure.  Often take medicines that are damaging to the kidneys.  What are the signs or symptoms? Symptoms of this condition include:  Swelling (edema) of the face, legs, ankles, or feet.  Tiredness (lethargy) and having less energy.  Nausea or vomiting.  Confusion or trouble concentrating.  Problems with urination, such as: ? Painful or burning feeling during urination. ? Decreased urine production. ? Frequent urination, especially at night. ? Bloody urine.  Muscle twitches and cramps, especially in the legs.  Shortness of breath.  Weakness.  Loss of appetite.  Metallic taste in the mouth.  Trouble sleeping.  Dry, itchy skin.  A low blood count (anemia).  Pale lining of the eyelids and surface of the eye (conjunctiva).  Symptoms develop slowly and may not be obvious until the kidney damage becomes severe. It is possible to have  kidney disease for years without having any symptoms. How is this diagnosed? This condition may be diagnosed based on:  Blood tests.  Urine tests.  Imaging tests, such as an ultrasound or CT scan.  A test in which a sample of tissue is removed from the kidneys to be examined under a microscope (kidney biopsy).  These test results will help your health care provider determine how serious the CKD is. How is this treated? There is no cure for most cases of this condition, but treatment usually relieves symptoms and prevents or slows the progression of the disease. Treatment may include:  Making diet changes, which may require you to avoid alcohol, salty foods (sodium), and foods that are high in potassium, calcium, and protein.  Medicines: ? To lower blood pressure. ? To control blood glucose. ? To relieve anemia. ? To relieve swelling. ? To protect your bones. ? To improve the balance of electrolytes in your blood.  Removing toxic waste from the body through types of dialysis, if the kidneys can no longer do their job (kidney failure).  Managing any other conditions that are causing your CKD or making it worse.  Follow these instructions at home: Medicines  Take over-the-counter and prescription medicines only as told by your health care provider. The dose of some medicines that you take may need to be adjusted.  Do not take any new medicines unless approved by your health care provider. Many medicines can worsen your kidney damage.  Do not take any vitamin and mineral supplements unless approved by your health care provider. Many nutritional supplements can worsen your kidney damage. General instructions  Follow your prescribed diet as told by your health care provider.  Do not use any products that contain nicotine or tobacco, such as cigarettes and e-cigarettes. If you need help quitting, ask your health care provider.  Monitor and track your blood pressure at home.  Report changes in your blood pressure as told by your health care provider.  If you are being treated for diabetes, monitor and track your blood sugar (blood glucose) levels as told by your health care provider.  Maintain a healthy weight. If you need help with this, ask your health care provider.  Start or continue an exercise plan. Exercise at least 30 minutes a day, 5 days a week.  Keep your immunizations up to date as told by your health care provider.  Keep all follow-up visits as told by your health care provider. This is important. Where to find more information:  American Association of Kidney Patients: BombTimer.gl  National Kidney Foundation: www.kidney.[place] https://mathis.com/  [facility]: www.lifeoptions.org and www.kidneyschool.org Contact a health care provider if:  Your symptoms get worse.  You develop new symptoms. Get help right away if:  You develop symptoms of ESRD, which include: ? Headaches. ? Numbness in the hands or feet. ? Easy bruising. ? Frequent hiccups. ? Chest pain. ? Shortness of breath. ? Lack of menstruation, in women.  You have a fever.  You have decreased urine production.  You have pain  or bleeding when you urinate. Summary  Chronic kidney disease (CKD) occurs when the kidneys become damaged slowly over a long period of time.  The most common causes of this condition are diabetes and high blood pressure (hypertension).  There is no cure for most cases of this condition, but treatment usually relieves symptoms and prevents or slows the progression of the disease. Treatment may include a combination of medicines and lifestyle changes. This information is not intended to replace advice given to you by your health care provider. Make sure you discuss any questions you have with your health care provider. Document Released: 01/07/2008 Document Revised: 05/07/2016 Document Reviewed:  05/07/2016 Elsevier Interactive Patient Education  [employer].

## 2018-01-27 LAB — RENAL FUNCTION PANEL
ALBUMIN: 4.5 g/dL (ref 3.5–4.8)
BUN/Creatinine Ratio: 13 (ref 10–24)
BUN: 23 mg/dL (ref 8–27)
CO2: 30 mmol/L — ABNORMAL HIGH (ref 20–29)
Calcium: 9.6 mg/dL (ref 8.6–10.2)
Chloride: 101 mmol/L (ref 96–106)
Creatinine, Ser: 1.77 mg/dL — ABNORMAL HIGH (ref 0.76–1.27)
GFR, EST AFRICAN AMERICAN: 43 mL/min/{1.73_m2} — AB (ref >59)
GFR, EST NON AFRICAN AMERICAN: 37 mL/min/{1.73_m2} — AB (ref >59)
GLUCOSE: 90 mg/dL (ref 65–99)
PHOSPHORUS: 2.4 mg/dL — AB (ref 2.5–4.5)
POTASSIUM: 4.1 mmol/L (ref 3.5–5.2)
Sodium: 144 mmol/L (ref 134–144)

## 2018-01-27 LAB — LIPID PANEL
CHOL/HDL RATIO: 3 ratio (ref 0.0–5.0)
Cholesterol, Total: 141 mg/dL (ref 100–199)
HDL: 47 mg/dL (ref >39)
LDL Calculated: 72 mg/dL (ref 0–99)
TRIGLYCERIDES: 109 mg/dL (ref 0–149)
VLDL CHOLESTEROL CAL: 22 mg/dL (ref 5–40)

## 2018-01-31 DIAGNOSIS — Z1211 Encounter for screening for malignant neoplasm of colon: Secondary | ICD-10-CM | POA: Diagnosis not present

## 2018-02-02 ENCOUNTER — Ambulatory Visit: Payer: Self-pay | Admitting: *Deleted

## 2018-02-02 ENCOUNTER — Emergency Department (HOSPITAL_BASED_OUTPATIENT_CLINIC_OR_DEPARTMENT_OTHER)
Admission: EM | Admit: 2018-02-02 | Discharge: 2018-02-02 | Disposition: A | Discharge status: Home / Self Care | Payer: Medicare HMO | Attending: Emergency Medicine | Admitting: Emergency Medicine

## 2018-02-02 ENCOUNTER — Other Ambulatory Visit: Payer: Self-pay

## 2018-02-02 ENCOUNTER — Encounter (HOSPITAL_BASED_OUTPATIENT_CLINIC_OR_DEPARTMENT_OTHER): Payer: Self-pay

## 2018-02-02 DIAGNOSIS — M25531 Pain in right wrist: Secondary | ICD-10-CM | POA: Diagnosis present

## 2018-02-02 DIAGNOSIS — Z79899 Other long term (current) drug therapy: Secondary | ICD-10-CM | POA: Diagnosis not present

## 2018-02-02 DIAGNOSIS — F17220 Nicotine dependence, chewing tobacco, uncomplicated: Secondary | ICD-10-CM | POA: Diagnosis not present

## 2018-02-02 DIAGNOSIS — I130 Hypertensive heart and chronic kidney disease with heart failure and stage 1 through stage 4 chronic kidney disease, or unspecified chronic kidney disease: Secondary | ICD-10-CM | POA: Diagnosis not present

## 2018-02-02 DIAGNOSIS — Z7982 Long term (current) use of aspirin: Secondary | ICD-10-CM | POA: Diagnosis not present

## 2018-02-02 DIAGNOSIS — I5032 Chronic diastolic (congestive) heart failure: Secondary | ICD-10-CM | POA: Insufficient documentation

## 2018-02-02 DIAGNOSIS — M109 Gout, unspecified: Secondary | ICD-10-CM | POA: Diagnosis not present

## 2018-02-02 DIAGNOSIS — Z96653 Presence of artificial knee joint, bilateral: Secondary | ICD-10-CM | POA: Insufficient documentation

## 2018-02-02 DIAGNOSIS — N183 Chronic kidney disease, stage 3 (moderate): Secondary | ICD-10-CM | POA: Diagnosis not present

## 2018-02-02 DIAGNOSIS — R69 Illness, unspecified: Secondary | ICD-10-CM | POA: Diagnosis not present

## 2018-02-02 MED ORDER — PREDNISONE 10 MG (21) PO TBPK: ORAL_TABLET | Freq: Every day | ORAL | 0 refills | Status: DC

## 2018-02-02 MED ORDER — PREDNISONE 20 MG PO TABS
40.0000 mg | ORAL_TABLET | Freq: Once | ORAL | Status: AC
  Administered 2018-02-02: 40 mg via ORAL
  Filled 2018-02-02: qty 2

## 2018-02-02 NOTE — Telephone Encounter (Signed)
He called [facility] thinking it was still an urgent care center.   I let him know we were no longer an urgent care center.  He let me know his right hand and wrist and very painful and his hand is swollen.   He can't move his wrist or pick up anything with his hand.  He woke up like this today.   Denies injuries, accidents, etc.  See triage notes.  I have referred him to the [facility] [place] for evaluation.   There are no available openings with any of the providers at [facility] today.  He thanked me and said,  "I'm on my way to the urgent care now".     Reason for Disposition . [1] Large swelling or bruise (> 2 inches or 5 cm) AND [2] can't use injured hand normally (e.g., make a fist, open fully, hold a glass of water)  Answer Assessment - Initial Assessment Questions 1. MECHANISM: "How did the injury happen?"     Yesterday I woke up in pain.   Today I can't hardly bend my wrist and my right hand is swollen.   2. ONSET: "When did the injury happen?" (Minutes or hours ago)      Woke up with it yesterday morning. 3. APPEARANCE of INJURY: "What does the injury look like?"      No bruising or redness. 4. SEVERITY: "Can you use the hand normally?" "Can you bend your fingers into a ball and then fully open them?"     Can't use hand at all. 5. SIZE: For cuts, bruises, or swelling, ask: "How large is it?" (e.g., inches or centimeters;  entire hand or wrist)      No cuts, etc. 6. PAIN: "Is there pain?" If so, ask: "How bad is the pain?"  (Scale 1-10; or mild, moderate, severe)     Yes   10 on scale 7. TETANUS: For any breaks in the skin, ask: "When was the last tetanus booster?"     No breaks 8. OTHER SYMPTOMS: "Do you have any other symptoms?"      Can move fingers.   I can't move my hand or wrist.   9. PREGNANCY: "Is there any chance you are pregnant?" "When was your last menstrual period?"     N/A  Protocols used: HAND AND WRIST INJURY-A-AH

## 2018-02-02 NOTE — ED Triage Notes (Signed)
C/o pain/swelling to right wrist-started yesterday-denies injury-NAD-steady gait

## 2018-02-02 NOTE — ED Provider Notes (Signed)
[facility] EMERGENCY DEPARTMENT Provider Note   CSN: [id number] Arrival date & time: 02/02/18  1145     History   Chief Complaint Chief Complaint  Patient presents with  . Wrist Pain    HPI Nathan Grant is a 72 y.o. male resenting for evaluation of right wrist pain.  It started yesterday morning and has worsened.  He denies inciting trauma or history of wrist pain.  He reports difficulty making a fist and raising his arm because of the pain.  He denies fevers, chills, nausea, vomiting, chest pain, shortness of breath.  He reports a history of gout.  His first and only prior gout flare was 2 months ago and his great toe.  He also has osteoarthritis, most bothersome in the right shoulder.  He denies alcohol or tobacco use.  On medication review, he is on chlorthalidone.  HPI  Past Medical History:  Diagnosis Date  . Atrial flutter ([place])    s/p cardioversion 06/2010  . Bradycardia    a. Accelerated junctional & sinus bradycardia during 01/2013 adm.  . Chronic headaches   . Degenerative joint disease     End-stage degenerative joint disease, left knee  . Erectile dysfunction   . HTN (hypertension)   . Hypertensive heart disease    a. Admit for CP 01/2013 felt due to this. Cath with normal coronary anatomy.  . Microcytic anemia  03/22/2001  . Morbid obesity ([place])   . NSVT (nonsustained ventricular tachycardia) ([place])    a. During 01/2013 adm.  . Obesity   . Osteoarthritis   . Tendonitis   . Tobacco chew use     History of chewing tobacco usage.   . Urinary tract infection     Pseudomonas urinary tract infection    Patient Active Problem List   Diagnosis Date Noted  . Chronic diastolic CHF (congestive heart failure) ([place]) 09/06/2014  . CKD (chronic kidney disease) stage 3, GFR 30-59 ml/min ([facility]) 09/06/2014  . Hypercholesterolemia 12/21/2013  . HTN (hypertension) 01/17/2013  . NSVT (nonsustained ventricular tachycardia) ([place]) 01/17/2013  . Accelerated  junctional rhythm 01/17/2013  . Sinus bradycardia 01/17/2013  . Diastolic dysfunction 62/83/1517  . Acute on chronic renal failure ([place]) 05/30/2012  . Osteoarthritis   . Hypokalemia   . Erectile dysfunction   . Chronic headaches   . Hyperlipidemia   . Morbid obesity ([place])   . Atrial flutter ([place])   . Hypertensive heart disease   . Microcytic anemia 03/22/2001    Past Surgical History:  Procedure Laterality Date  . CARDIOVERSION  07/01/2010  . LEFT HEART CATHETERIZATION WITH CORONARY ANGIOGRAM N/A 01/16/2013   Procedure: LEFT HEART CATHETERIZATION WITH CORONARY ANGIOGRAM;  Surgeon: Peter M Martinique, MD;  Location: [facility] CATH LAB;  Service: Cardiovascular;  Laterality: N/A;  . TOTAL KNEE ARTHROPLASTY  May 2009   Bilateral  . VASECTOMY          Home Medications    Prior to Admission medications   Medication Sig Start Date End Date Taking? Authorizing Provider  amLODipine (NORVASC) 10 MG tablet Take 1 tablet (10 mg total) by mouth daily. 09/23/17   Tereasa Coop, PA-C  aspirin EC 81 MG tablet Take 1 tablet (81 mg total) by mouth daily. 03/18/17   Tereasa Coop, PA-C  chlorthalidone (HYGROTON) 50 MG tablet TAKE 1 TABLET BY MOUTH ONCE DAILY Patient taking differently: Take 50 mg by mouth daily.  09/23/17   Tereasa Coop, PA-C  lisinopril (PRINIVIL,ZESTRIL) 20 MG tablet Take  1 tablet (20 mg total) by mouth daily. 10/08/17   Tereasa Coop, PA-C  metoprolol succinate (TOPROL-XL) 25 MG 24 hr tablet TAKE 1 TABLET BY MOUTH ONCE DAILY Patient taking differently: Take 25 mg by mouth daily.  09/23/17   Tereasa Coop, PA-C  Multiple Vitamins-Minerals (CENTRUM SILVER PO) Take 1 tablet by mouth daily.    [provider]  potassium chloride SA (K-DUR,KLOR-CON) 20 MEQ tablet Take 1 tablet (20 mEq total) by mouth daily. 10/08/17   Tereasa Coop, PA-C  pravastatin (PRAVACHOL) 40 MG tablet Take 1 tablet (40 mg total) by mouth every evening. 09/23/17   Tereasa Coop, PA-C    predniSONE (STERAPRED UNI-PAK 21 TAB) 10 MG (21) TBPK tablet Take by mouth daily. Take 4 tabs by mouth daily  for 2 days, then 3 tabs for 2 days, then 2 tabs for 2 days, then 1 tabs for 2 days 02/02/18   Isabelle Course, MD  sucralfate (CARAFATE) 1 GM/10ML suspension Take 10 mLs (1 g total) by mouth 4 (four) times daily -  with meals and at bedtime. Patient not taking: Reported on 01/26/2018 12/04/17   Varney Biles, MD    Family History Family History  Problem Relation Age of Onset  . Alcohol abuse Brother   . Hypertension Brother   . Hypertension Mother     Social History Social History   Tobacco Use  . Smoking status: Never Smoker  . Smokeless tobacco: Current User    Types: Chew  Substance Use Topics  . Alcohol use: No  . Drug use: No     Allergies   Aspirin; Ibuprofen; and Tylenol [acetaminophen]   Review of Systems Review of Systems See HPI  Physical Exam Updated Vital Signs BP (!) 154/91 (BP Location: Left Arm)   Pulse 71   Temp 98.6 F (37 C) (Oral)   Resp 18   SpO2 98%   Physical Exam Gen: Well appearing, NAD, holding right wrist CV: RRR, no murmurs Pulm: Normal effort, CTA throughout, no wheezing MSK: right wrist swollen, warm. Limited ROM. Intact sensation. Other joints normal.    ED Treatments / Results  Labs (all labs ordered are listed, but only abnormal results are displayed) Labs Reviewed - No data to display  EKG None  Radiology No results found.  Procedures Procedures (including critical care time)  Medications Ordered in ED Medications  predniSONE (DELTASONE) tablet 40 mg (40 mg Oral Given 02/02/18 1359)     Initial Impression / Assessment and Plan / ED Course  I have reviewed the triage vital signs and the nursing notes.  Pertinent labs & imaging results that were available during my care of the patient were reviewed by me and considered in my medical decision making (see chart for details).     72 year old male with  history of gout in his great toe presenting with acute onset right wrist pain with associated swelling and warmth.  Risk factor includes chlorthalidone.  History and current presentation concerning for gout.  No fevers. Clinically well appearing.  Doubt septic arthritis. Shared decision making with patient. We will treat as gout flare with oral prednisone. His renal function is poor so we cannot use NSAIDs. Given return precautions. If symptoms do not improve, instructed to return for joint aspiration. Instructed to follow up with PCP to discuss discontinuing Chlorthalidone.   Final Clinical Impressions(s) / ED Diagnoses   Final diagnoses:  Acute gout of right wrist, unspecified cause    ED Discharge  Orders         Ordered    predniSONE (STERAPRED UNI-PAK 21 TAB) 10 MG (21) TBPK tablet  Daily     02/02/18 1400           Isabelle Course, MD 02/02/18 1407    Elnora Morrison, MD 02/04/18 [phone]

## 2018-02-02 NOTE — Discharge Instructions (Signed)
You most likely have gout. We will treat it with Prednisone. Please pick up your prescription and start taking it tomorrow morning with food. If your symptoms do not improve by Monday, come back and see [place]. If you experience worsening pain, fevers, chills, nausea, vomiting, diarrhea see a doctor immediately. I would also suggest following up with your regular doctor to discuss stopping Chlorthalidone as this medicine can increase your risk of having gout attacks.

## 2018-02-02 NOTE — ED Notes (Signed)
ED Provider at bedside. 

## 2018-02-07 ENCOUNTER — Telehealth: Payer: Self-pay | Admitting: Family Medicine

## 2018-02-07 LAB — COLOGUARD: COLOGUARD: NEGATIVE

## 2018-02-07 NOTE — Telephone Encounter (Unsigned)
Copied from [place] [id number]. Topic: General - Other >> Feb 07, 2018  2:29 PM Judyann Munson wrote: Reason for CRM: patient is calling to state he has stop taking chlorthalidone (HYGROTON) 50 MG tablet. He stated he was advise by his phramcy and the DR at the Er. He is requesting a call back from the nurse. Please advise

## 2018-02-08 NOTE — Telephone Encounter (Signed)
Please advise 

## 2018-02-14 NOTE — Telephone Encounter (Signed)
Please notify the patient to stop the chlorthalidone because it can cause Gout. He should follow up for an office visit at that time we can start a new medication.   Continue the lisinopril, amlodipine, metoprolol.

## 2018-02-17 NOTE — Telephone Encounter (Signed)
Left message with lady who answered phone to have pt call Hayden Kihara at his convenience. Dgaddy, CMA

## 2018-02-24 ENCOUNTER — Telehealth: Payer: Self-pay | Admitting: Family Medicine

## 2018-02-24 ENCOUNTER — Encounter (HOSPITAL_BASED_OUTPATIENT_CLINIC_OR_DEPARTMENT_OTHER): Payer: Self-pay | Admitting: *Deleted

## 2018-02-24 ENCOUNTER — Other Ambulatory Visit: Payer: Self-pay

## 2018-02-24 ENCOUNTER — Emergency Department (HOSPITAL_BASED_OUTPATIENT_CLINIC_OR_DEPARTMENT_OTHER)
Admission: EM | Admit: 2018-02-24 | Discharge: 2018-02-24 | Disposition: A | Discharge status: Home / Self Care | Payer: Medicare HMO | Attending: Emergency Medicine | Admitting: Emergency Medicine

## 2018-02-24 DIAGNOSIS — Z96653 Presence of artificial knee joint, bilateral: Secondary | ICD-10-CM | POA: Diagnosis not present

## 2018-02-24 DIAGNOSIS — Z7982 Long term (current) use of aspirin: Secondary | ICD-10-CM | POA: Diagnosis not present

## 2018-02-24 DIAGNOSIS — I5032 Chronic diastolic (congestive) heart failure: Secondary | ICD-10-CM | POA: Diagnosis not present

## 2018-02-24 DIAGNOSIS — I130 Hypertensive heart and chronic kidney disease with heart failure and stage 1 through stage 4 chronic kidney disease, or unspecified chronic kidney disease: Secondary | ICD-10-CM | POA: Insufficient documentation

## 2018-02-24 DIAGNOSIS — F17220 Nicotine dependence, chewing tobacco, uncomplicated: Secondary | ICD-10-CM | POA: Diagnosis not present

## 2018-02-24 DIAGNOSIS — N183 Chronic kidney disease, stage 3 (moderate): Secondary | ICD-10-CM | POA: Insufficient documentation

## 2018-02-24 DIAGNOSIS — R69 Illness, unspecified: Secondary | ICD-10-CM | POA: Diagnosis not present

## 2018-02-24 DIAGNOSIS — M25572 Pain in left ankle and joints of left foot: Secondary | ICD-10-CM | POA: Diagnosis present

## 2018-02-24 DIAGNOSIS — Z79899 Other long term (current) drug therapy: Secondary | ICD-10-CM | POA: Diagnosis not present

## 2018-02-24 DIAGNOSIS — M10072 Idiopathic gout, left ankle and foot: Secondary | ICD-10-CM

## 2018-02-24 DIAGNOSIS — Z7902 Long term (current) use of antithrombotics/antiplatelets: Secondary | ICD-10-CM | POA: Diagnosis not present

## 2018-02-24 MED ORDER — PREDNISONE 10 MG (21) PO TBPK: ORAL_TABLET | Freq: Every day | ORAL | 0 refills | Status: DC

## 2018-02-24 MED ORDER — PREDNISONE 20 MG PO TABS
40.0000 mg | ORAL_TABLET | Freq: Once | ORAL | Status: AC
  Administered 2018-02-24: 40 mg via ORAL
  Filled 2018-02-24: qty 2

## 2018-02-24 NOTE — Telephone Encounter (Signed)
Copied from [place] [id number]. Topic: General - Other >> Feb 24, 2018  8:42 AM Carolyn Stare wrote:  Pt said he has gout in both ankles and will not be able to come in and is asking if something can be called in for him. He said he was seen in the ER for this about a month ago and they him medicine but does not remember the name of the med

## 2018-02-24 NOTE — Telephone Encounter (Signed)
Pt seen at [facility] for ankle pain and given prednisone. See [facility]. Dgaddy, CMA

## 2018-02-24 NOTE — Telephone Encounter (Signed)
Pt called back and states that the medication that helped him before was Prednisone.  He is hoping this can be called in.  Pt states that he can not get out with the pain that he is in to see the doctor and just wants some medication called in.

## 2018-02-24 NOTE — ED Triage Notes (Signed)
Pain in both ankles. Denies injury. Ambulatory with crutches. States he has a hx of gout and feels he is having a gout flare up.

## 2018-02-24 NOTE — Discharge Instructions (Addendum)
I have given you a steroid dose pack which you will take for the gout flare. It is important for you to contact your primary care office for follow-up to discuss starting prophylactic gout medications such as allopurinol. Please limit foods in your diet including red meats, shellfish, aged cheeses, and simple sugars.

## 2018-02-24 NOTE — ED Provider Notes (Signed)
[facility] Provider Note   CSN: [id number] Arrival date & time: 02/24/18  1152     History   Chief Complaint Chief Complaint  Patient presents with  . Ankle Pain    HPI Nathan Grant is a 72 y.o. male.  Patient is presenting today with 3 days of bilateral ankle pain concerning for acute gout flare.  Patient has a PMH history significant for chronic gout, morbid obesity, HTN, CKDIIIb.  He is not currently on gout prophylaxis.  He has a history of acute gout flares and has been off chlorthalidone as recommended during his last ED visit.  Patient states pain began on his right ankle 3 days ago which was consistent with his typical gout flare.  His left ankle began bothering him yesterday morning as his right ankle was improving.  Patient denies history of trauma, fevers or chills, lower extremity swelling, chest pain or shortness of breath.     Past Medical History:  Diagnosis Date  . Atrial flutter ([place])    s/p cardioversion 06/2010  . Bradycardia    a. Accelerated junctional & sinus bradycardia during 01/2013 adm.  . Chronic headaches   . Degenerative joint disease     End-stage degenerative joint disease, left knee  . Erectile dysfunction   . HTN (hypertension)   . Hypertensive heart disease    a. Admit for CP 01/2013 felt due to this. Cath with normal coronary anatomy.  . Microcytic anemia  03/22/2001  . Morbid obesity ([place])   . NSVT (nonsustained ventricular tachycardia) ([place])    a. During 01/2013 adm.  . Obesity   . Osteoarthritis   . Tendonitis   . Tobacco chew use     History of chewing tobacco usage.   . Urinary tract infection     Pseudomonas urinary tract infection    Patient Active Problem List   Diagnosis Date Noted  . Chronic diastolic CHF (congestive heart failure) ([place]) 09/06/2014  . CKD (chronic kidney disease) stage 3, GFR 30-59 ml/min (HCC) 09/06/2014  . Hypercholesterolemia 12/21/2013  . HTN (hypertension)  01/17/2013  . NSVT (nonsustained ventricular tachycardia) ([place]) 01/17/2013  . Accelerated junctional rhythm 01/17/2013  . Sinus bradycardia 01/17/2013  . Diastolic dysfunction 93/73/4287  . Acute on chronic renal failure ([place]) 05/30/2012  . Osteoarthritis   . Hypokalemia   . Erectile dysfunction   . Chronic headaches   . Hyperlipidemia   . Morbid obesity ([place])   . Atrial flutter ([place])   . Hypertensive heart disease   . Microcytic anemia 03/22/2001    Past Surgical History:  Procedure Laterality Date  . CARDIOVERSION  07/01/2010  . LEFT HEART CATHETERIZATION WITH CORONARY ANGIOGRAM N/A 01/16/2013   Procedure: LEFT HEART CATHETERIZATION WITH CORONARY ANGIOGRAM;  Surgeon: Peter M Martinique, MD;  Location: [facility] C[facility] LAB;  Service: Cardiovascular;  Laterality: N/A;  . TOTAL KNEE ARTHROPLASTY  May 2009   Bilateral  . VASECTOMY          Home Medications    Prior to Admission medications   Medication Sig Start Date End Date Taking? Authorizing Provider  amLODipine (NORVASC) 10 MG tablet Take 1 tablet (10 mg total) by mouth daily. 09/23/17   Tereasa Coop, PA-C  aspirin EC 81 MG tablet Take 1 tablet (81 mg total) by mouth daily. 03/18/17   Tereasa Coop, PA-C  chlorthalidone (HYGROTON) 50 MG tablet TAKE 1 TABLET BY MOUTH ONCE DAILY Patient taking differently: Take 50 mg by mouth daily.  09/23/17  Tereasa Coop, PA-C  lisinopril (PRINIVIL,ZESTRIL) 20 MG tablet Take 1 tablet (20 mg total) by mouth daily. 10/08/17   Tereasa Coop, PA-C  metoprolol succinate (TOPROL-XL) 25 MG 24 hr tablet TAKE 1 TABLET BY MOUTH ONCE DAILY Patient taking differently: Take 25 mg by mouth daily.  09/23/17   Tereasa Coop, PA-C  Multiple Vitamins-Minerals (CENTRUM SILVER PO) Take 1 tablet by mouth daily.    [provider]  potassium chloride SA (K-DUR,KLOR-CON) 20 MEQ tablet Take 1 tablet (20 mEq total) by mouth daily. 10/08/17   Tereasa Coop, PA-C  pravastatin (PRAVACHOL) 40 MG tablet Take  1 tablet (40 mg total) by mouth every evening. 09/23/17   Tereasa Coop, PA-C  predniSONE (STERAPRED UNI-PAK 21 TAB) 10 MG (21) TBPK tablet Take by mouth daily. Take 4 tabs by mouth daily  for 2 days, then 3 tabs for 2 days, then 2 tabs for 2 days, then 1 tabs for 2 days 02/24/18   [place] Bing, DO  sucralfate (CARAFATE) 1 GM/10ML suspension Take 10 mLs (1 g total) by mouth 4 (four) times daily -  with meals and at bedtime. Patient not taking: Reported on 01/26/2018 12/04/17   Varney Biles, MD    Family History Family History  Problem Relation Age of Onset  . Alcohol abuse Brother   . Hypertension Brother   . Hypertension Mother     Social History Social History   Tobacco Use  . Smoking status: Never Smoker  . Smokeless tobacco: Current User    Types: Chew  Substance Use Topics  . Alcohol use: No  . Drug use: No     Allergies   Aspirin; Ibuprofen; and Tylenol [acetaminophen]   Review of Systems Review of Systems  Constitutional: Negative for chills and fever.  Respiratory: Negative for cough and shortness of breath.   Cardiovascular: Negative for chest pain and leg swelling.  Gastrointestinal: Negative for nausea and vomiting.  Genitourinary: Negative for dysuria and hematuria.  Musculoskeletal: Positive for arthralgias and joint swelling. Negative for back pain.  Skin: Negative for color change and rash.  Neurological: Negative for weakness and numbness.  All other systems reviewed and are negative.    Physical Exam Updated Vital Signs BP (!) 188/79 (BP Location: Right Arm)   Pulse 74   Temp 99.1 F (37.3 C) (Oral)   Resp 20   Ht 6\' 1"  (1.854 m)   Wt (!) 142.9 kg   SpO2 99%   BMI 41.56 kg/m   Physical Exam  Constitutional: He appears well-developed and well-nourished. No distress.  HENT:  Head: Normocephalic and atraumatic.  Eyes: Conjunctivae are normal.  Neck: Neck supple.  Cardiovascular: Normal rate and regular rhythm.  No murmur  heard. Pulmonary/Chest: Effort normal and breath sounds normal. No respiratory distress.  Abdominal: Soft. There is no tenderness.  Musculoskeletal:  ASO on left ankle removed, increased warmth and significant tenderness especially to left lateral ankle, limited ROM due to tenderness  Neurological: He is alert.  Skin: Skin is warm and dry. He is not diaphoretic.  Psychiatric: He has a normal mood and affect.  Nursing note and vitals reviewed.    ED Treatments / Results  Labs (all labs ordered are listed, but only abnormal results are displayed) Labs Reviewed - No data to display  EKG None  Radiology No results found.  Procedures Procedures (including critical care time)  Medications Ordered in ED Medications - No data to display   Initial Impression /  Assessment and Plan / ED Course  I have reviewed the triage vital signs and the nursing notes.  Pertinent labs & imaging results that were available during my care of the patient were reviewed by me and considered in my medical decision making (see chart for details).  Patient is a 72 year old male presenting with acute gout flare of bilateral ankles with the left greater than right over the last 3 days.  Left ankle with ankle swelling primarily on lateral surface with increased warmth to palpation with no overlying erythema in no proximal leg swelling.  Range of motion limited to tenderness.  Right ankle with similar presentation though less tender.  Patient given steroid taper and instructed to contact PCP to follow-up for recurring gout flares.  Discussed dietary modifications consideration for colchicine outpatient to prevent future flares.  Reviewed return precautions.  Patient stable for discharge.  Final Clinical Impressions(s) / ED Diagnoses   Final diagnoses:  Acute idiopathic gout of left ankle    ED Discharge Orders         Ordered    predniSONE (STERAPRED UNI-PAK 21 TAB) 10 MG (21) TBPK tablet  Daily      02/24/18 1316           [place] Bing, DO 02/24/18 1317    Quintella Reichert, MD 02/25/18 [phone]

## 2018-03-08 ENCOUNTER — Other Ambulatory Visit: Payer: Self-pay

## 2018-03-08 ENCOUNTER — Ambulatory Visit (INDEPENDENT_AMBULATORY_CARE_PROVIDER_SITE_OTHER): Payer: Medicare HMO | Admitting: Family Medicine

## 2018-03-08 ENCOUNTER — Encounter: Payer: Self-pay | Admitting: Family Medicine

## 2018-03-08 VITALS — BP 166/86 | HR 47 | Temp 98.3°F | Resp 17 | Ht 73.0 in | Wt 319.4 lb

## 2018-03-08 DIAGNOSIS — Z794 Long term (current) use of insulin: Secondary | ICD-10-CM | POA: Diagnosis not present

## 2018-03-08 DIAGNOSIS — I10 Essential (primary) hypertension: Secondary | ICD-10-CM

## 2018-03-08 DIAGNOSIS — E1122 Type 2 diabetes mellitus with diabetic chronic kidney disease: Secondary | ICD-10-CM

## 2018-03-08 DIAGNOSIS — G4733 Obstructive sleep apnea (adult) (pediatric): Secondary | ICD-10-CM

## 2018-03-08 DIAGNOSIS — I5032 Chronic diastolic (congestive) heart failure: Secondary | ICD-10-CM

## 2018-03-08 DIAGNOSIS — R32 Unspecified urinary incontinence: Secondary | ICD-10-CM | POA: Diagnosis not present

## 2018-03-08 DIAGNOSIS — N183 Chronic kidney disease, stage 3 unspecified: Secondary | ICD-10-CM

## 2018-03-08 DIAGNOSIS — R0600 Dyspnea, unspecified: Secondary | ICD-10-CM | POA: Diagnosis not present

## 2018-03-08 DIAGNOSIS — M1A39X0 Chronic gout due to renal impairment, multiple sites, without tophus (tophi): Secondary | ICD-10-CM

## 2018-03-08 LAB — POCT URINALYSIS DIP (MANUAL ENTRY)
BILIRUBIN UA: NEGATIVE
BILIRUBIN UA: NEGATIVE mg/dL
Blood, UA: NEGATIVE
GLUCOSE UA: NEGATIVE mg/dL
Nitrite, UA: NEGATIVE
PH UA: 7 (ref 5.0–8.0)
Protein Ur, POC: 100 mg/dL — AB
Spec Grav, UA: 1.015 (ref 1.010–1.025)
Urobilinogen, UA: 1 E.U./dL

## 2018-03-08 MED ORDER — COLCHICINE 0.6 MG PO TABS: ORAL_TABLET | ORAL | 3 refills | Status: DC

## 2018-03-08 MED ORDER — LISINOPRIL 40 MG PO TABS: 40.0000 mg | ORAL_TABLET | Freq: Every day | ORAL | 0 refills | Status: DC

## 2018-03-08 MED ORDER — FLUTICASONE PROPIONATE 50 MCG/ACT NA SUSP: 2.0000 | Freq: Every day | NASAL | 6 refills | Status: AC

## 2018-03-08 NOTE — Assessment & Plan Note (Signed)
Discussed that OSA causes worsens morbidity for heart disease and kidney disease. He is being referred for sleep study.  He could be having bed wetting as a result of his OSA that is untreated.

## 2018-03-08 NOTE — Assessment & Plan Note (Signed)
Patient is diet controlled diabetic but with chronic kidney disease. Discussed avoiding nsaids for gout. Will give colchicine. Will check uric acid levels today.

## 2018-03-08 NOTE — Assessment & Plan Note (Signed)
Will rule out UTI as a cause

## 2018-03-08 NOTE — Assessment & Plan Note (Signed)
Patient stopped chlorthalidone due to gout attacks. Will increase his lisinopril from 20mg  to 40mg  and have him track his bp. One month follow up. Will refer to Nephrology to establish care.

## 2018-03-08 NOTE — Assessment & Plan Note (Signed)
Advised pt to follow up with Dr. Martinique especially since he has not been seen for a while and is now experiencing some shortness of breath at night. Will check BNP. No signs of fluid overload on exam. Gave flonase for congestion.

## 2018-03-08 NOTE — Progress Notes (Signed)
Established Patient Office Visit  Subjective:  Patient ID: Nathan Grant, male    DOB: 1945-09-01  Age: 72 y.o. MRN: [id number]  CC:  Chief Complaint  Patient presents with  . ER follow up, gout pain in both ankles    per MD he would need to be on something to help with pain as it will reoccur.  Wheezing per pt when he lies down on his side x  6 months, makes pt have to sit up right.  Hx of sleep apnea  . Immunizations    flu shot declines    HPI Nathan Grant presents   Gout CKD  Uncontrolled Hypertension Patient has a history of hypertension and was prescribed metoprolol, lisinopril and chlorthalidone. He has a history of hypertension that was controlled but he was getting more and more episodes of gout and the ER physician suggested that he stop the diuretic.  Patient reports that he stopped the chlorthalidone due to the repeated flares of gout He states that he stopped red meat, white sugar or organ meats He has never taken colchicine for gout pain He has CKD stage He has had 3 distinct gouty attacks in the past year affecting the left great toe, right wrist and both ankles.  Uncontrolled Hypertension OSA NOT ON CPAP Chronic diastolic heart failure Enuresis Patient reports that he has been having increasing shortness of breath at night and even hears himself wheezing when he lays on the left side especially. He states that it is a nuisance so he gets up and sits up to sleep He states that he has not been able to stop the feeling that has been happening for 2 weeks He was diagnosed with OSA but does not use a CPAP He feels like the wheezing is due to congestion in his nose.  He reports that he has daytime fatigue He has not seen his heart doctor in a while  He denies frothy sputum, dizziness, bilateral edema  Bedwetting He reports that he had an episode of bedwetting last night He states that this has never happened before He denies dysuria, hematuria, frequency or  urgency, no daytime symptoms Nothing had changed He said he had no awareness   Lab Results  Component Value Date   CREATININE 1.77 (H) 01/26/2018     Past Medical History:  Diagnosis Date  . Atrial flutter ([place])    s/p cardioversion 06/2010  . Bradycardia    a. Accelerated junctional & sinus bradycardia during 01/2013 adm.  . Chronic headaches   . Degenerative joint disease     End-stage degenerative joint disease, left knee  . Erectile dysfunction   . HTN (hypertension)   . Hypertensive heart disease    a. Admit for CP 01/2013 felt due to this. Cath with normal coronary anatomy.  . Microcytic anemia  03/22/2001  . Morbid obesity ([place])   . NSVT (nonsustained ventricular tachycardia) ([place])    a. During 01/2013 adm.  . Obesity   . Osteoarthritis   . Tendonitis   . Tobacco chew use     History of chewing tobacco usage.   . Urinary tract infection     Pseudomonas urinary tract infection    Past Surgical History:  Procedure Laterality Date  . CARDIOVERSION  07/01/2010  . LEFT HEART CATHETERIZATION WITH CORONARY ANGIOGRAM N/A 01/16/2013   Procedure: LEFT HEART CATHETERIZATION WITH CORONARY ANGIOGRAM;  Surgeon: Peter M Martinique, MD;  Location: [facility] CATH LAB;  Service: Cardiovascular;  Laterality: N/A;  . TOTAL  KNEE ARTHROPLASTY  May 2009   Bilateral  . VASECTOMY      Family History  Problem Relation Age of Onset  . Alcohol abuse Brother   . Hypertension Brother   . Hypertension Mother     Social History   Socioeconomic History  . Marital status: Married    Spouse name: Not on file  . Number of children: 6  . Years of education: Not on file  . Highest education level: Not on file  Occupational History  . Occupation: Day Care    Employer: RETIRED  Social Needs  . Financial resource strain: Not on file  . Food insecurity:    Worry: Not on file    Inability: Not on file  . Transportation needs:    Medical: Not on file    Non-medical: Not on file  Tobacco Use  .  Smoking status: Never Smoker  . Smokeless tobacco: Current User    Types: Chew  Substance and Sexual Activity  . Alcohol use: No  . Drug use: No  . Sexual activity: Yes  Lifestyle  . Physical activity:    Days per week: Not on file    Minutes per session: Not on file  . Stress: Not on file  Relationships  . Social connections:    Talks on phone: Not on file    Gets together: Not on file    Attends religious service: Not on file    Active member of club or organization: Not on file    Attends meetings of clubs or organizations: Not on file    Relationship status: Not on file  . Intimate partner violence:    Fear of current or ex partner: Not on file    Emotionally abused: Not on file    Physically abused: Not on file    Forced sexual activity: Not on file  Other Topics Concern  . Not on file  Social History Narrative  . Not on file    Outpatient Medications Prior to Visit  Medication Sig Dispense Refill  . amLODipine (NORVASC) 10 MG tablet Take 1 tablet (10 mg total) by mouth daily. 90 tablet 1  . aspirin EC 81 MG tablet Take 1 tablet (81 mg total) by mouth daily. 90 tablet 1  . metoprolol succinate (TOPROL-XL) 25 MG 24 hr tablet TAKE 1 TABLET BY MOUTH ONCE DAILY (Patient taking differently: Take 25 mg by mouth daily. ) 90 tablet 1  . Multiple Vitamins-Minerals (CENTRUM SILVER PO) Take 1 tablet by mouth daily.    . potassium chloride SA (K-DUR,KLOR-CON) 20 MEQ tablet Take 1 tablet (20 mEq total) by mouth daily. 90 tablet 3  . pravastatin (PRAVACHOL) 40 MG tablet Take 1 tablet (40 mg total) by mouth every evening. 90 tablet 1  . predniSONE (STERAPRED UNI-PAK 21 TAB) 10 MG (21) TBPK tablet Take by mouth daily. Take 4 tabs by mouth daily  for 2 days, then 3 tabs for 2 days, then 2 tabs for 2 days, then 1 tabs for 2 days 21 tablet 0  . sucralfate (CARAFATE) 1 GM/10ML suspension Take 10 mLs (1 g total) by mouth 4 (four) times daily -  with meals and at bedtime. 420 mL 0  .  chlorthalidone (HYGROTON) 50 MG tablet TAKE 1 TABLET BY MOUTH ONCE DAILY (Patient taking differently: Take 50 mg by mouth daily. ) 90 tablet 1  . lisinopril (PRINIVIL,ZESTRIL) 20 MG tablet Take 1 tablet (20 mg total) by mouth daily. 90 tablet 3  No facility-administered medications prior to visit.     Allergies  Allergen Reactions  . Aspirin   . Ibuprofen   . Tylenol [Acetaminophen]     ROS Review of Systems  Constitutional: Negative for chills, fever and weight loss.  Respiratory: Positive for wheezing. Negative for cough and sputum production.   Cardiovascular: Negative for chest pain and palpitations.  Gastrointestinal: Negative for abdominal pain, diarrhea, nausea and vomiting.  Neurological: Negative for dizziness, tingling and headaches.  see HPI FOR ROS  ALL OTHER ROS NEGATIVE    Objective:    Physical Exam  BP (!) 166/86 (BP Location: Right Arm, Patient Position: Sitting, Cuff Size: Large)   Pulse (!) 47   Temp 98.3 F (36.8 C) (Oral)   Resp 17   Ht _0  (1.854 m)   Wt (!) 319 lb 6.4 oz (144.9 kg)   SpO2 98%   BMI 42.14 kg/m  Wt Readings from Last 3 Encounters:  03/08/18 (!) 319 lb 6.4 oz (144.9 kg)  02/24/18 (!) 315 lb (142.9 kg)  01/26/18 (!) 318 lb 3.2 oz (144.3 kg)   Physical Exam  Constitutional: He is oriented to person, place, and time. He appears well-developed and well-nourished.  HENT:  Head: Normocephalic and atraumatic.  Eyes: Conjunctivae and EOM are normal.  Neck: no JVD, no bruits, no thyromegaly Cardiovascular: Normal rate, regular rhythm, normal heart sounds and intact distal pulses.  No murmur heard. Pulmonary/Chest: Effort normal and breath sounds normal. No stridor. No respiratory distress. He has no wheezes. No crackles, no wheezing Abdomen: nondistended, normoactive bs, soft, nontender Extremity: no edema, no cyanosis Neurological: He is alert and oriented to person, place, and time.  Skin: Skin is warm. Capillary refill takes  less than 2 seconds.  Psychiatric: He has a normal mood and affect. His behavior is normal. Judgment and thought content normal.    Health Maintenance Due  Topic Date Due  . FOOT EXAM  06/19/1955  . OPHTHALMOLOGY EXAM  06[birth year]19/1955  . PNA vac Low Risk Adult (1 of 2 - PCV13) 06/18/2009  . INFLUENZA VACCINE  11/11/2017    There are no preventive care reminders to display for this patient.  No results found for: TSH Lab Results  Component Value Date   WBC 6.3 12/03/2017   HGB 12.2 (L) 12/03/2017   HCT 38.5 (L) 12/03/2017   MCV 72.2 (L) 12/03/2017   PLT 302 12/03/2017   Lab Results  Component Value Date   NA 144 01/26/2018   K 4.1 01/26/2018   CO2 30 (H) 01/26/2018   GLUCOSE 90 01/26/2018   BUN 23 01/26/2018   CREATININE 1.77 (H) 01/26/2018   BILITOT 0.8 12/03/2017   ALKPHOS 37 (L) 12/03/2017   AST 21 12/03/2017   ALT 15 12/03/2017   PROT 7.0 12/03/2017   ALBUMIN 4.5 01/26/2018   CALCIUM 9.6 01/26/2018   ANIONGAP 11 12/03/2017   GFR 66.34 07/07/2013   Lab Results  Component Value Date   CHOL 141 01/26/2018   Lab Results  Component Value Date   HDL 47 01/26/2018   Lab Results  Component Value Date   LDLCALC 72 01/26/2018   Lab Results  Component Value Date   TRIG 109 01/26/2018   Lab Results  Component Value Date   CHOLHDL 3.0 01/26/2018   Lab Results  Component Value Date   HGBA1C 6.3 (A) 10/08/2017      Assessment & Plan:   Problem List Items Addressed This Visit  Cardiovascular and Mediastinum   Chronic diastolic CHF (congestive heart failure) (HCC)    Advised pt to follow up with Dr. Martinique especially since he has not been seen for a while and is now experiencing some shortness of breath at night. Will check BNP. No signs of fluid overload on exam. Gave flonase for congestion.       Relevant Medications   lisinopril (PRINIVIL,ZESTRIL) 40 MG tablet   Other Relevant Orders   CMP14+EGFR   Brain natriuretic peptide   Uncontrolled  hypertension - Primary    Patient stopped chlorthalidone due to gout attacks. Will increase his lisinopril from 24m to 468mand have him track his bp. One month follow up. Will refer to Nephrology to establish care.       Relevant Medications   lisinopril (PRINIVIL,ZESTRIL) 40 MG tablet   Other Relevant Orders   Ambulatory referral to Nephrology   CMP14+EGFR   Brain natriuretic peptide     Respiratory   OSA (obstructive sleep apnea)    Discussed that OSA causes worsens morbidity for heart disease and kidney disease. He is being referred for sleep study.  He could be having bed wetting as a result of his OSA that is untreated.       Relevant Orders   CMP14+EGFR   Brain natriuretic peptide   Ambulatory referral to Sleep Studies     Endocrine   CKD stage 3 due to type 2 diabetes mellitus ([place]   Patient is diet controlled diabetic but with chronic kidney disease. Discussed avoiding nsaids for gout. Will give colchicine. Will check uric acid levels today.       Relevant Medications   lisinopril (PRINIVIL,ZESTRIL) 40 MG tablet     Genitourinary   CKD (chronic kidney disease) stage 3, GFR 30-59 ml/min (HCC)   Relevant Orders   Ambulatory referral to Nephrology     Other   PND (paroxysmal nocturnal dyspnea)   Relevant Orders   CMP14+EGFR   Brain natriuretic peptide   CBC   POCT urinalysis dipstick (Completed)   Hemoglobin A1c   Enuresis    Will rule out UTI as a cause      Relevant Orders   Brain natriuretic peptide   POCT urinalysis dipstick (Completed)   Chronic gout due to renal impairment of multiple sites without tophus    Will check uric acid levels now that gout flare has passed.      Relevant Medications   colchicine 0.6 MG tablet   Other Relevant Orders   Uric Acid   CMP14+EGFR      Meds ordered this encounter  Medications  . colchicine 0.6 MG tablet    Sig: Take 0.90m63mhree times a day on day one of gout flare then after that take 0.90mg590m mouth  twice daily until gout flare stops.    Dispense:  30 tablet    Refill:  3  . lisinopril (PRINIVIL,ZESTRIL) 40 MG tablet    Sig: Take 1 tablet (40 mg total) by mouth daily.    Dispense:  90 tablet    Refill:  0  . fluticasone (FLONASE) 50 MCG/ACT nasal spray    Sig: Place 2 sprays into both nostrils daily.    Dispense:  16 g    Refill:  6    Follow-up: Return in about 4 weeks (around 04/05/2018) for hypertension.    Torrie Namba Forrest Moron

## 2018-03-08 NOTE — Assessment & Plan Note (Signed)
Will check uric acid levels now that gout flare has passed.

## 2018-03-08 NOTE — Patient Instructions (Addendum)
Martinique, Peter M, MD Consulting Physician Cardiology 05/29/2012 03/17/2017  03/18/17  Phone: [phone]; Fax: [phone]     1. Contact Dr. Peter Martinique MD [facility] follow up  2.  I have increased your lisinopril to 40mg  since you are no longer on the chlorthalidone. I have sent in a refill to the pharmacy for you.  3.  Follow up with sleep study at the sleep clinic.   4. For your kidneys please follow up with the referral. You will be contacted in 3 business days.   5. Gout, treatment (acute flares): Note: Treatment should be initiated as soon as possible at the first sign of flare, but within 24 hours (preferably) to 36 hours of flare onset  Day 1:  0.6 mg 3 times daily on the first day of flare; maximum total dose: 1.8 mg on day 1  Day 2 and thereafter: Oral: 0.6 mg twice daily until flare resolves    6. STOP TAKING YOUR CHOLESTEROL MEDICATION WHILE TAKING MEDICATION FOR GOUT.  7. Follow up at  Your appointment 04/28/2018 for blood pressure visit   If you have lab work done today you will be contacted with your lab results within the next 2 weeks.  If you have not heard from [place] then please contact us. The fastest way to get your results is to register for My Chart.   IF you received an x-ray today, you will receive an invoice from [facility]. Please contact [facility] at [phone] with questions or concerns regarding your invoice.   IF you received labwork today, you will receive an invoice from [place]. Please contact [facility] at [phone] with questions or concerns regarding your invoice.   Our billing staff will not be able to assist you with questions regarding bills from these companies.  You will be contacted with the lab results as soon as they are available. The fastest way to get your results is to activate your My Chart account. Instructions are located on the last page of this paperwork. If you have not heard from [place] regarding the  results in 2 weeks, please contact this office.

## 2018-03-09 LAB — CMP14+EGFR
ALK PHOS: 50 IU/L (ref 39–117)
ALT: 13 IU/L (ref 0–44)
AST: 15 IU/L (ref 0–40)
Albumin/Globulin Ratio: 1.6 (ref 1.2–2.2)
Albumin: 3.9 g/dL (ref 3.5–4.8)
BUN/Creatinine Ratio: 15 (ref 10–24)
BUN: 22 mg/dL (ref 8–27)
Bilirubin Total: 0.5 mg/dL (ref 0.0–1.2)
CALCIUM: 9 mg/dL (ref 8.6–10.2)
CO2: 26 mmol/L (ref 20–29)
CREATININE: 1.47 mg/dL — AB (ref 0.76–1.27)
Chloride: 101 mmol/L (ref 96–106)
GFR calc Af Amer: 54 mL/min/{1.73_m2} — ABNORMAL LOW (ref >59)
GFR, EST NON AFRICAN AMERICAN: 47 mL/min/{1.73_m2} — AB (ref >59)
GLUCOSE: 96 mg/dL (ref 65–99)
Globulin, Total: 2.5 g/dL (ref 1.5–4.5)
Potassium: 4.5 mmol/L (ref 3.5–5.2)
Sodium: 142 mmol/L (ref 134–144)
TOTAL PROTEIN: 6.4 g/dL (ref 6.0–8.5)

## 2018-03-09 LAB — CBC
HEMATOCRIT: 36.3 % — AB (ref 37.5–51.0)
HEMOGLOBIN: 11.5 g/dL — AB (ref 13.0–17.7)
MCH: 22.8 pg — ABNORMAL LOW (ref 26.6–33.0)
MCHC: 31.7 g/dL (ref 31.5–35.7)
MCV: 72 fL — ABNORMAL LOW (ref 79–97)
Platelets: 405 10*3/uL (ref 150–450)
RBC: 5.05 x10E6/uL (ref 4.14–5.80)
RDW: 15.8 % — AB (ref 12.3–15.4)
WBC: 5.3 10*3/uL (ref 3.4–10.8)

## 2018-03-09 LAB — URIC ACID: Uric Acid: 8 mg/dL (ref 3.7–8.6)

## 2018-03-09 LAB — BRAIN NATRIURETIC PEPTIDE: BNP: 190.9 pg/mL — AB (ref 0.0–100.0)

## 2018-03-09 LAB — HEMOGLOBIN A1C
Est. average glucose Bld gHb Est-mCnc: 131 mg/dL
HEMOGLOBIN A1C: 6.2 % — AB (ref 4.8–5.6)

## 2018-03-10 ENCOUNTER — Other Ambulatory Visit: Payer: Self-pay | Admitting: Family Medicine

## 2018-03-10 DIAGNOSIS — E790 Hyperuricemia without signs of inflammatory arthritis and tophaceous disease: Secondary | ICD-10-CM

## 2018-03-10 DIAGNOSIS — M1A39X0 Chronic gout due to renal impairment, multiple sites, without tophus (tophi): Secondary | ICD-10-CM

## 2018-03-10 MED ORDER — FUROSEMIDE 20 MG PO TABS: ORAL_TABLET | ORAL | 0 refills | Status: DC

## 2018-03-10 MED ORDER — ALLOPURINOL 100 MG PO TABS: 100.0000 mg | ORAL_TABLET | Freq: Every day | ORAL | 6 refills | Status: DC

## 2018-03-12 ENCOUNTER — Telehealth: Payer: Self-pay

## 2018-03-12 NOTE — Telephone Encounter (Signed)
[place]  Fax [phone] req clarification. Directions on Furosemide 20mg  tablet - #30, RF-0 Sig take one tablet per day x 5 days.  Please clarify for pharmacy.  Thank you

## 2018-03-15 ENCOUNTER — Other Ambulatory Visit: Payer: Self-pay | Admitting: Physician Assistant

## 2018-03-15 DIAGNOSIS — Z13220 Encounter for screening for lipoid disorders: Secondary | ICD-10-CM

## 2018-03-15 DIAGNOSIS — I5032 Chronic diastolic (congestive) heart failure: Secondary | ICD-10-CM

## 2018-03-16 ENCOUNTER — Ambulatory Visit: Payer: Self-pay

## 2018-03-16 NOTE — Telephone Encounter (Signed)
Returned call to patient. Pt is requesting information about taking his medications. His Question is can he take amlodipine pravastatin allopurinol and colchicine together. Pt was advised to call his pharmacist for this information. Pt verbalized understanding and states he will call pharmacist. Reason for Disposition . Caller has URGENT medication question about med that PCP prescribed and triager unable to answer question  Protocols used: MEDICATION QUESTION CALL-A-AH

## 2018-03-16 NOTE — Telephone Encounter (Addendum)
Call placed to patient. No answer. Unable to leave message. Pt calling with question about gout medication

## 2018-04-21 NOTE — Telephone Encounter (Signed)
Can we ask Nathan Grant to give [place] some of his blood pressure readings. If he is not checking it at home please ask him to stop at a pharmacy and check it a few times before his upcoming appointment.

## 2018-04-28 ENCOUNTER — Ambulatory Visit (INDEPENDENT_AMBULATORY_CARE_PROVIDER_SITE_OTHER): Payer: Medicare HMO | Admitting: Family Medicine

## 2018-04-28 ENCOUNTER — Other Ambulatory Visit: Payer: Self-pay

## 2018-04-28 ENCOUNTER — Encounter: Payer: Self-pay | Admitting: Family Medicine

## 2018-04-28 VITALS — BP 142/90 | HR 45 | Temp 98.7°F | Resp 17 | Ht 73.0 in | Wt 319.8 lb

## 2018-04-28 DIAGNOSIS — Z794 Long term (current) use of insulin: Secondary | ICD-10-CM | POA: Diagnosis not present

## 2018-04-28 DIAGNOSIS — I5032 Chronic diastolic (congestive) heart failure: Secondary | ICD-10-CM | POA: Diagnosis not present

## 2018-04-28 DIAGNOSIS — L84 Corns and callosities: Secondary | ICD-10-CM

## 2018-04-28 DIAGNOSIS — N183 Chronic kidney disease, stage 3 unspecified: Secondary | ICD-10-CM

## 2018-04-28 DIAGNOSIS — E790 Hyperuricemia without signs of inflammatory arthritis and tophaceous disease: Secondary | ICD-10-CM | POA: Diagnosis not present

## 2018-04-28 DIAGNOSIS — E1122 Type 2 diabetes mellitus with diabetic chronic kidney disease: Secondary | ICD-10-CM | POA: Diagnosis not present

## 2018-04-28 DIAGNOSIS — M1A39X0 Chronic gout due to renal impairment, multiple sites, without tophus (tophi): Secondary | ICD-10-CM | POA: Diagnosis not present

## 2018-04-28 DIAGNOSIS — L603 Nail dystrophy: Secondary | ICD-10-CM | POA: Diagnosis not present

## 2018-04-28 DIAGNOSIS — R0600 Dyspnea, unspecified: Secondary | ICD-10-CM | POA: Diagnosis not present

## 2018-04-28 DIAGNOSIS — E782 Mixed hyperlipidemia: Secondary | ICD-10-CM | POA: Diagnosis not present

## 2018-04-28 DIAGNOSIS — D649 Anemia, unspecified: Secondary | ICD-10-CM | POA: Diagnosis not present

## 2018-04-28 DIAGNOSIS — G4733 Obstructive sleep apnea (adult) (pediatric): Secondary | ICD-10-CM

## 2018-04-28 NOTE — Assessment & Plan Note (Signed)
Discussed his diagnosis of diabetes Provided counseling on pneumonia vaccination which patient declined. Gave information to read about the importance of pneumonia immunization especially with patients with heart disease and diabetes.

## 2018-04-28 NOTE — Assessment & Plan Note (Signed)
Replaced for Podiatry to help with callous and diabetic foot care

## 2018-04-28 NOTE — Assessment & Plan Note (Signed)
Does not use CPAP. Pt has clear evidence for OSA needed cpap. Will follow up with sleep study.

## 2018-04-28 NOTE — Patient Instructions (Signed)
Pneumococcal Conjugate Vaccine (PCV13): What You Need to Know 1. Why get vaccinated? Vaccination can protect both children and adults from pneumococcal disease. Pneumococcal disease is caused by bacteria that can spread from person to person through close contact. It can cause ear infections, and it can also lead to more serious infections of the:  Lungs (pneumonia),  Blood (bacteremia), and  Covering of the brain and spinal cord (meningitis). Pneumococcal pneumonia is most common among adults. Pneumococcal meningitis can cause deafness and brain damage, and it kills about 1 child in 10 who get it. Anyone can get pneumococcal disease, but children under 75 years of age and adults 26 years and older, people with certain medical conditions, and cigarette smokers are at the highest risk. Before there was a vaccine, the [place] saw:  more than 700 cases of meningitis,  about 13,000 blood infections,  about 5 million ear infections, and  about 200 deaths in children under 5 each year from pneumococcal disease. Since vaccine became available, severe pneumococcal disease in these children has fallen by 88%. About 18,000 older adults die of pneumococcal disease each year in the [place]. Treatment of pneumococcal infections with penicillin and other drugs is not as effective as it used to be, because some strains of the disease have become resistant to these drugs. This makes prevention of the disease, through vaccination, even more important. 2. PCV13 vaccine Pneumococcal conjugate vaccine (called PCV13) protects against 13 types of pneumococcal bacteria. PCV13 is routinely given to children at 2, 4, 6, and 52-53 months of age. It is also recommended for children and adults 74 to 31 years of age with certain health conditions, and for all adults 73 years of age and older. Your doctor can give you details. 3. Some people should not get this vaccine Anyone who has ever had a  life-threatening allergic reaction to a dose of this vaccine, to an earlier pneumococcal vaccine called PCV7, or to any vaccine containing diphtheria toxoid (for example, DTaP), should not get PCV13. Anyone with a severe allergy to any component of PCV13 should not get the vaccine. Tell your doctor if the person being vaccinated has any severe allergies. If the person scheduled for vaccination is not feeling well, your healthcare provider might decide to reschedule the shot on another day. 4. Risks of a vaccine reaction With any medicine, including vaccines, there is a chance of reactions. These are usually mild and go away on their own, but serious reactions are also possible. Problems reported following PCV13 varied by age and dose in the series. The most common problems reported among children were:  About half became drowsy after the shot, had a temporary loss of appetite, or had redness or tenderness where the shot was given.  About 1 out of 3 had swelling where the shot was given.  About 1 out of 3 had a mild fever, and about 1 in 20 had a fever over 102.30F.  Up to about 8 out of 10 became fussy or irritable. Adults have reported pain, redness, and swelling where the shot was given; also mild fever, fatigue, headache, chills, or muscle pain. Young children who get PCV13 along with inactivated flu vaccine at the same time may be at increased risk for seizures caused by fever. Ask your doctor for more information. Problems that could happen after any vaccine:  People sometimes faint after a medical procedure, including vaccination. Sitting or lying down for about 15 minutes can help prevent fainting, and injuries  caused by a fall. Tell your doctor if you feel dizzy, or have vision changes or ringing in the ears.  Some older children and adults get severe pain in the shoulder and have difficulty moving the arm where a shot was given. This happens very rarely.  Any medication can cause a  severe allergic reaction. Such reactions from a vaccine are very rare, estimated at about 1 in a million doses, and would happen within a few minutes to a few hours after the vaccination. As with any medicine, there is a very small chance of a vaccine causing a serious injury or death. The safety of vaccines is always being monitored. For more information, visit: http://www.aguilar.org/ 5. What if there is a serious reaction? What should I look for?  Look for anything that concerns you, such as signs of a severe allergic reaction, very high fever, or unusual behavior. Signs of a severe allergic reaction can include hives, swelling of the face and throat, difficulty breathing, a fast heartbeat, dizziness, and weakness-usually within a few minutes to a few hours after the vaccination. What should I do?  If you think it is a severe allergic reaction or other emergency that can't wait, call 9-1-1 or get the person to the nearest hospital. Otherwise, call your doctor. Reactions should be reported to the Vaccine Adverse Event Reporting System (VAERS). Your doctor should file this report, or you can do it yourself through the VAERS web site at www.vaers.SamedayNews.es, or by calling 863-248-8538. VAERS does not give medical advice. 6. The National Vaccine Injury Compensation Program The [employer] Vaccine Injury Compensation Program (VICP) is a federal program that was created to compensate people who may have been injured by certain vaccines. Persons who believe they may have been injured by a vaccine can learn about the program and about filing a claim by calling (520) 177-6336 or visiting the [employer] website at GoldCloset.com.ee. There is a time limit to file a claim for compensation. 7. How can I learn more?  Ask your healthcare provider. He or she can give you the vaccine package insert or suggest other sources of information.  Call your local or state health department.  Contact the  Centers for Disease Control and Prevention (CDC): ? Call 717-296-5691 (1-800-CDC-INFO) or ? Visit CDC's website at http://hunter.com/ Vaccine Information Statement PCV13 Vaccine (02/15/2014) This information is not intended to replace advice given to you by your health care provider. Make sure you discuss any questions you have with your health care provider. Document Released: 01/25/2006 Document Revised: 11/09/2017 Document Reviewed: 11/09/2017 Elsevier Interactive Patient Education  2019 [employer] Pneumococcal Polysaccharide Vaccine: What You Need to Know 1. Why get vaccinated? Vaccination can protect older adults (and some children and younger adults) from pneumococcal disease. Pneumococcal disease is caused by bacteria that can spread from person to person through close contact. It can cause ear infections, and it can also lead to more serious infections of the:  Lungs (pneumonia),  Blood (bacteremia), and  Covering of the brain and spinal cord (meningitis). Meningitis can cause deafness and brain damage, and it can be fatal. Anyone can get pneumococcal disease, but children under 34 years of age, people with certain medical conditions, adults over 31 years of age, and cigarette smokers are at the highest risk. About 18,000 older adults die each year from pneumococcal disease in the [place]. Treatment of pneumococcal infections with penicillin and other drugs used to be more effective. But some strains of the disease have become resistant to  these drugs. This makes prevention of the disease, through vaccination, even more important. 2. Pneumococcal polysaccharide vaccine (PPSV23) Pneumococcal polysaccharide vaccine (PPSV23) protects against 23 types of pneumococcal bacteria. It will not prevent all pneumococcal disease. PPSV23 is recommended for:  All adults 43 years of age and older,  Anyone 2 through 74 years of age with certain long-term health problems,  Anyone 2  through 74 years of age with a weakened immune system,  Adults 65 through 74 years of age who smoke cigarettes or have asthma. Most people need only one dose of PPSV. A second dose is recommended for certain high-risk groups. People 8 and older should get a dose even if they have gotten one or more doses of the vaccine before they turned 65. Your healthcare provider can give you more information about these recommendations. Most healthy adults develop protection within 2 to 3 weeks of getting the shot. 3. Some people should not get this vaccine  Anyone who has had a life-threatening allergic reaction to PPSV should not get another dose.  Anyone who has a severe allergy to any component of PPSV should not receive it. Tell your provider if you have any severe allergies.  Anyone who is moderately or severely ill when the shot is scheduled may be asked to wait until they recover before getting the vaccine. Someone with a mild illness can usually be vaccinated.  Children less than 60 years of age should not receive this vaccine.  There is no evidence that PPSV is harmful to either a pregnant woman or to her fetus. However, as a precaution, women who need the vaccine should be vaccinated before becoming pregnant, if possible. 4. Risks of a vaccine reaction With any medicine, including vaccines, there is a chance of side effects. These are usually mild and go away on their own, but serious reactions are also possible. About half of people who get PPSV have mild side effects, such as redness or pain where the shot is given, which go away within about two days. Less than 1 out of 100 people develop a fever, muscle aches, or more severe local reactions. Problems that could happen after any vaccine:  People sometimes faint after a medical procedure, including vaccination. Sitting or lying down for about 15 minutes can help prevent fainting, and injuries caused by a fall. Tell your doctor if you feel  dizzy, or have vision changes or ringing in the ears.  Some people get severe pain in the shoulder and have difficulty moving the arm where a shot was given. This happens very rarely.  Any medication can cause a severe allergic reaction. Such reactions from a vaccine are very rare, estimated at about 1 in a million doses, and would happen within a few minutes to a few hours after the vaccination. As with any medicine, there is a very remote chance of a vaccine causing a serious injury or death. The safety of vaccines is always being monitored. For more information, visit: http://www.aguilar.org/ 5. What if there is a serious reaction? What should I look for? Look for anything that concerns you, such as signs of a severe allergic reaction, very high fever, or unusual behavior. Signs of a severe allergic reaction can include hives, swelling of the face and throat, difficulty breathing, a fast heartbeat, dizziness, and weakness. These would usually start a few minutes to a few hours after the vaccination. What should I do? If you think it is a severe allergic reaction or other emergency that  can't wait, call 9-1-1 or get to the nearest hospital. Otherwise, call your doctor. Afterward, the reaction should be reported to the Vaccine Adverse Event Reporting System (VAERS). Your doctor might file this report, or you can do it yourself through the VAERS web site at www.vaers.SamedayNews.es, or by calling (334) 783-2552. VAERS does not give medical advice. 6. How can I learn more?  Ask your doctor. He or she can give you the vaccine package insert or suggest other sources of information.  Call your local or state health department.  Contact the Centers for Disease Control and Prevention (CDC): ? Call 934-823-4659 (1-800-CDC-INFO) or ? Visit CDC's website at http://hunter.com/ CDC Vaccine Information Statement PPSV Vaccine (08/04/2013) This information is not intended to replace advice given to you by  your health care provider. Make sure you discuss any questions you have with your health care provider. Document Released: 01/25/2006 Document Revised: 11/09/2017 Document Reviewed: 11/09/2017 Elsevier Interactive Patient Education  2019 [employer].

## 2018-04-28 NOTE — Assessment & Plan Note (Signed)
Improved on allupurinol, will reassess renal function

## 2018-04-28 NOTE — Assessment & Plan Note (Signed)
Could be due to heart failure or OSA. He is going for sleep study. Most likely due to OSA.

## 2018-04-28 NOTE — Progress Notes (Signed)
Established Patient Office Visit  Subjective:  Patient ID: Nathan Grant, male    DOB: Nov 15, 1945  Age: 73 y.o. MRN: [id number]  CC:  Chief Complaint  Patient presents with  . medicare wellness exam    HPI Nathan Grant presents for follow up for chronic disease  Diabetes Mellitus: Patient presents for follow up of diabetes. Symptoms: none. Symptoms have stabilized. Patient denies foot ulcerations, hyperglycemia, hypoglycemia , increase appetite, nausea and paresthesia of the feet.  Evaluation to date has been included: hemoglobin A1C.  Home sugars: patient does not check sugars. Treatment to date: more intensive attention to diet which has been effective.   Patient was not aware he had diabetes His levels in 2018 showed a1c 6.5% and was advised to use diet He has never had flu vaccine  ACE inhibitor: lisinopril 61m Eye: referral placed, wears glasses Foot: referral placed for Podiatry Statin: Pravachol  Aspirin: takes 843mASA  Gout: Patient here for evaluation of acute gouty arthritis. The patient reports no acute gout attacks since last clinic visit. Patient reports his chronic pain is improved, his joint stiffness is improved and his joint swelling is improved. Limitation on activities include none. The patient is avoiding high purine foods and reports consuming 0 alcoholic drinks per day. Pt on allopurinol without side effects Reports that he is doing well and has not had any episodes since starting allopurinol  PND and OSA  He gets SOB when laying on his side to go to sleep  He will be getting sleep study January 21 He does not use CPAP His blood pressures have been getting better and he feels less fatigue. He states that he gets balanced nutrition but has never used a CPAP. He admits that sometimes he cannot breathe and has to sit up in bed.  Wt Readings from Last 3 Encounters:  04/28/18 (!) 319 lb 12.8 oz (145.1 kg)  03/08/18 (!) 319 lb 6.4 oz (144.9 kg)   02/24/18 (!) 315 lb (142.9 kg)    CHF and hypertension  He active at home  His wife helps him with his diet which is low sodium He climbs steps at home but does not exercise He reports that he is less swollen in the legs since taking the 5 days of furosemide He has a history of diastolic heart failure  BP Readings from Last 3 Encounters:  04/28/18 (!) 142/90  03/08/18 (!) 166/86  02/24/18 (!) 188/79   He is now on lisinopril [id number] was started December 2019 He is also taking metoprolol  And amlodipine     Past Medical History:  Diagnosis Date  . Atrial flutter ([facility]  s/p cardioversion 06/2010  . Bradycardia    a. Accelerated junctional & sinus bradycardia during 01/2013 adm.  . Chronic headaches   . Degenerative joint disease     End-stage degenerative joint disease, left knee  . Erectile dysfunction   . HTN (hypertension)   . Hypertensive heart disease    a. Admit for CP 01/2013 felt due to this. Cath with normal coronary anatomy.  . Microcytic anemia  03/22/2001  . Morbid [place] ([facility] . NS[place] ([place]onsustained ventricular tachycardia) [place]  a. During 01/2013 adm.  . Obesity   . Osteoarthritis   . Tendonitis   . Tobacco chew use     History of chewing tobacco usage.   . Urinary tract infection     Pseudomonas urinary tract infection    Past Surgical History:  Procedure Laterality Date  . CARDIOVERSION  07/01/2010  . LEFT HEART CATHETERIZATION WITH CORONARY ANGIOGRAM N/A 01/16/2013   Procedure: LEFT HEART CATHETERIZATION WITH CORONARY ANGIOGRAM;  Surgeon: Peter M Martinique, MD;  Location: [facility] CATH LAB;  Service: Cardiovascular;  Laterality: N/A;  . TOTAL KNEE ARTHROPLASTY  May 2009   Bilateral  . VASECTOMY      Family History  Problem Relation Age of Onset  . Alcohol abuse Brother   . Hypertension Brother   . Hypertension Mother     Social History   Socioeconomic History  . Marital status: Married    Spouse name: Not on file  . Number of  children: 6  . Years of education: Not on file  . Highest education level: Not on file  Occupational History  . Occupation: Day Care    Employer: RETIRED  Social Needs  . Financial resource strain: Not on file  . Food insecurity:    Worry: Not on file    Inability: Not on file  . Transportation needs:    Medical: Not on file    Non-medical: Not on file  Tobacco Use  . Smoking status: Never Smoker  . Smokeless tobacco: Current User    Types: Chew  Substance and Sexual Activity  . Alcohol use: No  . Drug use: No  . Sexual activity: Yes  Lifestyle  . Physical activity:    Days per week: Not on file    Minutes per session: Not on file  . Stress: Not on file  Relationships  . Social connections:    Talks on phone: Not on file    Gets together: Not on file    Attends religious service: Not on file    Active member of club or organization: Not on file    Attends meetings of clubs or organizations: Not on file    Relationship status: Not on file  . Intimate partner violence:    Fear of current or ex partner: Not on file    Emotionally abused: Not on file    Physically abused: Not on file    Forced sexual activity: Not on file  Other Topics Concern  . Not on file  Social History Narrative  . Not on file    Outpatient Medications Prior to Visit  Medication Sig Dispense Refill  . allopurinol (ZYLOPRIM) 100 MG tablet Take 1 tablet (100 mg total) by mouth daily. 30 tablet 6  . amLODipine (NORVASC) 10 MG tablet TAKE 1 TABLET BY MOUTH ONCE DAILY 90 tablet 1  . aspirin EC 81 MG tablet Take 1 tablet (81 mg total) by mouth daily. 90 tablet 1  . colchicine 0.6 MG tablet Take 0.38m three times a day on day one of gout flare then after that take 0.69mby mouth twice daily until gout flare stops. 30 tablet 3  . fluticasone (FLONASE) 50 MCG/ACT nasal spray Place 2 sprays into both nostrils daily. 16 g 6  . lisinopril (PRINIVIL,ZESTRIL) 40 MG tablet Take 1 tablet (40 mg total) by mouth  daily. 90 tablet 0  . metoprolol succinate (TOPROL-XL) 25 MG 24 hr tablet TAKE 1 TABLET BY MOUTH ONCE DAILY (Patient taking differently: Take 25 mg by mouth daily. ) 90 tablet 1  . Multiple Vitamins-Minerals (CENTRUM SILVER PO) Take 1 tablet by mouth daily.    . potassium chloride SA (K-DUR,KLOR-CON) 20 MEQ tablet Take 1 tablet (20 mEq total) by mouth daily. 90 tablet 3  . pravastatin (PRAVACHOL) 40 MG tablet TAKE  1 TABLET BY MOUTH ONCE DAILY IN THE EVENING 90 tablet 1  . sucralfate (CARAFATE) 1 GM/10ML suspension Take 10 mLs (1 g total) by mouth 4 (four) times daily -  with meals and at bedtime. 420 mL 0  . furosemide (LASIX) 20 MG tablet Take one tablet by mouth for 5 days. 30 tablet 0  . predniSONE (STERAPRED UNI-PAK 21 TAB) 10 MG (21) TBPK tablet Take by mouth daily. Take 4 tabs by mouth daily  for 2 days, then 3 tabs for 2 days, then 2 tabs for 2 days, then 1 tabs for 2 days 21 tablet 0   No facility-administered medications prior to visit.     Allergies  Allergen Reactions  . Aspirin   . Ibuprofen   . Tylenol [Acetaminophen]     ROS Review of Systems Review of Systems  Constitutional: Negative for activity change, appetite change, chills and fever.  HENT: Negative for congestion, nosebleeds, trouble swallowing and voice change.   Respiratory: Negative for cough, shortness of breath and wheezing.   Gastrointestinal: Negative for diarrhea, nausea and vomiting.  Genitourinary: Negative for difficulty urinating, dysuria, flank pain and hematuria.  Musculoskeletal: Negative for back pain, joint swelling and neck pain.  Neurological: Negative for dizziness, speech difficulty, light-headedness and numbness.  See HPI. All other review of systems negative.     Objective:    Physical Exam  BP (!) 142/90 (BP Location: Left Arm, Patient Position: Sitting, Cuff Size: Large)   Pulse (!) 45   Temp 98.7 F (37.1 C) (Oral)   Resp 17   Ht 6' 1"  (1.854 m)   Wt (!) 319 lb 12.8 oz (145.1  kg)   SpO2 98%   BMI 42.19 kg/m  Wt Readings from Last 3 Encounters:  04/28/18 (!) 319 lb 12.8 oz (145.1 kg)  03/08/18 (!) 319 lb 6.4 oz (144.9 kg)  02/24/18 (!) 315 lb (142.9 kg)   Physical Exam  Constitutional: Oriented to person, place, and time. Appears well-developed and well-nourished. Morbidly obese appearing.  HENT:  Head: Normocephalic and atraumatic.  Eyes: Conjunctivae and EOM are normal.  Neck: no JVD, neck supple Cardiovascular: Normal rate, regular rhythm, normal heart sounds and intact distal pulses.  No murmur heard. Pulmonary/Chest: Effort normal and breath sounds normal. No stridor. No respiratory distress. Has no wheezes.  Neurological: Is alert and oriented to person, place, and time.  Skin: Skin is warm. Capillary refill takes less than 2 seconds.  Psychiatric: Has a normal mood and affect. Behavior is normal. Judgment and thought content normal.  Foot exam - callouses and dry skin, thick gray decaying toenails, good pulses, monofilament exam intact, cap refill <3s  Health Maintenance Due  Topic Date Due  . FOOT EXAM  06/19/1954  . OPHTHALMOLOGY EXAM  06/19/1954  . PNA vac Low Risk Adult (1 of 2 - PCV13) 06/18/2009    There are no preventive care reminders to display for this patient.  No results found for: TSH Lab Results  Component Value Date   WBC 5.3 03/08/2018   HGB 11.5 (L) 03/08/2018   HCT 36.3 (L) 03/08/2018   MCV 72 (L) 03/08/2018   PLT 405 03/08/2018   Lab Results  Component Value Date   NA 142 03/08/2018   K 4.5 03/08/2018   CO2 26 03/08/2018   GLUCOSE 96 03/08/2018   BUN 22 03/08/2018   CREATININE 1.47 (H) 03/08/2018   BILITOT 0.5 03/08/2018   ALKPHOS 50 03/08/2018   AST 15 03/08/2018  ALT 13 03/08/2018   PROT 6.4 03/08/2018   ALBUMIN 3.9 03/08/2018   CALCIUM 9.0 03/08/2018   ANIONGAP 11 12/03/2017   GFR 66.34 07/07/2013   Lab Results  Component Value Date   CHOL 141 01/26/2018   Lab Results  Component Value Date   HDL  47 01/26/2018   Lab Results  Component Value Date   LDLCALC 72 01/26/2018   Lab Results  Component Value Date   TRIG 109 01/26/2018   Lab Results  Component Value Date   CHOLHDL 3.0 01/26/2018   Lab Results  Component Value Date   HGBA1C 6.2 (H) 03/08/2018      Assessment & Plan:   Problem List Items Addressed This Visit      Cardiovascular and Mediastinum   Chronic diastolic CHF (congestive heart failure) ([place])    Patient compliant with meds, no evidence of exacerbation. Advised DASH diet and adding exercise.      Relevant Orders   CMP14+EGFR   Brain natriuretic peptide     Respiratory   OSA (obstructive sleep apnea)    Does not use CPAP. Pt has clear evidence for OSA needed cpap. Will follow up with sleep study.         Endocrine   Type 2 diabetes mellitus with stage 3 chronic kidney disease, with long-term current use of insulin ([place])    Discussed his diagnosis of diabetes Provided counseling on pneumonia vaccination which patient declined. Gave information to read about the importance of pneumonia immunization especially with patients with heart disease and diabetes.       Relevant Orders   Ambulatory referral to Ophthalmology   CMP14+EGFR   HM Diabetes Foot Exam (Completed)   Ambulatory referral to Podiatry     Musculoskeletal and Integument   Onychodystrophy     Other   Morbid obesity ([place]) (Chronic)   Hyperlipidemia (Chronic)   Relevant Orders   CMP14+EGFR   PND (paroxysmal nocturnal dyspnea)    Could be due to heart failure or OSA. He is going for sleep study. Most likely due to OSA.       Chronic gout due to renal impairment of multiple sites without tophus - Primary    Improved on allupurinol, will reassess renal function      Relevant Orders   CMP14+EGFR   Uricacidemia   Foot callus    Replaced for Podiatry to help with callous and diabetic foot care      Relevant Orders   Ambulatory referral to Podiatry    Other Visit Diagnoses     Mild anemia       Relevant Orders   CBC with Differential      No orders of the defined types were placed in this encounter.   Follow-up: Return in about 3 months (around 07/28/2018).    Forrest Moron, MD

## 2018-04-28 NOTE — Assessment & Plan Note (Signed)
Patient compliant with meds, no evidence of exacerbation. Advised DASH diet and adding exercise.

## 2018-04-29 LAB — CBC WITH DIFFERENTIAL/PLATELET
Basophils Absolute: 0 10*3/uL (ref 0.0–0.2)
Basos: 1 %
EOS (ABSOLUTE): 0.2 10*3/uL (ref 0.0–0.4)
Eos: 5 %
Hematocrit: 36.5 % — ABNORMAL LOW (ref 37.5–51.0)
Hemoglobin: 11.6 g/dL — ABNORMAL LOW (ref 13.0–17.7)
Immature Grans (Abs): 0 10*3/uL (ref 0.0–0.1)
Immature Granulocytes: 0 %
LYMPHS ABS: 1.6 10*3/uL (ref 0.7–3.1)
Lymphs: 37 %
MCH: 22.5 pg — ABNORMAL LOW (ref 26.6–33.0)
MCHC: 31.8 g/dL (ref 31.5–35.7)
MCV: 71 fL — ABNORMAL LOW (ref 79–97)
Monocytes Absolute: 0.4 10*3/uL (ref 0.1–0.9)
Monocytes: 9 %
Neutrophils Absolute: 2.1 10*3/uL (ref 1.4–7.0)
Neutrophils: 48 %
Platelets: 307 10*3/uL (ref 150–450)
RBC: 5.15 x10E6/uL (ref 4.14–5.80)
RDW: 16 % — AB (ref 11.6–15.4)
WBC: 4.3 10*3/uL (ref 3.4–10.8)

## 2018-04-29 LAB — CMP14+EGFR
ALT: 14 IU/L (ref 0–44)
AST: 22 IU/L (ref 0–40)
Albumin/Globulin Ratio: 1.6 (ref 1.2–2.2)
Albumin: 4.1 g/dL (ref 3.5–4.8)
Alkaline Phosphatase: 50 IU/L (ref 39–117)
BUN/Creatinine Ratio: 12 (ref 10–24)
BUN: 17 mg/dL (ref 8–27)
Bilirubin Total: 0.4 mg/dL (ref 0.0–1.2)
CO2: 22 mmol/L (ref 20–29)
Calcium: 9.1 mg/dL (ref 8.6–10.2)
Chloride: 99 mmol/L (ref 96–106)
Creatinine, Ser: 1.4 mg/dL — ABNORMAL HIGH (ref 0.76–1.27)
GFR calc Af Amer: 57 mL/min/{1.73_m2} — ABNORMAL LOW (ref >59)
GFR calc non Af Amer: 49 mL/min/{1.73_m2} — ABNORMAL LOW (ref >59)
Globulin, Total: 2.5 g/dL (ref 1.5–4.5)
Glucose: 101 mg/dL — ABNORMAL HIGH (ref 65–99)
Potassium: 4.1 mmol/L (ref 3.5–5.2)
SODIUM: 139 mmol/L (ref 134–144)
Total Protein: 6.6 g/dL (ref 6.0–8.5)

## 2018-04-29 LAB — BRAIN NATRIURETIC PEPTIDE: BNP: 84.5 pg/mL (ref 0.0–100.0)

## 2018-04-29 NOTE — Telephone Encounter (Signed)
Pt seen and bp discussed.  This message not conveyed to pt before appt, Dgaddy, CMA

## 2018-05-03 ENCOUNTER — Ambulatory Visit (INDEPENDENT_AMBULATORY_CARE_PROVIDER_SITE_OTHER): Payer: Medicare HMO | Admitting: Neurology

## 2018-05-03 ENCOUNTER — Encounter: Payer: Self-pay | Admitting: Neurology

## 2018-05-03 VITALS — BP 160/92 | HR 46 | Ht 73.0 in | Wt 318.0 lb

## 2018-05-03 DIAGNOSIS — R0683 Snoring: Secondary | ICD-10-CM | POA: Diagnosis not present

## 2018-05-03 DIAGNOSIS — R351 Nocturia: Secondary | ICD-10-CM

## 2018-05-03 DIAGNOSIS — M7989 Other specified soft tissue disorders: Secondary | ICD-10-CM | POA: Diagnosis not present

## 2018-05-03 DIAGNOSIS — G479 Sleep disorder, unspecified: Secondary | ICD-10-CM | POA: Diagnosis not present

## 2018-05-03 DIAGNOSIS — I5032 Chronic diastolic (congestive) heart failure: Secondary | ICD-10-CM | POA: Diagnosis not present

## 2018-05-03 DIAGNOSIS — Z6841 Body Mass Index (BMI) 40.0 and over, adult: Secondary | ICD-10-CM

## 2018-05-03 DIAGNOSIS — G4700 Insomnia, unspecified: Secondary | ICD-10-CM

## 2018-05-03 NOTE — Patient Instructions (Signed)

## 2018-05-03 NOTE — Progress Notes (Signed)
Subjective:    Patient ID: Nathan Grant is a 73 y.o. male.  HPI     [name], MD, PhD [facility] [address], [address]O. [place], [place] [address]  Dear Dr. Nolon Rod,  I saw your patient, Nathan Grant, upon your kind request in the sleep clinic today for initial consultation of his sleep disorder, in particular, concern for underlying obstructive sleep apnea. The patient is unaccompanied today. As you know, Nathan Grant is a 73 year old right-handed gentleman with an underlying medical history of diabetes, chronic kidney disease, gout, hypertension, bradycardia, atrial flutter with status post cardioversion, chronic diastolic CHF, nonsustained ventricular tachycardia, osteoarthritis, and morbid obesity with a BMI of over 40, who reports snoring and difficulty with sleep initiation and sleep maintenance. I reviewed your office note from 04/28/2018. [place] having had a sleep study in the past, with results unknown. He has never been on CPAP therapy. His Epworth sleepiness score is 2 out of 24, fatigue score is 15 out of 63. He is married and lives with his wife, he is retired, he chews tobacco but does not smoke cigarettes, he does not drink alcohol and drinks caffeine occasionally in the form of coffee. He has 6 children, all grown, no pets in the house. 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  He has a BT of 8 PM and is often at 3 AM. He has had some difficulty maintaining sleep and also sometimes he has trouble going to sleep, has not tried any prescription or over-the-counter medications for this. He is not aware of any family history of OSA. He has significant nocturia, about 3-4 times per average night, denies morning headaches.  His Past Medical History Is Significant For: Past Medical History:  Diagnosis Date  . Atrial flutter ([place])    s/p cardioversion 06/2010  . Bradycardia    a. Accelerated junctional  & sinus bradycardia during 01/2013 adm.  . Chronic headaches   . Degenerative joint disease     End-stage degenerative joint disease, left knee  . Erectile dysfunction   . HTN (hypertension)   . Hypertensive heart disease    a. Admit for CP 01/2013 felt due to this. Cath with normal coronary anatomy.  . Microcytic anemia  03/22/2001  . Morbid obesity ([place])   . NSVT (nonsustained ventricular tachycardia) ([place])    a. During 01/2013 adm.  . Obesity   . Osteoarthritis   . Tendonitis   . Tobacco chew use     History of chewing tobacco usage.   . Urinary tract infection     Pseudomonas urinary tract infection    His Past Surgical History Is Significant For: Past Surgical History:  Procedure Laterality Date  . CARDIOVERSION  07/01/2010  . LEFT HEART CATHETERIZATION WITH CORONARY ANGIOGRAM N/A 01/16/2013   Procedure: LEFT HEART CATHETERIZATION WITH CORONARY ANGIOGRAM;  Surgeon: Peter M Martinique, MD;  Location: [facility] CATH LAB;  Service: Cardiovascular;  Laterality: N/A;  . TOTAL KNEE ARTHROPLASTY  May 2009   Bilateral  . VASECTOMY      His Family History Is Significant For: Family History  Problem Relation Age of Onset  . Alcohol abuse Brother   . Hypertension Brother   . Hypertension Mother     His Social History Is Significant For: Social History   Socioeconomic History  . Marital status: Married    Spouse name: Not on file  . Number of children: 6  .  Years of education: Not on file  . Highest education level: Not on file  Occupational History  . Occupation: Day Care    Employer: RETIRED  Social Needs  . Financial resource strain: Not on file  . Food insecurity:    Worry: Not on file    Inability: Not on file  . Transportation needs:    Medical: Not on file    Non-medical: Not on file  Tobacco Use  . Smoking status: Never Smoker  . Smokeless tobacco: Current User    Types: Chew  Substance and Sexual Activity  . Alcohol use: No  . Drug use: No  . Sexual activity:  Yes  Lifestyle  . Physical activity:    Days per week: Not on file    Minutes per session: Not on file  . Stress: Not on file  Relationships  . Social connections:    Talks on phone: Not on file    Gets together: Not on file    Attends religious service: Not on file    Active member of club or organization: Not on file    Attends meetings of clubs or organizations: Not on file    Relationship status: Not on file  Other Topics Concern  . Not on file  Social History Narrative  . Not on file    His Allergies Are:  Allergies  Allergen Reactions  . Aspirin   . Ibuprofen   . Tylenol [Acetaminophen]   :   His Current Medications Are:  Outpatient Encounter Medications as of 05/03/2018  Medication Sig  . allopurinol (ZYLOPRIM) 100 MG tablet Take 1 tablet (100 mg total) by mouth daily.  [name] amLODipine (NORVASC) 10 MG tablet TAKE 1 TABLET BY MOUTH ONCE DAILY  . aspirin EC 81 MG tablet Take 1 tablet (81 mg total) by mouth daily.  . colchicine 0.6 MG tablet Take 0.6mg  three times a day on day one of gout flare then after that take 0.6mg  by mouth twice daily until gout flare stops.  . fluticasone (FLONASE) 50 MCG/ACT nasal spray Place 2 sprays into both nostrils daily.  [name] lisinopril (PRINIVIL,ZESTRIL) 40 MG tablet Take 1 tablet (40 mg total) by mouth daily.  . metoprolol succinate (TOPROL-XL) 25 MG 24 hr tablet TAKE 1 TABLET BY MOUTH ONCE DAILY (Patient taking differently: Take 25 mg by mouth daily. )  . Multiple Vitamins-Minerals (CENTRUM SILVER PO) Take 1 tablet by mouth daily.  . potassium chloride SA (K-DUR,KLOR-CON) 20 MEQ tablet Take 1 tablet (20 mEq total) by mouth daily.  . pravastatin (PRAVACHOL) 40 MG tablet TAKE 1 TABLET BY MOUTH ONCE DAILY IN THE EVENING  . sucralfate (CARAFATE) 1 GM/10ML suspension Take 10 mLs (1 g total) by mouth 4 (four) times daily -  with meals and at bedtime.   No facility-administered encounter medications on file as of 05/03/2018.   :  Review of  Systems:  Out of a complete 14 point review of systems, all are reviewed and negative with the exception of these symptoms as listed below: Review of Systems  Neurological:       Pt presents today to discuss his sleep. Pt has had a sleep study many years ago but did not start a cpap. Pt does endorse snoring.  Epworth Sleepiness Scale 0= would never doze 1= slight chance of dozing 2= moderate chance of dozing 3= high chance of dozing  Sitting and reading: 1 Watching TV: 1 Sitting inactive in a public place (ex. Theater or meeting): 0  As a passenger in a car for an hour without a break: 0 Lying down to rest in the afternoon: 0 Sitting and talking to someone: 0 Sitting quietly after lunch (no alcohol): 0 In a car, while stopped in traffic: 0 Total: 2     Objective:  Neurological Exam  Physical Exam Physical Examination:   Vitals:   05/03/18 1040  BP: (!) 160/92  Pulse: (!) 46  SpO2: 98%    General Examination: The patient is a very pleasant 73 y.o. male in no acute distress. He appears well-developed and well-nourished and well groomed.   HEENT: Normocephalic, atraumatic, pupils are equal, round and reactive to light and accommodation. Extraocular tracking is good without limitation to gaze excursion or nystagmus noted. Normal smooth pursuit is noted. Hearing is grossly intact. Face is symmetric with normal facial animation and normal facial sensation. Speech is clear with no dysarthria noted. There is no hypophonia. There is no lip, neck/head, jaw or voice tremor. Neck is supple with full range of passive and active motion. There are no carotid bruits on auscultation. Oropharynx exam reveals: moderate mouth dryness, edentulous, and moderate airway crowding, due to larger tongue. Tonsils are not fully visualized, Mallampati is class II. Neck circumference is 17-7/8 inches. Tongue protrudes centrally and palate elevates symmetrically.   Chest: Clear to auscultation without  wheezing, rhonchi or crackles noted.  Heart: S1+S2+0, regular and normal without murmurs, rubs or gallops noted.   Abdomen: Soft, non-tender and non-distended with normal bowel sounds appreciated on auscultation.  Extremities: There is 1+ pitting edema in the distal lower extremities bilaterally. Pedal pulses are intact.  Skin: Warm and dry without trophic changes noted.  Musculoskeletal: exam reveals no obvious joint deformities, tenderness or joint swelling or erythema, with the exception of decreased range of motion in the right shoulder, he is status post bilateral knee replacements.   Neurologically:  Mental status: The patient is awake, alert and oriented in all 4 spheres. His immediate and remote memory, attention, language skills and fund of knowledge are appropriate. There is no evidence of aphasia, agnosia, apraxia or anomia. Speech is clear with normal prosody and enunciation. Thought process is linear. Mood is normal and affect is normal.  Cranial nerves II - XII are as described above under HEENT exam. In addition: shoulder shrug is normal with equal shoulder height noted. Motor exam: Normal bulk, strength and tone is noted. There is no drift, tremor or rebound. Romberg is negative. Reflexes are. Fine motor skills and coordination: intact with normal finger taps, normal hand movements, normal rapid alternating patting, normal foot taps and normal foot agility.  Cerebellar testing: No dysmetria or intention tremor on finger to nose testing. Heel to shin is unremarkable bilaterally. There is no truncal or gait ataxia.  Sensory exam: intact to light touch in the upper and lower extremities.  Gait, station and balance: He stands easily. No veering to one side is noted. No leaning to one side is noted. Posture is age-appropriate and stance is narrow based. Gait shows normal stride length and normal pace. No problems turning are noted. Tandem walk is somewhat challenging for him.                Assessment and Plan:  In summary, Estevan Mcadam is a very pleasant 73 y.o.-year old male with an underlying medical history of diabetes, chronic kidney disease, gout, hypertension, bradycardia, atrial flutter with status post cardioversion, chronic diastolic CHF, nonsustained ventricular tachycardia, osteoarthritis, and morbid obesity with a  BMI of over 40, whose history and physical exam are concerning for obstructive sleep apnea (OSA). I had a long chat with the patient about my findings and the diagnosis of OSA, its prognosis and treatment options. We talked about medical treatments, surgical interventions and non-pharmacological approaches. I explained in particular the risks and ramifications of untreated moderate to severe OSA, especially with respect to developing cardiovascular disease down the Road, including congestive heart failure, difficult to treat hypertension, cardiac arrhythmias, or stroke. Even type 2 diabetes has, in part, been linked to untreated OSA. Symptoms of untreated OSA include daytime sleepiness, memory problems, mood irritability and mood disorder such as depression and anxiety, lack of energy, as well as recurrent headaches, especially morning headaches. We talked about tobacco cessation and trying to maintain a healthy lifestyle in general, as well as the importance of weight control. I encouraged the patient to eat healthy, exercise daily and keep well hydrated, to keep a scheduled bedtime and wake time routine, to not skip any meals and eat healthy snacks in between meals. I advised the patient not to drive when feeling sleepy. I recommended the following at this time: sleep study with potential positive airway pressure titration. (We will score hypopneas at 4%).   I explained the sleep test procedure to the patient and also outlined possible surgical and non-surgical treatment options of OSA, including the use of a custom-made dental device (which would require a  referral to a specialist dentist or oral surgeon), upper airway surgical options, such as pillar implants, radiofrequency surgery, tongue base surgery, and UPPP (which would involve a referral to an ENT surgeon). Rarely, jaw surgery such as mandibular advancement may be considered.  I also explained the CPAP treatment option to the patient, who indicated that he would be willing to try CPAP if the need arises. I explained the importance of being compliant with PAP treatment, not only for insurance purposes but primarily to improve His symptoms, and for the patient's long term health benefit, including to reduce His cardiovascular risks. I answered all his questions today and the patient was in agreement. I plan to see him back after the sleep study is completed and encouraged her to call with any interim questions, concerns, problems or updates.   Thank you very much for allowing me to participate in the care of this nice patient. If I can be of any further assistance to you please do not hesitate to call me at [phone].  Sincerely,   [name], MD, PhD

## 2018-05-11 ENCOUNTER — Encounter: Payer: Self-pay | Admitting: Podiatry

## 2018-05-11 ENCOUNTER — Ambulatory Visit (INDEPENDENT_AMBULATORY_CARE_PROVIDER_SITE_OTHER): Payer: Medicare HMO | Admitting: Podiatry

## 2018-05-11 ENCOUNTER — Other Ambulatory Visit: Payer: Self-pay | Admitting: Podiatry

## 2018-05-11 ENCOUNTER — Ambulatory Visit (INDEPENDENT_AMBULATORY_CARE_PROVIDER_SITE_OTHER): Payer: Medicare HMO

## 2018-05-11 VITALS — BP 201/103 | HR 49

## 2018-05-11 DIAGNOSIS — M79671 Pain in right foot: Secondary | ICD-10-CM

## 2018-05-11 DIAGNOSIS — B351 Tinea unguium: Secondary | ICD-10-CM

## 2018-05-11 DIAGNOSIS — M779 Enthesopathy, unspecified: Secondary | ICD-10-CM

## 2018-05-11 DIAGNOSIS — M79674 Pain in right toe(s): Secondary | ICD-10-CM

## 2018-05-11 DIAGNOSIS — M79675 Pain in left toe(s): Secondary | ICD-10-CM | POA: Diagnosis not present

## 2018-05-11 DIAGNOSIS — M79672 Pain in left foot: Secondary | ICD-10-CM

## 2018-05-11 NOTE — Progress Notes (Signed)
Subjective:   Patient ID: Nathan Grant, male   DOB: 73 y.o.   MRN: [id number]   HPI Patient presents with very thickened nails of both feet flat feet deformity moderate discomfort in the feet and concerns about numbness that occurs periodically in his feet.  Patient does not smoke likes to be active and is currently retired   Review of Systems  All other systems reviewed and are negative.       Objective:  Physical Exam Vitals signs and nursing note reviewed.  Constitutional:      Appearance: He is well-developed.  Pulmonary:     Effort: Pulmonary effort is normal.  Musculoskeletal: Normal range of motion.  Skin:    General: Skin is warm.  Neurological:     Mental Status: He is alert.     Vascular status with ped would diminishment of neurological left but nothing that appears to be causing any problems for him.  He does have dry skin and mild discomfort plantar feet with the flatfeet deformity and does have significant thickness of his nailbeds 1-5 both feet with dystrophic changes and pain and inability to cut himself with high blood pressure today that he states is generally not this high and he will check at home and see the doctor if it remains elevated  X-ray indicates there is quite a bit of flattening the arch and spur formation with arthritis noted and no indications of stress fracture     Assessment:  Mycotic nail infection with pain 1-5 both feet mild neuropathy left with flatfoot deformity tendinitis     Plan:  H&P discussed all to proceed and today I recommended supportive shoes and medication for dry skin.  I debrided nailbeds 1-5 both feet with no iatrogenic bleeding and patient will be seen back for routine care visit

## 2018-05-14 ENCOUNTER — Other Ambulatory Visit: Payer: Self-pay | Admitting: Physician Assistant

## 2018-05-14 ENCOUNTER — Other Ambulatory Visit: Payer: Self-pay | Admitting: Family Medicine

## 2018-05-14 DIAGNOSIS — I5032 Chronic diastolic (congestive) heart failure: Secondary | ICD-10-CM

## 2018-05-31 ENCOUNTER — Telehealth: Payer: Self-pay

## 2018-05-31 NOTE — Telephone Encounter (Signed)
We have attempted to call the patient two times to schedule sleep study.  Patient has been unavailable at the phone numbers we have on file and has not returned our calls.  At this point we will send a letter asking patient to please contact the sleep lab to schedule their sleep study.  If patient calls back we will schedule them for their sleep study. 

## 2018-06-09 ENCOUNTER — Other Ambulatory Visit: Payer: Self-pay | Admitting: Family Medicine

## 2018-06-09 DIAGNOSIS — Z13220 Encounter for screening for lipoid disorders: Secondary | ICD-10-CM

## 2018-06-09 DIAGNOSIS — I10 Essential (primary) hypertension: Secondary | ICD-10-CM

## 2018-06-09 DIAGNOSIS — I5032 Chronic diastolic (congestive) heart failure: Secondary | ICD-10-CM

## 2018-06-09 MED ORDER — LISINOPRIL 40 MG PO TABS: 40.0000 mg | ORAL_TABLET | Freq: Every day | ORAL | 0 refills | Status: DC

## 2018-06-09 MED ORDER — ALLOPURINOL 100 MG PO TABS: 100.0000 mg | ORAL_TABLET | Freq: Every day | ORAL | 0 refills | Status: DC

## 2018-06-09 NOTE — Telephone Encounter (Signed)
Copied from [place] [id number]. Topic: Quick Communication - Rx Refill/Question >> Jun 09, 2018  1:58 PM [place], [place] D wrote: Medication: allopurinol (ZYLOPRIM) 100 MG tablet / lisinopril (PRINIVIL,ZESTRIL) 40 MG tablet / metoprolol succinate (TOPROL-XL) 25 MG 24 hr tablet / pravastatin (PRAVACHOL) 40 MG tablet  Has the patient contacted their pharmacy? Yes.   (Agent: If no, request that the patient contact the pharmacy for the refill.) (Agent: If yes, when and what did the pharmacy advise?)  Preferred Pharmacy (with phone number or street name): [facility], [place]. [phone] (Phone) [phone] (Fax)    Agent: Please be advised that RX refills may take up to 3 business days. We ask that you follow-up with your pharmacy.

## 2018-07-26 ENCOUNTER — Telehealth: Payer: Medicare HMO | Admitting: Family Medicine

## 2018-07-26 ENCOUNTER — Other Ambulatory Visit: Payer: Self-pay

## 2018-08-10 ENCOUNTER — Ambulatory Visit: Payer: Medicare HMO | Admitting: Podiatry

## 2018-09-20 ENCOUNTER — Telehealth: Payer: Self-pay | Admitting: Family Medicine

## 2018-09-20 NOTE — Telephone Encounter (Signed)
Copied from [place] [id number]. Topic: Quick Communication - Rx Refill/Question >> Sep 20, 2018  3:30 PM [place] Qu[place] A wrote: Medication: amLODipine (NORVASC) 10 MG tablet, allopurinol (ZYLOPRIM) 100 MG tablet, lisinopril (PRINIVIL,ZESTRIL) 40 MG tablet  Has the patient contacted their pharmacy? Yes.   (Agent: If no, request that the patient contact the pharmacy for the refill.) (Agent: If yes, when and what did the pharmacy advise?)  Preferred Pharmacy (with phone number or street name): [place], [place]. [phone] (Phone) [phone] (Fax)    Agent: Please be advised that RX refills may take up to 3 business days. We ask that you follow-up with your pharmacy.

## 2018-09-23 ENCOUNTER — Other Ambulatory Visit: Payer: Self-pay

## 2018-09-23 DIAGNOSIS — I10 Essential (primary) hypertension: Secondary | ICD-10-CM

## 2018-09-23 DIAGNOSIS — I5032 Chronic diastolic (congestive) heart failure: Secondary | ICD-10-CM

## 2018-09-23 MED ORDER — AMLODIPINE BESYLATE 10 MG PO TABS: 10.0000 mg | ORAL_TABLET | Freq: Every day | ORAL | 0 refills | Status: DC

## 2018-09-23 MED ORDER — LISINOPRIL 40 MG PO TABS: 40.0000 mg | ORAL_TABLET | Freq: Every day | ORAL | 0 refills | Status: DC

## 2018-09-23 MED ORDER — ALLOPURINOL 100 MG PO TABS: 100.0000 mg | ORAL_TABLET | Freq: Every day | ORAL | 0 refills | Status: DC

## 2018-09-23 NOTE — Telephone Encounter (Signed)
Rx has been sent to pharmacy, please call pt and schedule appointment with Dr. Nolon Rod for follow-up within the next 30 days.

## 2018-09-26 NOTE — Telephone Encounter (Signed)
Called pt to set up appt No answer and VM not set up

## 2018-10-13 ENCOUNTER — Telehealth: Payer: Self-pay | Admitting: Family Medicine

## 2018-10-13 DIAGNOSIS — E1122 Type 2 diabetes mellitus with diabetic chronic kidney disease: Secondary | ICD-10-CM

## 2018-10-13 DIAGNOSIS — N183 Chronic kidney disease, stage 3 unspecified: Secondary | ICD-10-CM

## 2018-10-13 DIAGNOSIS — E782 Mixed hyperlipidemia: Secondary | ICD-10-CM

## 2018-10-13 NOTE — Telephone Encounter (Signed)
Copied from [place] [id number]. Topic: Quick Communication - See Telephone Encounter >> Oct 13, 2018 11:50 AM [place] wrote: CRM for notification. See Telephone encounter for: 10/13/18. Pt wants someone to call him about all meds being switch to a 90 refill term. Stated opening Monday

## 2018-10-15 NOTE — Telephone Encounter (Signed)
Patient needs an appointment for labs then appointment for office visit to go over labs and refill meds  Labs already ordered

## 2018-10-16 ENCOUNTER — Other Ambulatory Visit: Payer: Self-pay | Admitting: Family Medicine

## 2018-10-16 DIAGNOSIS — I10 Essential (primary) hypertension: Secondary | ICD-10-CM

## 2018-10-21 NOTE — Telephone Encounter (Signed)
Called someone said call back

## 2018-10-21 NOTE — Telephone Encounter (Signed)
Spoke to patient he wanted bw and ofcv at the same time made provider appnt

## 2018-10-28 ENCOUNTER — Other Ambulatory Visit: Payer: Self-pay

## 2018-10-28 ENCOUNTER — Ambulatory Visit (INDEPENDENT_AMBULATORY_CARE_PROVIDER_SITE_OTHER): Payer: Medicare HMO | Admitting: Family Medicine

## 2018-10-28 ENCOUNTER — Ambulatory Visit (INDEPENDENT_AMBULATORY_CARE_PROVIDER_SITE_OTHER): Payer: Medicare HMO

## 2018-10-28 ENCOUNTER — Encounter: Payer: Self-pay | Admitting: Family Medicine

## 2018-10-28 VITALS — BP 186/102 | HR 76 | Temp 99.0°F | Resp 17 | Ht 73.0 in | Wt 326.4 lb

## 2018-10-28 DIAGNOSIS — M1A39X0 Chronic gout due to renal impairment, multiple sites, without tophus (tophi): Secondary | ICD-10-CM

## 2018-10-28 DIAGNOSIS — Z13220 Encounter for screening for lipoid disorders: Secondary | ICD-10-CM

## 2018-10-28 DIAGNOSIS — N183 Chronic kidney disease, stage 3 unspecified: Secondary | ICD-10-CM

## 2018-10-28 DIAGNOSIS — I5032 Chronic diastolic (congestive) heart failure: Secondary | ICD-10-CM | POA: Diagnosis not present

## 2018-10-28 DIAGNOSIS — E1122 Type 2 diabetes mellitus with diabetic chronic kidney disease: Secondary | ICD-10-CM

## 2018-10-28 DIAGNOSIS — M7712 Lateral epicondylitis, left elbow: Secondary | ICD-10-CM | POA: Diagnosis not present

## 2018-10-28 DIAGNOSIS — I10 Essential (primary) hypertension: Secondary | ICD-10-CM

## 2018-10-28 DIAGNOSIS — M19022 Primary osteoarthritis, left elbow: Secondary | ICD-10-CM | POA: Diagnosis not present

## 2018-10-28 LAB — POCT GLYCOSYLATED HEMOGLOBIN (HGB A1C): Hemoglobin A1C: 6.1 % — AB (ref 4.0–5.6)

## 2018-10-28 NOTE — Patient Instructions (Addendum)
If you have lab work done today you will be contacted with your lab results within the next 2 weeks.  If you have not heard from [place] then please contact us. The fastest way to get your results is to register for My Chart.   IF you received an x-ray today, you will receive an invoice from [facility]. Please contact [facility] at [phone] with questions or concerns regarding your invoice.   IF you received labwork today, you will receive an invoice from [facility]. Please contact [facility] at [phone] with questions or concerns regarding your invoice.   Our billing staff will not be able to assist you with questions regarding bills from these companies.  You will be contacted with the lab results as soon as they are available. The fastest way to get your results is to activate your My Chart account. Instructions are located on the last page of this paperwork. If you have not heard from [place] regarding the results in 2 weeks, please contact this office.     Tennis Elbow  Tennis elbow (lateral epicondylitis) is inflammation of tendons in your outer forearm, near your elbow. Tendons are tissues that connect muscle to bone. When you have tennis elbow, inflammation affects the tendons that you use to bend your wrist and move your hand up. Inflammation occurs in the lower part of the upper arm bone (humerus), where the tendons connect to the bone (lateral epicondyle). Tennis elbow often affects people who play tennis, but anyone may get the condition from repeatedly extending the wrist or turning the forearm. What are the causes? This condition is usually caused by repeatedly extending the wrist, turning the forearm, and using the hands. It can result from sports or work that requires repetitive forearm movements. In some cases, it may be caused by a sudden injury. What increases the risk? You are more likely to develop tennis elbow if you play tennis or another racket sport.  You also have a higher risk if you frequently use your hands for work. Besides people who play tennis, others at greater risk include:  Musicians.  Carpenters, painters, and plumbers.  Cooks.  Cashiers.  People who work in [employer].  Architect workers.  Butchers.  People who use computers. What are the signs or symptoms? Symptoms of this condition include:  Pain and tenderness in the forearm and the outer part of the elbow. Pain may be felt only when using the arm, or it may be there all the time.  A burning feeling that starts in the elbow and spreads down the forearm.  A weak grip in the hand. How is this diagnosed? This condition may be diagnosed based on:  Your symptoms and medical history.  A physical exam.  X-rays.  MRI. How is this treated? Resting and icing your arm is often the first treatment. Your health care provider may also recommend:  Medicines to reduce pain and inflammation. These may be in the form of a pill, topical gels, or shots of a steroid medicine (cortisone).  An elbow strap to reduce stress on the area.  Physical therapy. This may include massage or exercises.  An elbow brace to restrict the movements that cause symptoms. If these treatments do not help relieve your symptoms, your health care provider may recommend surgery to remove damaged muscle and reattach healthy muscle to bone. Follow these instructions at home: Activity  Rest your elbow and wrist and avoid activities that cause symptoms, as told by  your health care provider.  Do physical therapy exercises as instructed.  If you lift an object, lift it with your palm facing up. This reduces stress on your elbow. Lifestyle  If your tennis elbow is caused by sports, check your equipment and make sure that: ? You are using it correctly. ? It is the best fit for you.  If your tennis elbow is caused by work or computer use, take frequent breaks to stretch your arm. Talk with  your manager about ways to manage your condition at work. If you have a brace:  Wear the brace or strap as told by your health care provider. Remove it only as told by your health care provider.  Loosen the brace if your fingers tingle, become numb, or turn cold and blue.  Keep the brace clean.  If the brace is not waterproof, ask if you may remove it for bathing. If you must keep the brace on while bathing: ? Do not let it get wet. ? Cover it with a watertight covering when you take a bath or a shower. General instructions   If directed, put ice on the painful area: ? Put ice in a plastic bag. ? Place a towel between your skin and the bag. ? Leave the ice on for 20 minutes, 2-3 times a day.  Take over-the-counter and prescription medicines only as told by your health care provider.  Keep all follow-up visits as told by your health care provider. This is important. Contact a health care provider if:  You have pain that gets worse or does not get better with treatment.  You have numbness or weakness in your forearm, hand, or fingers. Summary  Tennis elbow (lateral epicondylitis) is inflammation of tendons in your outer forearm, near your elbow.  Common symptoms include pain and tenderness in your forearm and the outer part of your elbow.  This condition is usually caused by repeatedly extending your wrist, turning your forearm, and using your hands.  The first treatment is often resting and icing your arm to relieve symptoms. Further treatment may include taking medicine, getting physical therapy, wearing a brace or strap, or having surgery. This information is not intended to replace advice given to you by your health care provider. Make sure you discuss any questions you have with your health care provider. Document Released: 03/30/2005 Document Revised: 12/24/2017 Document Reviewed: 01/12/2017 Elsevier Patient Education  2020 [employer].

## 2018-10-28 NOTE — Progress Notes (Signed)
Established Patient Office Visit  Subjective:  Patient ID: Nathan Grant, male    DOB: January 13, 1946  Age: 73 y.o. MRN: [id number]  CC:  Chief Complaint  Patient presents with  . wanted to see provider and get labs drawn  . Medication Refill    allopurinol, amlodipine, colchicine, fluticasone, lisinopril, metoprolol, potassium chloride, pravastatin, sucralfate  . left arm pain    unable to bend arm at the elbow and unable to lay arm flat    HPI Nathan Grant presents for   Patient reports that he was waiting for his refills but did not know that he needed an office visit  He ran out of his medications a week ago  Hypertension: Patient here for follow-up of elevated blood pressure. He is exercising and is adherent to low salt diet.  Blood pressure is well controlled at home. Cardiac symptoms none. Patient denies chest pain, chest pressure/discomfort, claudication, dyspnea and exertional chest pressure/discomfort.  Cardiovascular risk factors: dyslipidemia and hypertension. Use of agents associated with hypertension: none. History of target organ damage: angina/ prior myocardial infarction, chronic kidney disease, heart failure, left ventricular hypertrophy and peripheral artery disease. BP Readings from Last 3 Encounters:  10/28/18 (!) 186/102  05/11/18 (!) 201/103  05/03/18 (!) 160/92    Dyslipidemia: Patient presents for evaluation of lipids.  Compliance with treatment thus far has been good.  A repeat fasting lipid profile was done.  The patient does not use medications that may worsen dyslipidemias (corticosteroids, progestins, anabolic steroids, diuretics, beta-blockers, amiodarone, cyclosporine, olanzapine). The patient exercises rarely.  The patient is known to have coexisting coronary artery disease.   The 10-year ASCVD risk score Nathan Grant DC Jr., et al., 2013) is: 67.1%   Values used to calculate the score:     Age: 66 years     Sex: Male     Is Non-Hispanic African  American: No     Diabetic: Yes     Tobacco smoker: No     Systolic Blood Pressure: 094 mmHg     Is BP treated: Yes     HDL Cholesterol: 56 mg/dL     Total Cholesterol: 192 mg/dL   Lateral epicondylitis He states that he has not been able to lock his left elbow He is right handed He reports that he has tenderness on the outer part of the elbow He is not able to reach behind to put his shirt on   Chronic Gout Pt denies recent gout flares] He states that he has changed his diet and he is responsible for the cooking No alcohol No red meat He takes allopurinol He does not take nsaid  Past Medical History:  Diagnosis Date  . Atrial flutter ([place])    s/p cardioversion 06/2010  . Bradycardia    a. Accelerated junctional & sinus bradycardia during 01/2013 adm.  . Chronic headaches   . Degenerative joint disease     End-stage degenerative joint disease, left knee  . Erectile dysfunction   . HTN (hypertension)   . Hypertensive heart disease    a. Admit for CP 01/2013 felt due to this. Cath with normal coronary anatomy.  . Microcytic anemia  03/22/2001  . Morbid obesity ([place])   . NSVT (nonsustained ventricular tachycardia) ([place])    a. During 01/2013 adm.  . Obesity   . Osteoarthritis   . Tendonitis   . Tobacco chew use     History of chewing tobacco usage.   . Urinary tract infection  Pseudomonas urinary tract infection    Past Surgical History:  Procedure Laterality Date  . CARDIOVERSION  07/01/2010  . LEFT HEART CATHETERIZATION WITH CORONARY ANGIOGRAM N/A 01/16/2013   Procedure: LEFT HEART CATHETERIZATION WITH CORONARY ANGIOGRAM;  Surgeon: Nathan M Martinique, MD;  Location: [facility] CATH LAB;  Service: Cardiovascular;  Laterality: N/A;  . TOTAL KNEE ARTHROPLASTY  May 2009   Bilateral  . VASECTOMY      Family History  Problem Relation Age of Onset  . Alcohol abuse Brother   . Hypertension Brother   . Hypertension Mother     Social History   Socioeconomic History  .  Marital status: Married    Spouse name: Not on file  . Number of children: 6  . Years of education: Not on file  . Highest education level: Not on file  Occupational History  . Occupation: Day Care    Employer: RETIRED  Social Needs  . Financial resource strain: Not on file  . Food insecurity    Worry: Not on file    Inability: Not on file  . Transportation needs    Medical: Not on file    Non-medical: Not on file  Tobacco Use  . Smoking status: Never Smoker  . Smokeless tobacco: Current User    Types: Chew  Substance and Sexual Activity  . Alcohol use: No  . Drug use: No  . Sexual activity: Yes  Lifestyle  . Physical activity    Days per week: Not on file    Minutes per session: Not on file  . Stress: Not on file  Relationships  . Social Herbalist on phone: Not on file    Gets together: Not on file    Attends religious service: Not on file    Active member of club or organization: Not on file    Attends meetings of clubs or organizations: Not on file    Relationship status: Not on file  . Intimate partner violence    Fear of current or ex partner: Not on file    Emotionally abused: Not on file    Physically abused: Not on file    Forced sexual activity: Not on file  Other Topics Concern  . Not on file  Social History Narrative  . Not on file    Outpatient Medications Prior to Visit  Medication Sig Dispense Refill  . aspirin EC 81 MG tablet Take 1 tablet (81 mg total) by mouth daily. 90 tablet 1  . fluticasone (FLONASE) 50 MCG/ACT nasal spray Place 2 sprays into both nostrils daily. 16 g 6  . Multiple Vitamins-Minerals (CENTRUM SILVER PO) Take 1 tablet by mouth daily.    . potassium chloride SA (K-DUR,KLOR-CON) 20 MEQ tablet Take 1 tablet (20 mEq total) by mouth daily. 90 tablet 3  . sucralfate (CARAFATE) 1 GM/10ML suspension Take 10 mLs (1 g total) by mouth 4 (four) times daily -  with meals and at bedtime. 420 mL 0  . allopurinol (ZYLOPRIM) 100 MG  tablet Take 1 tablet (100 mg total) by mouth daily. 30 tablet 0  . amLODipine (NORVASC) 10 MG tablet Take 1 tablet (10 mg total) by mouth daily. 30 tablet 0  . colchicine 0.6 MG tablet Take 0.11m three times a day on day one of gout flare then after that take 0.628mby mouth twice daily until gout flare stops. 30 tablet 3  . lisinopril (ZESTRIL) 40 MG tablet Take 1 tablet (40 mg total) by mouth  daily. 30 tablet 0  . metoprolol succinate (TOPROL-XL) 25 MG 24 hr tablet TAKE 1 TABLET BY MOUTH ONCE DAILY 90 tablet 0  . pravastatin (PRAVACHOL) 40 MG tablet TAKE 1 TABLET BY MOUTH ONCE DAILY IN THE EVENING 90 tablet 1   No facility-administered medications prior to visit.     Allergies  Allergen Reactions  . Aspirin   . Ibuprofen   . Tylenol [Acetaminophen]     ROS Review of Systems    Objective:    Physical Exam  BP (!) 186/102 (BP Location: Right Arm, Patient Position: Sitting, Cuff Size: Large)   Pulse 76   Temp 99 F (37.2 C) (Oral)   Resp 17   Ht 6' 1"  (1.854 m)   Wt (!) 326 lb 6.4 oz (148.1 kg)   SpO2 97%   BMI 43.06 kg/m  Wt Readings from Last 3 Encounters:  10/28/18 (!) 326 lb 6.4 oz (148.1 kg)  05/03/18 (!) 318 lb (144.2 kg)  04/28/18 (!) 319 lb 12.8 oz (145.1 kg)   Physical Exam  Constitutional: Oriented to person, place, and time. Appears well-developed and well-nourished.  HENT:  Head: Normocephalic and atraumatic.  Eyes: Conjunctivae and EOM are normal.  Cardiovascular: Normal rate, regular rhythm, normal heart sounds and intact distal pulses.  No murmur heard. Pulmonary/Chest: Effort normal and breath sounds normal. No stridor. No respiratory distress. Has no wheezes.  Neurological: Is alert and oriented to person, place, and time.  Musculoskeletal: left elbow without effusion, noted tenderness over the lateral epidondyle Skin: Skin is warm. Capillary refill takes less than 2 seconds.  Psychiatric: Has a normal mood and affect. Behavior is normal. Judgment and  thought content normal.   EXAM: LEFT ELBOW - COMPLETE 3+ VIEW  COMPARISON:  None.  FINDINGS: No evidence of acute, subacute or healed fractures. Severe narrowing of the humeroulnar and humeral radial joints with associated hypertrophic spurring and likely calcified intra-articular loose bodies. Bone mineral density well-preserved. No POSTERIOR fat pad to confirm a joint effusion or hemarthrosis.  IMPRESSION: Severe osteoarthritis. No acute or subacute osseous abnormality.   Electronically Signed   By: Evangeline Dakin M.D.   On: 10/28/2018 12:37  Health Maintenance Due  Topic Date Due  . OPHTHALMOLOGY EXAM  06/19/1954  . PNA vac Low Risk Adult (1 of 2 - PCV13) 06/18/2009    There are no preventive care reminders to display for this patient.  No results found for: TSH Lab Results  Component Value Date   WBC 5.0 10/28/2018   HGB 11.5 (L) 10/28/2018   HCT 36.6 (L) 10/28/2018   MCV 72 (L) 10/28/2018   PLT 295 10/28/2018   Lab Results  Component Value Date   NA 140 10/28/2018   K 4.1 10/28/2018   CO2 25 10/28/2018   GLUCOSE 82 10/28/2018   BUN 15 10/28/2018   CREATININE 1.76 (H) 10/28/2018   BILITOT 0.4 10/28/2018   ALKPHOS 51 10/28/2018   AST 23 10/28/2018   ALT 18 10/28/2018   PROT 6.3 10/28/2018   ALBUMIN 4.0 10/28/2018   CALCIUM 9.2 10/28/2018   ANIONGAP 11 12/03/2017   GFR 66.34 07/07/2013   Lab Results  Component Value Date   CHOL 192 10/28/2018   Lab Results  Component Value Date   HDL 56 10/28/2018   Lab Results  Component Value Date   LDLCALC 111 (H) 10/28/2018   Lab Results  Component Value Date   TRIG 127 10/28/2018   Lab Results  Component Value Date  CHOLHDL 3.4 10/28/2018   Lab Results  Component Value Date   HGBA1C 6.1 (A) 10/28/2018      Assessment & Plan:   Problem List Items Addressed This Visit      Cardiovascular and Mediastinum   Chronic diastolic CHF (congestive heart failure) (HCC)  -  Continue statin, ace  inhibitor, beta blockers and healthy diet No diuretic since he has gout   Relevant Medications   lisinopril (ZESTRIL) 40 MG tablet   metoprolol succinate (TOPROL-XL) 25 MG 24 hr tablet   amLODipine (NORVASC) 10 MG tablet   pravastatin (PRAVACHOL) 40 MG tablet   Other Relevant Orders   Lipid panel (Completed)   CBC (Completed)   Uncontrolled hypertension  -  Discussed that he should take his blood pressure before coming to clinic  Again he fasting so did not take his meds   Relevant Medications   lisinopril (ZESTRIL) 40 MG tablet   metoprolol succinate (TOPROL-XL) 25 MG 24 hr tablet   amLODipine (NORVASC) 10 MG tablet   pravastatin (PRAVACHOL) 40 MG tablet     Endocrine   CKD stage 3 due to type 2 diabetes mellitus ([place]) - Primary Based on renal function will decrease allopurinol Discussed continued bp control Avoid NSAIDs    Relevant Medications   lisinopril (ZESTRIL) 40 MG tablet   pravastatin (PRAVACHOL) 40 MG tablet   Other Relevant Orders   POCT glycosylated hemoglobin (Hb A1C) (Completed)   CMP14+EGFR (Completed)   CBC (Completed)     Other   Chronic gout due to renal impairment of multiple sites without tophus  -  Discussed decreasing dose to 78m of allopurinol based on renal function Discussed renal monitoring   Relevant Medications   colchicine 0.6 MG tablet    Other Visit Diagnoses    Lateral epicondylitis of left elbow    -   This is most likely arthritis and epicondylitis but discussed using Tylenol PM   Relevant Medications   allopurinol (ZYLOPRIM) 100 MG tablet   colchicine 0.6 MG tablet   Other Relevant Orders   DG ELBOW COMPLETE LEFT (3+VIEW) (Completed)   Screening for lipid disorders       Relevant Medications   pravastatin (PRAVACHOL) 40 MG tablet      Meds ordered this encounter  Medications  . lisinopril (ZESTRIL) 40 MG tablet    Sig: Take 1 tablet (40 mg total) by mouth daily.    Dispense:  90 tablet    Refill:  1  . metoprolol succinate  (TOPROL-XL) 25 MG 24 hr tablet    Sig: Take 1 tablet (25 mg total) by mouth daily.    Dispense:  90 tablet    Refill:  1  . amLODipine (NORVASC) 10 MG tablet    Sig: Take 1 tablet (10 mg total) by mouth daily.    Dispense:  90 tablet    Refill:  1  . allopurinol (ZYLOPRIM) 100 MG tablet    Sig: Take 0.5 tablets (50 mg total) by mouth daily.    Dispense:  45 tablet    Refill:  1  . pravastatin (PRAVACHOL) 40 MG tablet    Sig: Take 1 tablet (40 mg total) by mouth every evening.    Dispense:  90 tablet    Refill:  1  . colchicine 0.6 MG tablet    Sig: Take 0.661mthree times a day on day one of gout flare then after that take 0.68m59my mouth twice daily until gout flare stops.  Dispense:  30 tablet    Refill:  3    Follow-up: Return in about 4 weeks (around 11/25/2018) for nurse visit for blood pressure .    Forrest Moron, MD

## 2018-10-29 LAB — CMP14+EGFR
ALT: 18 IU/L (ref 0–44)
AST: 23 IU/L (ref 0–40)
Albumin/Globulin Ratio: 1.7 (ref 1.2–2.2)
Albumin: 4 g/dL (ref 3.7–4.7)
Alkaline Phosphatase: 51 IU/L (ref 39–117)
BUN/Creatinine Ratio: 9 — ABNORMAL LOW (ref 10–24)
BUN: 15 mg/dL (ref 8–27)
Bilirubin Total: 0.4 mg/dL (ref 0.0–1.2)
CO2: 25 mmol/L (ref 20–29)
Calcium: 9.2 mg/dL (ref 8.6–10.2)
Chloride: 98 mmol/L (ref 96–106)
Creatinine, Ser: 1.76 mg/dL — ABNORMAL HIGH (ref 0.76–1.27)
GFR calc Af Amer: 43 mL/min/{1.73_m2} — ABNORMAL LOW (ref >59)
GFR calc non Af Amer: 37 mL/min/{1.73_m2} — ABNORMAL LOW (ref >59)
Globulin, Total: 2.3 g/dL (ref 1.5–4.5)
Glucose: 82 mg/dL (ref 65–99)
Potassium: 4.1 mmol/L (ref 3.5–5.2)
Sodium: 140 mmol/L (ref 134–144)
Total Protein: 6.3 g/dL (ref 6.0–8.5)

## 2018-10-29 LAB — LIPID PANEL
Chol/HDL Ratio: 3.4 ratio (ref 0.0–5.0)
Cholesterol, Total: 192 mg/dL (ref 100–199)
HDL: 56 mg/dL (ref >39)
LDL Calculated: 111 mg/dL — ABNORMAL HIGH (ref 0–99)
Triglycerides: 127 mg/dL (ref 0–149)
VLDL Cholesterol Cal: 25 mg/dL (ref 5–40)

## 2018-10-29 LAB — CBC
Hematocrit: 36.6 % — ABNORMAL LOW (ref 37.5–51.0)
Hemoglobin: 11.5 g/dL — ABNORMAL LOW (ref 13.0–17.7)
MCH: 22.5 pg — ABNORMAL LOW (ref 26.6–33.0)
MCHC: 31.4 g/dL — ABNORMAL LOW (ref 31.5–35.7)
MCV: 72 fL — ABNORMAL LOW (ref 79–97)
Platelets: 295 10*3/uL (ref 150–450)
RBC: 5.11 x10E6/uL (ref 4.14–5.80)
RDW: 16.1 % — ABNORMAL HIGH (ref 11.6–15.4)
WBC: 5 10*3/uL (ref 3.4–10.8)

## 2018-10-30 ENCOUNTER — Encounter: Payer: Self-pay | Admitting: Family Medicine

## 2018-10-30 MED ORDER — COLCHICINE 0.6 MG PO TABS: ORAL_TABLET | ORAL | 3 refills | Status: DC

## 2018-10-30 MED ORDER — METOPROLOL SUCCINATE ER 25 MG PO TB24: 25.0000 mg | ORAL_TABLET | Freq: Every day | ORAL | 1 refills | Status: DC

## 2018-10-30 MED ORDER — LISINOPRIL 40 MG PO TABS: 40.0000 mg | ORAL_TABLET | Freq: Every day | ORAL | 1 refills | Status: DC

## 2018-10-30 MED ORDER — ALLOPURINOL 100 MG PO TABS: 50.0000 mg | ORAL_TABLET | Freq: Every day | ORAL | 1 refills | Status: DC

## 2018-10-30 MED ORDER — AMLODIPINE BESYLATE 10 MG PO TABS: 10.0000 mg | ORAL_TABLET | Freq: Every day | ORAL | 1 refills | Status: DC

## 2018-10-30 MED ORDER — PRAVASTATIN SODIUM 40 MG PO TABS: 40.0000 mg | ORAL_TABLET | Freq: Every evening | ORAL | 1 refills | Status: DC

## 2018-11-10 ENCOUNTER — Other Ambulatory Visit: Payer: Self-pay | Admitting: Physician Assistant

## 2018-11-10 DIAGNOSIS — E876 Hypokalemia: Secondary | ICD-10-CM

## 2018-11-28 ENCOUNTER — Encounter: Payer: Self-pay | Admitting: Family Medicine

## 2018-11-28 ENCOUNTER — Other Ambulatory Visit: Payer: Self-pay

## 2018-11-28 ENCOUNTER — Ambulatory Visit (INDEPENDENT_AMBULATORY_CARE_PROVIDER_SITE_OTHER): Payer: Medicare HMO | Admitting: Family Medicine

## 2018-11-28 VITALS — BP 177/94 | HR 65 | Temp 98.8°F | Resp 17 | Ht 73.0 in | Wt 327.8 lb

## 2018-11-28 DIAGNOSIS — G4733 Obstructive sleep apnea (adult) (pediatric): Secondary | ICD-10-CM

## 2018-11-28 DIAGNOSIS — N183 Chronic kidney disease, stage 3 unspecified: Secondary | ICD-10-CM

## 2018-11-28 DIAGNOSIS — I10 Essential (primary) hypertension: Secondary | ICD-10-CM

## 2018-11-28 DIAGNOSIS — I5032 Chronic diastolic (congestive) heart failure: Secondary | ICD-10-CM | POA: Diagnosis not present

## 2018-11-28 MED ORDER — SPIRONOLACTONE 25 MG PO TABS: 25.0000 mg | ORAL_TABLET | Freq: Every day | ORAL | 0 refills | Status: DC

## 2018-11-28 NOTE — Progress Notes (Signed)
Established Patient Office Visit  Subjective:  Patient ID: Nathan Grant, male    DOB: 20-Nov-1945  Age: 73 y.o. MRN: [id number]  CC:  Chief Complaint  Patient presents with  . Hypertension    1 month f/u    HPI Nathan Grant presents for   Hypertension: Patient here for follow-up of elevated blood pressure. He is not exercising and is adherent to low salt diet.  Blood pressure is not checked at home. Cardiac symptoms none. Patient denies chest pain, claudication, dyspnea, exertional chest pressure/discomfort, irregular heart beat, lower extremity edema, near-syncope and palpitations.  Cardiovascular risk factors: dyslipidemia and hypertension. Use of agents associated with hypertension: none. History of target organ damage: none. BP Readings from Last 3 Encounters:  11/28/18 (!) 177/94  10/28/18 (!) 186/102  05/11/18 (!) 201/103   OSA He reports that he has never had a sleep study He gets about 4 hours of sleep He has daytime fatigue He does not recall snoring or any complaints He stays busy so he does not really feel tired  He would dozes off easily if he is not busy He has a dry cough at night  CKD STAGE 3 Lab Results  Component Value Date   CREATININE 1.76 (H) 10/28/2018   Patient reports that he has not followed up with NEPHROLOGY as referred by me but the referral is still open. He is not taking any nsaids. He does not drink alcohol. He sticks to a good diet. Lab Results  Component Value Date   K 4.1 10/28/2018     Past Medical History:  Diagnosis Date  . Atrial flutter ([place])    s/p cardioversion 06/2010  . Bradycardia    a. Accelerated junctional & sinus bradycardia during 01/2013 adm.  . Chronic headaches   . Degenerative joint disease     End-stage degenerative joint disease, left knee  . Erectile dysfunction   . HTN (hypertension)   . Hypertensive heart disease    a. Admit for CP 01/2013 felt due to this. Cath with normal coronary anatomy.  .  Microcytic anemia  03/22/2001  . Morbid obesity ([place])   . NSVT (nonsustained ventricular tachycardia) ([place])    a. During 01/2013 adm.  . Obesity   . Osteoarthritis   . Tendonitis   . Tobacco chew use     History of chewing tobacco usage.   . Urinary tract infection     Pseudomonas urinary tract infection    Past Surgical History:  Procedure Laterality Date  . CARDIOVERSION  07/01/2010  . LEFT HEART CATHETERIZATION WITH CORONARY ANGIOGRAM N/A 01/16/2013   Procedure: LEFT HEART CATHETERIZATION WITH CORONARY ANGIOGRAM;  Surgeon: Peter M Martinique, MD;  Location[facility] CATH LAB;  Service: Cardiovascular;  Laterality: N/A;  . TOTAL KNEE ARTHROPLASTY  May 2009   Bilateral  . VASECTOMY      Family History  Problem Relation Age of Onset  . Alcohol abuse Brother   . Hypertension Brother   . Hypertension Mother     Social History   Socioeconomic History  . Marital status: Married    Spouse name: Not on file  . Number of children: 6  . Years of education: Not on file  . Highest education level: Not on file  Occupational History  . Occupation: Day Care    Employer: RETIRED  Social Needs  . Financial resource strain: Not on file  . Food insecurity    Worry: Not on file    Inability: Not on file  .  Transportation needs    Medical: Not on file    Non-medical: Not on file  Tobacco Use  . Smoking status: Never Smoker  . Smokeless tobacco: Current User    Types: Chew  Substance and Sexual Activity  . Alcohol use: No  . Drug use: No  . Sexual activity: Yes  Lifestyle  . Physical activity    Days per week: Not on file    Minutes per session: Not on file  . Stress: Not on file  Relationships  . Social Herbalist on phone: Not on file    Gets together: Not on file    Attends religious service: Not on file    Active member of club or organization: Not on file    Attends meetings of clubs or organizations: Not on file    Relationship status: Not on file  . Intimate  partner violence    Fear of current or ex partner: Not on file    Emotionally abused: Not on file    Physically abused: Not on file    Forced sexual activity: Not on file  Other Topics Concern  . Not on file  Social History Narrative  . Not on file    Outpatient Medications Prior to Visit  Medication Sig Dispense Refill  . allopurinol (ZYLOPRIM) 100 MG tablet Take 0.5 tablets (50 mg total) by mouth daily. 45 tablet 1  . amLODipine (NORVASC) 10 MG tablet Take 1 tablet (10 mg total) by mouth daily. 90 tablet 1  . aspirin EC 81 MG tablet Take 1 tablet (81 mg total) by mouth daily. 90 tablet 1  . colchicine 0.6 MG tablet Take 0.6mg  three times a day on day one of gout flare then after that take 0.6mg  by mouth twice daily until gout flare stops. 30 tablet 3  . fluticasone (FLONASE) 50 MCG/ACT nasal spray Place 2 sprays into both nostrils daily. 16 g 6  . lisinopril (ZESTRIL) 40 MG tablet Take 1 tablet (40 mg total) by mouth daily. 90 tablet 1  . metoprolol succinate (TOPROL-XL) 25 MG 24 hr tablet Take 1 tablet (25 mg total) by mouth daily. 90 tablet 1  . Multiple Vitamins-Minerals (CENTRUM SILVER PO) Take 1 tablet by mouth daily.    . pravastatin (PRAVACHOL) 40 MG tablet Take 1 tablet (40 mg total) by mouth every evening. 90 tablet 1  . potassium chloride SA (K-DUR,KLOR-CON) 20 MEQ tablet Take 1 tablet (20 mEq total) by mouth daily. 90 tablet 3  . sucralfate (CARAFATE) 1 GM/10ML suspension Take 10 mLs (1 g total) by mouth 4 (four) times daily -  with meals and at bedtime. (Patient not taking: Reported on 11/28/2018) 420 mL 0   No facility-administered medications prior to visit.     Allergies  Allergen Reactions  . Aspirin   . Ibuprofen   . Tylenol [Acetaminophen]     ROS Review of Systems Review of Systems  Constitutional: Negative for activity change, appetite change, chills and fever.  HENT: Negative for congestion, nosebleeds, trouble swallowing and voice change.   Respiratory:  Negative for cough, shortness of breath and wheezing.   Gastrointestinal: Negative for diarrhea, nausea and vomiting.  Genitourinary: Negative for difficulty urinating, dysuria, flank pain and hematuria.  Musculoskeletal: Negative for back pain, joint swelling and neck pain.  Neurological: Negative for dizziness, speech difficulty, light-headedness and numbness.  See HPI. All other review of systems negative.     Objective:    Physical Exam  BP [name])  177/94 (BP Location: Right Arm, Patient Position: Sitting, Cuff Size: Large)   Pulse 65   Temp 98.8 F (37.1 C) (Oral)   Resp 17   Ht 6\' 1"  (1.854 m)   Wt (!) 327 lb 12.8 oz (148.7 kg)   SpO2 99%   BMI 43.25 kg/m  Wt Readings from Last 3 Encounters:  11/28/18 (!) 327 lb 12.8 oz (148.7 kg)  10/28/18 (!) 326 lb 6.4 oz (148.1 kg)  05/03/18 (!) 318 lb (144.2 kg)   Physical Exam  Constitutional: Oriented to person, place, and time. Appears well-developed and well-nourished.  HENT:  Head: Normocephalic and atraumatic.  Eyes: Conjunctivae and EOM are normal.  Cardiovascular: Normal rate, regular rhythm, normal heart sounds and intact distal pulses.  No murmur heard. Pulmonary/Chest: Effort normal and breath sounds normal. No stridor. No respiratory distress. Has no wheezes.  Extremities: 2+ pitting edema Neurological: Is alert and oriented to person, place, and time.  Skin: Skin is warm. Capillary refill takes less than 2 seconds.  Psychiatric: Has a normal mood and affect. Behavior is normal. Judgment and thought content normal.    Health Maintenance Due  Topic Date Due  . OPHTHALMOLOGY EXAM  06/19/1954  . PNA vac Low Risk Adult (1 of 2 - PCV13) 06/18/2009  . INFLUENZA VACCINE  11/12/2018    There are no preventive care reminders to display for this patient.  No results found for: TSH Lab Results  Component Value Date   WBC 5.0 10/28/2018   HGB 11.5 (L) 10/28/2018   HCT 36.6 (L) 10/28/2018   MCV 72 (L) 10/28/2018   PLT  295 10/28/2018   Lab Results  Component Value Date   NA 140 10/28/2018   K 4.1 10/28/2018   CO2 25 10/28/2018   GLUCOSE 82 10/28/2018   BUN 15 10/28/2018   CREATININE 1.76 (H) 10/28/2018   BILITOT 0.4 10/28/2018   ALKPHOS 51 10/28/2018   AST 23 10/28/2018   ALT 18 10/28/2018   PROT 6.3 10/28/2018   ALBUMIN 4.0 10/28/2018   CALCIUM 9.2 10/28/2018   ANIONGAP 11 12/03/2017   GFR 66.34 07/07/2013   Lab Results  Component Value Date   CHOL 192 10/28/2018   Lab Results  Component Value Date   HDL 56 10/28/2018   Lab Results  Component Value Date   LDLCALC 111 (H) 10/28/2018   Lab Results  Component Value Date   TRIG 127 10/28/2018   Lab Results  Component Value Date   CHOLHDL 3.4 10/28/2018   Lab Results  Component Value Date   HGBA1C 6.1 (A) 10/28/2018      Assessment & Plan:   Problem List Items Addressed This Visit      Cardiovascular and Mediastinum   Chronic diastolic CHF (congestive heart failure) (HCC) Continue current meds and furosemide  Will add spironolactone   Relevant Medications   spironolactone (ALDACTONE) 25 MG tablet   Other Relevant Orders   PSG SLEEP STUDY   Uncontrolled hypertension - Primary Will add spironolactone to current meds Stopped patient from taking Potassium   Relevant Medications   spironolactone (ALDACTONE) 25 MG tablet   Other Relevant Orders   PSG SLEEP STUDY     Respiratory   OSA (obstructive sleep apnea)- discussed sleep apnea Advised pt to get PSG so he can be set up for OSA treatment as that will worsen his COMORBID conditions   Relevant Orders   PSG SLEEP STUDY     Genitourinary   CKD (chronic kidney disease)  stage 3, GFR 30-59 ml/min (HCC)  - follow up with Nephrology  Continue current meds Added spironolactone      Meds ordered this encounter  Medications  . DISCONTD: spironolactone (ALDACTONE) 25 MG tablet    Sig: Take 1 tablet (25 mg total) by mouth daily.    Dispense:  90 tablet    Refill:  0   . spironolactone (ALDACTONE) 25 MG tablet    Sig: Take 1 tablet (25 mg total) by mouth daily.    Dispense:  90 tablet    Refill:  0    Follow-up: No follow-ups on file.    Forrest Moron, MD

## 2018-11-28 NOTE — Patient Instructions (Addendum)
1. Sleep disturbances can be treated by treating the sleep apnea. Please follow up for a sleep study.   2. Your new medication spironolactone causes the potassium to be elevated. Stop taking the potassium when you start this medications  3. Follow up in six weeks for blood pressure recheck  4.  CALL DR. PATEL AT [place] AT [phone]       If you have lab work done today you will be contacted with your lab results within the next 2 weeks.  If you have not heard from [place] then please contact us. The fastest way to get your results is to register for My Chart.   IF you received an x-ray today, you will receive an invoice from [facility]. Please contact [facility] at [phone] with questions or concerns regarding your invoice.   IF you received labwork today, you will receive an invoice from [place]. Please contact [facility] at [phone] with questions or concerns regarding your invoice.   Our billing staff will not be able to assist you with questions regarding bills from these companies.  You will be contacted with the lab results as soon as they are available. The fastest way to get your results is to activate your My Chart account. Instructions are located on the last page of this paperwork. If you have not heard from [place] regarding the results in 2 weeks, please contact this office.     Sleep Apnea Sleep apnea is a condition in which breathing pauses or becomes shallow during sleep. Episodes of sleep apnea usually last 10 seconds or longer, and they may occur as many as 20 times an hour. Sleep apnea disrupts your sleep and keeps your body from getting the rest that it needs. This condition can increase your risk of certain health problems, including:  Heart attack.  Stroke.  Obesity.  Diabetes.  Heart failure.  Irregular heartbeat. What are the causes? There are three kinds of sleep apnea:  Obstructive sleep apnea. This kind is caused by a  blocked or collapsed airway.  Central sleep apnea. This kind happens when the part of the brain that controls breathing does not send the correct signals to the muscles that control breathing.  Mixed sleep apnea. This is a combination of obstructive and central sleep apnea. The most common cause of this condition is a collapsed or blocked airway. An airway can collapse or become blocked if:  Your throat muscles are abnormally relaxed.  Your tongue and tonsils are larger than normal.  You are overweight.  Your airway is smaller than normal. What increases the risk? You are more likely to develop this condition if you:  Are overweight.  Smoke.  Have a smaller than normal airway.  Are elderly.  Are male.  Drink alcohol.  Take sedatives or tranquilizers.  Have a family history of sleep apnea. What are the signs or symptoms? Symptoms of this condition include:  Trouble staying asleep.  Daytime sleepiness and tiredness.  Irritability.  Loud snoring.  Morning headaches.  Trouble concentrating.  Forgetfulness.  Decreased interest in sex.  Unexplained sleepiness.  Mood swings.  Personality changes.  Feelings of depression.  Waking up often during the night to urinate.  Dry mouth.  Sore throat. How is this diagnosed? This condition may be diagnosed with:  A medical history.  A physical exam.  A series of tests that are done while you are sleeping (sleep study). These tests are usually done in a sleep lab, but they may  also be done at home. How is this treated? Treatment for this condition aims to restore normal breathing and to ease symptoms during sleep. It may involve managing health issues that can affect breathing, such as high blood pressure or obesity. Treatment may include:  Sleeping on your side.  Using a decongestant if you have nasal congestion.  Avoiding the use of depressants, including alcohol, sedatives, and narcotics.  Losing  weight if you are overweight.  Making changes to your diet.  Quitting smoking.  Using a device to open your airway while you sleep, such as: ? An oral appliance. This is a custom-made mouthpiece that shifts your lower jaw forward. ? A continuous positive airway pressure (CPAP) device. This device blows air through a mask when you breathe out (exhale). ? A nasal expiratory positive airway pressure (EPAP) device. This device has valves that you put into each nostril. ? A bi-level positive airway pressure (BPAP) device. This device blows air through a mask when you breathe in (inhale) and breathe out (exhale).  Having surgery if other treatments do not work. During surgery, excess tissue is removed to create a wider airway. It is important to get treatment for sleep apnea. Without treatment, this condition can lead to:  High blood pressure.  Coronary artery disease.  In men, an inability to achieve or maintain an erection (impotence).  Reduced thinking abilities. Follow these instructions at home: Lifestyle  Make any lifestyle changes that your health care provider recommends.  Eat a healthy, well-balanced diet.  Take steps to lose weight if you are overweight.  Avoid using depressants, including alcohol, sedatives, and narcotics.  Do not use any products that contain nicotine or tobacco, such as cigarettes, e-cigarettes, and chewing tobacco. If you need help quitting, ask your health care provider. General instructions  Take over-the-counter and prescription medicines only as told by your health care provider.  If you were given a device to open your airway while you sleep, use it only as told by your health care provider.  If you are having surgery, make sure to tell your health care provider you have sleep apnea. You may need to bring your device with you.  Keep all follow-up visits as told by your health care provider. This is important. Contact a health care provider  if:  The device that you received to open your airway during sleep is uncomfortable or does not seem to be working.  Your symptoms do not improve.  Your symptoms get worse. Get help right away if:  You develop: ? Chest pain. ? Shortness of breath. ? Discomfort in your back, arms, or stomach.  You have: ? Trouble speaking. ? Weakness on one side of your body. ? Drooping in your face. These symptoms may represent a serious problem that is an emergency. Do not wait to see if the symptoms will go away. Get medical help right away. Call your local emergency services (911 in the [place] Do not drive yourself to the hospital. Summary  Sleep apnea is a condition in which breathing pauses or becomes shallow during sleep.  The most common cause is a collapsed or blocked airway.  The goal of treatment is to restore normal breathing and to ease symptoms during sleep. This information is not intended to replace advice given to you by your health care provider. Make sure you discuss any questions you have with your health care provider. Document Released: 03/20/2002 Document Revised: 01/14/2018 Document Reviewed: 11/23/2017 Elsevier Patient Education  2020 Elsevier  Inc.  

## 2018-12-15 ENCOUNTER — Other Ambulatory Visit: Payer: Self-pay | Admitting: Family Medicine

## 2018-12-15 DIAGNOSIS — Z13220 Encounter for screening for lipoid disorders: Secondary | ICD-10-CM

## 2018-12-15 DIAGNOSIS — I10 Essential (primary) hypertension: Secondary | ICD-10-CM

## 2018-12-15 DIAGNOSIS — I5032 Chronic diastolic (congestive) heart failure: Secondary | ICD-10-CM

## 2018-12-15 DIAGNOSIS — M1A39X0 Chronic gout due to renal impairment, multiple sites, without tophus (tophi): Secondary | ICD-10-CM

## 2018-12-15 MED ORDER — ALLOPURINOL 100 MG PO TABS: 50.0000 mg | ORAL_TABLET | Freq: Every day | ORAL | 1 refills | Status: DC

## 2018-12-15 MED ORDER — LISINOPRIL 40 MG PO TABS: 40.0000 mg | ORAL_TABLET | Freq: Every day | ORAL | 1 refills | Status: DC

## 2018-12-15 MED ORDER — METOPROLOL SUCCINATE ER 25 MG PO TB24: 25.0000 mg | ORAL_TABLET | Freq: Every day | ORAL | 1 refills | Status: DC

## 2018-12-15 MED ORDER — COLCHICINE 0.6 MG PO TABS: ORAL_TABLET | ORAL | 3 refills | Status: DC

## 2018-12-15 MED ORDER — SPIRONOLACTONE 25 MG PO TABS: 25.0000 mg | ORAL_TABLET | Freq: Every day | ORAL | 0 refills | Status: DC

## 2018-12-15 MED ORDER — PRAVASTATIN SODIUM 40 MG PO TABS: 40.0000 mg | ORAL_TABLET | Freq: Every evening | ORAL | 1 refills | Status: DC

## 2018-12-15 MED ORDER — AMLODIPINE BESYLATE 10 MG PO TABS: 10.0000 mg | ORAL_TABLET | Freq: Every day | ORAL | 1 refills | Status: DC

## 2018-12-15 NOTE — Telephone Encounter (Signed)
Medication Refill - Medication: allopurinol (ZYLOPRIM) 100 MG tablet/amLODipine (NORVASC) 10 MG tablet/colchicine 0.6 MG tablet/lisinopril (ZESTRIL) 40 MG tablet/metoprolol succinate (TOPROL-XL) 25 MG 24 hr tablet/pravastatin (PRAVACHOL) 40 MG tablet/spironolactone (ALDACTONE) 25 MG tablet Pt's pharmacy called regarding refills. Stated they also faxed over 2 requests.  Has the patient contacted their pharmacy? Yes.   (Agent: If no, request that the patient contact the pharmacy for the refill.) Preferred Pharmacy (with phone number or street name):  [place], [place] [phone] (Phone) [phone] (Fax)     Agent: Please be advised that RX refills may take up to 3 business days. We ask that you follow-up with your pharmacy.

## 2018-12-23 DIAGNOSIS — R609 Edema, unspecified: Secondary | ICD-10-CM | POA: Diagnosis not present

## 2018-12-23 DIAGNOSIS — N390 Urinary tract infection, site not specified: Secondary | ICD-10-CM | POA: Diagnosis not present

## 2018-12-23 DIAGNOSIS — I129 Hypertensive chronic kidney disease with stage 1 through stage 4 chronic kidney disease, or unspecified chronic kidney disease: Secondary | ICD-10-CM | POA: Diagnosis not present

## 2018-12-23 DIAGNOSIS — E669 Obesity, unspecified: Secondary | ICD-10-CM | POA: Diagnosis not present

## 2018-12-23 DIAGNOSIS — D631 Anemia in chronic kidney disease: Secondary | ICD-10-CM | POA: Diagnosis not present

## 2018-12-23 DIAGNOSIS — N183 Chronic kidney disease, stage 3 (moderate): Secondary | ICD-10-CM | POA: Diagnosis not present

## 2018-12-23 DIAGNOSIS — R69 Illness, unspecified: Secondary | ICD-10-CM | POA: Diagnosis not present

## 2019-01-04 ENCOUNTER — Other Ambulatory Visit: Payer: Self-pay | Admitting: Family Medicine

## 2019-01-04 ENCOUNTER — Ambulatory Visit: Payer: Self-pay | Admitting: *Deleted

## 2019-01-04 DIAGNOSIS — I10 Essential (primary) hypertension: Secondary | ICD-10-CM

## 2019-01-04 MED ORDER — LISINOPRIL 40 MG PO TABS: 40.0000 mg | ORAL_TABLET | Freq: Every day | ORAL | 1 refills | Status: DC

## 2019-01-04 NOTE — Telephone Encounter (Signed)
Summary: Wheezing   Pt stated he has been experiencing wheezing for the last 3 days. He is not sure what the cause is. Please advise.      Call to office- holding for over 6-7 minutes. Patient has disconnected. They are going to check with provider/FC and they will call him back to schedule.  Reason for Disposition . [1] MILD difficulty breathing (e.g., minimal/no SOB at rest, SOB with walking, pulse <100) AND [2] NEW-onset or WORSE than normal  Answer Assessment - Initial Assessment Questions 1. RESPIRATORY STATUS: "Describe your breathing?" (e.g., wheezing, shortness of breath, unable to speak, severe coughing)      Laying on side causes wheezing, coughing at night 2. ONSET: "When did this breathing problem begin?"      3 days ago 3. PATTERN "Does the difficult breathing come and go, or has it been constant since it started?"      Comes and goes, dry cough 4. SEVERITY: "How bad is your breathing?" (e.g., mild, moderate, severe)    - MILD: No SOB at rest, mild SOB with walking, speaks normally in sentences, can lay down, no retractions, pulse < 100.    - MODERATE: SOB at rest, SOB with minimal exertion and prefers to sit, cannot lie down flat, speaks in phrases, mild retractions, audible wheezing, pulse 100-120.    - SEVERE: Very SOB at rest, speaks in single words, struggling to breathe, sitting hunched forward, retractions, pulse > 120      mild 5. RECURRENT SYMPTOM: "Have you had difficulty breathing before?" If so, ask: "When was the last time?" and "What happened that time?"      No- never before 6. CARDIAC HISTORY: "Do you have any history of heart disease?" (e.g., heart attack, angina, bypass surgery, angioplasty)      Enlarged heart 7. LUNG HISTORY: "Do you have any history of lung disease?"  (e.g., pulmonary embolus, asthma, emphysema)     no 8. CAUSE: "What do you think is causing the breathing problem?"      Room- dry air 9. OTHER SYMPTOMS: "Do you have any other symptoms?  (e.g., dizziness, runny nose, cough, chest pain, fever)     cough 10. PREGNANCY: "Is there any chance you are pregnant?" "When was your last menstrual period?"       n/a 11. TRAVEL: "Have you traveled out of the country in the last month?" (e.g., travel history, exposures)       no  Protocols used: BREATHING DIFFICULTY-A-AH

## 2019-01-04 NOTE — Telephone Encounter (Signed)
Medication Refill - Medication: lisinopril (ZESTRIL) 40 MG tablet  Has the patient contacted their pharmacy? Yes.   (Agent: If no, request that the patient contact the pharmacy for the refill.) (Agent: If yes, when and what did the pharmacy advise?)  Preferred Pharmacy (with phone number or street name):  [place], [place]. [phone] (Phone) [phone] (Fax)     Agent: Please be advised that RX refills may take up to 3 business days. We ask that you follow-up with your pharmacy.

## 2019-01-05 ENCOUNTER — Telehealth (INDEPENDENT_AMBULATORY_CARE_PROVIDER_SITE_OTHER): Payer: Medicare HMO | Admitting: Adult Health Nurse Practitioner

## 2019-01-05 ENCOUNTER — Encounter: Payer: Self-pay | Admitting: Adult Health Nurse Practitioner

## 2019-01-05 DIAGNOSIS — R062 Wheezing: Secondary | ICD-10-CM | POA: Diagnosis not present

## 2019-01-05 DIAGNOSIS — R0982 Postnasal drip: Secondary | ICD-10-CM

## 2019-01-05 NOTE — Progress Notes (Signed)
Telemedicine Encounter- SOAP NOTE Established Patient  This telephone encounter was conducted with the patient's (or proxy's) verbal consent via audio telecommunications: yes/no: Yes Patient was instructed to have this encounter in a suitably private space; and to only have persons present to whom they give permission to participate. In addition, patient identity was confirmed by use of name plus two identifiers (DOB and address).  I discussed the limitations, risks, security and privacy concerns of performing an evaluation and management service by telephone and the availability of in person appointments. I also discussed with the patient that there may be a patient responsible charge related to this service. The patient expressed understanding and agreed to proceed.  I spent a total of TIME; 0 MIN TO 60 MIN: 25 minutes talking with the patient or their proxy.  No chief complaint on file.   Subjective   Nathan Grant is a 73 y.o. established patient. Telephone visit today for wheezing at night   HPI   Nathan Grant reports that he had somewhat of a cough last week that has resolved. Over the past 3 days he notes that he is having a throat drip with standing resulting in him having to clear his throat.  Feels that it starts in his upper larynyx and progressing into his throat causing him to have to clear his throat and swallow.  This occurs during the day and night.  At night, he notes that if he lies on his side, he is having wheezing.  It is not associated with SOB although he does have a hx of PND.  He cannot sleep well.  Normally sleeps on 2 pillows which is unchanged currently.  Wheezing is not present during the day.  At night, the wheezing is moderate and intermittent and resolves when he lies on his back.  No change in temperature.  No body aches, chills, or fevers.  No swelling in feet or ankles.  In fact, swelling has dramatically improved after recent visit with Dr. Nolon Rod and  new meds he started at that appointment.    Patient Active Problem List   Diagnosis Date Noted  . Type 2 diabetes mellitus with stage 3 chronic kidney disease, with long-term current use of insulin ([place]) 04/28/2018  . Uricacidemia 04/28/2018  . Foot callus 04/28/2018  . Onychodystrophy 04/28/2018  . PND (paroxysmal nocturnal dyspnea) 03/08/2018  . Enuresis 03/08/2018  . Chronic gout due to renal impairment of multiple sites without tophus 03/08/2018  . OSA (obstructive sleep apnea) 03/08/2018  . CKD stage 3 due to type 2 diabetes mellitus ([place]) 03/08/2018  . Uncontrolled hypertension 03/08/2018  . Chronic diastolic CHF (congestive heart failure) ([place]) 09/06/2014  . CKD (chronic kidney disease) stage 3, GFR 30-59 ml/min ([facility]) 09/06/2014  . Hypercholesterolemia 12/21/2013  . HTN (hypertension) 01/17/2013  . NSVT (nonsustained ventricular tachycardia) ([place]) 01/17/2013  . Accelerated junctional rhythm 01/17/2013  . Sinus bradycardia 01/17/2013  . Diastolic dysfunction 79/05/4095  . Acute on chronic renal failure ([place]) 05/30/2012  . Osteoarthritis   . Hypokalemia   . Erectile dysfunction   . Chronic headaches   . Hyperlipidemia   . Morbid obesity ([place])   . Atrial flutter ([place])   . Hypertensive heart disease   . Microcytic anemia 03/22/2001    Past Medical History:  Diagnosis Date  . Atrial flutter ([place])    s/p cardioversion 06/2010  . Bradycardia    a. Accelerated junctional & sinus bradycardia during 01/2013 adm.  . Chronic headaches   .  Degenerative joint disease     End-stage degenerative joint disease, left knee  . Erectile dysfunction   . HTN (hypertension)   . Hypertensive heart disease    a. Admit for CP 01/2013 felt due to this. Cath with normal coronary anatomy.  . Microcytic anemia  03/22/2001  . Morbid obesity ([place])   . NSVT (nonsustained ventricular tachycardia) ([place])    a. During 01/2013 adm.  . Obesity   . Osteoarthritis   . Tendonitis   . Tobacco chew  use     History of chewing tobacco usage.   . Urinary tract infection     Pseudomonas urinary tract infection    Current Outpatient Medications  Medication Sig Dispense Refill  . allopurinol (ZYLOPRIM) 100 MG tablet Take 0.5 tablets (50 mg total) by mouth daily. 45 tablet 1  . amLODipine (NORVASC) 10 MG tablet Take 1 tablet (10 mg total) by mouth daily. 90 tablet 1  . aspirin EC 81 MG tablet Take 1 tablet (81 mg total) by mouth daily. 90 tablet 1  . colchicine 0.6 MG tablet Take 0.6mg  three times a day on day one of gout flare then after that take 0.6mg  by mouth twice daily until gout flare stops. 30 tablet 3  . fluticasone (FLONASE) 50 MCG/ACT nasal spray Place 2 sprays into both nostrils daily. 16 g 6  . lisinopril (ZESTRIL) 40 MG tablet Take 1 tablet (40 mg total) by mouth daily. 90 tablet 1  . metoprolol succinate (TOPROL-XL) 25 MG 24 hr tablet Take 1 tablet (25 mg total) by mouth daily. 90 tablet 1  . Multiple Vitamins-Minerals (CENTRUM SILVER PO) Take 1 tablet by mouth daily.    . pravastatin (PRAVACHOL) 40 MG tablet Take 1 tablet (40 mg total) by mouth every evening. 90 tablet 1  . spironolactone (ALDACTONE) 25 MG tablet Take 1 tablet (25 mg total) by mouth daily. 90 tablet 0  . sucralfate (CARAFATE) 1 GM/10ML suspension Take 10 mLs (1 g total) by mouth 4 (four) times daily -  with meals and at bedtime. (Patient not taking: Reported on 11/28/2018) 420 mL 0   No current facility-administered medications for this visit.     Allergies  Allergen Reactions  . Aspirin   . Ibuprofen   . Tylenol [Acetaminophen]     Social History   Socioeconomic History  . Marital status: Married    Spouse name: Not on file  . Number of children: 6  . Years of education: Not on file  . Highest education level: Not on file  Occupational History  . Occupation: Day Care    Employer: RETIRED  Social Needs  . Financial resource strain: Not on file  . Food insecurity    Worry: Not on file     Inability: Not on file  . Transportation needs    Medical: Not on file    Non-medical: Not on file  Tobacco Use  . Smoking status: Never Smoker  . Smokeless tobacco: Current User    Types: Chew  Substance and Sexual Activity  . Alcohol use: No  . Drug use: No  . Sexual activity: Yes  Lifestyle  . Physical activity    Days per week: Not on file    Minutes per session: Not on file  . Stress: Not on file  Relationships  . Social Herbalist on phone: Not on file    Gets together: Not on file    Attends religious service: Not on file  Active member of club or organization: Not on file    Attends meetings of clubs or organizations: Not on file    Relationship status: Not on file  . Intimate partner violence    Fear of current or ex partner: Not on file    Emotionally abused: Not on file    Physically abused: Not on file    Forced sexual activity: Not on file  Other Topics Concern  . Not on file  Social History Narrative  . Not on file    Review of Systems  Constitutional: Positive for malaise/fatigue. Negative for chills, fever and weight loss.  Respiratory: Positive for sputum production and wheezing. Negative for cough.   Cardiovascular: Negative for chest pain, palpitations, orthopnea, leg swelling and PND.  Gastrointestinal: Negative for constipation, diarrhea and heartburn.  Neurological: Negative.   Psychiatric/Behavioral: Negative.     Objective    GEN: WDWN, NAD,  Alert & Oriented x 3 Resp:  No SOB when conversant.  PSYCH: Normally interactive. Conversant. Not depressed or anxious appearing.  Calm demeanor.    Vitals as reported by the patient: There were no vitals filed for this visit.  Diagnoses and all orders for this visit:  Wheezing  Post-nasal drip    Discussed the patient's symptoms.  He is not taking his Flonase regularly.  Encouraged bid use for 7 days for the post-nasal drip.  He agreed to try.  I consulted with Dr. Nolon Rod  regarding the wheezing.  She suggested that he may want to sleep propped up more, at least at 30 degrees.  I communicated that with the patient and he states he will try.  He will call or return if symptoms persist or worsen.  Patient is inline with this plan.    I discussed the assessment and treatment plan with the patient. The patient was provided an opportunity to ask questions and all were answered. The patient agreed with the plan and demonstrated an understanding of the instructions.   The patient was advised to call back or seek an in-person evaluation if the symptoms worsen or if the condition fails to improve as anticipated.  I provided 87minutes of non-face-to-face time during this encounter.  Glyn Ade, NP  Primary Care at [facility]

## 2019-01-05 NOTE — Telephone Encounter (Signed)
Pt needs telemed appt

## 2019-01-16 DIAGNOSIS — I129 Hypertensive chronic kidney disease with stage 1 through stage 4 chronic kidney disease, or unspecified chronic kidney disease: Secondary | ICD-10-CM | POA: Diagnosis not present

## 2019-01-16 DIAGNOSIS — N183 Chronic kidney disease, stage 3 unspecified: Secondary | ICD-10-CM | POA: Diagnosis not present

## 2019-01-23 ENCOUNTER — Other Ambulatory Visit: Payer: Self-pay | Admitting: Family Medicine

## 2019-01-27 ENCOUNTER — Ambulatory Visit (INDEPENDENT_AMBULATORY_CARE_PROVIDER_SITE_OTHER): Payer: Medicare HMO | Admitting: Family Medicine

## 2019-01-27 ENCOUNTER — Other Ambulatory Visit: Payer: Self-pay

## 2019-01-27 ENCOUNTER — Encounter: Payer: Self-pay | Admitting: Family Medicine

## 2019-01-27 VITALS — BP 168/90 | HR 58 | Temp 98.8°F | Ht 73.0 in | Wt 325.0 lb

## 2019-01-27 DIAGNOSIS — I110 Hypertensive heart disease with heart failure: Secondary | ICD-10-CM

## 2019-01-27 DIAGNOSIS — E1122 Type 2 diabetes mellitus with diabetic chronic kidney disease: Secondary | ICD-10-CM | POA: Diagnosis not present

## 2019-01-27 DIAGNOSIS — T464X5A Adverse effect of angiotensin-converting-enzyme inhibitors, initial encounter: Secondary | ICD-10-CM | POA: Diagnosis not present

## 2019-01-27 DIAGNOSIS — R05 Cough: Secondary | ICD-10-CM

## 2019-01-27 DIAGNOSIS — I5032 Chronic diastolic (congestive) heart failure: Secondary | ICD-10-CM | POA: Diagnosis not present

## 2019-01-27 DIAGNOSIS — N183 Chronic kidney disease, stage 3 unspecified: Secondary | ICD-10-CM

## 2019-01-27 DIAGNOSIS — G4733 Obstructive sleep apnea (adult) (pediatric): Secondary | ICD-10-CM | POA: Diagnosis not present

## 2019-01-27 DIAGNOSIS — I10 Essential (primary) hypertension: Secondary | ICD-10-CM

## 2019-01-27 DIAGNOSIS — R058 Other specified cough: Secondary | ICD-10-CM

## 2019-01-27 MED ORDER — LOSARTAN POTASSIUM 100 MG PO TABS: 100.0000 mg | ORAL_TABLET | Freq: Every day | ORAL | 2 refills | Status: DC

## 2019-01-27 NOTE — Progress Notes (Signed)
Established Patient Office Visit  Subjective:  Patient ID: Nathan Grant, male    DOB: Jan 02, 1946  Age: 73 y.o. MRN: [id number]  CC:  Chief Complaint  Patient presents with  . Follow-up    hypertension, sleep apnea    HPI Nathan Grant presents for   Hypertension: Patient here for follow-up of elevated blood pressure. He is exercising and is adherent to low salt diet.  Blood pressure is not checked at home and is not well controlled in the office. Cardiac symptoms orthopnea. Patient denies chest pain, chest pressure/discomfort, claudication, exertional chest pressure/discomfort and irregular heart beat.  Cardiovascular risk factors: diabetes mellitus, dyslipidemia, hypertension, male gender and obesity (BMI >= 30 kg/m2). Use of agents associated with hypertension: none. History of target organ damage: heart failure.    He has a persistent dry cough and feel like there is something in his throat He has been on lisinopril for years He has gout  He has CHF  CHF He reports that he gets a cough if he lays flat to sleep so he has to sleep propped up on 3 pills or he sits in a recliner to sleep. He denies lower extremity edema He is taking spironolactone, metoprolol, amlodipine and lisinopril  Morbid obesity He is walking for exercise and doing dietary modification He stopped eating chicken livers which also flared up his gout Body mass index is 42.88 kg/m.  Wt Readings from Last 3 Encounters:  01/27/19 (!) 325 lb (147.4 kg)  11/28/18 (!) 327 lb 12.8 oz (148.7 kg)  10/28/18 (!) 326 lb 6.4 oz (148.1 kg)    OSA Patient reports that he has snoring and has been told he has sleep apnea but has never gotten the machine or the test He has heart failure and uncontrolled hypertension He denies taking any sleep aids    Past Medical History:  Diagnosis Date  . Atrial flutter ([place])    s/p cardioversion 06/2010  . Bradycardia    a. Accelerated junctional & sinus bradycardia  during 01/2013 adm.  . Chronic headaches   . Degenerative joint disease     End-stage degenerative joint disease, left knee  . Erectile dysfunction   . HTN (hypertension)   . Hypertensive heart disease    a. Admit for CP 01/2013 felt due to this. Cath with normal coronary anatomy.  . Microcytic anemia  03/22/2001  . Morbid obesity ([place])   . NSVT (nonsustained ventricular tachycardia) ([place])    a. During 01/2013 adm.  . Obesity   . Osteoarthritis   . Tendonitis   . Tobacco chew use     History of chewing tobacco usage.   . Urinary tract infection     Pseudomonas urinary tract infection    Past Surgical History:  Procedure Laterality Date  . CARDIOVERSION  07/01/2010  . LEFT HEART CATHETERIZATION WITH CORONARY ANGIOGRAM N/A 01/16/2013   Procedure: LEFT HEART CATHETERIZATION WITH CORONARY ANGIOGRAM;  Surgeon: Nathan M Martinique, MD;  Location: [facility] CATH LAB;  Service: Cardiovascular;  Laterality: N/A;  . TOTAL KNEE ARTHROPLASTY  May 2009   Bilateral  . VASECTOMY      Family History  Problem Relation Age of Onset  . Alcohol abuse Brother   . Hypertension Brother   . Hypertension Mother     Social History   Socioeconomic History  . Marital status: Married    Spouse name: Not on file  . Number of children: 6  . Years of education: Not on file  . Highest  education level: Not on file  Occupational History  . Occupation: Day Care    Employer: RETIRED  Social Needs  . Financial resource strain: Not on file  . Food insecurity    Worry: Not on file    Inability: Not on file  . Transportation needs    Medical: Not on file    Non-medical: Not on file  Tobacco Use  . Smoking status: Never Smoker  . Smokeless tobacco: Current User    Types: Chew  Substance and Sexual Activity  . Alcohol use: No  . Drug use: No  . Sexual activity: Yes  Lifestyle  . Physical activity    Days per week: Not on file    Minutes per session: Not on file  . Stress: Not on file  Relationships  .  Social Herbalist on phone: Not on file    Gets together: Not on file    Attends religious service: Not on file    Active member of club or organization: Not on file    Attends meetings of clubs or organizations: Not on file    Relationship status: Not on file  . Intimate partner violence    Fear of current or ex partner: Not on file    Emotionally abused: Not on file    Physically abused: Not on file    Forced sexual activity: Not on file  Other Topics Concern  . Not on file  Social History Narrative  . Not on file    Outpatient Medications Prior to Visit  Medication Sig Dispense Refill  . allopurinol (ZYLOPRIM) 100 MG tablet Take 1 tablet by mouth once daily 30 tablet 0  . aspirin EC 81 MG tablet Take 1 tablet (81 mg total) by mouth daily. 90 tablet 1  . colchicine 0.6 MG tablet Take 0.50m three times a day on day one of gout flare then after that take 0.656mby mouth twice daily until gout flare stops. 30 tablet 3  . fluticasone (FLONASE) 50 MCG/ACT nasal spray Place 2 sprays into both nostrils daily. 16 g 6  . metoprolol succinate (TOPROL-XL) 25 MG 24 hr tablet Take 1 tablet (25 mg total) by mouth daily. 90 tablet 1  . Multiple Vitamins-Minerals (CENTRUM SILVER PO) Take 1 tablet by mouth daily.    . Marland Kitchenpironolactone (ALDACTONE) 25 MG tablet Take 1 tablet (25 mg total) by mouth daily. 90 tablet 0  . sucralfate (CARAFATE) 1 GM/10ML suspension Take 10 mLs (1 g total) by mouth 4 (four) times daily -  with meals and at bedtime. 420 mL 0  . amLODipine (NORVASC) 10 MG tablet Take 1 tablet (10 mg total) by mouth daily. 90 tablet 1  . lisinopril (ZESTRIL) 40 MG tablet Take 1 tablet (40 mg total) by mouth daily. 90 tablet 1  . pravastatin (PRAVACHOL) 40 MG tablet Take 1 tablet (40 mg total) by mouth every evening. 90 tablet 1   No facility-administered medications prior to visit.     Allergies  Allergen Reactions  . Aspirin   . Ibuprofen   . Tylenol [Acetaminophen]      ROS Review of Systems Review of Systems  Constitutional: Negative for activity change, appetite change, chills and fever.  HENT: Negative for congestion, nosebleeds, trouble swallowing and voice change.   Respiratory: see hpi Gastrointestinal: Negative for diarrhea, nausea and vomiting.  Genitourinary: Negative for difficulty urinating, dysuria, flank pain and hematuria. +nocturia Musculoskeletal: Negative for back pain, joint swelling and neck pain.  Neurological: Negative for dizziness, speech difficulty, light-headedness and numbness.  See HPI. All other review of systems negative.     Objective:    Physical Exam  BP (!) 168/90 (BP Location: Left Arm, Patient Position: Sitting, Cuff Size: Large)   Pulse (!) 58   Temp 98.8 F (37.1 C)   Ht 6' 1" (1.854 m)   Wt (!) 325 lb (147.4 kg)   SpO2 96%   BMI 42.88 kg/m  Wt Readings from Last 3 Encounters:  01/27/19 (!) 325 lb (147.4 kg)  11/28/18 (!) 327 lb 12.8 oz (148.7 kg)  10/28/18 (!) 326 lb 6.4 oz (148.1 kg)   Physical Exam  Constitutional: Oriented to person, place, and time. Appears well-developed and well-nourished.  HENT:  Head: Normocephalic and atraumatic.  Eyes: Conjunctivae and EOM are normal.  Cardiovascular: Normal rate, regular rhythm, normal heart sounds and intact distal pulses.  No murmur heard. Pulmonary/Chest: Effort normal and breath sounds normal. No stridor. No respiratory distress. Has no wheezes.  Neurological: Is alert and oriented to person, place, and time.  Skin: Skin is warm. Capillary refill takes less than 2 seconds.  Psychiatric: Has a normal mood and affect. Behavior is normal. Judgment and thought content normal.  Extremities: lower extremity edema trace to the mid-shin   Health Maintenance Due  Topic Date Due  . OPHTHALMOLOGY EXAM  06/19/1954  . PNA vac Low Risk Adult (1 of 2 - PCV13) 06/18/2009  . INFLUENZA VACCINE  11/12/2018    There are no preventive care reminders to display  for this patient.  No results found for: TSH Lab Results  Component Value Date   WBC 5.0 10/28/2018   HGB 11.5 (L) 10/28/2018   HCT 36.6 (L) 10/28/2018   MCV 72 (L) 10/28/2018   PLT 295 10/28/2018   Lab Results  Component Value Date   NA 140 10/28/2018   K 4.1 10/28/2018   CO2 25 10/28/2018   GLUCOSE 82 10/28/2018   BUN 15 10/28/2018   CREATININE 1.76 (H) 10/28/2018   BILITOT 0.4 10/28/2018   ALKPHOS 51 10/28/2018   AST 23 10/28/2018   ALT 18 10/28/2018   PROT 6.3 10/28/2018   ALBUMIN 4.0 10/28/2018   CALCIUM 9.2 10/28/2018   ANIONGAP 11 12/03/2017   GFR 66.34 07/07/2013   Lab Results  Component Value Date   CHOL 192 10/28/2018   Lab Results  Component Value Date   HDL 56 10/28/2018   Lab Results  Component Value Date   LDLCALC 111 (H) 10/28/2018   Lab Results  Component Value Date   TRIG 127 10/28/2018   Lab Results  Component Value Date   CHOLHDL 3.4 10/28/2018   Lab Results  Component Value Date   HGBA1C 6.1 (A) 10/28/2018      Assessment & Plan:   Problem List Items Addressed This Visit      Cardiovascular and Mediastinum   Hypertensive heart disease (Chronic)  - uncontrolled, will change lisinopril due to cough Pt may need hydralazine if necessary   Relevant Medications   losartan (COZAAR) 100 MG tablet   Other Relevant Orders   Ambulatory referral to Sleep Studies   Chronic diastolic CHF (congestive heart failure) (HCC) - Primary Discussed his heart failure meds and that he needs to remain on a class of drug like ACE inhibitor or ARB Since he has this dry cough will opt for ARB   Relevant Medications   losartan (COZAAR) 100 MG tablet   Other Relevant Orders     Ambulatory referral to Sleep Studies   CMP14+EGFR   Uncontrolled hypertension   Relevant Medications   losartan (COZAAR) 100 MG tablet   Other Relevant Orders   Ambulatory referral to Sleep Studies     Respiratory   OSA (obstructive sleep apnea)  - referral placed for sleep  study  Given his uncontrolled hypertension, orthopnea and his morbid obesity this is a MEDICAL NECESSITY Will pursue this to ensure this is done especially in the patient with heart failure   Relevant Orders   Ambulatory referral to Sleep Studies     Endocrine   CKD stage 3 due to type 2 diabetes mellitus ([place])  - discussed his renal function and discussed his trend in GFR He has stabilized as stage 3 Discussed that blood pressure and blood sugar control are the utmost insurance    Relevant Medications   losartan (COZAAR) 100 MG tablet   Other Relevant Orders   CMP14+EGFR    Other Visit Diagnoses    ACE-inhibitor cough    -  Changed medication to losartan and stopped lisinopril Discussed that after the change in medication he might need another class of drug to help with his blood pressure If his cough is unimproved then it is not an ace inhibitor cough Losartan with spironolactone may cause hyperkalemia Will follow K closely   Relevant Orders   CMP14+EGFR      Meds ordered this encounter  Medications  . losartan (COZAAR) 100 MG tablet    Sig: Take 1 tablet (100 mg total) by mouth daily.    Dispense:  30 tablet    Refill:  2    Follow-up: Return in about 4 weeks (around 02/24/2019) for 4 weeks for lab check then six weeks in the office with Dr. Nolon Rod.   A total of 45 minutes were spent face-to-face with the patient during this encounter and over half of that time was spent on counseling and coordination of care.  Forrest Moron, MD

## 2019-01-27 NOTE — Patient Instructions (Addendum)
If you have lab work done today you will be contacted with your lab results within the next 2 weeks.  If you have not heard from [place] then please contact us. The fastest way to get your results is to register for My Chart.   IF you received an x-ray today, you will receive an invoice from [facility]. Please contact [facility]iology at [phone] with questions or concerns regarding your invoice.   IF you received labwork today, you will receive an invoice from [place]. Please contact [facility] at [phone] with questions or concerns regarding your invoice.   Our billing staff will not be able to assist you with questions regarding bills from these companies.  You will be contacted with the lab results as soon as they are available. The fastest way to get your results is to activate your My Chart account. Instructions are located on the last page of this paperwork. If you have not heard from [place] regarding the results in 2 weeks, please contact this office.      Heart Failure Medicines  Heart failure is a condition in which the heart cannot pump enough blood through the body. This can cause symptoms such as shortness of breath, fatigue, and confusion. There are two types of heart failure:  Heart failure with reduced ejection fraction. In this type, the heart muscle is weak.  Heart failure with preserved ejection fraction. In this type, the heart muscle does not fill with blood properly and may be stiff. There is no cure for heart failure. However, being treated with medicines and following your health care provider's instructions about a healthy lifestyle can help you stay active, avoid problems, and live longer. Talk to your health care provider about all medicines that you are taking, how often you should take them, and what possible problems (side effects) they may cause. Talk with your health care provider if you have difficulty affording your medicines. What are  some common medicines for heart failure? The medicines that are prescribed for you will depend on your symptoms, the type of heart failure you have, and the cause of your heart failure. In some cases, you may need to take more than one medicine. You will be prescribed the following medicines according to your type of heart failure: Heart failure with reduced ejection fraction  Beta-blockers.  Angiotensin-converting enzyme (ACE) inhibitors.  Angiotensin II receptor blockers (ARBs).  Angiotensin receptor neprilysin inhibitors (ARNIs).  Aldosterone antagonists.  Diuretics.  Digoxin.  Nitrates. Heart failure with preserved ejection fraction  Medicines to control blood pressure, including: ? Beta-blockers. ? Angiotensin-converting enzyme (ACE) inhibitors. ? Angiotensin II receptor blockers (ARBs).  Diuretics.  Aldosterone antagonists. What should I know about beta-blockers?  These medicines lower your blood pressure and slow your heart rate. This helps to lessen your heart's workload.  They can help to relieve chest pain (angina).  They can help to improve your heart's ability to pump.  They may cause asthma attacks and shortness of breath.  Because these medicines slow your heart rate, it is important not to overwork yourself while exercising. Talk to your health care provider about what your target heart rate should be while you exercise.  These medicines can hide the symptoms of low blood sugar (glucose), which is also called hypoglycemia. If you have diabetes, make sure to check your blood glucose carefully. If you have hypoglycemia, talk to your health care provider about adjusting your medicines.  Beta-blockers may make you feel dizzy or  light-headed at first. Do not drive or use heavy machinery when you first start these medicines. Ask your health care provider when it is safe for you to drive. What should I know about ACE inhibitors or ARBs?  These medicines help to  widen arteries and veins. This action lowers your blood pressure and lessens the strain on your heart, making it easier for your heart to pump.  They can help to lessen the symptoms of heart failure.  ARBs are often used if a person cannot take ACE inhibitors.  ACE inhibitors may cause a dry cough.  In rare cases, ACE inhibitors and ARBs can cause swelling of the tongue or lips, other swelling, taste problems, and skin rashes. If these symptoms occur, stop taking the medicines and contact your health care provider.  Do not take ACE inhibitors if you are pregnant or may become pregnant. These medicines can cause health problems in an unborn baby.  These medicines may cause dizziness. You may need regular checkups and blood tests to monitor how they are working. What should I know about ARNIs?  These medicines are a combination of an ARB and another medicine. They lower your blood pressure.  Side effects may include dry cough, dizziness, low blood pressure, and kidney problems.  Do not take ARNIs if you are already taking ACE inhibitors or ARBs.  You may notice increased urination when taking these medicines.  ARNIs can raise the amount of potassium in the blood. Your potassium levels will be monitored regularly by your health care provider. What should I know about aldosterone antagonists?  They help the body to remove excess sodium through urination. This helps to lessen the amount of blood that the heart needs to pump.  They can also help to lower blood pressure and improve the heart's ability to pump blood.  They may cause dizziness, diarrhea, coughing, or flu-like symptoms.  They should not be used if you have type 2 diabetes.  They can raise the amount of potassium in the blood. Your potassium levels will be monitored regularly by your health care provider.  These medicines can make men's breasts large and tender. What should I know about diuretics?  Diuretics are medicines  that help the body get rid of excess fluid through urination. They can also help lessen your heart's workload.  They help to lessen fluid buildup in the lungs, ankles, and feet.  They help to lower your blood pressure.  They can worsen problems with controlling urination (urinary incontinence).  They may cause dizziness, headaches, muscle cramps, and an upset stomach.  They can cause weak muscles, dry mouth, or confusion. It is important to drink plenty of fluids while taking these medicines, especially while exercising or on hot days. What should I know about digoxin?  Digoxin helps the heart pump more blood efficiently. It also lowers your heart rate.  It can help ease heart failure symptoms and may be used if other medicines do not work.  It can also help with irregular heartbeat (arrhythmia).  It may cause stomach problems, fatigue, headache, drowsiness, or vision problems. What should I know about nitrates?  Nitrates relax the blood vessels and increase oxygen and blood supply to the heart. They also lower the blood pressure.  They are usually taken to lessen chest pain.  They may cause headaches, flushing, or irregular heartbeat. Summary  A healthy lifestyle and treatment with medicine will relieve symptoms of heart failure.  In some cases, you may need to take  more than one medicine.  It is important to talk to your health care provider about how often you should take your medicines. Do not skip a dose or change your dosage.  Talk to your health care provider about possible side effects of these medicines. This information is not intended to replace advice given to you by your health care provider. Make sure you discuss any questions you have with your health care provider.a Document Released: 08/14/2016 Document Revised: 04/14/2017 Document Reviewed: 08/14/2016 Elsevier Patient Education  [place].

## 2019-01-29 ENCOUNTER — Other Ambulatory Visit: Payer: Self-pay | Admitting: Family Medicine

## 2019-01-29 DIAGNOSIS — I5032 Chronic diastolic (congestive) heart failure: Secondary | ICD-10-CM

## 2019-01-29 DIAGNOSIS — Z13220 Encounter for screening for lipoid disorders: Secondary | ICD-10-CM

## 2019-02-06 ENCOUNTER — Telehealth: Payer: Self-pay | Admitting: Neurology

## 2019-02-06 NOTE — Telephone Encounter (Signed)
I called the pt and he stated, "that he tried this process with [place] before and we did not call him". I informed him that we called him on two different occasions and he did not return our calls (as documented in the pt's chart.) I informed the pt that he will have to see the sleep doctor again because his insurance requires for everything to be completed in 6 months. Pt stated to dismiss him. I informed him I would let his referring provider know.

## 2019-02-06 NOTE — Telephone Encounter (Signed)
I would be happy to see him back in sleep clinic if he changes his mind regarding the appointment.

## 2019-02-23 ENCOUNTER — Other Ambulatory Visit: Payer: Self-pay

## 2019-02-23 ENCOUNTER — Ambulatory Visit: Payer: Medicare HMO

## 2019-02-23 DIAGNOSIS — I5032 Chronic diastolic (congestive) heart failure: Secondary | ICD-10-CM

## 2019-02-23 DIAGNOSIS — T464X5A Adverse effect of angiotensin-converting-enzyme inhibitors, initial encounter: Secondary | ICD-10-CM | POA: Diagnosis not present

## 2019-02-23 DIAGNOSIS — N183 Chronic kidney disease, stage 3 unspecified: Secondary | ICD-10-CM

## 2019-02-23 DIAGNOSIS — E1122 Type 2 diabetes mellitus with diabetic chronic kidney disease: Secondary | ICD-10-CM

## 2019-02-23 DIAGNOSIS — R058 Other specified cough: Secondary | ICD-10-CM

## 2019-02-23 DIAGNOSIS — R05 Cough: Secondary | ICD-10-CM

## 2019-02-24 LAB — CMP14+EGFR
ALT: 11 IU/L (ref 0–44)
AST: 18 IU/L (ref 0–40)
Albumin/Globulin Ratio: 1.8 (ref 1.2–2.2)
Albumin: 4.4 g/dL (ref 3.7–4.7)
Alkaline Phosphatase: 53 IU/L (ref 39–117)
BUN/Creatinine Ratio: 13 (ref 10–24)
BUN: 28 mg/dL — ABNORMAL HIGH (ref 8–27)
Bilirubin Total: 0.7 mg/dL (ref 0.0–1.2)
CO2: 26 mmol/L (ref 20–29)
Calcium: 9.3 mg/dL (ref 8.6–10.2)
Chloride: 102 mmol/L (ref 96–106)
Creatinine, Ser: 2.19 mg/dL — ABNORMAL HIGH (ref 0.76–1.27)
GFR calc Af Amer: 33 mL/min/{1.73_m2} — ABNORMAL LOW (ref >59)
GFR calc non Af Amer: 29 mL/min/{1.73_m2} — ABNORMAL LOW (ref >59)
Globulin, Total: 2.5 g/dL (ref 1.5–4.5)
Glucose: 93 mg/dL (ref 65–99)
Potassium: 4.9 mmol/L (ref 3.5–5.2)
Sodium: 139 mmol/L (ref 134–144)
Total Protein: 6.9 g/dL (ref 6.0–8.5)

## 2019-03-07 ENCOUNTER — Encounter: Payer: Self-pay | Admitting: Family Medicine

## 2019-03-07 ENCOUNTER — Ambulatory Visit (INDEPENDENT_AMBULATORY_CARE_PROVIDER_SITE_OTHER): Payer: Medicare HMO | Admitting: Family Medicine

## 2019-03-07 ENCOUNTER — Other Ambulatory Visit: Payer: Self-pay

## 2019-03-07 VITALS — BP 158/78 | HR 72 | Temp 98.2°F | Ht 73.0 in | Wt 330.0 lb

## 2019-03-07 DIAGNOSIS — I110 Hypertensive heart disease with heart failure: Secondary | ICD-10-CM

## 2019-03-07 DIAGNOSIS — E1122 Type 2 diabetes mellitus with diabetic chronic kidney disease: Secondary | ICD-10-CM

## 2019-03-07 DIAGNOSIS — E1121 Type 2 diabetes mellitus with diabetic nephropathy: Secondary | ICD-10-CM

## 2019-03-07 DIAGNOSIS — N1831 Chronic kidney disease, stage 3a: Secondary | ICD-10-CM | POA: Diagnosis not present

## 2019-03-07 DIAGNOSIS — I5032 Chronic diastolic (congestive) heart failure: Secondary | ICD-10-CM | POA: Diagnosis not present

## 2019-03-07 DIAGNOSIS — Z794 Long term (current) use of insulin: Secondary | ICD-10-CM | POA: Diagnosis not present

## 2019-03-07 DIAGNOSIS — R0600 Dyspnea, unspecified: Secondary | ICD-10-CM | POA: Diagnosis not present

## 2019-03-07 LAB — CMP14+EGFR
ALT: 12 IU/L (ref 0–44)
AST: 18 IU/L (ref 0–40)
Albumin/Globulin Ratio: 1.9 (ref 1.2–2.2)
Albumin: 4.4 g/dL (ref 3.7–4.7)
Alkaline Phosphatase: 53 IU/L (ref 39–117)
BUN/Creatinine Ratio: 18 (ref 10–24)
BUN: 33 mg/dL — ABNORMAL HIGH (ref 8–27)
Bilirubin Total: 0.4 mg/dL (ref 0.0–1.2)
CO2: 25 mmol/L (ref 20–29)
Calcium: 9.1 mg/dL (ref 8.6–10.2)
Chloride: 103 mmol/L (ref 96–106)
Creatinine, Ser: 1.85 mg/dL — ABNORMAL HIGH (ref 0.76–1.27)
GFR calc Af Amer: 41 mL/min/{1.73_m2} — ABNORMAL LOW (ref >59)
GFR calc non Af Amer: 35 mL/min/{1.73_m2} — ABNORMAL LOW (ref >59)
Globulin, Total: 2.3 g/dL (ref 1.5–4.5)
Glucose: 94 mg/dL (ref 65–99)
Potassium: 4.7 mmol/L (ref 3.5–5.2)
Sodium: 141 mmol/L (ref 134–144)
Total Protein: 6.7 g/dL (ref 6.0–8.5)

## 2019-03-07 LAB — POCT CBC
Granulocyte percent: 2.6 %G — AB (ref 37–80)
HCT, POC: 34.7 % (ref 29–41)
Hemoglobin: 10.9 g/dL — AB (ref 11–14.6)
Lymph, poc: 34.7 — AB (ref 0.6–3.4)
MCH, POC: 22.2 pg — AB (ref 27–31.2)
MCHC: 31.5 g/dL — AB (ref 31.8–35.4)
MCV: 70.5 fL — AB (ref 76–111)
MID (cbc): 59.9 — AB (ref 0–0.9)
MPV: 7.3 fL (ref 0–99.8)
POC Granulocyte: 0.2 — AB (ref 2–6.9)
POC LYMPH PERCENT: 5.4 %L — AB (ref 10–50)
POC MID %: 1.5 %M (ref 0–12)
Platelet Count, POC: 276 10*3/uL (ref 142–424)
RBC: 4.92 M/uL (ref 4.69–6.13)
RDW, POC: 15.3 %
WBC: 4.4 10*3/uL — AB (ref 4.6–10.2)

## 2019-03-07 LAB — POCT GLYCOSYLATED HEMOGLOBIN (HGB A1C): Hemoglobin A1C: 6.4 % — AB (ref 4.0–5.6)

## 2019-03-07 MED ORDER — ALBUTEROL SULFATE HFA 108 (90 BASE) MCG/ACT IN AERS: 2.0000 | INHALATION_SPRAY | Freq: Four times a day (QID) | RESPIRATORY_TRACT | 0 refills | Status: DC | PRN

## 2019-03-07 NOTE — Progress Notes (Signed)
Established Patient Office Visit  Subjective:  Patient ID: Nathan Grant, male    DOB: Nov 25, 1945  Age: 73 y.o. MRN: [id number]  CC:  Chief Complaint  Patient presents with  . Medical conditions    6 week f/u   . Hypertension    HPI Nathan Grant presents for   Hypertension: Patient here for follow-up of elevated blood pressure. He is exercising and is adherent to low salt diet.  Blood pressure is not well controlled at home. Cardiac symptoms paroxysmal nocturnal dyspnea. Patient denies chest pain, chest pressure/discomfort, claudication, fatigue and irregular heart beat.  Cardiovascular risk factors: advanced age (older than 55 for men, 36 for women), diabetes mellitus, dyslipidemia, hypertension, male gender and obesity (BMI >= 30 kg/m2). Use of agents associated with hypertension: none. History of target organ damage: chronic kidney disease, left ventricular hypertrophy and peripheral artery disease.  BP Readings from Last 3 Encounters:  03/07/19 (!) 158/78  01/27/19 (!) 168/90  11/28/18 (!) 177/94    Diabetes Mellitus: Patient presents for follow up of diabetes. Symptoms: hyperglycemia. Symptoms have stabilized. Patient denies hypoglycemia , increase appetite, nausea, paresthesia of the feet, polydipsia, polyuria and visual disturbances.  Evaluation to date has been included: hemoglobin A1C.  Home sugars: BGs consistently in an acceptable range.   Lab Results  Component Value Date   HGBA1C 6.4 (A) 03/07/2019   Morbid Obesity Wt Readings from Last 3 Encounters:  03/07/19 (!) 330 lb (149.7 kg)  01/27/19 (!) 325 lb (147.4 kg)  11/28/18 (!) 327 lb 12.8 oz (148.7 kg)    Patient is more active but is not exercising He states that he has less fluid retention States that he knows he has sleep apnea  PND and night time coughing Pt reports that he is having the same sensation like he has to cough to clear his throat He feels like saliva is draining and down and makes him  have to cough He sleeps on 2 pillows He does not wheeze He refused to go back to get a sleep study at the sleep clinic.  Past Medical History:  Diagnosis Date  . Atrial flutter ([place])    s/p cardioversion 06/2010  . Bradycardia    a. Accelerated junctional & sinus bradycardia during 01/2013 adm.  . Chronic headaches   . Degenerative joint disease     End-stage degenerative joint disease, left knee  . Erectile dysfunction   . HTN (hypertension)   . Hypertensive heart disease    a. Admit for CP 01/2013 felt due to this. Cath with normal coronary anatomy.  . Microcytic anemia  03/22/2001  . Morbid obesity ([place])   . NSVT (nonsustained ventricular tachycardia) ([place])    a. During 01/2013 adm.  . Obesity   . Osteoarthritis   . Tendonitis   . Tobacco chew use     History of chewing tobacco usage.   . Urinary tract infection     Pseudomonas urinary tract infection    Past Surgical History:  Procedure Laterality Date  . CARDIOVERSION  07/01/2010  . LEFT HEART CATHETERIZATION WITH CORONARY ANGIOGRAM N/A 01/16/2013   Procedure: LEFT HEART CATHETERIZATION WITH CORONARY ANGIOGRAM;  Surgeon: Peter M Martinique, MD;  Location: [facility] CATH LAB;  Service: Cardiovascular;  Laterality: N/A;  . TOTAL KNEE ARTHROPLASTY  May 2009   Bilateral  . VASECTOMY      Family History  Problem Relation Age of Onset  . Alcohol abuse Brother   . Hypertension Brother   . Hypertension Mother  Social History   Socioeconomic History  . Marital status: Married    Spouse name: Not on file  . Number of children: 6  . Years of education: Not on file  . Highest education level: Not on file  Occupational History  . Occupation: Day Care    Employer: RETIRED  Social Needs  . Financial resource strain: Not on file  . Food insecurity    Worry: Not on file    Inability: Not on file  . Transportation needs    Medical: Not on file    Non-medical: Not on file  Tobacco Use  . Smoking status: Never Smoker  .  Smokeless tobacco: Current User    Types: Chew  Substance and Sexual Activity  . Alcohol use: No  . Drug use: No  . Sexual activity: Yes  Lifestyle  . Physical activity    Days per week: Not on file    Minutes per session: Not on file  . Stress: Not on file  Relationships  . Social Herbalist on phone: Not on file    Gets together: Not on file    Attends religious service: Not on file    Active member of club or organization: Not on file    Attends meetings of clubs or organizations: Not on file    Relationship status: Not on file  . Intimate partner violence    Fear of current or ex partner: Not on file    Emotionally abused: Not on file    Physically abused: Not on file    Forced sexual activity: Not on file  Other Topics Concern  . Not on file  Social History Narrative  . Not on file    Outpatient Medications Prior to Visit  Medication Sig Dispense Refill  . allopurinol (ZYLOPRIM) 100 MG tablet Take 1 tablet by mouth once daily 30 tablet 0  . amLODipine (NORVASC) 10 MG tablet Take 1 tablet by mouth once daily 30 tablet 0  . colchicine 0.6 MG tablet Take 0.81m three times a day on day one of gout flare then after that take 0.684mby mouth twice daily until gout flare stops. 30 tablet 3  . fluticasone (FLONASE) 50 MCG/ACT nasal spray Place 2 sprays into both nostrils daily. 16 g 6  . losartan (COZAAR) 100 MG tablet Take 1 tablet (100 mg total) by mouth daily. 30 tablet 2  . metoprolol succinate (TOPROL-XL) 25 MG 24 hr tablet Take 1 tablet (25 mg total) by mouth daily. 90 tablet 1  . Multiple Vitamins-Minerals (CENTRUM SILVER PO) Take 1 tablet by mouth daily.    . pravastatin (PRAVACHOL) 40 MG tablet TAKE 1 TABLET BY MOUTH ONCE DAILY IN THE EVENING 90 tablet 0  . spironolactone (ALDACTONE) 25 MG tablet Take 1 tablet (25 mg total) by mouth daily. 90 tablet 0  . sucralfate (CARAFATE) 1 GM/10ML suspension Take 10 mLs (1 g total) by mouth 4 (four) times daily -  with  meals and at bedtime. 420 mL 0  . aspirin EC 81 MG tablet Take 1 tablet (81 mg total) by mouth daily. 90 tablet 1   No facility-administered medications prior to visit.     Allergies  Allergen Reactions  . Aspirin   . Ibuprofen   . Tylenol [Acetaminophen]     ROS Review of Systems Review of Systems  Constitutional: Negative for activity change, appetite change, chills and fever.  HENT: Negative for congestion, nosebleeds, trouble swallowing and voice change.  Respiratory: Negative for cough, shortness of breath and wheezing.   Gastrointestinal: Negative for diarrhea, nausea and vomiting.  Genitourinary: Negative for difficulty urinating, dysuria, flank pain and hematuria.  Musculoskeletal: Negative for back pain, joint swelling and neck pain.  Neurological: Negative for dizziness, speech difficulty, light-headedness and numbness.  See HPI. All other review of systems negative.     Objective:    Physical Exam  BP (!) 158/78   Pulse 72   Temp 98.2 F (36.8 C) (Oral)   Ht _0  (1.854 m)   Wt (!) 330 lb (149.7 kg)   SpO2 97%   BMI 43.54 kg/m  Wt Readings from Last 3 Encounters:  03/07/19 (!) 330 lb (149.7 kg)  01/27/19 (!) 325 lb (147.4 kg)  11/28/18 (!) 327 lb 12.8 oz (148.7 kg)   Physical Exam  Constitutional: Oriented to person, place, and time. Appears well-developed and well-nourished.  HENT:  Head: Normocephalic and atraumatic.  Eyes: Conjunctivae and EOM are normal.  Cardiovascular: Normal rate, regular rhythm, normal heart sounds and intact distal pulses.  No murmur heard. Pulmonary/Chest: Effort normal and breath sounds normal. No stridor. No respiratory distress. Has no wheezes.  Abdomen: obese, nontender, normoactive bs, soft Neurological: Is alert and oriented to person, place, and time.  Skin: Skin is warm. Capillary refill takes less than 2 seconds.  Psychiatric: Has a normal mood and affect. Behavior is normal. Judgment and thought content normal.     Health Maintenance Due  Topic Date Due  . OPHTHALMOLOGY EXAM  06/19/1954  . PNA vac Low Risk Adult (1 of 2 - PCV13) 06/18/2009  . INFLUENZA VACCINE  11/12/2018    There are no preventive care reminders to display for this patient.  No results found for: TSH Lab Results  Component Value Date   WBC 4.4 (A) 03/07/2019   HGB 10.9 (A) 03/07/2019   HCT 34.7 03/07/2019   MCV 70.5 (A) 03/07/2019   PLT 295 10/28/2018   Lab Results  Component Value Date   NA 141 03/07/2019   K 4.7 03/07/2019   CO2 25 03/07/2019   GLUCOSE 94 03/07/2019   BUN 33 (H) 03/07/2019   CREATININE 1.85 (H) 03/07/2019   BILITOT 0.4 03/07/2019   ALKPHOS 53 03/07/2019   AST 18 03/07/2019   ALT 12 03/07/2019   PROT 6.7 03/07/2019   ALBUMIN 4.4 03/07/2019   CALCIUM 9.1 03/07/2019   ANIONGAP 11 12/03/2017   GFR 66.34 07/07/2013   Lab Results  Component Value Date   CHOL 192 10/28/2018   Lab Results  Component Value Date   HDL 56 10/28/2018   Lab Results  Component Value Date   LDLCALC 111 (H) 10/28/2018   Lab Results  Component Value Date   TRIG 127 10/28/2018   Lab Results  Component Value Date   CHOLHDL 3.4 10/28/2018   Lab Results  Component Value Date   HGBA1C 6.4 (A) 03/07/2019      Assessment & Plan:   Problem List Items Addressed This Visit      Cardiovascular and Mediastinum   Hypertensive heart disease (Chronic) - bp is steadily decreasing Discussed bp and commended him for his compliance.  These are some of the best readings so far.   Relevant Orders   CMP14+EGFR (Completed)   POCT CBC (Completed)     Endocrine   Type 2 diabetes mellitus with stage 3 chronic kidney disease, with long-term current use of insulin ([place]) - Primary   Relevant Orders   POCT  glycosylated hemoglobin (Hb A1C) (Completed)   CMP14+EGFR (Completed)   POCT CBC (Completed)     Other   Morbid obesity (HCC) (Chronic)  - worsening, discussed weight loss and how it can help decrease GERD  Also  discussed his obesity that is worsening can worsen his apnea and apnea can lead to worsening morbidity and mortality in all areas   Relevant Orders   POCT CBC (Completed)   PND (paroxysmal nocturnal dyspnea)-  Discussed using albuterol at bedtime Also advised reflux meds   Relevant Orders   POCT CBC (Completed)      Meds ordered this encounter  Medications  . albuterol (VENTOLIN HFA) 108 (90 Base) MCG/ACT inhaler    Sig: Inhale 2 puffs into the lungs every 6 (six) hours as needed for wheezing.    Dispense:  18 g    Refill:  0    Follow-up: No follow-ups on file.    Forrest Moron, MD

## 2019-03-07 NOTE — Patient Instructions (Signed)
° ° ° °  If you have lab work done today you will be contacted with your lab results within the next 2 weeks.  If you have not heard from us then please contact us. The fastest way to get your results is to register for My Chart. ° ° °IF you received an x-ray today, you will receive an invoice from [facility]. Please contact  Radiology at [phone] with questions or concerns regarding your invoice.  ° °IF you received labwork today, you will receive an invoice from [facility]. Please contact [facility] at [phone] with questions or concerns regarding your invoice.  ° °Our billing staff will not be able to assist you with questions regarding bills from these companies. ° °You will be contacted with the lab results as soon as they are available. The fastest way to get your results is to activate your My Chart account. Instructions are located on the last page of this paperwork. If you have not heard from us regarding the results in 2 weeks, please contact this office. °  ° ° ° °

## 2019-03-08 ENCOUNTER — Ambulatory Visit: Payer: Medicare HMO | Admitting: Family Medicine

## 2019-03-13 DIAGNOSIS — Z6841 Body Mass Index (BMI) 40.0 and over, adult: Secondary | ICD-10-CM | POA: Diagnosis not present

## 2019-03-13 DIAGNOSIS — K219 Gastro-esophageal reflux disease without esophagitis: Secondary | ICD-10-CM | POA: Diagnosis not present

## 2019-03-13 DIAGNOSIS — G8929 Other chronic pain: Secondary | ICD-10-CM | POA: Diagnosis not present

## 2019-03-13 DIAGNOSIS — I4892 Unspecified atrial flutter: Secondary | ICD-10-CM | POA: Diagnosis not present

## 2019-03-13 DIAGNOSIS — I509 Heart failure, unspecified: Secondary | ICD-10-CM | POA: Diagnosis not present

## 2019-03-13 DIAGNOSIS — J309 Allergic rhinitis, unspecified: Secondary | ICD-10-CM | POA: Diagnosis not present

## 2019-03-13 DIAGNOSIS — E785 Hyperlipidemia, unspecified: Secondary | ICD-10-CM | POA: Diagnosis not present

## 2019-03-13 DIAGNOSIS — Z008 Encounter for other general examination: Secondary | ICD-10-CM | POA: Diagnosis not present

## 2019-03-13 DIAGNOSIS — E1122 Type 2 diabetes mellitus with diabetic chronic kidney disease: Secondary | ICD-10-CM | POA: Diagnosis not present

## 2019-03-13 DIAGNOSIS — I130 Hypertensive heart and chronic kidney disease with heart failure and stage 1 through stage 4 chronic kidney disease, or unspecified chronic kidney disease: Secondary | ICD-10-CM | POA: Diagnosis not present

## 2019-03-28 ENCOUNTER — Other Ambulatory Visit: Payer: Self-pay | Admitting: Family Medicine

## 2019-03-28 MED ORDER — LOSARTAN POTASSIUM 100 MG PO TABS: 100.0000 mg | ORAL_TABLET | Freq: Every day | ORAL | 1 refills | Status: DC

## 2019-03-28 NOTE — Telephone Encounter (Signed)
Medication Refill - Medication: losartan (COZAAR) 100 MG tablet [[id number]]     Preferred Pharmacy (with phone number or street name):  [facility] [place] [address]  [place] [address]  Phone: [phone] Fax: [phone]     Agent: Please be advised that RX refills may take up to 3 business days. We ask that you follow-up with your pharmacy.

## 2019-04-12 ENCOUNTER — Ambulatory Visit: Payer: Medicare HMO | Attending: Internal Medicine

## 2019-04-12 DIAGNOSIS — Z20822 Contact with and (suspected) exposure to covid-19: Secondary | ICD-10-CM

## 2019-04-12 DIAGNOSIS — Z20828 Contact with and (suspected) exposure to other viral communicable diseases: Secondary | ICD-10-CM | POA: Diagnosis not present

## 2019-04-13 LAB — NOVEL CORONAVIRUS, NAA: SARS-CoV-2, NAA: DETECTED — AB

## 2019-04-17 ENCOUNTER — Other Ambulatory Visit: Payer: Self-pay

## 2019-04-17 ENCOUNTER — Telehealth (INDEPENDENT_AMBULATORY_CARE_PROVIDER_SITE_OTHER): Payer: Medicare HMO | Admitting: Family Medicine

## 2019-04-17 DIAGNOSIS — U071 COVID-19: Secondary | ICD-10-CM

## 2019-04-17 MED ORDER — ALBUTEROL SULFATE HFA 108 (90 BASE) MCG/ACT IN AERS: 2.0000 | INHALATION_SPRAY | Freq: Four times a day (QID) | RESPIRATORY_TRACT | 0 refills | Status: AC | PRN

## 2019-04-17 NOTE — Progress Notes (Signed)
Virtual Visit Note  I connected with patient on 04/17/19 at 546pm by phpne and verified that I am speaking with the correct person using two identifiers. Nathan Grant is currently located at home and patient is currently with them during visit. The provider, Rutherford Guys, MD is located in their office at time of visit.  I discussed the limitations, risks, security and privacy concerns of performing an evaluation and management service by telephone and the availability of in person appointments. I also discussed with the patient that there may be a patient responsible charge related to this service. The patient expressed understanding and agreed to proceed.   CC: covid positive  HPI ? Patient tested positive for covid 5 days ago He reports that he is doing ok He reports fever have resolved He is having chills and cough He still fatigue Cough is getting better - OTC meds help Has some intermittent wheezing - does not have albuterol at home "my breathing is good" No body ache or headache Sleeping well Not eating much but drinking plenty of fluids No CP, palpitations, edema is better compared to baseline He has been in quarantine at home    Allergies  Allergen Reactions  . Aspirin   . Ibuprofen   . Tylenol [Acetaminophen]     Prior to Admission medications   Medication Sig Start Date End Date Taking? Authorizing Provider  albuterol (VENTOLIN HFA) 108 (90 Base) MCG/ACT inhaler Inhale 2 puffs into the lungs every 6 (six) hours as needed for wheezing. 03/07/19  Yes Forrest Moron, MD  allopurinol (ZYLOPRIM) 100 MG tablet Take 1 tablet by mouth once daily 01/23/19  Yes Stallings, Zoe A, MD  amLODipine (NORVASC) 10 MG tablet Take 1 tablet by mouth once daily 01/30/19  Yes Stallings, Zoe A, MD  colchicine 0.6 MG tablet Take 0.6mg  three times a day on day one of gout flare then after that take 0.6mg  by mouth twice daily until gout flare stops. 12/15/18  Yes Stallings, Zoe A, MD   fluticasone (FLONASE) 50 MCG/ACT nasal spray Place 2 sprays into both nostrils daily. 03/08/18  Yes Forrest Moron, MD  losartan (COZAAR) 100 MG tablet Take 1 tablet (100 mg total) by mouth daily. 03/28/19  Yes Delia Chimes A, MD  metoprolol succinate (TOPROL-XL) 25 MG 24 hr tablet Take 1 tablet (25 mg total) by mouth daily. 12/15/18  Yes Forrest Moron, MD  Multiple Vitamins-Minerals (CENTRUM SILVER PO) Take 1 tablet by mouth daily.   Yes [provider]  pravastatin (PRAVACHOL) 40 MG tablet TAKE 1 TABLET BY MOUTH ONCE DAILY IN THE EVENING 01/30/19  Yes Stallings, Zoe A, MD  spironolactone (ALDACTONE) 25 MG tablet Take 1 tablet (25 mg total) by mouth daily. 12/15/18  Yes Stallings, Zoe A, MD  sucralfate (CARAFATE) 1 GM/10ML suspension Take 10 mLs (1 g total) by mouth 4 (four) times daily -  with meals and at bedtime. 12/04/17  Yes Varney Biles, MD    Past Medical History:  Diagnosis Date  . Atrial flutter ([place])    s/p cardioversion 06/2010  . Bradycardia    a. Accelerated junctional & sinus bradycardia during 01/2013 adm.  . Chronic headaches   . Degenerative joint disease     End-stage degenerative joint disease, left knee  . Erectile dysfunction   . HTN (hypertension)   . Hypertensive heart disease    a. Admit for CP 01/2013 felt due to this. Cath with normal coronary anatomy.  . Microcytic anemia  03/22/2001  . Morbid obesity ([place])   . NSVT (nonsustained ventricular tachycardia) ([place])    a. During 01/2013 adm.  . Obesity   . Osteoarthritis   . Tendonitis   . Tobacco chew use     History of chewing tobacco usage.   . Urinary tract infection     Pseudomonas urinary tract infection    Past Surgical History:  Procedure Laterality Date  . CARDIOVERSION  07/01/2010  . LEFT HEART CATHETERIZATION WITH CORONARY ANGIOGRAM N/A 01/16/2013   Procedure: LEFT HEART CATHETERIZATION WITH CORONARY ANGIOGRAM;  Surgeon: Peter M Martinique, MD;  Location: [facility] CATH LAB;  Service:  Cardiovascular;  Laterality: N/A;  . TOTAL KNEE ARTHROPLASTY  May 2009   Bilateral  . VASECTOMY      Social History   Tobacco Use  . Smoking status: Never Smoker  . Smokeless tobacco: Current User    Types: Chew  Substance Use Topics  . Alcohol use: No    Family History  Problem Relation Age of Onset  . Alcohol abuse Brother   . Hypertension Brother   . Hypertension Mother     ROS Per hpi  Objective  Vitals as reported by the patient: none  Gen: AAOx3, NAD Speaking in full sentences, breathing comfortably, minimal cough during visit   ASSESSMENT and PLAN  1. COVID-19 virus infection Currently with mild symptoms. However patient with high risk of complications given age and cardiac history.  Albuterol prn for wheezing. RTC precautions reviewed. Reviewed quarantine guidelines.  Other orders - albuterol (VENTOLIN HFA) 108 (90 Base) MCG/ACT inhaler; Inhale 2 puffs into the lungs every 6 (six) hours as needed for wheezing.  FOLLOW-UP: 3-4 days   The above assessment and management plan was discussed with the patient. The patient verbalized understanding of and has agreed to the management plan. Patient is aware to call the clinic if symptoms persist or worsen. Patient is aware when to return to the clinic for a follow-up visit. Patient educated on when it is appropriate to go to the emergency department.    I provided 12 minutes of non-face-to-face time during this encounter.  Rutherford Guys, MD [facility] at [facility] [place], [place] [address] Ph.  [phone] Fax [phone]

## 2019-04-19 ENCOUNTER — Other Ambulatory Visit: Payer: Self-pay

## 2019-04-19 ENCOUNTER — Encounter (HOSPITAL_COMMUNITY): Payer: Self-pay

## 2019-04-19 ENCOUNTER — Emergency Department (HOSPITAL_COMMUNITY): Payer: Medicare HMO

## 2019-04-19 ENCOUNTER — Inpatient Hospital Stay (HOSPITAL_COMMUNITY)
Admission: EM | Admit: 2019-04-19 | Discharge: 2019-04-28 | DRG: 177 | Disposition: A | Discharge status: Home / Self Care | Payer: Medicare HMO | Attending: Internal Medicine | Admitting: Internal Medicine

## 2019-04-19 DIAGNOSIS — N171 Acute kidney failure with acute cortical necrosis: Secondary | ICD-10-CM | POA: Diagnosis not present

## 2019-04-19 DIAGNOSIS — I5032 Chronic diastolic (congestive) heart failure: Secondary | ICD-10-CM | POA: Diagnosis present

## 2019-04-19 DIAGNOSIS — Z79899 Other long term (current) drug therapy: Secondary | ICD-10-CM

## 2019-04-19 DIAGNOSIS — N529 Male erectile dysfunction, unspecified: Secondary | ICD-10-CM | POA: Diagnosis present

## 2019-04-19 DIAGNOSIS — I480 Paroxysmal atrial fibrillation: Secondary | ICD-10-CM | POA: Diagnosis present

## 2019-04-19 DIAGNOSIS — R0603 Acute respiratory distress: Secondary | ICD-10-CM

## 2019-04-19 DIAGNOSIS — I82403 Acute embolism and thrombosis of unspecified deep veins of lower extremity, bilateral: Secondary | ICD-10-CM | POA: Diagnosis present

## 2019-04-19 DIAGNOSIS — I2699 Other pulmonary embolism without acute cor pulmonale: Secondary | ICD-10-CM | POA: Diagnosis present

## 2019-04-19 DIAGNOSIS — Z96653 Presence of artificial knee joint, bilateral: Secondary | ICD-10-CM | POA: Diagnosis present

## 2019-04-19 DIAGNOSIS — R404 Transient alteration of awareness: Secondary | ICD-10-CM | POA: Diagnosis not present

## 2019-04-19 DIAGNOSIS — J1282 Pneumonia due to coronavirus disease 2019: Secondary | ICD-10-CM | POA: Diagnosis not present

## 2019-04-19 DIAGNOSIS — I4892 Unspecified atrial flutter: Secondary | ICD-10-CM | POA: Diagnosis not present

## 2019-04-19 DIAGNOSIS — I483 Typical atrial flutter: Secondary | ICD-10-CM | POA: Diagnosis not present

## 2019-04-19 DIAGNOSIS — J9601 Acute respiratory failure with hypoxia: Secondary | ICD-10-CM | POA: Diagnosis present

## 2019-04-19 DIAGNOSIS — I129 Hypertensive chronic kidney disease with stage 1 through stage 4 chronic kidney disease, or unspecified chronic kidney disease: Secondary | ICD-10-CM | POA: Diagnosis not present

## 2019-04-19 DIAGNOSIS — I272 Pulmonary hypertension, unspecified: Secondary | ICD-10-CM | POA: Diagnosis present

## 2019-04-19 DIAGNOSIS — Z8249 Family history of ischemic heart disease and other diseases of the circulatory system: Secondary | ICD-10-CM | POA: Diagnosis not present

## 2019-04-19 DIAGNOSIS — R410 Disorientation, unspecified: Secondary | ICD-10-CM | POA: Diagnosis not present

## 2019-04-19 DIAGNOSIS — J9600 Acute respiratory failure, unspecified whether with hypoxia or hypercapnia: Secondary | ICD-10-CM | POA: Diagnosis present

## 2019-04-19 DIAGNOSIS — F17290 Nicotine dependence, other tobacco product, uncomplicated: Secondary | ICD-10-CM | POA: Diagnosis present

## 2019-04-19 DIAGNOSIS — Z7982 Long term (current) use of aspirin: Secondary | ICD-10-CM | POA: Diagnosis not present

## 2019-04-19 DIAGNOSIS — I371 Nonrheumatic pulmonary valve insufficiency: Secondary | ICD-10-CM | POA: Diagnosis not present

## 2019-04-19 DIAGNOSIS — R7989 Other specified abnormal findings of blood chemistry: Secondary | ICD-10-CM

## 2019-04-19 DIAGNOSIS — E875 Hyperkalemia: Secondary | ICD-10-CM | POA: Diagnosis not present

## 2019-04-19 DIAGNOSIS — I10 Essential (primary) hypertension: Secondary | ICD-10-CM | POA: Diagnosis present

## 2019-04-19 DIAGNOSIS — N183 Chronic kidney disease, stage 3 unspecified: Secondary | ICD-10-CM | POA: Diagnosis not present

## 2019-04-19 DIAGNOSIS — I5033 Acute on chronic diastolic (congestive) heart failure: Secondary | ICD-10-CM | POA: Diagnosis not present

## 2019-04-19 DIAGNOSIS — J159 Unspecified bacterial pneumonia: Secondary | ICD-10-CM | POA: Diagnosis present

## 2019-04-19 DIAGNOSIS — Z811 Family history of alcohol abuse and dependence: Secondary | ICD-10-CM

## 2019-04-19 DIAGNOSIS — U071 COVID-19: Principal | ICD-10-CM | POA: Diagnosis present

## 2019-04-19 DIAGNOSIS — N179 Acute kidney failure, unspecified: Secondary | ICD-10-CM

## 2019-04-19 DIAGNOSIS — N189 Chronic kidney disease, unspecified: Secondary | ICD-10-CM | POA: Diagnosis present

## 2019-04-19 DIAGNOSIS — N1832 Chronic kidney disease, stage 3b: Secondary | ICD-10-CM | POA: Diagnosis present

## 2019-04-19 DIAGNOSIS — R778 Other specified abnormalities of plasma proteins: Secondary | ICD-10-CM

## 2019-04-19 DIAGNOSIS — R918 Other nonspecific abnormal finding of lung field: Secondary | ICD-10-CM | POA: Diagnosis not present

## 2019-04-19 DIAGNOSIS — Z6841 Body Mass Index (BMI) 40.0 and over, adult: Secondary | ICD-10-CM

## 2019-04-19 DIAGNOSIS — E1122 Type 2 diabetes mellitus with diabetic chronic kidney disease: Secondary | ICD-10-CM | POA: Diagnosis present

## 2019-04-19 DIAGNOSIS — N170 Acute kidney failure with tubular necrosis: Secondary | ICD-10-CM | POA: Diagnosis not present

## 2019-04-19 DIAGNOSIS — I130 Hypertensive heart and chronic kidney disease with heart failure and stage 1 through stage 4 chronic kidney disease, or unspecified chronic kidney disease: Secondary | ICD-10-CM | POA: Diagnosis present

## 2019-04-19 DIAGNOSIS — I361 Nonrheumatic tricuspid (valve) insufficiency: Secondary | ICD-10-CM | POA: Diagnosis not present

## 2019-04-19 DIAGNOSIS — R0602 Shortness of breath: Secondary | ICD-10-CM | POA: Diagnosis not present

## 2019-04-19 DIAGNOSIS — J1289 Other viral pneumonia: Secondary | ICD-10-CM | POA: Diagnosis not present

## 2019-04-19 DIAGNOSIS — R064 Hyperventilation: Secondary | ICD-10-CM | POA: Diagnosis not present

## 2019-04-19 DIAGNOSIS — R519 Headache, unspecified: Secondary | ICD-10-CM | POA: Diagnosis present

## 2019-04-19 DIAGNOSIS — R0902 Hypoxemia: Secondary | ICD-10-CM

## 2019-04-19 LAB — CBC WITH DIFFERENTIAL/PLATELET
Abs Immature Granulocytes: 0.57 10*3/uL — ABNORMAL HIGH (ref 0.00–0.07)
Basophils Absolute: 0 10*3/uL (ref 0.0–0.1)
Basophils Relative: 0 %
Eosinophils Absolute: 0 10*3/uL (ref 0.0–0.5)
Eosinophils Relative: 0 %
HCT: 30 % — ABNORMAL LOW (ref 39.0–52.0)
Hemoglobin: 9.6 g/dL — ABNORMAL LOW (ref 13.0–17.0)
Immature Granulocytes: 5 %
Lymphocytes Relative: 8 %
Lymphs Abs: 1 10*3/uL (ref 0.7–4.0)
MCH: 22.5 pg — ABNORMAL LOW (ref 26.0–34.0)
MCHC: 32 g/dL (ref 30.0–36.0)
MCV: 70.4 fL — ABNORMAL LOW (ref 80.0–100.0)
Monocytes Absolute: 0.6 10*3/uL (ref 0.1–1.0)
Monocytes Relative: 5 %
Neutro Abs: 10 10*3/uL — ABNORMAL HIGH (ref 1.7–7.7)
Neutrophils Relative %: 82 %
Platelets: 176 10*3/uL (ref 150–400)
RBC: 4.26 MIL/uL (ref 4.22–5.81)
RDW: 16.4 % — ABNORMAL HIGH (ref 11.5–15.5)
WBC: 12.2 10*3/uL — ABNORMAL HIGH (ref 4.0–10.5)
nRBC: 1.1 % — ABNORMAL HIGH (ref 0.0–0.2)

## 2019-04-19 LAB — COMPREHENSIVE METABOLIC PANEL
ALT: 33 U/L (ref 0–44)
AST: 56 U/L — ABNORMAL HIGH (ref 15–41)
Albumin: 2.7 g/dL — ABNORMAL LOW (ref 3.5–5.0)
Alkaline Phosphatase: 58 U/L (ref 38–126)
Anion gap: 14 (ref 5–15)
BUN: 77 mg/dL — ABNORMAL HIGH (ref 8–23)
CO2: 19 mmol/L — ABNORMAL LOW (ref 22–32)
Calcium: 7.9 mg/dL — ABNORMAL LOW (ref 8.9–10.3)
Chloride: 101 mmol/L (ref 98–111)
Creatinine, Ser: 4.19 mg/dL — ABNORMAL HIGH (ref 0.61–1.24)
GFR calc Af Amer: 15 mL/min — ABNORMAL LOW (ref >60)
GFR calc non Af Amer: 13 mL/min — ABNORMAL LOW (ref >60)
Glucose, Bld: 176 mg/dL — ABNORMAL HIGH (ref 70–99)
Potassium: 5.2 mmol/L — ABNORMAL HIGH (ref 3.5–5.1)
Sodium: 134 mmol/L — ABNORMAL LOW (ref 135–145)
Total Bilirubin: 1.4 mg/dL — ABNORMAL HIGH (ref 0.3–1.2)
Total Protein: 7 g/dL (ref 6.5–8.1)

## 2019-04-19 LAB — D-DIMER, QUANTITATIVE: D-Dimer, Quant: >20 ug/mL-FEU — ABNORMAL HIGH (ref 0.00–0.50)

## 2019-04-19 LAB — LACTIC ACID, PLASMA
Lactic Acid, Venous: 3.2 mmol/L (ref 0.5–1.9)
Lactic Acid, Venous: 2.1 mmol/L (ref 0.5–1.9)

## 2019-04-19 LAB — PROTIME-INR
INR: 1.6 — ABNORMAL HIGH (ref 0.8–1.2)
Prothrombin Time: 19.4 seconds — ABNORMAL HIGH (ref 11.4–15.2)

## 2019-04-19 LAB — APTT: aPTT: 38 seconds — ABNORMAL HIGH (ref 24–36)

## 2019-04-19 LAB — BRAIN NATRIURETIC PEPTIDE: B Natriuretic Peptide: 413 pg/mL — ABNORMAL HIGH (ref 0.0–100.0)

## 2019-04-19 LAB — C-REACTIVE PROTEIN: CRP: 19.7 mg/dL — ABNORMAL HIGH (ref <1.0)

## 2019-04-19 LAB — TROPONIN I (HIGH SENSITIVITY)
Troponin I (High Sensitivity): 164 ng/L (ref <18)
Troponin I (High Sensitivity): 205 ng/L (ref <18)

## 2019-04-19 LAB — PROCALCITONIN: Procalcitonin: 0.96 ng/mL

## 2019-04-19 LAB — TYPE AND SCREEN
ABO/RH(D): A POS
Antibody Screen: NEGATIVE

## 2019-04-19 LAB — LACTATE DEHYDROGENASE: LDH: 761 U/L — ABNORMAL HIGH (ref 98–192)

## 2019-04-19 MED ORDER — SODIUM CHLORIDE 0.9 % IV SOLN: INTRAVENOUS | Status: AC

## 2019-04-19 MED ORDER — DEXAMETHASONE SODIUM PHOSPHATE 10 MG/ML IJ SOLN
6.0000 mg | Freq: Once | INTRAMUSCULAR | Status: AC
  Administered 2019-04-19: 6 mg via INTRAVENOUS
  Filled 2019-04-19: qty 1

## 2019-04-19 MED ORDER — ASCORBIC ACID 500 MG PO TABS
500.0000 mg | ORAL_TABLET | Freq: Every day | ORAL | Status: DC
  Administered 2019-04-20 – 2019-04-28 (×10): 500 mg via ORAL
  Filled 2019-04-19 (×10): qty 1

## 2019-04-19 MED ORDER — ZINC SULFATE 220 (50 ZN) MG PO CAPS
220.0000 mg | ORAL_CAPSULE | Freq: Every day | ORAL | Status: DC
  Administered 2019-04-20 – 2019-04-28 (×10): 220 mg via ORAL
  Filled 2019-04-19 (×10): qty 1

## 2019-04-19 MED ORDER — ONDANSETRON HCL 4 MG PO TABS: 4.0000 mg | ORAL_TABLET | Freq: Four times a day (QID) | ORAL | Status: DC | PRN

## 2019-04-19 MED ORDER — DEXAMETHASONE SODIUM PHOSPHATE 10 MG/ML IJ SOLN: 6.0000 mg | INTRAMUSCULAR | Status: DC

## 2019-04-19 MED ORDER — HEPARIN (PORCINE) 25000 UT/250ML-% IV SOLN
2250.0000 [IU]/h | INTRAVENOUS | Status: DC
  Administered 2019-04-19: 1900 [IU]/h via INTRAVENOUS
  Administered 2019-04-20: 06:00:00 2250 [IU]/h via INTRAVENOUS
  Filled 2019-04-19 (×2): qty 250

## 2019-04-19 MED ORDER — SODIUM CHLORIDE 0.9 % IV SOLN
200.0000 mg | Freq: Once | INTRAVENOUS | Status: AC
  Administered 2019-04-20: 03:00:00 200 mg via INTRAVENOUS
  Filled 2019-04-19 (×2): qty 40

## 2019-04-19 MED ORDER — SODIUM CHLORIDE 0.9 % IV SOLN
100.0000 mg | Freq: Every day | INTRAVENOUS | Status: DC
  Filled 2019-04-19: qty 20

## 2019-04-19 MED ORDER — HEPARIN BOLUS VIA INFUSION
5000.0000 [IU] | Freq: Once | INTRAVENOUS | Status: AC
  Administered 2019-04-19: 5000 [IU] via INTRAVENOUS
  Filled 2019-04-19: qty 5000

## 2019-04-19 MED ORDER — ONDANSETRON HCL 4 MG/2ML IJ SOLN: 4.0000 mg | Freq: Four times a day (QID) | INTRAMUSCULAR | Status: DC | PRN

## 2019-04-19 NOTE — ED Provider Notes (Signed)
[place] DEPT Provider Note   CSN: [id number] Arrival date & time: 04/19/19  1724     History Chief Complaint  Patient presents with  . COVID +  . Respiratory Distress    Nathan Grant is a 74 y.o. male.  The history is provided by the patient and medical records. No language interpreter was used.  Shortness of Breath Severity:  Severe Onset quality:  Gradual Duration:  3 days Timing:  Constant Progression:  Worsening Chronicity:  New Context: URI (covid)   Relieved by:  Nothing Worsened by:  Nothing Ineffective treatments:  None tried Associated symptoms: cough   Associated symptoms: no abdominal pain, no chest pain, no claudication, no diaphoresis, no fever, no headaches, no neck pain, no sputum production, no vomiting and no wheezing        Past Medical History:  Diagnosis Date  . Atrial flutter ([place])    s/p cardioversion 06/2010  . Bradycardia    a. Accelerated junctional & sinus bradycardia during 01/2013 adm.  . Chronic headaches   . Degenerative joint disease     End-stage degenerative joint disease, left knee  . Erectile dysfunction   . HTN (hypertension)   . Hypertensive heart disease    a. Admit for CP 01/2013 felt due to this. Cath with normal coronary anatomy.  . Microcytic anemia  03/22/2001  . Morbid obesity ([place])   . NSVT (nonsustained ventricular tachycardia) ([place])    a. During 01/2013 adm.  . Obesity   . Osteoarthritis   . Tendonitis   . Tobacco chew use     History of chewing tobacco usage.   . Urinary tract infection     Pseudomonas urinary tract infection    Patient Active Problem List   Diagnosis Date Noted  . Wheezing 01/05/2019  . Post-nasal drip 01/05/2019  . Type 2 diabetes mellitus with stage 3 chronic kidney disease, with long-term current use of insulin ([place]) 04/28/2018  . Uricacidemia 04/28/2018  . Foot callus 04/28/2018  . Onychodystrophy 04/28/2018  . PND (paroxysmal nocturnal  dyspnea) 03/08/2018  . Enuresis 03/08/2018  . Chronic gout due to renal impairment of multiple sites without tophus 03/08/2018  . OSA (obstructive sleep apnea) 03/08/2018  . CKD stage 3 due to type 2 diabetes mellitus ([place]) 03/08/2018  . Uncontrolled hypertension 03/08/2018  . Chronic diastolic CHF (congestive heart failure) ([place]) 09/06/2014  . CKD (chronic kidney disease) stage 3, GFR 30-59 ml/min 09/06/2014  . Hypercholesterolemia 12/21/2013  . HTN (hypertension) 01/17/2013  . NSVT (nonsustained ventricular tachycardia) ([place]) 01/17/2013  . Accelerated junctional rhythm 01/17/2013  . Sinus bradycardia 01/17/2013  . Diastolic dysfunction 22/29/7989  . Acute on chronic renal failure ([place]) 05/30/2012  . Osteoarthritis   . Hypokalemia   . Erectile dysfunction   . Chronic headaches   . Hyperlipidemia   . Morbid obesity ([place])   . Atrial flutter ([place])   . Hypertensive heart disease   . Microcytic anemia 03/22/2001    Past Surgical History:  Procedure Laterality Date  . CARDIOVERSION  07/01/2010  . LEFT HEART CATHETERIZATION WITH CORONARY ANGIOGRAM N/A 01/16/2013   Procedure: LEFT HEART CATHETERIZATION WITH CORONARY ANGIOGRAM;  Surgeon: Peter M Martinique, MD;  Location: [facility] CATH LAB;  Service: Cardiovascular;  Laterality: N/A;  . TOTAL KNEE ARTHROPLASTY  May 2009   Bilateral  . VASECTOMY         Family History  Problem Relation Age of Onset  . Alcohol abuse Brother   . Hypertension Brother   .  Hypertension Mother     Social History   Tobacco Use  . Smoking status: Never Smoker  . Smokeless tobacco: Current User    Types: Chew  Substance Use Topics  . Alcohol use: No  . Drug use: No    Home Medications Prior to Admission medications   Medication Sig Start Date End Date Taking? Authorizing Provider  albuterol (VENTOLIN HFA) 108 (90 Base) MCG/ACT inhaler Inhale 2 puffs into the lungs every 6 (six) hours as needed for wheezing. 04/17/19   Rutherford Guys, MD  allopurinol  (ZYLOPRIM) 100 MG tablet Take 1 tablet by mouth once daily 01/23/19   Delia Chimes A, MD  amLODipine (NORVASC) 10 MG tablet Take 1 tablet by mouth once daily 01/30/19   Forrest Moron, MD  colchicine 0.6 MG tablet Take 0.6mg  three times a day on day one of gout flare then after that take 0.6mg  by mouth twice daily until gout flare stops. 12/15/18   Forrest Moron, MD  fluticasone (FLONASE) 50 MCG/ACT nasal spray Place 2 sprays into both nostrils daily. 03/08/18   Forrest Moron, MD  losartan (COZAAR) 100 MG tablet Take 1 tablet (100 mg total) by mouth daily. 03/28/19   Forrest Moron, MD  metoprolol succinate (TOPROL-XL) 25 MG 24 hr tablet Take 1 tablet (25 mg total) by mouth daily. 12/15/18   Forrest Moron, MD  Multiple Vitamins-Minerals (CENTRUM SILVER PO) Take 1 tablet by mouth daily.    [provider]  pravastatin (PRAVACHOL) 40 MG tablet TAKE 1 TABLET BY MOUTH ONCE DAILY IN THE EVENING 01/30/19   Delia Chimes A, MD  spironolactone (ALDACTONE) 25 MG tablet Take 1 tablet (25 mg total) by mouth daily. 12/15/18   Forrest Moron, MD  sucralfate (CARAFATE) 1 GM/10ML suspension Take 10 mLs (1 g total) by mouth 4 (four) times daily -  with meals and at bedtime. 12/04/17   Varney Biles, MD    Allergies    Aspirin, Ibuprofen, and Tylenol [acetaminophen]  Review of Systems   Review of Systems  Unable to perform ROS: Severe respiratory distress  Constitutional: Positive for chills and fatigue. Negative for diaphoresis and fever.  HENT: Negative for congestion.   Respiratory: Positive for cough, chest tightness and shortness of breath. Negative for sputum production, wheezing and stridor.   Cardiovascular: Positive for leg swelling (mild and chronic). Negative for chest pain, palpitations and claudication.  Gastrointestinal: Negative for abdominal pain, constipation, diarrhea, nausea and vomiting.  Genitourinary: Negative for flank pain.  Musculoskeletal: Negative for back pain  and neck pain.  Neurological: Negative for headaches.  Psychiatric/Behavioral: Negative for agitation.  All other systems reviewed and are negative.   Physical Exam Updated Vital Signs BP (!) 154/86   Pulse (!) 115   Temp 97.8 F (36.6 C)   Resp (!) 34   SpO2 (!) 78%   Physical Exam Vitals and nursing note reviewed.  Constitutional:      General: He is in acute distress.     Appearance: He is well-developed. He is ill-appearing. He is not toxic-appearing or diaphoretic.  HENT:     Head: Normocephalic and atraumatic.     Nose: No congestion or rhinorrhea.  Eyes:     Conjunctiva/sclera: Conjunctivae normal.     Pupils: Pupils are equal, round, and reactive to light.  Cardiovascular:     Rate and Rhythm: Tachycardia present. Rhythm irregular.     Pulses: Normal pulses.     Heart sounds: No murmur.  Pulmonary:     Effort: Respiratory distress present.     Breath sounds: Rhonchi present. No wheezing or rales.  Chest:     Chest wall: No tenderness.  Abdominal:     General: Abdomen is flat.     Palpations: Abdomen is soft.     Tenderness: There is no abdominal tenderness. There is no right CVA tenderness or left CVA tenderness.  Musculoskeletal:        General: No tenderness or signs of injury.     Cervical back: Neck supple. No tenderness.     Right lower leg: Edema present.     Left lower leg: Edema present.     Comments: Mild edema  Skin:    General: Skin is warm and dry.     Capillary Refill: Capillary refill takes less than 2 seconds.  Neurological:     General: No focal deficit present.     Mental Status: He is alert.  Psychiatric:        Mood and Affect: Mood normal.     ED Results / Procedures / Treatments   Labs (all labs ordered are listed, but only abnormal results are displayed) Labs Reviewed  CBC WITH DIFFERENTIAL/PLATELET - Abnormal; Notable for the following components:      Result Value   WBC 12.2 (*)    Hemoglobin 9.6 (*)    HCT 30.0 (*)     MCV 70.4 (*)    MCH 22.5 (*)    RDW 16.4 (*)    nRBC 1.1 (*)    Neutro Abs 10.0 (*)    Abs Immature Granulocytes 0.57 (*)    All other components within normal limits  COMPREHENSIVE METABOLIC PANEL - Abnormal; Notable for the following components:   Sodium 134 (*)    Potassium 5.2 (*)    CO2 19 (*)    Glucose, Bld 176 (*)    BUN 77 (*)    Creatinine, Ser 4.19 (*)    Calcium 7.9 (*)    Albumin 2.7 (*)    AST 56 (*)    Total Bilirubin 1.4 (*)    GFR calc non Af Amer 13 (*)    GFR calc Af Amer 15 (*)    All other components within normal limits  LACTIC ACID, PLASMA - Abnormal; Notable for the following components:   Lactic Acid, Venous 3.2 (*)    All other components within normal limits  LACTIC ACID, PLASMA - Abnormal; Notable for the following components:   Lactic Acid, Venous 2.1 (*)    All other components within normal limits  BRAIN NATRIURETIC PEPTIDE - Abnormal; Notable for the following components:   B Natriuretic Peptide 413.0 (*)    All other components within normal limits  D-DIMER, QUANTITATIVE (NOT AT [facility]) - Abnormal; Notable for the following components:   D-Dimer, Quant >20.00 (*)    All other components within normal limits  C-REACTIVE PROTEIN - Abnormal; Notable for the following components:   CRP 19.7 (*)    All other components within normal limits  LACTATE DEHYDROGENASE - Abnormal; Notable for the following components:   LDH 761 (*)    All other components within normal limits  APTT - Abnormal; Notable for the following components:   aPTT 38 (*)    All other components within normal limits  PROTIME-INR - Abnormal; Notable for the following components:   Prothrombin Time 19.4 (*)    INR 1.6 (*)    All other components within normal limits  TROPONIN I (HIGH SENSITIVITY) - Abnormal; Notable for the following components:   Troponin I (High Sensitivity) 164 (*)    All other components within normal limits  TROPONIN I (HIGH SENSITIVITY) - Abnormal; Notable  for the following components:   Troponin I (High Sensitivity) 205 (*)    All other components within normal limits  CULTURE, BLOOD (ROUTINE X 2)  CULTURE, BLOOD (ROUTINE X 2)  PROCALCITONIN  CBC WITH DIFFERENTIAL/PLATELET  COMPREHENSIVE METABOLIC PANEL  C-REACTIVE PROTEIN  D-DIMER, QUANTITATIVE (NOT AT [facility])  HEPARIN LEVEL (UNFRACTIONATED)  CBC  TYPE AND SCREEN  TYPE AND SCREEN    EKG EKG Interpretation  Date/Time:  Wednesday April 19 2019 17:43:40 EST Ventricular Rate:  109 PR Interval:    QRS Duration: 96 QT Interval:  339 QTC Calculation: 442 R Axis:     Text Interpretation: Atrial flutter Repol abnrm suggests ischemia, inferior leads When compared to prior, similar a flutter. no STEMI Confirmed by Antony Blackbird [phone]) on 04/19/2019 6:05:08 PM   Radiology DG Chest Portable 1 View  Result Date: 04/19/2019 CLINICAL DATA:  Hypoxia. EXAM: PORTABLE CHEST 1 VIEW COMPARISON:  December 09, 2013 FINDINGS: Mild hazy infiltrates are seen within the bilateral lung bases. There is no evidence of a pleural effusion or pneumothorax. The cardiac silhouette is mildly enlarged. The visualized skeletal structures are unremarkable. IMPRESSION: Mild hazy bibasilar infiltrates. Electronically Signed   By: Virgina Norfolk M.D.   On: 04/19/2019 18:54    Procedures Procedures (including critical care time)  Darion Colantonio was evaluated in Emergency Department on 04/19/2019 for the symptoms described in the history of present illness. He was evaluated in the context of the global COVID-19 pandemic, which necessitated consideration that the patient might be at risk for infection with the SARS-CoV-2 virus that causes COVID-19. Institutional protocols and algorithms that pertain to the evaluation of patients at risk for COVID-19 are in a state of rapid change based on information released by regulatory bodies including the CDC and federal and state organizations. These policies and algorithms were  followed during the patient's care in the ED.  CRITICAL CARE Performed by: Gwenyth Allegra Lavenia Stumpo Total critical care time: 45 minutes Critical care time was exclusive of separately billable procedures and treating other patients. Critical care was necessary to treat or prevent imminent or life-threatening deterioration. Critical care was time spent personally by me on the following activities: development of treatment plan with patient and/or surrogate as well as nursing, discussions with consultants, evaluation of patient's response to treatment, examination of patient, obtaining history from patient or surrogate, ordering and performing treatments and interventions, ordering and review of laboratory studies, ordering and review of radiographic studies, pulse oximetry and re-evaluation of patient's condition.   Medications Ordered in ED Medications  0.9 %  sodium chloride infusion ( Intravenous New Bag/Given 04/19/19 2052)  remdesivir 200 mg in sodium chloride 0.9% 250 mL IVPB (has no administration in time range)    Followed by  remdesivir 100 mg in sodium chloride 0.9 % 100 mL IVPB (has no administration in time range)  dexamethasone (DECADRON) injection 6 mg (0 mg Intravenous Hold 04/19/19 2123)  ascorbic acid (VITAMIN C) tablet 500 mg (500 mg Oral Given 04/20/19 0034)  zinc sulfate capsule 220 mg (220 mg Oral Given 04/20/19 0034)  ondansetron (ZOFRAN) tablet 4 mg (has no administration in time range)    Or  ondansetron (ZOFRAN) injection 4 mg (has no administration in time range)  heparin ADULT infusion 100 units/mL (25000 units/249mL sodium  chloride 0.45%) (1,900 Units/hr Intravenous New Bag/Given 04/19/19 2054)  dexamethasone (DECADRON) injection 6 mg (6 mg Intravenous Given 04/19/19 2016)  heparin bolus via infusion 5,000 Units (5,000 Units Intravenous Bolus from Bag 04/19/19 2053)    ED Course  I have reviewed the triage vital signs and the nursing notes.  Pertinent labs & imaging results  that were available during my care of the patient were reviewed by me and considered in my medical decision making (see chart for details).    MDM Rules/Calculators/A&P                      Kysen Wetherington is a 74 y.o. male with a past medical history significant for atrial flutter, hypertension, obesity, and known COVID-19 infection who presents with shortness of breath, cough, and hypoxia.  According to patient, he had worsening breathing for the last few days prompting the call for help this afternoon with shortness of breath.  EMS found patient to have an oxygen saturation of 40% on room air and on a nonrebreather, his oxygen saturations are now in the upper 80s/low 90s.  He denies any pain just the shortness of breath.  He denies history of DVT or PE and denies any leg pain or leg swelling.  He denies any other symptoms such as nausea, vomiting, diarrhea, or urinary symptoms.  He is just short of breath.  He denies any trauma.  He denies other complaints.  On exam, breath sounds are coarse bilaterally.  He is tachypneic in the low 20s which is improved from prior according to documentation.  His sats are now in the low 90s on the nonrebreather.  He is slightly tachycardic.  Patient denies other complaints and is answering questions.  Legs are slightly edematous but nontender.  Exam otherwise unremarkable.  Clinically I suspect his hypoxia is due to the COVID-19 infection.  He does not appear to have history of heart failure however will get a BNP given the edema in the legs and his hypoxia.  Due to the tachycardia, profound hypoxia, and the shortness of breath, will get D-dimer.  Patient will be admitted for hypoxia and COVID-19 infection after work-up is completed.  7:44 PM Work-up continue to return.  Patient's creatinine unfortunately is up to 4.1 from 1.8.  He does have acute kidney injury with some electrolyte imbalance.  Troponin is elevated as is BNP.  D-dimer is greater than 20.   Clinically I am now concerned that he does have pulmonary embolism causing heart strain along with his COVID-19 infection.  I ordered a VQ scan and spoke with the technologist and as he has Covid, they will not be able do a partial VQ scan on him tonight and it would likely be much later this evening or in the early morning.  I told her to hold on the imaging for now until I speak with the admitting hospitalist team to see if they still want it.  Anticipate we will heparinize him prophylactically during his admission.  Spoke to hospitalist and they requested canceling the VQ scan.  Heparin was started.  They requested remdesivir being ordered as well as Decadron.  Patient will be admitted for further management.   Final Clinical Impression(s) / ED Diagnoses Final diagnoses:  COVID-19  Acute respiratory failure with hypoxia (HCC)  Respiratory distress  Hypoxia  Positive D dimer  Troponin level elevated    Rx / DC Orders ED Discharge Orders    None  Clinical Impression: 1. COVID-19   2. Acute respiratory failure with hypoxia (HCC)   3. Respiratory distress   4. Hypoxia   5. Positive D dimer   6. Troponin level elevated     Disposition: Admit  This note was prepared with assistance of Dragon voice recognition software. Occasional wrong-word or sound-a-like substitutions may have occurred due to the inherent limitations of voice recognition software.     Mariangel Ringley, Gwenyth Allegra, MD 04/20/19 [phone]

## 2019-04-19 NOTE — Progress Notes (Signed)
ANTICOAGULATION CONSULT NOTE - Initial Consult  Pharmacy Consult for heparin Indication: pulmonary embolus  Allergies  Allergen Reactions  . Aspirin   . Ibuprofen   . Tylenol [Acetaminophen]     Patient Measurements:   Heparin Dosing Weight: 115kg (last wt 149.7kg on 03/07/19)  Vital Signs: Temp: 97.8 F (36.6 C) (01/06 1737) BP: 128/87 (01/06 2000) Pulse Rate: 107 (01/06 2000)  Labs: Recent Labs    04/19/19 1820  HGB 9.6*  HCT 30.0*  PLT 176  CREATININE 4.19*  TROPONINIHS 164*    CrCl cannot be calculated (Unknown ideal weight.).   Medical History: Past Medical History:  Diagnosis Date  . Atrial flutter ([place])    s/p cardioversion 06/2010  . Bradycardia    a. Accelerated junctional & sinus bradycardia during 01/2013 adm.  . Chronic headaches   . Degenerative joint disease     End-stage degenerative joint disease, left knee  . Erectile dysfunction   . HTN (hypertension)   . Hypertensive heart disease    a. Admit for CP 01/2013 felt due to this. Cath with normal coronary anatomy.  . Microcytic anemia  03/22/2001  . Morbid obesity ([place])   . NSVT (nonsustained ventricular tachycardia) ([place])    a. During 01/2013 adm.  . Obesity   . Osteoarthritis   . Tendonitis   . Tobacco chew use     History of chewing tobacco usage.   . Urinary tract infection     Pseudomonas urinary tract infection     Assessment: [name].  Pharmacy consulted to dose heparin for PE.  No prior [facility]  04/19/2019 Hgb 9.6 Plts WNL DDimer > 20 Scr 4.19  Goal of Therapy:  Heparin level 0.3-0.7 units/ml Monitor platelets by anticoagulation protocol   Plan:  Baseline ptt and pt/inr ordered stat Heparin bolus 5000 units x 1 Start heparin drip at 1900 units/hr Check heparin level in 8 hours Daily CBC  Dolly Rias RPh 04/19/2019, 8:26 PM

## 2019-04-19 NOTE — ED Notes (Signed)
This RN, walked into the room and saw the patients NRB off. This RN asked the patient to place the mask back on. The patient stated "it's not helping me". Patient was educated on how the supplemental oxygen was helping. NRB replaced.

## 2019-04-19 NOTE — ED Notes (Signed)
[facility] was called for transportation. Paperwork at nurses station.

## 2019-04-19 NOTE — ED Triage Notes (Signed)
EMS reports from home, Pt states Covid + last week, called out for SOB, Sp02 on site 40 RA, placed non rebreather @ 15ltrs Sp02 rose 80. Initially Pt could not speak but was able to begin speaking after 02 admin. Pt arrives in respiratory distress, Sp02 @ 15lt non rebreather 79.  BP 150 HR 120 RR 30 Sp02 40 RA Temp 97.4

## 2019-04-19 NOTE — H&P (Signed)
History and Physical    Nathan Grant OFB:[id number] DOB: June 27, 1945 DOA: 04/19/2019  PCP: Forrest Moron, MD  Patient coming from: home  Chief Complaint:  sob  HPI: Nathan Grant is a 74 y.o. male with medical history significant of aflutter, HTN comes in with 3 days of worsening sob, tested pos for covid.  No n/v/d.  No pain anywhere.  02 sats in the 40s on RA on arrival , currently on NRB and is feeling much better.  Oriented and mental status clear.  Has not been eating and drinking very well.  Patient found to have Covid pneumonia bilaterally with acute hypoxic respiratory failure and referred for admission for such.  In the process of getting Decadron and remdesivir.  Review of Systems: As per HPI otherwise 10 point review of systems negative.   Past Medical History:  Diagnosis Date  . Atrial flutter ([place])    s/p cardioversion 06/2010  . Bradycardia    a. Accelerated junctional & sinus bradycardia during 01/2013 adm.  . Chronic headaches   . Degenerative joint disease     End-stage degenerative joint disease, left knee  . Erectile dysfunction   . HTN (hypertension)   . Hypertensive heart disease    a. Admit for CP 01/2013 felt due to this. Cath with normal coronary anatomy.  . Microcytic anemia  03/22/2001  . Morbid obesity ([place])   . NSVT (nonsustained ventricular tachycardia) ([place])    a. During 01/2013 adm.  . Obesity   . Osteoarthritis   . Tendonitis   . Tobacco chew use     History of chewing tobacco usage.   . Urinary tract infection     Pseudomonas urinary tract infection    Past Surgical History:  Procedure Laterality Date  . CARDIOVERSION  07/01/2010  . LEFT HEART CATHETERIZATION WITH CORONARY ANGIOGRAM N/A 01/16/2013   Procedure: LEFT HEART CATHETERIZATION WITH CORONARY ANGIOGRAM;  Surgeon: Peter M Martinique, MD;  Location: [facility] CATH LAB;  Service: Cardiovascular;  Laterality: N/A;  . TOTAL KNEE ARTHROPLASTY  May 2009   Bilateral  . VASECTOMY       reports  that he has never smoked. His smokeless tobacco use includes chew. He reports that he does not drink alcohol or use drugs.  Allergies  Allergen Reactions  . Aspirin   . Ibuprofen   . Tylenol [Acetaminophen]     Family History  Problem Relation Age of Onset  . Alcohol abuse Brother   . Hypertension Brother   . Hypertension Mother     Prior to Admission medications   Medication Sig Start Date End Date Taking? Authorizing Provider  albuterol (VENTOLIN HFA) 108 (90 Base) MCG/ACT inhaler Inhale 2 puffs into the lungs every 6 (six) hours as needed for wheezing. 04/17/19  Yes Rutherford Guys, MD  allopurinol (ZYLOPRIM) 100 MG tablet Take 1 tablet by mouth once daily 01/23/19  Yes Stallings, Zoe A, MD  amLODipine (NORVASC) 10 MG tablet Take 1 tablet by mouth once daily 01/30/19  Yes Stallings, Zoe A, MD  Ascorbic Acid (VITAMIN C) 500 MG CAPS Take 1 capsule by mouth daily.   Yes [provider]  aspirin EC 81 MG tablet Take 81 mg by mouth daily.   Yes [provider]  b complex vitamins tablet Take 1 tablet by mouth daily.   Yes [provider]  cholecalciferol (VITAMIN D3) 25 MCG (1000 UT) tablet Take 1,000 Units by mouth daily.   Yes [provider]  guaiFENesin ([place]) 600  MG 12 hr tablet Take 600 mg by mouth 2 (two) times daily as needed for cough.   Yes [provider]  lisinopril (ZESTRIL) 40 MG tablet Take 40 mg by mouth daily. 02/27/19  Yes [provider]  losartan (COZAAR) 100 MG tablet Take 1 tablet (100 mg total) by mouth daily. 03/28/19  Yes Delia Chimes A, MD  metoprolol succinate (TOPROL-XL) 25 MG 24 hr tablet Take 1 tablet (25 mg total) by mouth daily. 12/15/18  Yes Forrest Moron, MD  Multiple Vitamins-Minerals (AIRBORNE PO) Take 1 tablet by mouth daily.   Yes [provider]  Multiple Vitamins-Minerals (CENTRUM SILVER PO) Take 1 tablet by mouth daily.   Yes [provider]  pravastatin (PRAVACHOL) 40 MG  tablet TAKE 1 TABLET BY MOUTH ONCE DAILY IN THE EVENING 01/30/19  Yes Stallings, Zoe A, MD  colchicine 0.6 MG tablet Take 0.6mg  three times a day on day one of gout flare then after that take 0.6mg  by mouth twice daily until gout flare stops. Patient not taking: Reported on 04/19/2019 12/15/18   Forrest Moron, MD  fluticasone (FLONASE) 50 MCG/ACT nasal spray Place 2 sprays into both nostrils daily. 03/08/18   Forrest Moron, MD  spironolactone (ALDACTONE) 25 MG tablet Take 1 tablet (25 mg total) by mouth daily. 12/15/18   Forrest Moron, MD  sucralfate (CARAFATE) 1 GM/10ML suspension Take 10 mLs (1 g total) by mouth 4 (four) times daily -  with meals and at bedtime. Patient not taking: Reported on 04/19/2019 12/04/17   Varney Biles, MD    Physical Exam: Vitals:   04/19/19 1748 04/19/19 1830 04/19/19 2000 04/19/19 2100  BP:  (!) 148/88 128/87 (!) 114/96  Pulse: (!) 111 (!) 108 (!) 107 95  Resp: (!) 26 (!) 26 (!) 25 (!) 26  Temp:      SpO2: 94% 92% 92% 90%      Constitutional: NAD, calm, comfortable Vitals:   04/19/19 1748 04/19/19 1830 04/19/19 2000 04/19/19 2100  BP:  (!) 148/88 128/87 (!) 114/96  Pulse: (!) 111 (!) 108 (!) 107 95  Resp: (!) 26 (!) 26 (!) 25 (!) 26  Temp:      SpO2: 94% 92% 92% 90%   Eyes: PERRL, lids and conjunctivae normal ENMT: Mucous membranes are moist. Posterior pharynx clear of any exudate or lesions.Normal dentition.  Neck: normal, supple, no masses, no thyromegaly Respiratory: clear to auscultation bilaterally, no wheezing, no crackles. Normal respiratory effort. No accessory muscle use.  Cardiovascular: Regular rate and rhythm, no murmurs / rubs / gallops. No extremity edema. 2+ pedal pulses. No carotid bruits.  Abdomen: no tenderness, no masses palpated. No hepatosplenomegaly. Bowel sounds positive.  Musculoskeletal: no clubbing / cyanosis. No joint deformity upper and lower extremities. Good ROM, no contractures. Normal muscle tone.  Skin: no rashes,  lesions, ulcers. No induration Neurologic: CN 2-12 grossly intact. Sensation intact, DTR normal. Strength 5/5 in all 4.  Psychiatric: Normal judgment and insight. Alert and oriented x 3. Normal mood.    Labs on Admission: I have personally reviewed following labs and imaging studies  CBC: Recent Labs  Lab 04/19/19 1820  WBC 12.2*  NEUTROABS 10.0*  HGB 9.6*  HCT 30.0*  MCV 70.4*  PLT 790   Basic Metabolic Panel: Recent Labs  Lab 04/19/19 1820  NA 134*  K 5.2*  CL 101  CO2 19*  GLUCOSE 176*  BUN 77*  CREATININE 4.19*  CALCIUM 7.9*   GFR: CrCl cannot  be calculated (Unknown ideal weight.). Liver Function Tests: Recent Labs  Lab 04/19/19 1820  AST 56*  ALT 33  ALKPHOS 58  BILITOT 1.4*  PROT 7.0  ALBUMIN 2.7*   No results for input(s): LIPASE, AMYLASE in the last 168 hours. No results for input(s): AMMONIA in the last 168 hours. Coagulation Profile: Recent Labs  Lab 04/19/19 2017  INR 1.6*   Cardiac Enzymes: No results for input(s): CKTOTAL, CKMB, CKMBINDEX, TROPONINI in the last 168 hours. BNP (last 3 results) No results for input(s): PROBNP in the last 8760 hours. HbA1C: No results for input(s): HGBA1C in the last 72 hours. CBG: No results for input(s): GLUCAP in the last 168 hours. Lipid Profile: No results for input(s): CHOL, HDL, LDLCALC, TRIG, CHOLHDL, LDLDIRECT in the last 72 hours. Thyroid Function Tests: No results for input(s): TSH, T4TOTAL, FREET4, T3FREE, THYROIDAB in the last 72 hours. Anemia Panel: No results for input(s): VITAMINB12, FOLATE, FERRITIN, TIBC, IRON, RETICCTPCT in the last 72 hours. Urine analysis:    Component Value Date/Time   COLORURINE YELLOW 12/09/2013 0957   APPEARANCEUR CLOUDY (A) 12/09/2013 0957   LABSPEC 1.017 12/09/2013 0957   PHURINE 6.0 12/09/2013 0957   GLUCOSEU NEGATIVE 12/09/2013 0957   HGBUR NEGATIVE 12/09/2013 0957   BILIRUBINUR negative 03/08/2018 1022   KETONESUR negative 03/08/2018 1022   KETONESUR  NEGATIVE 12/09/2013 0957   PROTEINUR =100 (A) 03/08/2018 1022   PROTEINUR NEGATIVE 12/09/2013 0957   UROBILINOGEN 1.0 03/08/2018 1022   UROBILINOGEN 1.0 12/09/2013 0957   NITRITE Negative 03/08/2018 1022   NITRITE NEGATIVE 12/09/2013 0957   LEUKOCYTESUR Small (1+) (A) 03/08/2018 1022   Sepsis Labs: !!!!!!!!!!!!!!!!!!!!!!!!!!!!!!!!!!!!!!!!!!!! @LABRCNTIP (procalcitonin:4,lacticidven:4) ) Recent Results (from the past 240 hour(s))  Novel Coronavirus, NAA ([facility])     Status: Abnormal   Collection Time: 04/12/19 10:58 AM   Specimen: Nasopharyngeal(NP) swabs in vial transport medium   NASOPHARYNGE  TESTING  Result Value Ref Range Status   SARS-CoV-2, NAA Detected (A) Not Detected Final    Comment: Testing was performed using the cobas(R) SARS-CoV-2 test. This nucleic acid amplification test was developed and its performance characteristics determined by [employer]. Nucleic acid amplification tests include PCR and TMA. This test has not been FDA cleared or approved. This test has been authorized by FDA under an Emergency Use Authorization (EUA). This test is only authorized for the duration of time the declaration that circumstances exist justifying the authorization of the emergency use of in vitro diagnostic tests for detection of SARS-CoV-2 virus and/or diagnosis of COVID-19 infection under section 564(b)(1) of the Act, 21 U.S.C. 595GLO-7(F) (1), unless the authorization is terminated or revoked sooner. When diagnostic testing is negative, the possibility of a false negative result should be considered in the context of a patient's recent exposures and the presence of clinical signs and symptoms consistent with COVID-19. An individual without symptoms  of COVID-19 and who is not shedding SARS-CoV-2 virus would expect to have a negative (not detected) result in this assay.   Blood culture (routine x 2)     Status: None (Preliminary result)   Collection Time: 04/19/19   6:20 PM   Specimen: BLOOD  Result Value Ref Range Status   Specimen Description   Final    BLOOD RIGHT ANTECUBITAL Performed at [facility] [address] [place], [place] [address]    Special Requests   Final    BOTTLES DRAWN AEROBIC ONLY Blood Culture adequate volume Performed at [facility], [place]  [address], [place], [place] [address]    Culture PENDING  Incomplete   Report Status PENDING  Incomplete  Blood culture (routine x 2)     Status: None (Preliminary result)   Collection Time: 04/19/19  6:35 PM   Specimen: BLOOD  Result Value Ref Range Status   Specimen Description   Final    BLOOD LEFT ANTECUBITAL Performed at [facility] [address] [place], [place] [address]    Special Requests   Final    BOTTLES DRAWN AEROBIC ONLY Blood Culture adequate volume Performed at [facility], [place] [address], [place], [place] [address]    Culture PENDING  Incomplete   Report Status PENDING  Incomplete     Radiological Exams on Admission: DG Chest Portable 1 View  Result Date: 04/19/2019 CLINICAL DATA:  Hypoxia. EXAM: PORTABLE CHEST 1 VIEW COMPARISON:  December 09, 2013 FINDINGS: Mild hazy infiltrates are seen within the bilateral lung bases. There is no evidence of a pleural effusion or pneumothorax. The cardiac silhouette is mildly enlarged. The visualized skeletal structures are unremarkable. IMPRESSION: Mild hazy bibasilar infiltrates. Electronically Signed   By: Virgina Norfolk M.D.   On: 04/19/2019 18:54    Old chart reviewed Case cussed with Dr. Sherry Ruffing in the emergency department Chest x-ray reviewed bilateral bibasilar infiltrates  Assessment/Plan 74 year old male with acute  respiratory hypoxic failure secondary to bilateral Covid pneumonia who also has acute kidney injury  Principal Problem:   Pneumonia due to COVID-19 virus-have discussed remdesivir and Decadron with the patient he is agreeable to take both  medications to try to help prevent him from getting worse.  He feels much better on nonrebreather.  O2 sats are in the 95% range.  Also placed on vitamin C and zinc.  Also placed on heparin drip for dimer over 20.  Check CRP level.  Daily inflammatory markers will be monitored.  Admit to [facility].  Currently mentating normally.  Active Problems:   Acute respiratory failure due to COVID-19 (HCC)-as above    Atrial flutter (HCC)-currently rate controlled    Acute on chronic renal failure (HCC)-patient baseline renal function is 1.8 is bumped up to 4.1.  Gentle IV fluids and monitor renal function closely.  Patient reports urinary output.  We will also monitor I's and O's closely.  Avoid nephrotoxic substances.    HTN (hypertension)-stable    Chronic diastolic CHF (congestive heart failure) (HCC)-stable at this time   I discussed patient's CODE STATUS with him.  He wants to be full code but only for a limited amount of time.  He is never had this discussion with anyone before and he is going to think about whether or not he wants to be DNR.  Currently he would like to be full code and would like for his wife to take responsibility for his decisions if ever needed.  However he knows does not want extreme measures for prolonged period of time which was not defined.   DVT prophylaxis: Heparin drip Code Status: Full Family Communication: None Disposition Plan: Days or weeks Consults called: None Admission status: Admit to [place] MD [facility]  If 7PM-7AM, please contact night-coverage [email or web address] Password [email or web address]  04/19/2019, 9:50 PM

## 2019-04-19 NOTE — ED Notes (Signed)
Date and time results received: 04/19/19 7:02 PM  (use smartphrase ".now" to insert current time)  Test: Lactic acid Critical Value: 3.2  Name of Provider Notified: Dr. Sherry Ruffing  Orders Received? Or Actions Taken?:

## 2019-04-19 NOTE — ED Notes (Signed)
Attempted to call report x 1  

## 2019-04-19 NOTE — ED Notes (Signed)
Date and time results received: 04/19/19 7:25 PM    Test: troponin Critical Value: 164  Name of Provider Notified: Tegeler, md

## 2019-04-19 NOTE — ED Notes (Signed)
Nathan Grant (wife) [phone]

## 2019-04-19 NOTE — ED Notes (Signed)
Provider informed of Lab value Lactic 3.2

## 2019-04-20 ENCOUNTER — Inpatient Hospital Stay (HOSPITAL_COMMUNITY): Payer: Medicare HMO

## 2019-04-20 DIAGNOSIS — I483 Typical atrial flutter: Secondary | ICD-10-CM

## 2019-04-20 DIAGNOSIS — U071 COVID-19: Secondary | ICD-10-CM

## 2019-04-20 DIAGNOSIS — I5032 Chronic diastolic (congestive) heart failure: Secondary | ICD-10-CM

## 2019-04-20 DIAGNOSIS — J1282 Pneumonia due to coronavirus disease 2019: Secondary | ICD-10-CM

## 2019-04-20 DIAGNOSIS — I361 Nonrheumatic tricuspid (valve) insufficiency: Secondary | ICD-10-CM

## 2019-04-20 DIAGNOSIS — N171 Acute kidney failure with acute cortical necrosis: Secondary | ICD-10-CM

## 2019-04-20 DIAGNOSIS — I371 Nonrheumatic pulmonary valve insufficiency: Secondary | ICD-10-CM

## 2019-04-20 DIAGNOSIS — J9601 Acute respiratory failure with hypoxia: Secondary | ICD-10-CM

## 2019-04-20 DIAGNOSIS — J9600 Acute respiratory failure, unspecified whether with hypoxia or hypercapnia: Secondary | ICD-10-CM

## 2019-04-20 DIAGNOSIS — R7989 Other specified abnormal findings of blood chemistry: Secondary | ICD-10-CM

## 2019-04-20 DIAGNOSIS — N1832 Chronic kidney disease, stage 3b: Secondary | ICD-10-CM

## 2019-04-20 LAB — CBC WITH DIFFERENTIAL/PLATELET
Abs Immature Granulocytes: 0.51 10*3/uL — ABNORMAL HIGH (ref 0.00–0.07)
Basophils Absolute: 0 10*3/uL (ref 0.0–0.1)
Basophils Relative: 0 %
Eosinophils Absolute: 0 10*3/uL (ref 0.0–0.5)
Eosinophils Relative: 0 %
HCT: 28.5 % — ABNORMAL LOW (ref 39.0–52.0)
Hemoglobin: 9.1 g/dL — ABNORMAL LOW (ref 13.0–17.0)
Immature Granulocytes: 4 %
Lymphocytes Relative: 9 %
Lymphs Abs: 1.1 10*3/uL (ref 0.7–4.0)
MCH: 22 pg — ABNORMAL LOW (ref 26.0–34.0)
MCHC: 31.9 g/dL (ref 30.0–36.0)
MCV: 69 fL — ABNORMAL LOW (ref 80.0–100.0)
Monocytes Absolute: 0.4 10*3/uL (ref 0.1–1.0)
Monocytes Relative: 4 %
Neutro Abs: 9.7 10*3/uL — ABNORMAL HIGH (ref 1.7–7.7)
Neutrophils Relative %: 83 %
Platelets: 167 10*3/uL (ref 150–400)
RBC: 4.13 MIL/uL — ABNORMAL LOW (ref 4.22–5.81)
RDW: 17 % — ABNORMAL HIGH (ref 11.5–15.5)
WBC: 11.8 10*3/uL — ABNORMAL HIGH (ref 4.0–10.5)
nRBC: 1.6 % — ABNORMAL HIGH (ref 0.0–0.2)

## 2019-04-20 LAB — CBC
HCT: 30.7 % — ABNORMAL LOW (ref 39.0–52.0)
Hemoglobin: 9.9 g/dL — ABNORMAL LOW (ref 13.0–17.0)
MCH: 22.2 pg — ABNORMAL LOW (ref 26.0–34.0)
MCHC: 32.2 g/dL (ref 30.0–36.0)
MCV: 69 fL — ABNORMAL LOW (ref 80.0–100.0)
Platelets: 283 10*3/uL (ref 150–400)
RBC: 4.45 MIL/uL (ref 4.22–5.81)
RDW: 16.9 % — ABNORMAL HIGH (ref 11.5–15.5)
WBC: 17.3 10*3/uL — ABNORMAL HIGH (ref 4.0–10.5)
nRBC: 1.1 % — ABNORMAL HIGH (ref 0.0–0.2)

## 2019-04-20 LAB — C-REACTIVE PROTEIN: CRP: 18.8 mg/dL — ABNORMAL HIGH (ref <1.0)

## 2019-04-20 LAB — URINALYSIS, ROUTINE W REFLEX MICROSCOPIC
Bilirubin Urine: NEGATIVE
Glucose, UA: NEGATIVE mg/dL
Ketones, ur: NEGATIVE mg/dL
Nitrite: NEGATIVE
Protein, ur: 30 mg/dL — AB
RBC / HPF: >50 RBC/hpf — ABNORMAL HIGH (ref 0–5)
Specific Gravity, Urine: 1.014 (ref 1.005–1.030)
WBC, UA: >50 WBC/hpf — ABNORMAL HIGH (ref 0–5)
pH: 5 (ref 5.0–8.0)

## 2019-04-20 LAB — COMPREHENSIVE METABOLIC PANEL
ALT: 40 U/L (ref 0–44)
AST: 73 U/L — ABNORMAL HIGH (ref 15–41)
Albumin: 2.7 g/dL — ABNORMAL LOW (ref 3.5–5.0)
Alkaline Phosphatase: 62 U/L (ref 38–126)
Anion gap: 15 (ref 5–15)
BUN: 88 mg/dL — ABNORMAL HIGH (ref 8–23)
CO2: 17 mmol/L — ABNORMAL LOW (ref 22–32)
Calcium: 7.8 mg/dL — ABNORMAL LOW (ref 8.9–10.3)
Chloride: 102 mmol/L (ref 98–111)
Creatinine, Ser: 4.48 mg/dL — ABNORMAL HIGH (ref 0.61–1.24)
GFR calc Af Amer: 14 mL/min — ABNORMAL LOW (ref >60)
GFR calc non Af Amer: 12 mL/min — ABNORMAL LOW (ref >60)
Glucose, Bld: 123 mg/dL — ABNORMAL HIGH (ref 70–99)
Potassium: 5.3 mmol/L — ABNORMAL HIGH (ref 3.5–5.1)
Sodium: 134 mmol/L — ABNORMAL LOW (ref 135–145)
Total Bilirubin: 1.3 mg/dL — ABNORMAL HIGH (ref 0.3–1.2)
Total Protein: 6.9 g/dL (ref 6.5–8.1)

## 2019-04-20 LAB — HEPARIN LEVEL (UNFRACTIONATED)
Heparin Unfractionated: 0.14 IU/mL — ABNORMAL LOW (ref 0.30–0.70)
Heparin Unfractionated: 0.59 IU/mL (ref 0.30–0.70)

## 2019-04-20 LAB — TYPE AND SCREEN
ABO/RH(D): A POS
Antibody Screen: NEGATIVE

## 2019-04-20 LAB — ABO/RH: ABO/RH(D): A POS

## 2019-04-20 LAB — PROTIME-INR
INR: 2.1 — ABNORMAL HIGH (ref 0.8–1.2)
Prothrombin Time: 23.5 seconds — ABNORMAL HIGH (ref 11.4–15.2)

## 2019-04-20 LAB — PROCALCITONIN: Procalcitonin: 2.16 ng/mL

## 2019-04-20 LAB — D-DIMER, QUANTITATIVE: D-Dimer, Quant: >20 ug/mL-FEU — ABNORMAL HIGH (ref 0.00–0.50)

## 2019-04-20 LAB — ECHOCARDIOGRAM COMPLETE
Height: 73 in
Weight: 4878.34 oz

## 2019-04-20 MED ORDER — CHLORHEXIDINE GLUCONATE CLOTH 2 % EX PADS
6.0000 | MEDICATED_PAD | Freq: Every day | CUTANEOUS | Status: DC
  Administered 2019-04-21 – 2019-04-27 (×6): 6 via TOPICAL

## 2019-04-20 MED ORDER — HYDRALAZINE HCL 20 MG/ML IJ SOLN: 10.0000 mg | Freq: Four times a day (QID) | INTRAMUSCULAR | Status: DC | PRN

## 2019-04-20 MED ORDER — ALBUTEROL SULFATE HFA 108 (90 BASE) MCG/ACT IN AERS
2.0000 | INHALATION_SPRAY | RESPIRATORY_TRACT | Status: DC | PRN
  Filled 2019-04-20 (×2): qty 6.7

## 2019-04-20 MED ORDER — POLYETHYLENE GLYCOL 3350 17 G PO PACK
17.0000 g | PACK | Freq: Every day | ORAL | Status: DC | PRN
  Administered 2019-04-22: 12:00:00 17 g via ORAL
  Filled 2019-04-20: qty 1

## 2019-04-20 MED ORDER — SODIUM CHLORIDE 0.9 % IV SOLN
100.0000 mg | Freq: Every day | INTRAVENOUS | Status: AC
  Administered 2019-04-21 – 2019-04-23 (×3): 100 mg via INTRAVENOUS
  Filled 2019-04-20 (×3): qty 20

## 2019-04-20 MED ORDER — HEPARIN BOLUS VIA INFUSION
3000.0000 [IU] | Freq: Once | INTRAVENOUS | Status: AC
  Administered 2019-04-20: 3000 [IU] via INTRAVENOUS
  Filled 2019-04-20: qty 3000

## 2019-04-20 MED ORDER — SODIUM CHLORIDE 0.9 % IV SOLN: 100.0000 mg | Freq: Every day | INTRAVENOUS | Status: DC

## 2019-04-20 MED ORDER — ALTEPLASE (PULMONARY EMBOLISM) INFUSION
100.0000 mg | Freq: Once | INTRAVENOUS | Status: AC
  Administered 2019-04-20: 20:00:00 100 mg via INTRAVENOUS
  Filled 2019-04-20: qty 100

## 2019-04-20 MED ORDER — METHYLPREDNISOLONE SODIUM SUCC 125 MG IJ SOLR
60.0000 mg | Freq: Two times a day (BID) | INTRAMUSCULAR | Status: DC
  Administered 2019-04-20 – 2019-04-22 (×5): 60 mg via INTRAVENOUS
  Filled 2019-04-20 (×5): qty 2

## 2019-04-20 MED ORDER — PANTOPRAZOLE SODIUM 40 MG PO TBEC
40.0000 mg | DELAYED_RELEASE_TABLET | Freq: Every day | ORAL | Status: DC
  Administered 2019-04-20 – 2019-04-28 (×9): 40 mg via ORAL
  Filled 2019-04-20 (×9): qty 1

## 2019-04-20 MED ORDER — SODIUM CHLORIDE 0.9 % IV SOLN
500.0000 mg | INTRAVENOUS | Status: DC
  Administered 2019-04-20 – 2019-04-22 (×3): 500 mg via INTRAVENOUS
  Filled 2019-04-20 (×3): qty 500

## 2019-04-20 MED ORDER — SODIUM CHLORIDE 0.9 % IV SOLN
100.0000 mg | Freq: Once | INTRAVENOUS | Status: AC
  Administered 2019-04-20: 19:00:00 100 mg via INTRAVENOUS
  Filled 2019-04-20: qty 20

## 2019-04-20 MED ORDER — FUROSEMIDE 10 MG/ML IJ SOLN
120.0000 mg | Freq: Once | INTRAVENOUS | Status: AC
  Administered 2019-04-20: 120 mg via INTRAVENOUS
  Filled 2019-04-20: qty 10

## 2019-04-20 MED ORDER — SODIUM CHLORIDE 0.9 % IV SOLN
250.0000 mL | Freq: Once | INTRAVENOUS | Status: AC
  Administered 2019-04-20: 250 mL via INTRAVENOUS

## 2019-04-20 MED ORDER — SODIUM CHLORIDE 0.9 % IV SOLN
1.0000 g | INTRAVENOUS | Status: AC
  Administered 2019-04-20 – 2019-04-24 (×5): 1 g via INTRAVENOUS
  Filled 2019-04-20 (×6): qty 10

## 2019-04-20 NOTE — Progress Notes (Signed)
Pt arrived to ICU with foley catheter in place. Called MariaRN  in PCU for names of RNs placing catheter, per Verdis Frederickson was placed on day shift before she arrived.

## 2019-04-20 NOTE — Progress Notes (Signed)
Vitals checked and 2000 meds given.  Report given to ICU RN Roselyn Reef. Will facilitate transfer now. Pt and family aware of transfer.

## 2019-04-20 NOTE — Plan of Care (Signed)

## 2019-04-20 NOTE — Consult Note (Signed)
[facility] Renal Consultation Note  Requesting MD: Oren Binet, MD Indication for Consultation:  AKI   Chief complaint: shortness of breath  HPI:  Nathan Grant is a 74 y.o. male with a history of CKD, hypertension, diabetes who presented to the hospital with several days of shortness of breath.  Hypoxic and has required supplemental oxygen. Was found to have Covid PNA as well as concurrent bacterial PNA and possible PE.  He has been at [facility] and was on 15 L of oxygen per the team at consult this afternoon.  Has been on the progressive care unit. He has  Patient had reported to the team that most of his edema was chronic.  He did get fluids overnight which were stopped this morning per the team.  Requested Lasix IV once on receipt of consult as well as Foley catheter placement.  He has been on 15 liters and is to be transferred to the ICU per nursing.  Spoke again with team and this is in light of scan indicating clot/thrombus in PA with moderate RV dysfunction and severe pulm HTN and possible need for TPA.   Due to the nature of this patient's [id number] with isolation and in keeping with efforts to prevent the spread of infection and to conserve personal protective equipment, a physical exam was not personally performed.  Patient's symptoms and exam were discussed in detail with the team and RN.  A chart review of other providers notes and the patient's lab work as well as review of other pertinent studies was performed.  Exam details from prior documentation were reviewed specifically and confirmed with the bedside nurse.     Creatinine, Ser  Date/Time Value Ref Range Status  04/20/2019 03:07 AM 4.48 (H) 0.61 - 1.24 mg/dL Final  04/19/2019 06:20 PM 4.19 (H) 0.61 - 1.24 mg/dL Final  03/07/2019 12:01 PM 1.85 (H) 0.76 - 1.27 mg/dL Final  02/23/2019 08:51 AM 2.19 (H) 0.76 - 1.27 mg/dL Final  10/28/2018 02:44 PM 1.76 (H) 0.76 - 1.27 mg/dL Final  04/28/2018 09:57 AM  1.40 (H) 0.76 - 1.27 mg/dL Final  03/08/2018 11:39 AM 1.47 (H) 0.76 - 1.27 mg/dL Final  01/26/2018 11:36 AM 1.77 (H) 0.76 - 1.27 mg/dL Final  12/03/2017 10:31 PM 1.88 (H) 0.61 - 1.24 mg/dL Final  10/25/2017 10:01 AM 1.72 (H) 0.76 - 1.27 mg/dL Final  10/08/2017 08:29 AM 1.58 (H) 0.76 - 1.27 mg/dL Final  10/03/2017 10:25 AM 1.80 (H) 0.61 - 1.24 mg/dL Final  07/14/2017 10:21 AM 1.57 (H) 0.76 - 1.27 mg/dL Final  03/18/2017 10:26 AM 1.68 (H) 0.76 - 1.27 mg/dL Final  12/09/2013 09:29 AM 1.21 0.50 - 1.35 mg/dL Final  07/07/2013 08:16 AM 1.2 0.4 - 1.5 mg/dL Final  07/04/2013 07:52 AM 1.2 0.4 - 1.5 mg/dL Final  06/20/2013 11:04 AM 1.2 0.4 - 1.5 mg/dL Final  06/13/2013 08:39 AM 1.2 0.4 - 1.5 mg/dL Final  05/23/2013 12:30 PM 1.00 0.50 - 1.35 mg/dL Final  05/02/2013 09:02 AM 1.2 0.4 - 1.5 mg/dL Final  01/16/2013 11:58 AM 1.14 0.50 - 1.35 mg/dL Final  01/15/2013 11:09 AM 1.03 0.50 - 1.35 mg/dL Final  05/30/2012 05:45 AM 1.49 (H) 0.50 - 1.35 mg/dL Final  05/29/2012 03:47 AM 1.28 0.50 - 1.35 mg/dL Final  08/13/2011 08:55 AM 1.2 0.4 - 1.5 mg/dL Final  08/03/2011 09:52 AM 1.1 0.4 - 1.5 mg/dL Final  07/03/2011 08:36 AM 1.1 0.4 - 1.5 mg/dL Final  03/17/2011 09:17 AM 1.4 0.4 - 1.5 mg/dL  Final  03/02/2011 09:36 AM 1.4 0.4 - 1.5 mg/dL Final  08/11/2010 09:08 AM 1.1 0.4 - 1.5 mg/dL Final  06/28/2010 09:37 AM 1.22 0.4 - 1.5 mg/dL Final  12/27/2007 06:33 AM 1.29  Final  12/26/2007 03:20 AM 1.40 DELTA CHECK NOTED  Final  12/25/2007 05:20 AM 2.22 (H)  Final  12/24/2007 05:00 AM 1.76 (H)  Final  12/23/2007 12:04 PM 1.02  Final  08/20/2007 06:15 AM 1.89 (H)  Final  08/19/2007 11:45 PM 1.92 (H)  Final  08/19/2007 06:10 AM 2.50 (H)  Final  08/17/2007 06:05 AM 1.30  Final  08/16/2007 05:58 AM 1.25  Final  08/11/2007 02:44 PM 1.33  Final     PMHx:   Past Medical History:  Diagnosis Date  . Atrial flutter ([place])    s/p cardioversion 06/2010  . Bradycardia    a. Accelerated junctional & sinus bradycardia  during 01/2013 adm.  . Chronic headaches   . Degenerative joint disease     End-stage degenerative joint disease, left knee  . Erectile dysfunction   . HTN (hypertension)   . Hypertensive heart disease    a. Admit for CP 01/2013 felt due to this. Cath with normal coronary anatomy.  . Microcytic anemia  03/22/2001  . Morbid obesity ([place])   . NSVT (nonsustained ventricular tachycardia) ([place])    a. During 01/2013 adm.  . Obesity   . Osteoarthritis   . Tendonitis   . Tobacco chew use     History of chewing tobacco usage.   . Urinary tract infection     Pseudomonas urinary tract infection    Past Surgical History:  Procedure Laterality Date  . CARDIOVERSION  07/01/2010  . LEFT HEART CATHETERIZATION WITH CORONARY ANGIOGRAM N/A 01/16/2013   Procedure: LEFT HEART CATHETERIZATION WITH CORONARY ANGIOGRAM;  Surgeon: Peter M Martinique, MD;  Location: [facility] CATH LAB;  Service: Cardiovascular;  Laterality: N/A;  . TOTAL KNEE ARTHROPLASTY  May 2009   Bilateral  . VASECTOMY      Family Hx:  Family History  Problem Relation Age of Onset  . Alcohol abuse Brother   . Hypertension Brother   . Hypertension Mother     Social History:  reports that he has never smoked. His smokeless tobacco use includes chew. He reports that he does not drink alcohol or use drugs.  Allergies:  Allergies  Allergen Reactions  . Ibuprofen   . Tylenol [Acetaminophen]     Medications: Prior to Admission medications   Medication Sig Start Date End Date Taking? Authorizing Provider  albuterol (VENTOLIN HFA) 108 (90 Base) MCG/ACT inhaler Inhale 2 puffs into the lungs every 6 (six) hours as needed for wheezing. 04/17/19  Yes Rutherford Guys, MD  allopurinol (ZYLOPRIM) 100 MG tablet Take 1 tablet by mouth once daily Patient taking differently: Take 100 mg by mouth daily.  01/23/19  Yes Stallings, Zoe A, MD  amLODipine (NORVASC) 10 MG tablet Take 1 tablet by mouth once daily Patient taking differently: Take 10 mg by  mouth daily.  01/30/19  Yes Forrest Moron, MD  Ascorbic Acid (VITAMIN C) 500 MG CAPS Take 500 mg by mouth daily.    Yes [provider]  aspirin EC 81 MG tablet Take 81 mg by mouth daily.   Yes [provider]  b complex vitamins tablet Take 1 tablet by mouth daily.   Yes [provider]  cholecalciferol (VITAMIN D3) 25 MCG (1000 UT) tablet Take 1,000 Units by mouth daily.   Yes  [provider]  fluticasone (FLONASE) 50 MCG/ACT nasal spray Place 2 sprays into both nostrils daily. 03/08/18  Yes Delia Chimes A, MD  guaiFENesin (MUCINEX) 600 MG 12 hr tablet Take 600 mg by mouth 2 (two) times daily as needed for cough.   Yes [provider]  lisinopril (ZESTRIL) 40 MG tablet Take 40 mg by mouth daily. 02/27/19  Yes [provider]  losartan (COZAAR) 100 MG tablet Take 1 tablet (100 mg total) by mouth daily. 03/28/19  Yes Delia Chimes A, MD  metoprolol succinate (TOPROL-XL) 25 MG 24 hr tablet Take 1 tablet (25 mg total) by mouth daily. 12/15/18  Yes Forrest Moron, MD  Multiple Vitamins-Minerals (AIRBORNE PO) Take 1 tablet by mouth daily.   Yes [provider]  Multiple Vitamins-Minerals (CENTRUM SILVER PO) Take 1 tablet by mouth daily.   Yes [provider]  pravastatin (PRAVACHOL) 40 MG tablet TAKE 1 TABLET BY MOUTH ONCE DAILY IN THE EVENING Patient taking differently: Take 40 mg by mouth daily.  01/30/19  Yes Forrest Moron, MD  spironolactone (ALDACTONE) 25 MG tablet Take 1 tablet (25 mg total) by mouth daily. 12/15/18  Yes Forrest Moron, MD  colchicine 0.6 MG tablet Take 0.6mg  three times a day on day one of gout flare then after that take 0.6mg  by mouth twice daily until gout flare stops. Patient not taking: Reported on 04/19/2019 12/15/18   Forrest Moron, MD  sucralfate (CARAFATE) 1 GM/10ML suspension Take 10 mLs (1 g total) by mouth 4 (four) times daily -  with meals and at bedtime. Patient not taking: Reported on  04/19/2019 12/04/17   Varney Biles, MD    I have reviewed the patient's current and reported home medications.  Labs:  BMP Latest Ref Rng & Units 04/20/2019 04/19/2019 03/07/2019  Glucose 70 - 99 mg/dL 123(H) 176(H) 94  BUN 8 - 23 mg/dL 88(H) 77(H) 33(H)  Creatinine 0.61 - 1.24 mg/dL 4.48(H) 4.19(H) 1.85(H)  BUN/Creat Ratio 10 - 24 - - 18  Sodium 135 - 145 mmol/L 134(L) 134(L) 141  Potassium 3.5 - 5.1 mmol/L 5.3(H) 5.2(H) 4.7  Chloride 98 - 111 mmol/L 102 101 103  CO2 22 - 32 mmol/L 17(L) 19(L) 25  Calcium 8.9 - 10.3 mg/dL 7.8(L) 7.9(L) 9.1    Urinalysis    Component Value Date/Time   COLORURINE AMBER (A) 04/20/2019 1545   APPEARANCEUR CLOUDY (A) 04/20/2019 1545   LABSPEC 1.014 04/20/2019 1545   PHURINE 5.0 04/20/2019 1545   GLUCOSEU NEGATIVE 04/20/2019 1545   HGBUR LARGE (A) 04/20/2019 1545   BILIRUBINUR NEGATIVE 04/20/2019 1545   BILIRUBINUR negative 03/08/2018 1022   KETONESUR NEGATIVE 04/20/2019 1545   PROTEINUR 30 (A) 04/20/2019 1545   UROBILINOGEN 1.0 03/08/2018 1022   UROBILINOGEN 1.0 12/09/2013 0957   NITRITE NEGATIVE 04/20/2019 1545   LEUKOCYTESUR LARGE (A) 04/20/2019 1545     ROS:  Pertinent items are noted in HPI. chart reviewed and spoke with team and nursing   Physical Exam: Vitals:   04/20/19 0800 04/20/19 1400  BP: 127/70 133/83  Pulse: 86 88  Resp: (!) 28 11  Temp:    SpO2: (!) 86% 99%     General: adult male in bed in NAD  HEENT:  NCAT  Heart: RRR per charting Lungs: clear lungs per team's exam on 15 liters HFNC  Abdomen: soft and nondistended with obese habitus  Extremities: obese habitus with some chronic edema at least 1-2+ Neuro: alert and oriented per team  Skin - no rash per charting; turgor normal  Psych normal mood and affect   Assessment/Plan:  # Acute renal failure -Secondary to ATN from Covid however also with prerenal insults at home.  Appears to have been on lisinopril and losartan as well as spironolactone.  Renal US without  hydro and mildly increased echogenicity - No acute indication for dialysis however is approaching need -Foley catheter placement - hold lisinopril, losartan, and spironolactone   # COVID-19 pneumonia - Therapies per primary team  # Acute hypoxic respiratory failure - Supplemental oxygen - Treat Covid as above - Lasix IV once earlier today on upon receipt of consult  # CKD stage III  b - baseline Cr 1.8 - 2    # HTN  - controlled  # Chronic diastolic CHF - Lasix as above   # Bilateral lower extremity DVT  - anticoagulation per primary team   Claudia Desanctis 04/20/2019, 6:27 PM

## 2019-04-20 NOTE — Progress Notes (Signed)
[place] for heparin Indication: pulmonary embolus  Allergies  Allergen Reactions  . Ibuprofen   . Tylenol [Acetaminophen]     Patient Measurements: Height: 6\' 1"  (185.4 cm) Weight: (!) 304 lb 14.3 oz (138.3 kg) IBW/kg (Calculated) : 79.9 Heparin Dosing Weight: 115kg (last wt 149.7kg on 03/07/19)  Vital Signs: BP: 133/83 (01/07 1400) Pulse Rate: 88 (01/07 1400)  Labs: Recent Labs    04/19/19 1820 04/19/19 2003 04/19/19 2017 04/20/19 0300 04/20/19 0307 04/20/19 1508  HGB 9.6*  --   --  9.1*  --   --   HCT 30.0*  --   --  28.5*  --   --   PLT 176  --   --  167  --   --   APTT  --   --  38*  --   --   --   LABPROT  --   --  19.4*  --   --   --   INR  --   --  1.6*  --   --   --   HEPARINUNFRC  --   --   --   --  0.14* 0.59  CREATININE 4.19*  --   --   --  4.48*  --   TROPONINIHS 164* 205*  --   --   --   --     Estimated Creatinine Clearance: 21.1 mL/min (A) (by C-G formula based on SCr of 4.48 mg/dL (H)).   Assessment: [name].  Pharmacy consulted to dose heparin for PE.  No prior AC  Heparin level subtherapeutic (0.14) on gtt at 1900 units/hr. RN states that IV was beeping some due to position but nothing major. No bleeding noted.  Heparin level came back therapeutic this PM at 0.59. We will continue the same rate and check a confirm level.  Goal of Therapy:  Heparin level 0.3-0.7 units/ml Monitor platelets by anticoagulation protocol   Plan:   Continue heparin gtt 2250 units/hr Check 6 hr confirm level Daily CBC and heparin level  Onnie Boer, PharmD, [place], AAHIVP, CPP Infectious Disease Pharmacist 04/20/2019 5:23 PM

## 2019-04-20 NOTE — Progress Notes (Signed)
[place] for heparin Indication: pulmonary embolus  Allergies  Allergen Reactions  . Aspirin   . Ibuprofen   . Tylenol [Acetaminophen]     Patient Measurements: Height: 6\' 1"  (185.4 cm) Weight: (!) 304 lb 14.3 oz (138.3 kg) IBW/kg (Calculated) : 79.9 Heparin Dosing Weight: 115kg (last wt 149.7kg on 03/07/19)  Vital Signs: Temp: 98.1 F (36.7 C) (01/07 0458) Temp Source: Oral (01/07 0458) BP: 125/84 (01/07 0458) Pulse Rate: 87 (01/07 0458)  Labs: Recent Labs    04/19/19 1820 04/19/19 2003 04/19/19 2017 04/20/19 0307  HGB 9.6*  --   --   --   HCT 30.0*  --   --   --   PLT 176  --   --   --   APTT  --   --  38*  --   LABPROT  --   --  19.4*  --   INR  --   --  1.6*  --   HEPARINUNFRC  --   --   --  0.14*  CREATININE 4.19*  --   --   --   TROPONINIHS 164* 205*  --   --     Estimated Creatinine Clearance: 22.6 mL/min (A) (by C-G formula based on SCr of 4.19 mg/dL (H)).   Assessment: [name].  Pharmacy consulted to dose heparin for PE.  No prior AC  Heparin level subtherapeutic (0.14) on gtt at 1900 units/hr. RN states that IV was beeping some due to position but nothing major. No bleeding noted.  Goal of Therapy:  Heparin level 0.3-0.7 units/ml Monitor platelets by anticoagulation protocol   Plan:  Rebolus heparin 3000 units now Increase heparin gtt to 2250 units/hr Check heparin level in 8 hours Daily CBC and heparin level  Sherlon Handing, PharmD, BCPS Please see amion for complete clinical pharmacist phone list 04/20/2019, 5:21 AM

## 2019-04-20 NOTE — Progress Notes (Signed)
VASCULAR LAB PRELIMINARY  PRELIMINARY  PRELIMINARY  PRELIMINARY  Bilateral lower extremity venous duplex completed.    Preliminary report:  See CV proc for preliminary results.  Messaged Dr. Sloan Leiter with results.  Eymi Lipuma, RVT 04/20/2019, 1:36 PM

## 2019-04-20 NOTE — Progress Notes (Signed)
NAMERolland Grant, MRN:  [id number], DOB:  1946-04-05, LOS: 1 ADMISSION DATE:  04/19/2019, CONSULTATION DATE:  1/7 REFERRING MD:  Sloan Leiter, CHIEF COMPLAINT:  dyspnea   Brief History   74 y/o male admitted on 1/6 in the setting of severe acute respiratory failure with hypoxemia due to COVID-19 pneumonia.  Noted to be COVID positive on 12/30. On January 7 he was found to have a large pulmonary embolism.  Pulmonary and critical care consulted for the same.  History of present illness   This is a 74 year old male with CKD who was diagnosed with Covid in early January, was following with an outpatient clinic on January 4.  However, symptoms worsened and he went to the emergency room on January 6 complaining of worsening shortness of breath.  He is noted to be profoundly hypoxemic.  He was admitted to our facility for further evaluation.  His procalcitonin was noted to be elevated so he was treated with antibiotics including ceftriaxone and azithromycin.  He was also treated with standard Covid treatment with Decadron and remdesivir.  Because of an elevated D-dimer lower extremity Doppler ultrasounds were performed showing bilateral acute deep vein thrombosis.  An echocardiogram was performed today as well which showed severe pulmonary hypertension, moderate LVH and a clot in his pulmonary embolism.  He is on 15 L of oxygen.  He tells me that he feels short of breath.  He does not think that he is getting any better.  Pulmonary and critical care medicine was consulted for further evaluation of his pulmonary embolism and whether or not thrombolytic therapy could be used.  The patient tells me that he has never had a stroke, he has not had any surgery in the last 14 days, and he denies taking blood thinners.  He says he has never had any ulcers or GI bleeds.  Past Medical History  Osteoarthritis Obesity SVT Morbid obesity Hypertensive heart disease LVH Hypertension Erectile dysfunction Atrial  flutter  Significant Hospital Events   1/6 admission 1/7 transfer to ICU  Consults:  Nephrology PCCM  Procedures:    Significant Diagnostic Tests:  1/7 lower extremity Doppler showed acute bilateral DVT 1/7 transthoracic echocardiogram showed severe pulmonary hypertension, clot in the pulmonary embolism, LVH  Micro Data:  12/30 SARS-CoV-2 positive  Antimicrobials:  1/6 ceftriaxone 1/6 azithromycin 1/6 methylprednisolone 1/6 remdesivir  Interim history/subjective:  As above  Objective   Blood pressure 133/83, pulse 88, temperature 98.1 F (36.7 C), temperature source Oral, resp. rate 11, height 6\' 1"  (1.854 m), weight (!) 138.3 kg, SpO2 99 %.        Intake/Output Summary (Last 24 hours) at 04/20/2019 1914 Last data filed at 04/20/2019 1514 Gross per 24 hour  Intake --  Output 590 ml  Net -590 ml   Filed Weights   04/19/19 2326  Weight: (!) 138.3 kg    Examination:  General:  Resting comfortably in bed HENT: NCAT OP clear PULM: Crackles bases B, normal effort CV: RRR, no mgr GI: BS+, soft, nontender MSK: normal bulk and tone Neuro: awake, alert, no distress, MAEW   Resolved Hospital Problem list     Assessment & Plan:  Acute respiratory failure with hypoxemia due to COVID-19 pneumonia and acute pulmonary embolism Agree with methylprednisolone, continue Continue remdesivir Agree with antibacterial coverage Considering large pulmonary embolism, severe hypoxemia, severe dyspnea, and severe pulmonary hypertension seen on echocardiogram with impending RV failure I discussed the risks and benefits of TPA with the patient at length.  I quoted a risk of bleeding around 1 to 2%.  Fortunately he has no absolute contraindications to thrombolysis.  After discussing the risks and benefits he is willing to proceed with TPA.  We will move him to the ICU and administer it. Continue oxygen administered to maintain O2 saturation greater than 85%  Chronic kidney  disease: Follow-up nephrology recommendations  Atrial flutter: Telemetry   Best practice:  Per TRH  Labs   CBC: Recent Labs  Lab 04/19/19 1820 04/20/19 0300  WBC 12.2* 11.8*  NEUTROABS 10.0* 9.7*  HGB 9.6* 9.1*  HCT 30.0* 28.5*  MCV 70.4* 69.0*  PLT 176 130    Basic Metabolic Panel: Recent Labs  Lab 04/19/19 1820 04/20/19 0307  NA 134* 134*  K 5.2* 5.3*  CL 101 102  CO2 19* 17*  GLUCOSE 176* 123*  BUN 77* 88*  CREATININE 4.19* 4.48*  CALCIUM 7.9* 7.8*   GFR: Estimated Creatinine Clearance: 21.1 mL/min (A) (by C-G formula based on SCr of 4.48 mg/dL (H)). Recent Labs  Lab 04/19/19 1820 04/19/19 2003 04/20/19 0300 04/20/19 0307  PROCALCITON  --  0.96  --  2.16  WBC 12.2*  --  11.8*  --   LATICACIDVEN 3.2* 2.1*  --   --     Liver Function Tests: Recent Labs  Lab 04/19/19 1820 04/20/19 0307  AST 56* 73*  ALT 33 40  ALKPHOS 58 62  BILITOT 1.4* 1.3*  PROT 7.0 6.9  ALBUMIN 2.7* 2.7*   No results for input(s): LIPASE, AMYLASE in the last 168 hours. No results for input(s): AMMONIA in the last 168 hours.  ABG    Component Value Date/Time   TCO2 28 10/03/2017 1025     Coagulation Profile: Recent Labs  Lab 04/19/19 2017  INR 1.6*    Cardiac Enzymes: No results for input(s): CKTOTAL, CKMB, CKMBINDEX, TROPONINI in the last 168 hours.  HbA1C: Hemoglobin A1C  Date/Time Value Ref Range Status  03/07/2019 11:40 AM 6.4 (A) 4.0 - 5.6 % Final  10/28/2018 12:24 PM 6.1 (A) 4.0 - 5.6 % Final   Hgb A1c MFr Bld  Date/Time Value Ref Range Status  03/08/2018 11:39 AM 6.2 (H) 4.8 - 5.6 % Final    Comment:             Prediabetes: 5.7 - 6.4          Diabetes: >6.4          Glycemic control for adults with diabetes: <7.0   03/18/2017 10:26 AM 6.5 (H) 4.8 - 5.6 % Final    Comment:             Prediabetes: 5.7 - 6.4          Diabetes: >6.4          Glycemic control for adults with diabetes: <7.0     CBG: No results for input(s): GLUCAP in the  last 168 hours.  Review of Systems:   Gen: Denies fever, chills, weight change, + fatigue, night sweats HEENT: Denies blurred vision, double vision, hearing loss, tinnitus, sinus congestion, rhinorrhea, sore throat, neck stiffness, dysphagia PULM: per HPI CV: Denies chest pain, edema, orthopnea, paroxysmal nocturnal dyspnea, palpitations GI: Denies abdominal pain, nausea, vomiting, diarrhea, hematochezia, melena, constipation, change in bowel habits GU: Denies dysuria, hematuria, polyuria, oliguria, urethral discharge Endocrine: Denies hot or cold intolerance, polyuria, polyphagia or appetite change Derm: Denies rash, dry skin, scaling or peeling skin change Heme: Denies easy bruising, bleeding, bleeding gums Neuro: Denies  headache, numbness, weakness, slurred speech, loss of memory or consciousness   Past Medical History  He,  has a past medical history of Atrial flutter ([place]), Bradycardia, Chronic headaches, Degenerative joint disease, Erectile dysfunction, HTN (hypertension), Hypertensive heart disease, Microcytic anemia ( 03/22/2001), Morbid obesity ([place]), NSVT (nonsustained ventricular tachycardia) ([place]), Obesity, Osteoarthritis, Tendonitis, Tobacco chew use, and Urinary tract infection.   Surgical History    Past Surgical History:  Procedure Laterality Date  . CARDIOVERSION  07/01/2010  . LEFT HEART CATHETERIZATION WITH CORONARY ANGIOGRAM N/A 01/16/2013   Procedure: LEFT HEART CATHETERIZATION WITH CORONARY ANGIOGRAM;  Surgeon: Peter M Martinique, MD;  Location: [facility] CATH LAB;  Service: Cardiovascular;  Laterality: N/A;  . TOTAL KNEE ARTHROPLASTY  May 2009   Bilateral  . VASECTOMY       Social History   reports that he has never smoked. His smokeless tobacco use includes chew. He reports that he does not drink alcohol or use drugs.   Family History   His family history includes Alcohol abuse in his brother; Hypertension in his brother and mother.   Allergies Allergies  Allergen  Reactions  . Ibuprofen   . Tylenol [Acetaminophen]      Home Medications  Prior to Admission medications   Medication Sig Start Date End Date Taking? Authorizing Provider  albuterol (VENTOLIN HFA) 108 (90 Base) MCG/ACT inhaler Inhale 2 puffs into the lungs every 6 (six) hours as needed for wheezing. 04/17/19  Yes Rutherford Guys, MD  allopurinol (ZYLOPRIM) 100 MG tablet Take 1 tablet by mouth once daily Patient taking differently: Take 100 mg by mouth daily.  01/23/19  Yes Stallings, Zoe A, MD  amLODipine (NORVASC) 10 MG tablet Take 1 tablet by mouth once daily Patient taking differently: Take 10 mg by mouth daily.  01/30/19  Yes Forrest Moron, MD  Ascorbic Acid (VITAMIN C) 500 MG CAPS Take 500 mg by mouth daily.    Yes [provider]  aspirin EC 81 MG tablet Take 81 mg by mouth daily.   Yes [provider]  b complex vitamins tablet Take 1 tablet by mouth daily.   Yes [provider]  cholecalciferol (VITAMIN D3) 25 MCG (1000 UT) tablet Take 1,000 Units by mouth daily.   Yes [provider]  fluticasone (FLONASE) 50 MCG/ACT nasal spray Place 2 sprays into both nostrils daily. 03/08/18  Yes Delia Chimes A, MD  guaiFENesin (MUCINEX) 600 MG 12 hr tablet Take 600 mg by mouth 2 (two) times daily as needed for cough.   Yes [provider]  lisinopril (ZESTRIL) 40 MG tablet Take 40 mg by mouth daily. 02/27/19  Yes [provider]  losartan (COZAAR) 100 MG tablet Take 1 tablet (100 mg total) by mouth daily. 03/28/19  Yes Delia Chimes A, MD  metoprolol succinate (TOPROL-XL) 25 MG 24 hr tablet Take 1 tablet (25 mg total) by mouth daily. 12/15/18  Yes Forrest Moron, MD  Multiple Vitamins-Minerals (AIRBORNE PO) Take 1 tablet by mouth daily.   Yes [provider]  Multiple Vitamins-Minerals (CENTRUM SILVER PO) Take 1 tablet by mouth daily.   Yes [provider]  pravastatin (PRAVACHOL) 40 MG tablet TAKE 1 TABLET BY MOUTH ONCE  DAILY IN THE EVENING Patient taking differently: Take 40 mg by mouth daily.  01/30/19  Yes Forrest Moron, MD  spironolactone (ALDACTONE) 25 MG tablet Take 1 tablet (25 mg total) by mouth daily. 12/15/18  Yes Forrest Moron, MD  colchicine 0.6 MG tablet  Take 0.6mg  three times a day on day one of gout flare then after that take 0.6mg  by mouth twice daily until gout flare stops. Patient not taking: Reported on 04/19/2019 12/15/18   Forrest Moron, MD  sucralfate (CARAFATE) 1 GM/10ML suspension Take 10 mLs (1 g total) by mouth 4 (four) times daily -  with meals and at bedtime. Patient not taking: Reported on 04/19/2019 12/04/17   Varney Biles, MD     Critical care time: 35 minutes     Roselie Awkward, MD [place] PCCM Pager: [phone] Cell: [phone] If no response, call [phone]

## 2019-04-20 NOTE — Progress Notes (Signed)
PROGRESS NOTE                                                                                                                                                                                                             Patient Demographics:    Nathan Grant, is a 74 y.o. male, DOB - [birth year], PJK:[id number]  Outpatient Primary MD for the patient is Forrest Moron, MD   Admit date - 04/19/2019   LOS - 1  Chief Complaint  Patient presents with  . COVID +  . Respiratory Distress       Brief Narrative: Patient is a 74 y.o. male with PMHx of HTN, DM-2, CKD stage IIIb, history of PAF-presented to the ED with 3-day history of shortness of breath-found to have acute hypoxic respiratory failures secondary to COVID-19 pneumonia along with AKI.  Patient was subsequently admitted to the hospitalist service at [facility].   Subjective:    [name] today remains severely hypoxic-on 15 L HFNC.  He is comfortable at rest.   Assessment  & Plan :   Acute Hypoxic Resp Failure due to Covid 19 Viral pneumonia, concurrent bacterial pneumonia and possible pulmonary embolism: Remains very hypoxic on 15 L of HFNC.  Continue steroids and remdesivir-given elevated procalcitonin-avoid Actemra-have started Rocephin/Zithromax.  Suspect that he may have a pulmonary embolism (unable to do a CT chest due to renal function) given he has DVT-although his blood pressure stable-we will go out and check a echocardiogram to make sure there is no significant RV strain.  Given that he does have AKI with some amount of lower extremity edema-worried that he also may have some component of fluid overload causing worsening hypoxemia-trying 1 dose of IV Lasix (see below)  Fever: afebrile  O2 requirements:  SpO2: (!) 86 % O2 Flow Rate (L/min): 15 L/min   COVID-19 Labs: Recent Labs    04/19/19 1820 04/19/19 2003 04/19/19 2019  04/20/19 0307  DDIMER >20.00*  --   --  >20.00*  LDH  --  761*  --   --   CRP  --   --  19.7* 18.8*       Component Value Date/Time   BNP 413.0 (H) 04/19/2019 1820    Recent Labs  Lab 04/19/19 2003 04/20/19 0307  PROCALCITON 0.96 2.16    Lab Results  Component Value Date   SARSCOV2NAA Detected (A) 04/12/2019     COVID-19 Medications: Steroids: 1/6>> Remdesivir:1/6>>  Other medications: Antibiotics: Rocephin: 1/7>> Zithromax: 1/7>>  Prone/Incentive Spirometry: encouraged patient to lie prone for 3-4 hours at a time for a total of 16 hours a day, and to encourage incentive spirometry use 3-4/hour.  DVT Prophylaxis  :   Heparin gtt  AKI on CKD stage IIIb: Suspect this is hemodynamically mediated-awaiting UA and renal ultrasound.  No signs of obstruction on exam-Per nursing staff-bladder scans are negative as well.  Stop IV fluids today-he already has some lower extremity edema-but really no overt signs of volume overload at this point.  Electrolytes relatively stable-no urgent indication for RRT at this point.  Given that he is slightly volume overloaded at this point-we will go out and given Lasix 120 mg IV x1, nephrology recommends that we go ahead and place a Foley catheter-short-term-to ensure strict recording of urine output and to make sure he does not have an obstruction.  Bilateral lower extremity DVT-possible pulmonary embolism: Remains on IV heparin-have ordered an echo given severity of hypoxemia to make sure he does not have RV strain-however his blood pressure is currently stable.  HTN: Remains controlled-without the use of any antihypertensives-he is on multiple blood pressure agents in the outpatient setting.  Will resume when able.  DM-2 (A1c 6.4): Start SSI-monitor CBGs closely while on steroids.  Chronic diastolic heart failure: Apart from chronic lower extremity edema-he has no signs of volume overload.  Trying 1 dose of IV Lasix-see above.  Severe PAF:  Currently in sinus rhythm-anticoagulated for DVT.  Obesity: Estimated body mass index is 40.23 kg/m as calculated from the following:   Height as of this encounter: 6\' 1"  (1.854 m).   Weight as of this encounter: 138.3 kg.    Consults  : Nephrology-Dr. Margo Aye to over the phone.  Procedures  :  None  ABG:    Component Value Date/Time   TCO2 28 10/03/2017 1025    Vent Settings: N/A  Condition - Extremely Guarded  Family Communication  :  Spouse updated over the phone  Code Status :  Full Code  Diet :  Diet Order            Diet Heart Room service appropriate? Yes; Fluid consistency: Thin  Diet effective now               Disposition Plan  :  Remain hospitalized  Barriers to discharge: Hypoxia requiring O2 supplementation/complete 5 days of IV Remdesivir  Antimicorbials  :    Anti-infectives (From admission, onward)   Start     Dose/Rate Route Frequency Ordered Stop   04/21/19 1000  remdesivir 100 mg in sodium chloride 0.9 % 100 mL IVPB     100 mg 200 mL/hr over 30 Minutes Intravenous Daily 04/20/19 0525 04/25/19 0959   04/20/19 1330  cefTRIAXone (ROCEPHIN) 1 g in sodium chloride 0.9 % 100 mL IVPB     1 g 200 mL/hr over 30 Minutes Intravenous Every 24 hours 04/20/19 1308 04/25/19 1329   04/20/19 1315  azithromycin (ZITHROMAX) 500 mg in sodium chloride 0.9 % 250 mL IVPB     500 mg 250 mL/hr over 60 Minutes Intravenous Every 24 hours 04/20/19 1308     04/20/19 1000  remdesivir 100 mg in sodium chloride 0.9 % 100 mL IVPB  Status:  Discontinued     100 mg 200 mL/hr over 30 Minutes Intravenous Daily 04/19/19 2015 04/20/19 0525  04/19/19 2200  remdesivir 200 mg in sodium chloride 0.9% 250 mL IVPB     200 mg 580 mL/hr over 30 Minutes Intravenous Once 04/19/19 2015 04/20/19 0302      Inpatient Medications  Scheduled Meds: . vitamin C  500 mg Oral Daily  . methylPREDNISolone (SOLU-MEDROL) injection  60 mg Intravenous Q12H  . pantoprazole  40 mg Oral  Daily  . zinc sulfate  220 mg Oral Daily   Continuous Infusions: . azithromycin    . cefTRIAXone (ROCEPHIN)  IV    . heparin 2,250 Units/hr (04/20/19 0535)  . [START ON 04/21/2019] remdesivir 100 mg in NS 100 mL     PRN Meds:.albuterol, hydrALAZINE, ondansetron **OR** ondansetron (ZOFRAN) IV, polyethylene glycol   Time Spent in minutes  55  The patient is critically ill with multiple organ system failure and requires high complexity decision making for assessment and support, frequent evaluation and titration of therapies, advanced monitoring, review of radiographic studies and interpretation of complex data.   See all Orders from today for further details   Oren Binet M.D on 04/20/2019 at 1:10 PM  To page go to [email or web address] - use universal password  [facility] -  Office  [phone]    Objective:   Vitals:   04/19/19 2358 04/20/19 0000 04/20/19 0458 04/20/19 0800  BP:  (!) 136/98 125/84 127/70  Pulse:  93 87 86  Resp:  (!) 22 18 (!) 28  Temp: (!) 97.5 F (36.4 C) (!) 97.5 F (36.4 C) 98.1 F (36.7 C)   TempSrc: Oral Oral Oral   SpO2:  93% 98% (!) 86%  Weight:      Height:        Wt Readings from Last 3 Encounters:  04/19/19 (!) 138.3 kg  03/07/19 (!) 149.7 kg  01/27/19 (!) 147.4 kg     Intake/Output Summary (Last 24 hours) at 04/20/2019 1310 Last data filed at 04/20/2019 0232 Gross per 24 hour  Intake --  Output 225 ml  Net -225 ml     Physical Exam Gen Exam:Alert awake-not in any distress HEENT:atraumatic, normocephalic Chest: B/L clear to auscultation anteriorly CVS:S1S2 regular Abdomen:soft non tender, non distended Extremities:++ edema Neurology: Non focal Skin: no rash   Data Review:    CBC Recent Labs  Lab 04/19/19 1820 04/20/19 0300  WBC 12.2* 11.8*  HGB 9.6* 9.1*  HCT 30.0* 28.5*  PLT 176 167  MCV 70.4* 69.0*  MCH 22.5* 22.0*  MCHC 32.0 31.9  RDW 16.4* 17.0*  LYMPHSABS 1.0 1.1  MONOABS 0.6 0.4  EOSABS 0.0 0.0   BASOSABS 0.0 0.0    Chemistries  Recent Labs  Lab 04/19/19 1820 04/20/19 0307  NA 134* 134*  K 5.2* 5.3*  CL 101 102  CO2 19* 17*  GLUCOSE 176* 123*  BUN 77* 88*  CREATININE 4.19* 4.48*  CALCIUM 7.9* 7.8*  AST 56* 73*  ALT 33 40  ALKPHOS 58 62  BILITOT 1.4* 1.3*   ------------------------------------------------------------------------------------------------------------------ No results for input(s): CHOL, HDL, LDLCALC, TRIG, CHOLHDL, LDLDIRECT in the last 72 hours.  Lab Results  Component Value Date   HGBA1C 6.4 (A) 03/07/2019   ------------------------------------------------------------------------------------------------------------------ No results for input(s): TSH, T4TOTAL, T3FREE, THYROIDAB in the last 72 hours.  Invalid input(s): FREET3 ------------------------------------------------------------------------------------------------------------------ No results for input(s): VITAMINB12, FOLATE, FERRITIN, TIBC, IRON, RETICCTPCT in the last 72 hours.  Coagulation profile Recent Labs  Lab 04/19/19 2017  INR 1.6*    Recent Labs    04/19/19 1820 04/20/19 0307  DDIMER >  20.00* >20.00*    Cardiac Enzymes No results for input(s): CKMB, TROPONINI, MYOGLOBIN in the last 168 hours.  Invalid input(s): CK ------------------------------------------------------------------------------------------------------------------    Component Value Date/Time   BNP 413.0 (H) 04/19/2019 1820    Micro Results Recent Results (from the past 240 hour(s))  Novel Coronavirus, NAA ([facility])     Status: Abnormal   Collection Time: 04/12/19 10:58 AM   Specimen: Nasopharyngeal(NP) swabs in vial transport medium   NASOPHARYNGE  TESTING  Result Value Ref Range Status   SARS-CoV-2, NAA Detected (A) Not Detected Final    Comment: Testing was performed using the cobas(R) SARS-CoV-2 test. This nucleic acid amplification test was developed and its performance characteristics  determined by [employer]. Nucleic acid amplification tests include PCR and TMA. This test has not been FDA cleared or approved. This test has been authorized by FDA under an Emergency Use Authorization (EUA). This test is only authorized for the duration of time the declaration that circumstances exist justifying the authorization of the emergency use of in vitro diagnostic tests for detection of SARS-CoV-2 virus and/or diagnosis of COVID-19 infection under section 564(b)(1) of the Act, 21 U.S.C. 867YPP-5(K) (1), unless the authorization is terminated or revoked sooner. When diagnostic testing is negative, the possibility of a false negative result should be considered in the context of a patient's recent exposures and the presence of clinical signs and symptoms consistent with COVID-19. An individual without symptoms  of COVID-19 and who is not shedding SARS-CoV-2 virus would expect to have a negative (not detected) result in this assay.   Blood culture (routine x 2)     Status: None (Preliminary result)   Collection Time: 04/19/19  6:20 PM   Specimen: BLOOD  Result Value Ref Range Status   Specimen Description   Final    BLOOD RIGHT ANTECUBITAL Performed at [facility] [address], [place], [place] [address]    Special Requests   Final    BOTTLES DRAWN AEROBIC ONLY Blood Culture adequate volume   Culture   Final    NO GROWTH < 24 HOURS Performed at [facility], [place] [address] [place], [place] [address]    Report Status PENDING  Incomplete  Blood culture (routine x 2)     Status: None (Preliminary result)   Collection Time: 04/19/19  6:35 PM   Specimen: BLOOD  Result Value Ref Range Status   Specimen Description   Final    BLOOD LEFT ANTECUBITAL Performed at [facility] [address] [place], [place] [address]    Special Requests   Final    BOTTLES DRAWN AEROBIC ONLY Blood Culture adequate volume   Culture    Final    NO GROWTH < 24 HOURS Performed at [facility], [place] [address] [place], [place] [address]    Report Status PENDING  Incomplete    Radiology Reports US Renal  Result Date: 04/20/2019 CLINICAL DATA:  74 year old male with a history of acute kidney injury EXAM: RENAL / URINARY TRACT ULTRASOUND COMPLETE COMPARISON:  None. FINDINGS: Right Kidney: Length: 11.7 cm x 4.8 cm x 6.1 cm, 181 cc. Echogenicity of the right renal cortex relatively increased. No hydronephrosis. Flow confirmed in the hilum of the right kidney. No focal echogenic focus. Left Kidney: Length: 10.5 cm x 6.4 cm x 5.4 cm, 190 cc. Echogenicity of the left kidney relatively increased. No hydronephrosis. No focal echogenic focus. Flow confirmed in the hilum. Bladder: Bladder partially distended,  otherwise unremarkable IMPRESSION: Negative for hydronephrosis. Mildly increased echogenicity of the bilateral kidneys, indicating medical renal disease. Electronically Signed   By: Corrie Mckusick D.O.   On: 04/20/2019 11:16   DG Chest Portable 1 View  Result Date: 04/19/2019 CLINICAL DATA:  Hypoxia. EXAM: PORTABLE CHEST 1 VIEW COMPARISON:  December 09, 2013 FINDINGS: Mild hazy infiltrates are seen within the bilateral lung bases. There is no evidence of a pleural effusion or pneumothorax. The cardiac silhouette is mildly enlarged. The visualized skeletal structures are unremarkable. IMPRESSION: Mild hazy bibasilar infiltrates. Electronically Signed   By: Virgina Norfolk M.D.   On: 04/19/2019 18:54

## 2019-04-20 NOTE — Progress Notes (Signed)
  Echocardiogram 2D Echocardiogram has been performed.  Nathan Grant 04/20/2019, 3:21 PM

## 2019-04-20 NOTE — Progress Notes (Addendum)
TTE findings d/w cards-Dr Christopher-has a clot/thrombus in PA-along with mod RV dysfunction and severe pul HTN. Patient remains on 15L HFNC.   Patient has severe LVH and is morbidly obese-this may be causing some of his pul HTN-however given a evident clot in PA-and severe hypoxia-case d/w with PCCM-Dr Mcquaid-who recommends TPA. Transfer to ICU-will await formal recs from PCCM.  I have updated the patient and the spouse.

## 2019-04-21 ENCOUNTER — Inpatient Hospital Stay (HOSPITAL_COMMUNITY): Payer: Medicare HMO

## 2019-04-21 LAB — CBC WITH DIFFERENTIAL/PLATELET
Abs Immature Granulocytes: 0.87 10*3/uL — ABNORMAL HIGH (ref 0.00–0.07)
Basophils Absolute: 0 10*3/uL (ref 0.0–0.1)
Basophils Relative: 0 %
Eosinophils Absolute: 0 10*3/uL (ref 0.0–0.5)
Eosinophils Relative: 0 %
HCT: 28.4 % — ABNORMAL LOW (ref 39.0–52.0)
Hemoglobin: 9.1 g/dL — ABNORMAL LOW (ref 13.0–17.0)
Immature Granulocytes: 5 %
Lymphocytes Relative: 7 %
Lymphs Abs: 1.1 10*3/uL (ref 0.7–4.0)
MCH: 21.9 pg — ABNORMAL LOW (ref 26.0–34.0)
MCHC: 32 g/dL (ref 30.0–36.0)
MCV: 68.4 fL — ABNORMAL LOW (ref 80.0–100.0)
Monocytes Absolute: 0.5 10*3/uL (ref 0.1–1.0)
Monocytes Relative: 3 %
Neutro Abs: 13.6 10*3/uL — ABNORMAL HIGH (ref 1.7–7.7)
Neutrophils Relative %: 85 %
Platelets: 233 10*3/uL (ref 150–400)
RBC: 4.15 MIL/uL — ABNORMAL LOW (ref 4.22–5.81)
RDW: 17 % — ABNORMAL HIGH (ref 11.5–15.5)
WBC: 16.1 10*3/uL — ABNORMAL HIGH (ref 4.0–10.5)
nRBC: 1 % — ABNORMAL HIGH (ref 0.0–0.2)

## 2019-04-21 LAB — COMPREHENSIVE METABOLIC PANEL
ALT: 34 U/L (ref 0–44)
AST: 40 U/L (ref 15–41)
Albumin: 2.6 g/dL — ABNORMAL LOW (ref 3.5–5.0)
Alkaline Phosphatase: 56 U/L (ref 38–126)
Anion gap: 14 (ref 5–15)
BUN: 111 mg/dL — ABNORMAL HIGH (ref 8–23)
CO2: 18 mmol/L — ABNORMAL LOW (ref 22–32)
Calcium: 7.7 mg/dL — ABNORMAL LOW (ref 8.9–10.3)
Chloride: 103 mmol/L (ref 98–111)
Creatinine, Ser: 4.22 mg/dL — ABNORMAL HIGH (ref 0.61–1.24)
GFR calc Af Amer: 15 mL/min — ABNORMAL LOW (ref >60)
GFR calc non Af Amer: 13 mL/min — ABNORMAL LOW (ref >60)
Glucose, Bld: 142 mg/dL — ABNORMAL HIGH (ref 70–99)
Potassium: 5 mmol/L (ref 3.5–5.1)
Sodium: 135 mmol/L (ref 135–145)
Total Bilirubin: 0.7 mg/dL (ref 0.3–1.2)
Total Protein: 6.6 g/dL (ref 6.5–8.1)

## 2019-04-21 LAB — HEPARIN LEVEL (UNFRACTIONATED)
Heparin Unfractionated: 0.34 IU/mL (ref 0.30–0.70)
Heparin Unfractionated: 0.47 IU/mL (ref 0.30–0.70)

## 2019-04-21 LAB — GLUCOSE, CAPILLARY
Glucose-Capillary: 139 mg/dL — ABNORMAL HIGH (ref 70–99)
Glucose-Capillary: 177 mg/dL — ABNORMAL HIGH (ref 70–99)
Glucose-Capillary: 185 mg/dL — ABNORMAL HIGH (ref 70–99)
Glucose-Capillary: 176 mg/dL — ABNORMAL HIGH (ref 70–99)

## 2019-04-21 LAB — MRSA PCR SCREENING: MRSA by PCR: NEGATIVE

## 2019-04-21 LAB — C-REACTIVE PROTEIN: CRP: 14.1 mg/dL — ABNORMAL HIGH (ref <1.0)

## 2019-04-21 LAB — APTT: aPTT: 55 seconds — ABNORMAL HIGH (ref 24–36)

## 2019-04-21 LAB — D-DIMER, QUANTITATIVE: D-Dimer, Quant: >20 ug/mL-FEU — ABNORMAL HIGH (ref 0.00–0.50)

## 2019-04-21 MED ORDER — HEPARIN (PORCINE) 25000 UT/250ML-% IV SOLN
1700.0000 [IU]/h | INTRAVENOUS | Status: DC
  Administered 2019-04-21 (×3): 1700 [IU]/h via INTRAVENOUS
  Administered 2019-04-23: 1900 [IU]/h via INTRAVENOUS
  Filled 2019-04-21 (×4): qty 250

## 2019-04-21 MED ORDER — INSULIN ASPART 100 UNIT/ML ~~LOC~~ SOLN
0.0000 [IU] | Freq: Three times a day (TID) | SUBCUTANEOUS | Status: DC
  Administered 2019-04-21 – 2019-04-26 (×6): 2 [IU] via SUBCUTANEOUS
  Administered 2019-04-27: 17:00:00 1 [IU] via SUBCUTANEOUS
  Administered 2019-04-27: 12:00:00 2 [IU] via SUBCUTANEOUS

## 2019-04-21 NOTE — Progress Notes (Signed)
[place] for heparin Indication: pulmonary embolus, b/l DVTs  Allergies  Allergen Reactions  . Ibuprofen   . Tylenol [Acetaminophen]     Patient Measurements: Height: 6\' 1"  (185.4 cm) Weight: (!) 304 lb 14.3 oz (138.3 kg) IBW/kg (Calculated) : 79.9 Heparin Dosing Weight: 115kg (last wt 149.7kg on 03/07/19)  Vital Signs: Temp: 97.9 F (36.6 C) (01/08 1552) Temp Source: Oral (01/08 1552) BP: 144/73 (01/08 1552) Pulse Rate: 93 (01/08 1552)  Labs: Recent Labs    04/19/19 1820 04/19/19 1820 04/19/19 2003 04/19/19 2017 04/20/19 0300 04/20/19 0307 04/20/19 1508 04/20/19 2000 04/21/19 0340 04/21/19 0904 04/21/19 1730  HGB 9.6*  --   --   --  9.1*  --   --  9.9* 9.1*  --   --   HCT 30.0*  --   --   --  28.5*  --   --  30.7* 28.4*  --   --   PLT 176  --   --   --  167  --   --  283 233  --   --   APTT  --   --   --  38*  --   --   --  55*  --   --   --   LABPROT  --   --   --  19.4*  --   --   --  23.5*  --   --   --   INR  --   --   --  1.6*  --   --   --  2.1*  --   --   --   HEPARINUNFRC  --    < >  --   --   --  0.14* 0.59  --   --  0.34 0.47  CREATININE 4.19*  --   --   --   --  4.48*  --   --  4.22*  --   --   TROPONINIHS 164*  --  205*  --   --   --   --   --   --   --   --    < > = values in this interval not displayed.    Estimated Creatinine Clearance: 22.4 mL/min (A) (by C-G formula based on SCr of 4.22 mg/dL (H)).   Assessment: [name].  Pharmacy consulted to dose heparin for PE.  No prior AC  Heparin level therapeutic on 2250 units/hr 1/7 pm. Decision made to treat with lytic - TPA 100mg  given 1/7 2030.  Now on IV heparin at 1700 units/hr. Initial heparin level is therapeutic. H/H low stable, Plt wnl. No s/s of overt bleeding noted.   Hep level came back within modified range again tonight. We will monitor at standard range starting after 2230 tonight.    Goal of Therapy: Heparin level 0.3-0.7 units/ml  Monitor platelets by anticoagulation protocol   Plan:  Continue IV heparin at 1700 units/hr  Monitor daily HL, CBC and s/s of bleeding   Onnie Boer, PharmD, [place], AAHIVP, CPP Infectious Disease Pharmacist 04/21/2019 7:27 PM

## 2019-04-21 NOTE — Progress Notes (Signed)
[facility] PCCM  Overnight his oxygen needs have improved, he is making urine Seems to be doing better PCCM available PRN Agree with transfer to PCU  Roselie Awkward, MD [place] PCCM Pager: [phone] Cell: [phone] If no response, call [phone]

## 2019-04-21 NOTE — Progress Notes (Signed)
[facility] Progress Note   74 y.o. male with a history of CKD, HTN DM p/w dyspnea req  supplemental oxygen found  to have Covid PNA as well as concurrent bacterial PNA and  PE + DVT.  Rx TPA on 1/7 and on high  oxygen requirements. Patient had reported to the team that most of his edema was chronic.    Assessment/ Plan:   1)  Acute renal failure on CKF IIIb BL Cr 1.8-2 -Secondary to ATN from Covid however also with prerenal insults at home.  Appears to have been on lisinopril and losartan as well as spironolactone.  Renal US without hydro and mildly increased echogenicity - No acute indication for dialysis and fortunate renal function improved today; hopefully that represents a trend. - continue holding lisinopril, losartan, and spironolactone   2) COVID-19 pneumonia - Therapies per primary team  3)  Acute hypoxic respiratory failure - Supplemental oxygen - Treat Covid as above  4)  HTN  - controlled  5)  Chronic diastolic CHF - Lasix as needed  6)  Bilateral lower extremity DVT  - anticoagulation per primary team  Subjective:   Has appetite but breathing not improved per pt. Denies f/c/n/v.   Objective:   BP (!) 118/95   Pulse 91   Temp 98.2 F (36.8 C) (Oral)   Resp (!) 29   Ht 6\' 1"  (1.854 m)   Wt (!) 138.3 kg   SpO2 99%   BMI 40.23 kg/m   Intake/Output Summary (Last 24 hours) at 04/21/2019 8295 Last data filed at 04/21/2019 0730 Gross per 24 hour  Intake 1110.52 ml  Output 2250 ml  Net -1139.48 ml   Weight change:   Physical Exam: General: adult male in bed in NAD  HEENT:  NCAT  Heart: RRR Lungs: clear  Abdomen: soft and nondistended with obese habitus  Extremities: obese habitus with some chronic edema at least 1+, tender to palpation Neuro: alert and oriented, pleasant  Skin - no rash  Psych normal mood and affect   Imaging: US Renal  Result Date: 04/20/2019 CLINICAL DATA:  74 year old male with a history of acute kidney injury  EXAM: RENAL / URINARY TRACT ULTRASOUND COMPLETE COMPARISON:  None. FINDINGS: Right Kidney: Length: 11.7 cm x 4.8 cm x 6.1 cm, 181 cc. Echogenicity of the right renal cortex relatively increased. No hydronephrosis. Flow confirmed in the hilum of the right kidney. No focal echogenic focus. Left Kidney: Length: 10.5 cm x 6.4 cm x 5.4 cm, 190 cc. Echogenicity of the left kidney relatively increased. No hydronephrosis. No focal echogenic focus. Flow confirmed in the hilum. Bladder: Bladder partially distended, otherwise unremarkable IMPRESSION: Negative for hydronephrosis. Mildly increased echogenicity of the bilateral kidneys, indicating medical renal disease. Electronically Signed   By: Corrie Mckusick D.O.   On: 04/20/2019 11:16   DG Chest Portable 1 View  Result Date: 04/19/2019 CLINICAL DATA:  Hypoxia. EXAM: PORTABLE CHEST 1 VIEW COMPARISON:  December 09, 2013 FINDINGS: Mild hazy infiltrates are seen within the bilateral lung bases. There is no evidence of a pleural effusion or pneumothorax. The cardiac silhouette is mildly enlarged. The visualized skeletal structures are unremarkable. IMPRESSION: Mild hazy bibasilar infiltrates. Electronically Signed   By: Virgina Norfolk M.D.   On: 04/19/2019 18:54   ECHOCARDIOGRAM COMPLETE  Result Date: 04/20/2019   ECHOCARDIOGRAM REPORT   Patient Name:   Nathan Grant Date of Exam: 04/20/2019 Medical Rec #:  [id number]        Height:  73.0 in Accession #:    [id number]       Weight:       304.9 lb Date of Birth:  1945-12-02         BSA:          2.57 m Patient Age:    63 years         BP:           127/70 mmHg Patient Gender: M                HR:           86 bpm. Exam Location:  Inpatient Procedure: 2D Echo, Cardiac Doppler and Color Doppler Indications:    R06.02 SOB  History:        Patient has prior history of Echocardiogram examinations, most                 recent 05/29/2012. Abnormal ECG, Pulmonary HTN, Arrythmias:Atrial                 Flutter and NSVT,  Signs/Symptoms:Chest Pain and Dyspnea; Risk                 Factors:Sleep Apnea, Hypertension, Diabetes and Dyslipidemia.                 Covid 19 positive.  Sonographer:    Roseanna Rainbow RDCS Referring Phys: [place]  Sonographer Comments: Technically difficult study due to poor echo windows and patient is morbidly obese. Image acquisition challenging due to patient body habitus. IMPRESSIONS  1. Normal LVEF with severe pulmonary hypertension and likely PA thrombus in right PA, suggestive of pulmonary embolism. RV is only midly enlarged. Findings communicated with Dr. Sloan Leiter.  2. Left ventricular ejection fraction, by visual estimation, is 65 to 70%. The left ventricle has hyperdynamic function. There is severely increased left ventricular hypertrophy.  3. Right ventricular pressure overload.  4. The left ventricle has no regional wall motion abnormalities.  5. Global right ventricle has moderately reduced systolic function.The right ventricular size is mildly enlarged. Right vetricular wall thickness was not assessed.  6. Preserved RV apical function, consistent with McConnell's sign. RV size normal to only slightly enlarged depending on imaging window.  7. Left atrial size was mildly dilated.  8. Right atrial size was moderately dilated.  9. Small pericardial effusion. 10. The pericardial effusion is circumferential. 11. The mitral valve is normal in structure. Trivial mitral valve regurgitation. 12. The tricuspid valve is normal in structure. 13. The aortic valve is tricuspid. Aortic valve regurgitation is trivial. Mild aortic valve sclerosis without stenosis. 14. Pulmonic regurgitation is moderate. 15. The pulmonic valve was grossly normal. Pulmonic valve regurgitation is moderate. 16. Severely elevated pulmonary artery systolic pressure. 36. Suspect thromboembolism in the main pulmonary artery. 18. There is a mobile mass seen in the right PA highly suspicious for thrombus. Additional thrombus burden  not excluded. 19. The tricuspid regurgitant velocity is 3.74 m/s, and with an assumed right atrial pressure of 15 mmHg, the estimated right ventricular systolic pressure is severely elevated at 70.8 mmHg. 20. The inferior vena cava is dilated in size with <50% respiratory variability, suggesting right atrial pressure of 15 mmHg. FINDINGS  Left Ventricle: Left ventricular ejection fraction, by visual estimation, is 65 to 70%. The left ventricle has hyperdynamic function. The left ventricle has no regional wall motion abnormalities. There is severely increased left ventricular hypertrophy.  Concentric left ventricular hypertrophy. The interventricular septum is flattened  in systole, consistent with right ventricular pressure overload. Right Ventricle: The right ventricular size is mildly enlarged. Right vetricular wall thickness was not assessed. Global RV systolic function is has moderately reduced systolic function. The tricuspid regurgitant velocity is 3.74 m/s, and with an assumed  right atrial pressure of 15 mmHg, the estimated right ventricular systolic pressure is severely elevated at 70.8 mmHg. Preserved RV apical function, consistent with McConnell's sign. RV size normal to only slightly enlarged depending on imaging window. Left Atrium: Left atrial size was mildly dilated. Right Atrium: Right atrial size was moderately dilated Pericardium: A small pericardial effusion is present. The pericardial effusion is circumferential. Mitral Valve: The mitral valve is normal in structure. Trivial mitral valve regurgitation. Tricuspid Valve: The tricuspid valve is normal in structure. Tricuspid valve regurgitation moderate. Aortic Valve: The aortic valve is tricuspid. . There is mild thickening and mild calcification of the aortic valve. Aortic valve regurgitation is trivial. Mild aortic valve sclerosis is present, with no evidence of aortic valve stenosis. There is mild thickening of the aortic valve. There is mild  calcification of the aortic valve. Pulmonic Valve: The pulmonic valve was grossly normal. Pulmonic valve regurgitation is moderate. Pulmonic regurgitation is moderate. Aorta: The aortic root, ascending aorta and aortic arch are all structurally normal, with no evidence of dilitation or obstruction. Pulmonary Artery: Suspect thromboembolism in the main pulmonary artery. There is a mobile mass seen in the right PA highly suspicious for thrombus. Additional thrombus burden not excluded. Venous: The inferior vena cava is dilated in size with less than 50% respiratory variability, suggesting right atrial pressure of 15 mmHg. IAS/Shunts: No atrial level shunt detected by color flow Doppler.  LEFT VENTRICLE PLAX 2D LVIDd:         4.29 cm LVIDs:         2.34 cm LV PW:         2.08 cm LV IVS:        2.18 cm LVOT diam:     2.00 cm LV SV:         64 ml LV SV Index:   23.37 LVOT Area:     3.14 cm  LV Volumes (MOD) LV area d, A2C:    28.70 cm LV area d, A4C:    22.30 cm LV area s, A2C:    15.20 cm LV area s, A4C:    12.20 cm LV major d, A2C:   6.53 cm LV major d, A4C:   7.35 cm LV major s, A2C:   5.76 cm LV major s, A4C:   5.75 cm LV vol d, MOD A2C: 104.0 ml LV vol d, MOD A4C: 59.9 ml LV vol s, MOD A2C: 34.7 ml LV vol s, MOD A4C: 23.8 ml LV SV MOD A2C:     69.3 ml LV SV MOD A4C:     59.9 ml LV SV MOD BP:      54.1 ml RIGHT VENTRICLE         IVC TAPSE (M-mode): 1.2 cm  IVC diam: 2.23 cm LEFT ATRIUM             Index       RIGHT ATRIUM           Index LA diam:        4.40 cm 1.71 cm/m  RA Area:     29.40 cm LA Vol (A2C):   93.0 ml 36.13 ml/m RA Volume:   108.00 ml 41.96 ml/m LA Vol (A4C):  77.6 ml 30.15 ml/m LA Biplane Vol: 94.3 ml 36.64 ml/m  AORTIC VALVE             PULMONIC VALVE LVOT Vmax:   149.00 cm/s PR End Diast Vel: 2.12 msec LVOT Vmean:  95.400 cm/s LVOT VTI:    0.197 m  AORTA Ao Asc diam: 3.20 cm MITRAL VALVE                        TRICUSPID VALVE MV Area (PHT): 4.78 cm             TR Peak grad:   55.8 mmHg  MV PHT:        46.01 msec           TR Vmax:        387.00 cm/s MV Decel Time: 159 msec MV E velocity: 103.53 cm/s 103 cm/s SHUNTS                                     Systemic VTI:  0.20 m                                     Systemic Diam: 2.00 cm  Buford Dresser MD Electronically signed by Buford Dresser MD Signature Date/Time: 04/20/2019/6:30:29 PM    Final    VAS [place] LOWER EXTREMITY VENOUS (DVT)  Result Date: 04/20/2019  Lower Venous Study Indications: Covid-19, elevated D-Dimer.  Limitations: Body habitus. Comparison Study: No prior study on file Performing Technologist: Sharion Dove RVS  Examination Guidelines: A complete evaluation includes B-mode imaging, spectral Doppler, color Doppler, and power Doppler as needed of all accessible portions of each vessel. Bilateral testing is considered an integral part of a complete examination. Limited examinations for reoccurring indications may be performed as noted.  +---------+---------------+---------+-----------+----------+--------------+ RIGHT    CompressibilityPhasicitySpontaneityPropertiesThrombus Aging +---------+---------------+---------+-----------+----------+--------------+ CFV      Full           Yes      Yes                                 +---------+---------------+---------+-----------+----------+--------------+ SFJ      Full                                                        +---------+---------------+---------+-----------+----------+--------------+ FV Prox  Full                                                        +---------+---------------+---------+-----------+----------+--------------+ FV Mid   Full                                                        +---------+---------------+---------+-----------+----------+--------------+ FV DistalFull                                                        +---------+---------------+---------+-----------+----------+--------------+  PFV      Full                                                         +---------+---------------+---------+-----------+----------+--------------+ POP      Partial                                      Acute          +---------+---------------+---------+-----------+----------+--------------+ PTV      None                                         Acute          +---------+---------------+---------+-----------+----------+--------------+ PERO                                                  Not visualized +---------+---------------+---------+-----------+----------+--------------+ Gastroc  None                                         Acute          +---------+---------------+---------+-----------+----------+--------------+   +---------+---------------+---------+-----------+----------+--------------+ LEFT     CompressibilityPhasicitySpontaneityPropertiesThrombus Aging +---------+---------------+---------+-----------+----------+--------------+ CFV      Full           Yes      Yes                                 +---------+---------------+---------+-----------+----------+--------------+ SFJ      Full                                                        +---------+---------------+---------+-----------+----------+--------------+ FV Prox  Full                                                        +---------+---------------+---------+-----------+----------+--------------+ FV Mid   Full                                                        +---------+---------------+---------+-----------+----------+--------------+ FV DistalFull                                                        +---------+---------------+---------+-----------+----------+--------------+ PFV  Full                                                        +---------+---------------+---------+-----------+----------+--------------+ POP      None                                         Acute           +---------+---------------+---------+-----------+----------+--------------+ PTV      None                                         Acute          +---------+---------------+---------+-----------+----------+--------------+ PERO                                                  Not visualized +---------+---------------+---------+-----------+----------+--------------+ Gastroc  None                                         Acute          +---------+---------------+---------+-----------+----------+--------------+     Summary: Right: Findings consistent with acute deep vein thrombosis involving the right popliteal vein, right posterior tibial veins, and right gastrocnemius veins. Left: Findings consistent with acute deep vein thrombosis involving the left popliteal vein, left posterior tibial veins, and left gastrocnemius veins.  *See table(s) above for measurements and observations. Electronically signed by Monica Martinez MD on 04/20/2019 at 5:10:07 PM.    Final     Labs: BMET Recent Labs  Lab 04/19/19 1820 04/20/19 0307 04/21/19 0340  NA 134* 134* 135  K 5.2* 5.3* 5.0  CL 101 102 103  CO2 19* 17* 18*  GLUCOSE 176* 123* 142*  BUN 77* 88* 111*  CREATININE 4.19* 4.48* 4.22*  CALCIUM 7.9* 7.8* 7.7*   CBC Recent Labs  Lab 04/19/19 1820 04/20/19 0300 04/20/19 2000 04/21/19 0340  WBC 12.2* 11.8* 17.3* 16.1*  NEUTROABS 10.0* 9.7*  --  13.6*  HGB 9.6* 9.1* 9.9* 9.1*  HCT 30.0* 28.5* 30.7* 28.4*  MCV 70.4* 69.0* 69.0* 68.4*  PLT 176 167 283 233    Medications:    . vitamin C  500 mg Oral Daily  . Chlorhexidine Gluconate Cloth  6 each Topical Daily  . methylPREDNISolone (SOLU-MEDROL) injection  60 mg Intravenous Q12H  . pantoprazole  40 mg Oral Daily  . zinc sulfate  220 mg Oral Daily      Otelia Santee, MD 04/21/2019, 9:38 AM

## 2019-04-21 NOTE — Progress Notes (Signed)
Report called to [place], RN at this time.

## 2019-04-21 NOTE — Progress Notes (Signed)
[place] for heparin Indication: pulmonary embolus, b/l DVTs  Allergies  Allergen Reactions  . Ibuprofen   . Tylenol [Acetaminophen]     Patient Measurements: Height: 6\' 1"  (185.4 cm) Weight: (!) 304 lb 14.3 oz (138.3 kg) IBW/kg (Calculated) : 79.9 Heparin Dosing Weight: 115kg (last wt 149.7kg on 03/07/19)  Vital Signs: Temp: 98.1 F (36.7 C) (01/08 0000) Temp Source: Axillary (01/08 0000) BP: 144/90 (01/08 0015) Pulse Rate: 92 (01/08 0015)  Labs: Recent Labs    04/19/19 1820 04/19/19 2003 04/19/19 2017 04/20/19 0300 04/20/19 0307 04/20/19 1508 04/20/19 2000  HGB 9.6*  --   --  9.1*  --   --  9.9*  HCT 30.0*  --   --  28.5*  --   --  30.7*  PLT 176  --   --  167  --   --  283  APTT  --   --  38*  --   --   --  55*  LABPROT  --   --  19.4*  --   --   --  23.5*  INR  --   --  1.6*  --   --   --  2.1*  HEPARINUNFRC  --   --   --   --  0.14* 0.59  --   CREATININE 4.19*  --   --   --  4.48*  --   --   TROPONINIHS 164* 205*  --   --   --   --   --     Estimated Creatinine Clearance: 21.1 mL/min (A) (by C-G formula based on SCr of 4.48 mg/dL (H)).   Assessment: [name].  Pharmacy consulted to dose heparin for PE.  No prior AC  Heparin level therapeutic on 2250 units/hr 1/7 pm. Decision made to treat with lytic - TPA 100mg  given 1/7 2030.  Post TPA PTT = 55 sec so will restart heparin. INR 2.1 - TPA likely affecting. Hgb stable at 9.9.  Goal of Therapy: Heparin level 0.3-0.7 units/ml (goal 0.3-0.5 for first 24 hrs post TPA so until 1/8 2230) Monitor platelets by anticoagulation protocol   Plan:  Restart heparin at 1700 units/hr - will likely need increase but will be cautious post TPA F/u 8 hr heparin level Daily heparin level and CBC  Sherlon Handing, PharmD, BCPS Please see amion for complete clinical pharmacist phone list 04/21/2019 12:33 AM

## 2019-04-21 NOTE — Progress Notes (Signed)
Patient belonging at bedside include: Pair of shoes, pajama pants and shirt. Pt also has silver ring on left ring finger that he wishes to remain on at this time.

## 2019-04-21 NOTE — Progress Notes (Signed)
[place] for heparin Indication: pulmonary embolus, b/l DVTs  Allergies  Allergen Reactions  . Ibuprofen   . Tylenol [Acetaminophen]     Patient Measurements: Height: 6\' 1"  (185.4 cm) Weight: (!) 304 lb 14.3 oz (138.3 kg) IBW/kg (Calculated) : 79.9 Heparin Dosing Weight: 115kg (last wt 149.7kg on 03/07/19)  Vital Signs: Temp: 99.3 F (37.4 C) (01/08 1200) Temp Source: Oral (01/08 1200) BP: 143/114 (01/08 1230) Pulse Rate: 106 (01/08 1230)  Labs: Recent Labs    04/19/19 1820 04/19/19 2003 04/19/19 2017 04/20/19 0300 04/20/19 0307 04/20/19 1508 04/20/19 2000 04/21/19 0340 04/21/19 0904  HGB 9.6*  --   --  9.1*  --   --  9.9* 9.1*  --   HCT 30.0*  --   --  28.5*  --   --  30.7* 28.4*  --   PLT 176  --   --  167  --   --  283 233  --   APTT  --   --  38*  --   --   --  55*  --   --   LABPROT  --   --  19.4*  --   --   --  23.5*  --   --   INR  --   --  1.6*  --   --   --  2.1*  --   --   HEPARINUNFRC  --   --   --   --  0.14* 0.59  --   --  0.34  CREATININE 4.19*  --   --   --  4.48*  --   --  4.22*  --   TROPONINIHS 164* 205*  --   --   --   --   --   --   --     Estimated Creatinine Clearance: 22.4 mL/min (A) (by C-G formula based on SCr of 4.22 mg/dL (H)).   Assessment: [name].  Pharmacy consulted to dose heparin for PE.  No prior AC  Heparin level therapeutic on 2250 units/hr 1/7 pm. Decision made to treat with lytic - TPA 100mg  given 1/7 2030.  Now on IV heparin at 1700 units/hr. Initial heparin level is therapeutic. H/H low stable, Plt wnl. No s/s of overt bleeding noted.   Goal of Therapy: Heparin level 0.3-0.7 units/ml (goal 0.3-0.5 for first 24 hrs post TPA so until 1/8 2230) Monitor platelets by anticoagulation protocol   Plan:  Continue IV heparin at 1700 units/hr  F/u 8 hr HL Monitor daily HL, CBC and s/s of bleeding   Albertina Parr, PharmD.,  BCPS Clinical Pharmacist Clinical phone for 04/20/18 until 5pm: [phone]

## 2019-04-21 NOTE — Progress Notes (Signed)
Son, Terrall Laity, called for update. Updated on pt condition. All nursing questions answered.

## 2019-04-21 NOTE — Progress Notes (Addendum)
PROGRESS NOTE                                                                                                                                                                                                             Patient Demographics:    Nathan Grant, is a 74 y.o. male, DOB - [birth year], GUR:[id number]  Outpatient Primary MD for the patient is Nathan Moron, MD   Admit date - 04/19/2019   LOS - 2  Chief Complaint  Patient presents with   Respiratory Distress       Brief Narrative: Patient is a 74 y.o. male with PMHx of HTN, DM-2, CKD stage IIIb, history of PAF-presented to the ED with 3-day history of shortness of breath-found to have acute hypoxic respiratory failures secondary to COVID-19 pneumonia along with AKI.  Patient was subsequently admitted to the hospitalist service at [facility].   Subjective:    Nathan Grant is awake-alert.  His oxygen requirements are markedly better today.  Seems to have tolerated TPA last night without any major adverse events.   Assessment  & Plan :   Acute Hypoxic Resp Failure due to pulmonary embolism with RV strain and Covid 19 with concurrent bacterial pneumonia: Oxygenation has improved following TPA infusion last night-he is down to 10 L of oxygen.  Continue with steroids/remdesivir and empiric Rocephin/Zithromax for Covid and concurrent bacterial pneumonia.  CRP downtrending.  Transfer back to PCU today.  Fever: afebrile  O2 requirements:  SpO2: 99 % O2 Flow Rate (L/min): 10 L/min   COVID-19 Labs: Recent Labs    04/19/19 1820 04/19/19 2003 04/19/19 2019 04/20/19 0307 04/21/19 0340  DDIMER >20.00*  --   --  >20.00* >20.00*  LDH  --  761*  --   --   --   CRP  --   --  19.7* 18.8* 14.1*       Component Value Date/Time   BNP 413.0 (H) 04/19/2019 1820    Recent Labs  Lab 04/19/19 2003 04/20/19 0307  PROCALCITON 0.96 2.16    Lab Results   Component Value Date   SARSCOV2NAA Detected (A) 04/12/2019     COVID-19 Medications: Steroids: 1/6>> Remdesivir:1/6>>  Other medications: Antibiotics: Rocephin: 1/7>> Zithromax: 1/7>>  Prone/Incentive Spirometry: encouraged patient to lie prone for 3-4 hours at a time for a total of  16 hours a day, and to encourage incentive spirometry use 3-4/hour.  DVT Prophylaxis  :   Heparin gtt  AKI on CKD stage IIIb: Suspect this is hemodynamically mediated in the setting of acute infection with COVID-19 along with the use of Aldactone and losartan.  UA without proteinuria, renal ultrasound negative for hydronephrosis.  Making urine-creatinine is trending down-however BUN is elevated today-but patient on steroids-did receive Lasix as well.  Electrolytes are relatively stable-he is awake and alert.  No indication for RRT at this point.  His volume status is stable-he has very minimal edema in his lower extremities today.  Appreciate nephrology input-agree-we just need watchful waiting for today-hopefully renal function will continue to improve.  Pulmonary embolism with RV strain and bilateral lower extremity Doppler: Due to severe hypoxemia-underwent TPA infusion last night-now back on IV heparin.  Note echo on 1/7 demonstrated a pulmonary embolus (clot seen in PA) with severe pulmonary hypertension.  Plans are to continue with IV heparin-until we are sure that we do not need to put in a HD catheter.  When we get closer to discharge-if renal function has not improved-then will need to be started on Coumadin.  HTN: Blood pressure remains controlled without the use of any antihypertensives-he is on multiple antihypertensives at home.  Will monitor closely and resume antihypertensives when able.    DM-2 (A1c 6.4): Continue close monitoring with SSI-follow closely.  CBG (last 3)  Recent Labs    04/21/19 0815  GLUCAP 139*    Chronic diastolic heart failure: Remains compensated-volume status better  following 1 dose of IV Lasix yesterday.  I do not think he has decompensated heart failure at this point.    PAF: Currently in sinus rhythm-anticoagulated for VTE.  Obesity: Estimated body mass index is 40.23 kg/m as calculated from the following:   Height as of this encounter: 6\' 1"  (1.854 m).   Weight as of this encounter: 138.3 kg.    Consults  : Nephrology  Procedures  :  None  ABG:    Component Value Date/Time   TCO2 28 10/03/2017 1025    Vent Settings: N/A  Condition - Extremely Guarded  Family Communication  : Called spouse-unable to leave a voicemail today.  Code Status :  Full Code  Diet :  Diet Order            Diet Heart Room service appropriate? Yes; Fluid consistency: Thin  Diet effective now               Disposition Plan  :  Remain hospitalized  Barriers to discharge: Hypoxia requiring O2 supplementation/complete 5 days of IV Remdesivir  Antimicorbials  :    Anti-infectives (From admission, onward)   Start     Dose/Rate Route Frequency Ordered Stop   04/21/19 1600  remdesivir 100 mg in sodium chloride 0.9 % 100 mL IVPB     100 mg 200 mL/hr over 30 Minutes Intravenous Daily 04/20/19 1658 04/24/19 0959   04/21/19 1000  remdesivir 100 mg in sodium chloride 0.9 % 100 mL IVPB  Status:  Discontinued     100 mg 200 mL/hr over 30 Minutes Intravenous Daily 04/20/19 0525 04/20/19 1658   04/20/19 2000  remdesivir 100 mg in sodium chloride 0.9 % 100 mL IVPB     100 mg 200 mL/hr over 30 Minutes Intravenous  Once 04/20/19 1658 04/20/19 2026   04/20/19 1400  azithromycin (ZITHROMAX) 500 mg in sodium chloride 0.9 % 250 mL IVPB  500 mg 250 mL/hr over 60 Minutes Intravenous Every 24 hours 04/20/19 1308     04/20/19 1330  cefTRIAXone (ROCEPHIN) 1 g in sodium chloride 0.9 % 100 mL IVPB     1 g 200 mL/hr over 30 Minutes Intravenous Every 24 hours 04/20/19 1308 04/25/19 1329   04/20/19 1000  remdesivir 100 mg in sodium chloride 0.9 % 100 mL IVPB  Status:   Discontinued     100 mg 200 mL/hr over 30 Minutes Intravenous Daily 04/19/19 2015 04/20/19 0525   04/19/19 2200  remdesivir 200 mg in sodium chloride 0.9% 250 mL IVPB     200 mg 580 mL/hr over 30 Minutes Intravenous Once 04/19/19 2015 04/20/19 0302      Inpatient Medications  Scheduled Meds:  vitamin C  500 mg Oral Daily   Chlorhexidine Gluconate Cloth  6 each Topical Daily   methylPREDNISolone (SOLU-MEDROL) injection  60 mg Intravenous Q12H   pantoprazole  40 mg Oral Daily   zinc sulfate  220 mg Oral Daily   Continuous Infusions:  azithromycin 500 mg (04/20/19 1514)   cefTRIAXone (ROCEPHIN)  IV 1 g (04/20/19 1435)   heparin 1,700 Units/hr (04/21/19 0600)   remdesivir 100 mg in NS 100 mL     PRN Meds:.albuterol, hydrALAZINE, ondansetron **OR** ondansetron (ZOFRAN) IV, polyethylene glycol   Time Spent in minutes 35  See all Orders from today for further details   Oren Binet M.D on 04/21/2019 at 9:48 AM  To page go to [email or web address] - use universal password  [facility] -  Office  [phone]    Objective:   Vitals:   04/21/19 0700 04/21/19 0715 04/21/19 0730 04/21/19 0745  BP: (!) 147/87 (!) 134/102 (!) 146/92 (!) 118/95  Pulse: 75 82 91 91  Resp: (!) 25 (!) 21 (!) 24 (!) 29  Temp:   98.2 F (36.8 C)   TempSrc:   Oral   SpO2: 99% 98% 98% 99%  Weight:      Height:        Wt Readings from Last 3 Encounters:  04/19/19 (!) 138.3 kg  03/07/19 (!) 149.7 kg  01/27/19 (!) 147.4 kg     Intake/Output Summary (Last 24 hours) at 04/21/2019 0948 Last data filed at 04/21/2019 0730 Gross per 24 hour  Intake 1110.52 ml  Output 2250 ml  Net -1139.48 ml     Physical Exam Gen Exam:Alert awake-not in any distress HEENT:atraumatic, normocephalic Chest: B/L clear to auscultation anteriorly CVS:S1S2 regular Abdomen:soft non tender, non distended Extremities:+ edema Neurology: Non focal Skin: no rash   Data Review:    CBC Recent Labs  Lab  04/19/19 1820 04/20/19 0300 04/20/19 2000 04/21/19 0340  WBC 12.2* 11.8* 17.3* 16.1*  HGB 9.6* 9.1* 9.9* 9.1*  HCT 30.0* 28.5* 30.7* 28.4*  PLT 176 167 283 233  MCV 70.4* 69.0* 69.0* 68.4*  MCH 22.5* 22.0* 22.2* 21.9*  MCHC 32.0 31.9 32.2 32.0  RDW 16.4* 17.0* 16.9* 17.0*  LYMPHSABS 1.0 1.1  --  1.1  MONOABS 0.6 0.4  --  0.5  EOSABS 0.0 0.0  --  0.0  BASOSABS 0.0 0.0  --  0.0    Chemistries  Recent Labs  Lab 04/19/19 1820 04/20/19 0307 04/21/19 0340  NA 134* 134* 135  K 5.2* 5.3* 5.0  CL 101 102 103  CO2 19* 17* 18*  GLUCOSE 176* 123* 142*  BUN 77* 88* 111*  CREATININE 4.19* 4.48* 4.22*  CALCIUM 7.9* 7.8* 7.7*  AST 56* 73* 40  ALT 33 40 34  ALKPHOS 58 62 56  BILITOT 1.4* 1.3* 0.7   ------------------------------------------------------------------------------------------------------------------ No results for input(s): CHOL, HDL, LDLCALC, TRIG, CHOLHDL, LDLDIRECT in the last 72 hours.  Lab Results  Component Value Date   HGBA1C 6.4 (A) 03/07/2019   ------------------------------------------------------------------------------------------------------------------ No results for input(s): TSH, T4TOTAL, T3FREE, THYROIDAB in the last 72 hours.  Invalid input(s): FREET3 ------------------------------------------------------------------------------------------------------------------ No results for input(s): VITAMINB12, FOLATE, FERRITIN, TIBC, IRON, RETICCTPCT in the last 72 hours.  Coagulation profile Recent Labs  Lab 04/19/19 2017 04/20/19 2000  INR 1.6* 2.1*    Recent Labs    04/20/19 0307 04/21/19 0340  DDIMER >20.00* >20.00*    Cardiac Enzymes No results for input(s): CKMB, TROPONINI, MYOGLOBIN in the last 168 hours.  Invalid input(s): CK ------------------------------------------------------------------------------------------------------------------    Component Value Date/Time   BNP 413.0 (H) 04/19/2019 1820    Micro Results Recent  Results (from the past 240 hour(s))  Novel Coronavirus, NAA ([facility])     Status: Abnormal   Collection Time: 04/12/19 10:58 AM   Specimen: Nasopharyngeal(NP) swabs in vial transport medium   NASOPHARYNGE  TESTING  Result Value Ref Range Status   SARS-CoV-2, NAA Detected (A) Not Detected Final    Comment: Testing was performed using the cobas(R) SARS-CoV-2 test. This nucleic acid amplification test was developed and its performance characteristics determined by [employer]. Nucleic acid amplification tests include PCR and TMA. This test has not been FDA cleared or approved. This test has been authorized by FDA under an Emergency Use Authorization (EUA). This test is only authorized for the duration of time the declaration that circumstances exist justifying the authorization of the emergency use of in vitro diagnostic tests for detection of SARS-CoV-2 virus and/or diagnosis of COVID-19 infection under section 564(b)(1) of the Act, 21 U.S.C. 858IFO-2(D) (1), unless the authorization is terminated or revoked sooner. When diagnostic testing is negative, the possibility of a false negative result should be considered in the context of a patient's recent exposures and the presence of clinical signs and symptoms consistent with COVID-19. An individual without symptoms  of COVID-19 and who is not shedding SARS-CoV-2 virus would expect to have a negative (not detected) result in this assay.   Blood culture (routine x 2)     Status: None (Preliminary result)   Collection Time: 04/19/19  6:20 PM   Specimen: BLOOD  Result Value Ref Range Status   Specimen Description   Final    BLOOD RIGHT ANTECUBITAL Performed at [facility] [address], [place], [place] [address]    Special Requests   Final    BOTTLES DRAWN AEROBIC ONLY Blood Culture adequate volume   Culture   Final    NO GROWTH 2 DAYS Performed at [facility], [place] [address] [place], [place]  [address]    Report Status PENDING  Incomplete  Blood culture (routine x 2)     Status: None (Preliminary result)   Collection Time: 04/19/19  6:35 PM   Specimen: BLOOD  Result Value Ref Range Status   Specimen Description   Final    BLOOD LEFT ANTECUBITAL Performed at [facility] [address], [place], [place] [address]    Special Requests   Final    BOTTLES DRAWN AEROBIC ONLY Blood Culture adequate volume   Culture   Final    NO GROWTH 2 DAYS Performed at [facility], [place] [address] [place], [place] [address]    Report Status  PENDING  Incomplete  MRSA PCR Screening     Status: None   Collection Time: 04/21/19  4:24 AM   Specimen: Nasal Mucosa; Nasopharyngeal  Result Value Ref Range Status   MRSA by PCR NEGATIVE NEGATIVE Final    Comment:        The GeneXpert MRSA Assay (FDA approved for NASAL specimens only), is one component of a comprehensive MRSA colonization surveillance program. It is not intended to diagnose MRSA infection nor to guide or monitor treatment for MRSA infections. Performed at [facility], [place] [address] [place], [place] [address]     Radiology Reports US Renal  Result Date: 04/20/2019 CLINICAL DATA:  74 year old male with a history of acute kidney injury EXAM: RENAL / URINARY TRACT ULTRASOUND COMPLETE COMPARISON:  None. FINDINGS: Right Kidney: Length: 11.7 cm x 4.8 cm x 6.1 cm, 181 cc. Echogenicity of the right renal cortex relatively increased. No hydronephrosis. Flow confirmed in the hilum of the right kidney. No focal echogenic focus. Left Kidney: Length: 10.5 cm x 6.4 cm x 5.4 cm, 190 cc. Echogenicity of the left kidney relatively increased. No hydronephrosis. No focal echogenic focus. Flow confirmed in the hilum. Bladder: Bladder partially distended, otherwise unremarkable IMPRESSION: Negative for hydronephrosis. Mildly increased echogenicity of the bilateral kidneys, indicating medical renal disease.  Electronically Signed   By: Corrie Mckusick D.O.   On: 04/20/2019 11:16   DG Chest Portable 1 View  Result Date: 04/19/2019 CLINICAL DATA:  Hypoxia. EXAM: PORTABLE CHEST 1 VIEW COMPARISON:  December 09, 2013 FINDINGS: Mild hazy infiltrates are seen within the bilateral lung bases. There is no evidence of a pleural effusion or pneumothorax. The cardiac silhouette is mildly enlarged. The visualized skeletal structures are unremarkable. IMPRESSION: Mild hazy bibasilar infiltrates. Electronically Signed   By: Virgina Norfolk M.D.   On: 04/19/2019 18:54   ECHOCARDIOGRAM COMPLETE  Result Date: 04/20/2019   ECHOCARDIOGRAM REPORT   Patient Name:   Nathan Grant Date of Exam: 04/20/2019 Medical Rec #:  [id number]        Height:       73.0 in Accession #:    [id number]       Weight:       304.9 lb Date of Birth:  07-05-1945         BSA:          2.57 m Patient Age:    3 years         BP:           127/70 mmHg Patient Gender: M                HR:           86 bpm. Exam Location:  Inpatient Procedure: 2D Echo, Cardiac Doppler and Color Doppler Indications:    R06.02 SOB  History:        Patient has prior history of Echocardiogram examinations, most                 recent 05/29/2012. Abnormal ECG, Pulmonary HTN, Arrythmias:Atrial                 Flutter and NSVT, Signs/Symptoms:Chest Pain and Dyspnea; Risk                 Factors:Sleep Apnea, Hypertension, Diabetes and Dyslipidemia.                 Covid 19 positive.  Sonographer:    Roseanna Rainbow RDCS Referring Phys:  [place] Comments: Technically difficult study due to poor echo windows and patient is morbidly obese. Image acquisition challenging due to patient body habitus. IMPRESSIONS  1. Normal LVEF with severe pulmonary hypertension and likely PA thrombus in right PA, suggestive of pulmonary embolism. RV is only midly enlarged. Findings communicated with Dr. Sloan Leiter.  2. Left ventricular ejection fraction, by visual estimation, is 65 to 70%. The  left ventricle has hyperdynamic function. There is severely increased left ventricular hypertrophy.  3. Right ventricular pressure overload.  4. The left ventricle has no regional wall motion abnormalities.  5. Global right ventricle has moderately reduced systolic function.The right ventricular size is mildly enlarged. Right vetricular wall thickness was not assessed.  6. Preserved RV apical function, consistent with McConnell's sign. RV size normal to only slightly enlarged depending on imaging window.  7. Left atrial size was mildly dilated.  8. Right atrial size was moderately dilated.  9. Small pericardial effusion. 10. The pericardial effusion is circumferential. 11. The mitral valve is normal in structure. Trivial mitral valve regurgitation. 12. The tricuspid valve is normal in structure. 13. The aortic valve is tricuspid. Aortic valve regurgitation is trivial. Mild aortic valve sclerosis without stenosis. 14. Pulmonic regurgitation is moderate. 15. The pulmonic valve was grossly normal. Pulmonic valve regurgitation is moderate. 16. Severely elevated pulmonary artery systolic pressure. 69. Suspect thromboembolism in the main pulmonary artery. 18. There is a mobile mass seen in the right PA highly suspicious for thrombus. Additional thrombus burden not excluded. 19. The tricuspid regurgitant velocity is 3.74 m/s, and with an assumed right atrial pressure of 15 mmHg, the estimated right ventricular systolic pressure is severely elevated at 70.8 mmHg. 20. The inferior vena cava is dilated in size with <50% respiratory variability, suggesting right atrial pressure of 15 mmHg. FINDINGS  Left Ventricle: Left ventricular ejection fraction, by visual estimation, is 65 to 70%. The left ventricle has hyperdynamic function. The left ventricle has no regional wall motion abnormalities. There is severely increased left ventricular hypertrophy.  Concentric left ventricular hypertrophy. The interventricular septum is  flattened in systole, consistent with right ventricular pressure overload. Right Ventricle: The right ventricular size is mildly enlarged. Right vetricular wall thickness was not assessed. Global RV systolic function is has moderately reduced systolic function. The tricuspid regurgitant velocity is 3.74 m/s, and with an assumed  right atrial pressure of 15 mmHg, the estimated right ventricular systolic pressure is severely elevated at 70.8 mmHg. Preserved RV apical function, consistent with McConnell's sign. RV size normal to only slightly enlarged depending on imaging window. Left Atrium: Left atrial size was mildly dilated. Right Atrium: Right atrial size was moderately dilated Pericardium: A small pericardial effusion is present. The pericardial effusion is circumferential. Mitral Valve: The mitral valve is normal in structure. Trivial mitral valve regurgitation. Tricuspid Valve: The tricuspid valve is normal in structure. Tricuspid valve regurgitation moderate. Aortic Valve: The aortic valve is tricuspid. . There is mild thickening and mild calcification of the aortic valve. Aortic valve regurgitation is trivial. Mild aortic valve sclerosis is present, with no evidence of aortic valve stenosis. There is mild thickening of the aortic valve. There is mild calcification of the aortic valve. Pulmonic Valve: The pulmonic valve was grossly normal. Pulmonic valve regurgitation is moderate. Pulmonic regurgitation is moderate. Aorta: The aortic root, ascending aorta and aortic arch are all structurally normal, with no evidence of dilitation or obstruction. Pulmonary Artery: Suspect thromboembolism in the main pulmonary artery. There is a mobile  mass seen in the right PA highly suspicious for thrombus. Additional thrombus burden not excluded. Venous: The inferior vena cava is dilated in size with less than 50% respiratory variability, suggesting right atrial pressure of 15 mmHg. IAS/Shunts: No atrial level shunt detected  by color flow Doppler.  LEFT VENTRICLE PLAX 2D LVIDd:         4.29 cm LVIDs:         2.34 cm LV PW:         2.08 cm LV IVS:        2.18 cm LVOT diam:     2.00 cm LV SV:         64 ml LV SV Index:   23.37 LVOT Area:     3.14 cm  LV Volumes (MOD) LV area d, A2C:    28.70 cm LV area d, A4C:    22.30 cm LV area s, A2C:    15.20 cm LV area s, A4C:    12.20 cm LV major d, A2C:   6.53 cm LV major d, A4C:   7.35 cm LV major s, A2C:   5.76 cm LV major s, A4C:   5.75 cm LV vol d, MOD A2C: 104.0 ml LV vol d, MOD A4C: 59.9 ml LV vol s, MOD A2C: 34.7 ml LV vol s, MOD A4C: 23.8 ml LV SV MOD A2C:     69.3 ml LV SV MOD A4C:     59.9 ml LV SV MOD BP:      54.1 ml RIGHT VENTRICLE         IVC TAPSE (M-mode): 1.2 cm  IVC diam: 2.23 cm LEFT ATRIUM             Index       RIGHT ATRIUM           Index LA diam:        4.40 cm 1.71 cm/m  RA Area:     29.40 cm LA Vol (A2C):   93.0 ml 36.13 ml/m RA Volume:   108.00 ml 41.96 ml/m LA Vol (A4C):   77.6 ml 30.15 ml/m LA Biplane Vol: 94.3 ml 36.64 ml/m  AORTIC VALVE             PULMONIC VALVE LVOT Vmax:   149.00 cm/s PR End Diast Vel: 2.12 msec LVOT Vmean:  95.400 cm/s LVOT VTI:    0.197 m  AORTA Ao Asc diam: 3.20 cm MITRAL VALVE                        TRICUSPID VALVE MV Area (PHT): 4.78 cm             TR Peak grad:   55.8 mmHg MV PHT:        46.01 msec           TR Vmax:        387.00 cm/s MV Decel Time: 159 msec MV E velocity: 103.53 cm/s 103 cm/s SHUNTS                                     Systemic VTI:  0.20 m                                     Systemic Diam: 2.00 cm  [employer]  MD Electronically signed by Buford Dresser MD Signature Date/Time: 04/20/2019/6:30:29 PM    Final    VAS [place] LOWER EXTREMITY VENOUS (DVT)  Result Date: 04/20/2019  Lower Venous Study Indications: Covid-19, elevated D-Dimer.  Limitations: Body habitus. Comparison Study: No prior study on file Performing Technologist: Sharion Dove RVS  Examination Guidelines: A complete evaluation includes  B-mode imaging, spectral Doppler, color Doppler, and power Doppler as needed of all accessible portions of each vessel. Bilateral testing is considered an integral part of a complete examination. Limited examinations for reoccurring indications may be performed as noted.  +---------+---------------+---------+-----------+----------+--------------+  RIGHT     Compressibility Phasicity Spontaneity Properties Thrombus Aging  +---------+---------------+---------+-----------+----------+--------------+  CFV       Full            Yes       Yes                                    +---------+---------------+---------+-----------+----------+--------------+  SFJ       Full                                                             +---------+---------------+---------+-----------+----------+--------------+  FV Prox   Full                                                             +---------+---------------+---------+-----------+----------+--------------+  FV Mid    Full                                                             +---------+---------------+---------+-----------+----------+--------------+  FV Distal Full                                                             +---------+---------------+---------+-----------+----------+--------------+  PFV       Full                                                             +---------+---------------+---------+-----------+----------+--------------+  POP       Partial                                          Acute           +---------+---------------+---------+-----------+----------+--------------+  PTV       None  Acute           +---------+---------------+---------+-----------+----------+--------------+  PERO                                                       Not visualized  +---------+---------------+---------+-----------+----------+--------------+  Gastroc   None                                             Acute            +---------+---------------+---------+-----------+----------+--------------+   +---------+---------------+---------+-----------+----------+--------------+  LEFT      Compressibility Phasicity Spontaneity Properties Thrombus Aging  +---------+---------------+---------+-----------+----------+--------------+  CFV       Full            Yes       Yes                                    +---------+---------------+---------+-----------+----------+--------------+  SFJ       Full                                                             +---------+---------------+---------+-----------+----------+--------------+  FV Prox   Full                                                             +---------+---------------+---------+-----------+----------+--------------+  FV Mid    Full                                                             +---------+---------------+---------+-----------+----------+--------------+  FV Distal Full                                                             +---------+---------------+---------+-----------+----------+--------------+  PFV       Full                                                             +---------+---------------+---------+-----------+----------+--------------+  POP       None  Acute           +---------+---------------+---------+-----------+----------+--------------+  PTV       None                                             Acute           +---------+---------------+---------+-----------+----------+--------------+  PERO                                                       Not visualized  +---------+---------------+---------+-----------+----------+--------------+  Gastroc   None                                             Acute           +---------+---------------+---------+-----------+----------+--------------+     Summary: Right: Findings consistent with acute deep vein thrombosis involving the right popliteal vein, right posterior  tibial veins, and right gastrocnemius veins. Left: Findings consistent with acute deep vein thrombosis involving the left popliteal vein, left posterior tibial veins, and left gastrocnemius veins.  *See table(s) above for measurements and observations. Electronically signed by Monica Martinez MD on 04/20/2019 at 5:10:07 PM.    Final

## 2019-04-21 NOTE — Plan of Care (Signed)

## 2019-04-21 NOTE — Progress Notes (Signed)
CT Scan scheduled for 9AM, at this time.

## 2019-04-22 LAB — CBC WITH DIFFERENTIAL/PLATELET
Abs Immature Granulocytes: 1.56 10*3/uL — ABNORMAL HIGH (ref 0.00–0.07)
Basophils Absolute: 0.1 10*3/uL (ref 0.0–0.1)
Basophils Relative: 0 %
Eosinophils Absolute: 0 10*3/uL (ref 0.0–0.5)
Eosinophils Relative: 0 %
HCT: 30.1 % — ABNORMAL LOW (ref 39.0–52.0)
Hemoglobin: 9.8 g/dL — ABNORMAL LOW (ref 13.0–17.0)
Immature Granulocytes: 8 %
Lymphocytes Relative: 5 %
Lymphs Abs: 1 10*3/uL (ref 0.7–4.0)
MCH: 22.6 pg — ABNORMAL LOW (ref 26.0–34.0)
MCHC: 32.6 g/dL (ref 30.0–36.0)
MCV: 69.4 fL — ABNORMAL LOW (ref 80.0–100.0)
Monocytes Absolute: 0.9 10*3/uL (ref 0.1–1.0)
Monocytes Relative: 4 %
Neutro Abs: 16 10*3/uL — ABNORMAL HIGH (ref 1.7–7.7)
Neutrophils Relative %: 83 %
Platelets: 293 10*3/uL (ref 150–400)
RBC: 4.34 MIL/uL (ref 4.22–5.81)
RDW: 17.1 % — ABNORMAL HIGH (ref 11.5–15.5)
WBC: 19.5 10*3/uL — ABNORMAL HIGH (ref 4.0–10.5)
nRBC: 1.2 % — ABNORMAL HIGH (ref 0.0–0.2)

## 2019-04-22 LAB — GLUCOSE, CAPILLARY
Glucose-Capillary: 129 mg/dL — ABNORMAL HIGH (ref 70–99)
Glucose-Capillary: 190 mg/dL — ABNORMAL HIGH (ref 70–99)
Glucose-Capillary: 190 mg/dL — ABNORMAL HIGH (ref 70–99)

## 2019-04-22 LAB — C-REACTIVE PROTEIN: CRP: 8.3 mg/dL — ABNORMAL HIGH (ref <1.0)

## 2019-04-22 LAB — COMPREHENSIVE METABOLIC PANEL
ALT: 30 U/L (ref 0–44)
AST: 29 U/L (ref 15–41)
Albumin: 2.6 g/dL — ABNORMAL LOW (ref 3.5–5.0)
Alkaline Phosphatase: 60 U/L (ref 38–126)
Anion gap: 17 — ABNORMAL HIGH (ref 5–15)
BUN: 114 mg/dL — ABNORMAL HIGH (ref 8–23)
CO2: 15 mmol/L — ABNORMAL LOW (ref 22–32)
Calcium: 7.7 mg/dL — ABNORMAL LOW (ref 8.9–10.3)
Chloride: 105 mmol/L (ref 98–111)
Creatinine, Ser: 4.05 mg/dL — ABNORMAL HIGH (ref 0.61–1.24)
GFR calc Af Amer: 16 mL/min — ABNORMAL LOW (ref >60)
GFR calc non Af Amer: 14 mL/min — ABNORMAL LOW (ref >60)
Glucose, Bld: 105 mg/dL — ABNORMAL HIGH (ref 70–99)
Potassium: 5.6 mmol/L — ABNORMAL HIGH (ref 3.5–5.1)
Sodium: 137 mmol/L (ref 135–145)
Total Bilirubin: 0.4 mg/dL (ref 0.3–1.2)
Total Protein: 6.3 g/dL — ABNORMAL LOW (ref 6.5–8.1)

## 2019-04-22 LAB — HEPARIN LEVEL (UNFRACTIONATED)
Heparin Unfractionated: 0.11 IU/mL — ABNORMAL LOW (ref 0.30–0.70)
Heparin Unfractionated: 0.8 IU/mL — ABNORMAL HIGH (ref 0.30–0.70)

## 2019-04-22 LAB — D-DIMER, QUANTITATIVE: D-Dimer, Quant: >20 ug/mL-FEU — ABNORMAL HIGH (ref 0.00–0.50)

## 2019-04-22 MED ORDER — HEPARIN BOLUS VIA INFUSION
2000.0000 [IU] | Freq: Once | INTRAVENOUS | Status: AC
  Administered 2019-04-22: 11:00:00 2000 [IU] via INTRAVENOUS
  Filled 2019-04-22: qty 2000

## 2019-04-22 MED ORDER — METHYLPREDNISOLONE SODIUM SUCC 40 MG IJ SOLR
40.0000 mg | Freq: Two times a day (BID) | INTRAMUSCULAR | Status: DC
  Administered 2019-04-22: 40 mg via INTRAVENOUS
  Filled 2019-04-22: qty 1

## 2019-04-22 MED ORDER — SODIUM ZIRCONIUM CYCLOSILICATE 10 G PO PACK
10.0000 g | PACK | Freq: Every day | ORAL | Status: DC
  Administered 2019-04-22 – 2019-04-26 (×5): 10 g via ORAL
  Filled 2019-04-22 (×6): qty 1

## 2019-04-22 MED ORDER — FUROSEMIDE 10 MG/ML IJ SOLN
120.0000 mg | Freq: Once | INTRAVENOUS | Status: AC
  Administered 2019-04-22: 120 mg via INTRAVENOUS
  Filled 2019-04-22: qty 10

## 2019-04-22 NOTE — Progress Notes (Addendum)
[place] for heparin Indication: pulmonary embolus, b/l DVTs  Allergies  Allergen Reactions  . Ibuprofen   . Tylenol [Acetaminophen]     Patient Measurements: Height: 6\' 1"  (185.4 cm) Weight: (!) 304 lb 14.3 oz (138.3 kg) IBW/kg (Calculated) : 79.9 Heparin Dosing Weight: 115kg (last wt 149.7kg on 03/07/19)  Vital Signs: Temp: 97.7 F (36.5 C) (01/09 0742) Temp Source: Oral (01/09 0742) BP: 137/82 (01/09 0742) Pulse Rate: 83 (01/09 0125)  Labs: Recent Labs    04/19/19 1820 04/19/19 2003 04/19/19 2017 04/20/19 0300 04/20/19 0307 04/20/19 2000 04/21/19 0340 04/21/19 0904 04/21/19 1730 04/22/19 0423  HGB 9.6*  --   --   --   --  9.9* 9.1*  --   --  9.8*  HCT 30.0*  --   --   --   --  30.7* 28.4*  --   --  30.1*  PLT 176  --   --   --   --  283 233  --   --  293  APTT  --   --  38*  --   --  55*  --   --   --   --   LABPROT  --   --  19.4*  --   --  23.5*  --   --   --   --   INR  --   --  1.6*  --   --  2.1*  --   --   --   --   HEPARINUNFRC  --   --   --    < > 0.14*  --   --  0.34 0.47 0.11*  CREATININE 4.19*  --   --   --  4.48*  --  4.22*  --   --  4.05*  TROPONINIHS 164* 205*  --   --   --   --   --   --   --   --    < > = values in this interval not displayed.    Estimated Creatinine Clearance: 23.4 mL/min (A) (by C-G formula based on SCr of 4.05 mg/dL (H)).   Assessment: [name].  Pharmacy consulted to dose heparin for PE.  No prior AC.  Heparin level therapeutic on 2250 units/hr 1/7 pm. Decision made to treat with lytic - TPA 100mg  given 1/7 2030.  Heparin level subtherapeutic this morning at 0.11 on 1700 units/hr. Spoke with RN and no problems with infusion or bleeding. Hgb stable in 9s, platelets are normal.  Goal of Therapy: Heparin level 0.3-0.7 units/ml  Monitor platelets by anticoagulation protocol   Plan:  Heparin 2000 unit IV bolus then increase  rate to 2000 units/hr 8h HL Monitor daily HL, CBC and s/s of bleeding   Thank you for involving pharmacy in this patient's care.  Renold Genta, PharmD, BCPS Clinical Pharmacist Clinical phone for 04/22/2019 until 3:30p is [phone] 04/22/2019 9:26 AM  **Pharmacist phone directory can be found on [email or web address] listed under [facility]**

## 2019-04-22 NOTE — Progress Notes (Addendum)
[place] for heparin Indication: pulmonary embolus, b/l DVTs  Allergies  Allergen Reactions  . Ibuprofen   . Tylenol [Acetaminophen]     Patient Measurements: Height: 6\' 1"  (185.4 cm) Weight: (!) 304 lb 14.3 oz (138.3 kg) IBW/kg (Calculated) : 79.9 Heparin Dosing Weight: 115kg (last wt 149.7kg on 03/07/19)  Vital Signs: Temp: 97.7 F (36.5 C) (01/09 1606) Temp Source: Oral (01/09 1606) BP: 145/77 (01/09 1606) Pulse Rate: 92 (01/09 1606)  Labs: Recent Labs    04/19/19 2003 04/19/19 2017 04/20/19 0300 04/20/19 0307 04/20/19 2000 04/20/19 2000 04/21/19 0340 04/21/19 1730 04/22/19 0423 04/22/19 1808  HGB  --   --   --   --  9.9*  --  9.1*  --  9.8*  --   HCT  --   --   --   --  30.7*  --  28.4*  --  30.1*  --   PLT  --   --   --   --  283  --  233  --  293  --   APTT  --  38*  --   --  55*  --   --   --   --   --   LABPROT  --  19.4*  --   --  23.5*  --   --   --   --   --   INR  --  1.6*  --   --  2.1*  --   --   --   --   --   HEPARINUNFRC  --   --    < > 0.14*  --    < >  --  0.47 0.11* 0.80*  CREATININE  --   --   --  4.48*  --   --  4.22*  --  4.05*  --   TROPONINIHS 205*  --   --   --   --   --   --   --   --   --    < > = values in this interval not displayed.    Estimated Creatinine Clearance: 23.4 mL/min (A) (by C-G formula based on SCr of 4.05 mg/dL (H)).   Assessment: [name].  Pharmacy consulted to dose heparin for PE.  No prior AC.  Heparin level therapeutic on 2250 units/hr 1/7 pm. Decision made to treat with lytic - TPA 100mg  given 1/7 2030.  Heparin level subtherapeutic this morning at 0.11 on 1700 units/hr. Spoke with RN and no problems with infusion or bleeding. Hgb stable in 9s, platelets are normal.  Heparin level came back at 0.8 after the dose adjustment this AM. The effect is probably from the bolus and the rate increase. We will reduce the rate  slightly tonight and recheck in AM.   Goal of Therapy: Heparin level 0.3-0.7 units/ml  Monitor platelets by anticoagulation protocol   Plan:  Decrease heparin to 1900 units/hr F/u with AM HL Monitor daily HL, CBC and s/s of bleeding   Onnie Boer, PharmD, BCIDP, AAHIVP, CPP Infectious Disease Pharmacist 04/22/2019 7:41 PM

## 2019-04-22 NOTE — Progress Notes (Signed)
I called and updated spouse Kern Alberta on patients condition.  She thanked me for calling.

## 2019-04-22 NOTE — Progress Notes (Signed)
[facility] Progress Note   74 y.o.malewith a history of CKD, HTN DM p/w dyspnea req  supplemental oxygen found  to have CovidPNA as well as concurrent bacterial PNA and  PE + DVT. Rx TPA on 1/7 and on high  oxygen requirements. Patient had reported to the team that most of his edema was chronic.   Assessment/ Plan:   1) Acute renal failure on CKF IIIb BL Cr 1.8-2 -Secondary to ATN from Covid however also with prerenal insults at home. Appears to have been on lisinopril and losartanas well as spironolactone.Renal US without hydro and mildly increased echogenicity -No acute indication for dialysis and fortunate renal function improved today; hopefully that represents a trend. - continue holding lisinopril, losartan, and spironolactone   - Will give another Lasix 120mg  IV x1 today; Lokelma already ordered.Double checked MAR and pt not on K replacement.  2) COVID-19 pneumonia -Therapies per primary team  3) Acute hypoxic respiratory failure -Supplemental oxygen -Treat Covid as above  4)  HTN  - controlled  5)  Chronic diastolic CHF - Lasix as needed; will give another Lasix 120mg  IV x1 today  6)  Bilateral lower extremity DVT  - anticoagulation per primary team  Subjective:   Has appetite and today he states that the breathing is modestly  improved Denies f/c/n/v. Tolerating PO's.   Objective:   BP 137/82 (BP Location: Right Arm)   Pulse 83   Temp 97.7 F (36.5 C) (Oral)   Resp 18   Ht 6\' 1"  (1.854 m)   Wt (!) 138.3 kg   SpO2 94%   BMI 40.23 kg/m   Intake/Output Summary (Last 24 hours) at 04/22/2019 1006 Last data filed at 04/21/2019 1800 Gross per 24 hour  Intake 1160.51 ml  Output 640 ml  Net 520.51 ml   Weight change:   Physical Exam: General:adult male in bed in NAD HEENT:NCAT Heart:RRR Lungs:clear  Abdomen:soft and nondistended with obese habitus Extremities:obese habitus with some chronic edema at least 1+, tender  to palpation Neuro:alert and oriented, pleasant  Skin - no rash  Psych normal mood and affect  Imaging: US Renal  Result Date: 04/20/2019 CLINICAL DATA:  74 year old male with a history of acute kidney injury EXAM: RENAL / URINARY TRACT ULTRASOUND COMPLETE COMPARISON:  None. FINDINGS: Right Kidney: Length: 11.7 cm x 4.8 cm x 6.1 cm, 181 cc. Echogenicity of the right renal cortex relatively increased. No hydronephrosis. Flow confirmed in the hilum of the right kidney. No focal echogenic focus. Left Kidney: Length: 10.5 cm x 6.4 cm x 5.4 cm, 190 cc. Echogenicity of the left kidney relatively increased. No hydronephrosis. No focal echogenic focus. Flow confirmed in the hilum. Bladder: Bladder partially distended, otherwise unremarkable IMPRESSION: Negative for hydronephrosis. Mildly increased echogenicity of the bilateral kidneys, indicating medical renal disease. Electronically Signed   By: Corrie Mckusick D.O.   On: 04/20/2019 11:16   ECHOCARDIOGRAM COMPLETE  Result Date: 04/20/2019   ECHOCARDIOGRAM REPORT   Patient Name:   KERWIN AUGUSTUS Date of Exam: 04/20/2019 Medical Rec #:  [id number]        Height:       73.0 in Accession #:    [id number]       Weight:       304.9 lb Date of Birth:  Jun 08, 1945         BSA:          2.57 m Patient Age:    28 years  BP:           127/70 mmHg Patient Gender: M                HR:           86 bpm. Exam Location:  Inpatient Procedure: 2D Echo, Cardiac Doppler and Color Doppler Indications:    R06.02 SOB  History:        Patient has prior history of Echocardiogram examinations, most                 recent 05/29/2012. Abnormal ECG, Pulmonary HTN, Arrythmias:Atrial                 Flutter and NSVT, Signs/Symptoms:Chest Pain and Dyspnea; Risk                 Factors:Sleep Apnea, Hypertension, Diabetes and Dyslipidemia.                 Covid 19 positive.  Sonographer:    Roseanna Rainbow RDCS Referring Phys: [place]  Sonographer Comments: Technically difficult study  due to poor echo windows and patient is morbidly obese. Image acquisition challenging due to patient body habitus. IMPRESSIONS  1. Normal LVEF with severe pulmonary hypertension and likely PA thrombus in right PA, suggestive of pulmonary embolism. RV is only midly enlarged. Findings communicated with Dr. Sloan Leiter.  2. Left ventricular ejection fraction, by visual estimation, is 65 to 70%. The left ventricle has hyperdynamic function. There is severely increased left ventricular hypertrophy.  3. Right ventricular pressure overload.  4. The left ventricle has no regional wall motion abnormalities.  5. Global right ventricle has moderately reduced systolic function.The right ventricular size is mildly enlarged. Right vetricular wall thickness was not assessed.  6. Preserved RV apical function, consistent with McConnell's sign. RV size normal to only slightly enlarged depending on imaging window.  7. Left atrial size was mildly dilated.  8. Right atrial size was moderately dilated.  9. Small pericardial effusion. 10. The pericardial effusion is circumferential. 11. The mitral valve is normal in structure. Trivial mitral valve regurgitation. 12. The tricuspid valve is normal in structure. 13. The aortic valve is tricuspid. Aortic valve regurgitation is trivial. Mild aortic valve sclerosis without stenosis. 14. Pulmonic regurgitation is moderate. 15. The pulmonic valve was grossly normal. Pulmonic valve regurgitation is moderate. 16. Severely elevated pulmonary artery systolic pressure. 21. Suspect thromboembolism in the main pulmonary artery. 18. There is a mobile mass seen in the right PA highly suspicious for thrombus. Additional thrombus burden not excluded. 19. The tricuspid regurgitant velocity is 3.74 m/s, and with an assumed right atrial pressure of 15 mmHg, the estimated right ventricular systolic pressure is severely elevated at 70.8 mmHg. 20. The inferior vena cava is dilated in size with <50% respiratory  variability, suggesting right atrial pressure of 15 mmHg. FINDINGS  Left Ventricle: Left ventricular ejection fraction, by visual estimation, is 65 to 70%. The left ventricle has hyperdynamic function. The left ventricle has no regional wall motion abnormalities. There is severely increased left ventricular hypertrophy.  Concentric left ventricular hypertrophy. The interventricular septum is flattened in systole, consistent with right ventricular pressure overload. Right Ventricle: The right ventricular size is mildly enlarged. Right vetricular wall thickness was not assessed. Global RV systolic function is has moderately reduced systolic function. The tricuspid regurgitant velocity is 3.74 m/s, and with an assumed  right atrial pressure of 15 mmHg, the estimated right ventricular systolic pressure is severely elevated at 70.8  mmHg. Preserved RV apical function, consistent with McConnell's sign. RV size normal to only slightly enlarged depending on imaging window. Left Atrium: Left atrial size was mildly dilated. Right Atrium: Right atrial size was moderately dilated Pericardium: A small pericardial effusion is present. The pericardial effusion is circumferential. Mitral Valve: The mitral valve is normal in structure. Trivial mitral valve regurgitation. Tricuspid Valve: The tricuspid valve is normal in structure. Tricuspid valve regurgitation moderate. Aortic Valve: The aortic valve is tricuspid. . There is mild thickening and mild calcification of the aortic valve. Aortic valve regurgitation is trivial. Mild aortic valve sclerosis is present, with no evidence of aortic valve stenosis. There is mild thickening of the aortic valve. There is mild calcification of the aortic valve. Pulmonic Valve: The pulmonic valve was grossly normal. Pulmonic valve regurgitation is moderate. Pulmonic regurgitation is moderate. Aorta: The aortic root, ascending aorta and aortic arch are all structurally normal, with no evidence of  dilitation or obstruction. Pulmonary Artery: Suspect thromboembolism in the main pulmonary artery. There is a mobile mass seen in the right PA highly suspicious for thrombus. Additional thrombus burden not excluded. Venous: The inferior vena cava is dilated in size with less than 50% respiratory variability, suggesting right atrial pressure of 15 mmHg. IAS/Shunts: No atrial level shunt detected by color flow Doppler.  LEFT VENTRICLE PLAX 2D LVIDd:         4.29 cm LVIDs:         2.34 cm LV PW:         2.08 cm LV IVS:        2.18 cm LVOT diam:     2.00 cm LV SV:         64 ml LV SV Index:   23.37 LVOT Area:     3.14 cm  LV Volumes (MOD) LV area d, A2C:    28.70 cm LV area d, A4C:    22.30 cm LV area s, A2C:    15.20 cm LV area s, A4C:    12.20 cm LV major d, A2C:   6.53 cm LV major d, A4C:   7.35 cm LV major s, A2C:   5.76 cm LV major s, A4C:   5.75 cm LV vol d, MOD A2C: 104.0 ml LV vol d, MOD A4C: 59.9 ml LV vol s, MOD A2C: 34.7 ml LV vol s, MOD A4C: 23.8 ml LV SV MOD A2C:     69.3 ml LV SV MOD A4C:     59.9 ml LV SV MOD BP:      54.1 ml RIGHT VENTRICLE         IVC TAPSE (M-mode): 1.2 cm  IVC diam: 2.23 cm LEFT ATRIUM             Index       RIGHT ATRIUM           Index LA diam:        4.40 cm 1.71 cm/m  RA Area:     29.40 cm LA Vol (A2C):   93.0 ml 36.13 ml/m RA Volume:   108.00 ml 41.96 ml/m LA Vol (A4C):   77.6 ml 30.15 ml/m LA Biplane Vol: 94.3 ml 36.64 ml/m  AORTIC VALVE             PULMONIC VALVE LVOT Vmax:   149.00 cm/s PR End Diast Vel: 2.12 msec LVOT Vmean:  95.400 cm/s LVOT VTI:    0.197 m  AORTA Ao Asc diam: 3.20 cm MITRAL VALVE  TRICUSPID VALVE MV Area (PHT): 4.78 cm             TR Peak grad:   55.8 mmHg MV PHT:        46.01 msec           TR Vmax:        387.00 cm/s MV Decel Time: 159 msec MV E velocity: 103.53 cm/s 103 cm/s SHUNTS                                     Systemic VTI:  0.20 m                                     Systemic Diam: 2.00 cm  Buford Dresser  MD Electronically signed by Buford Dresser MD Signature Date/Time: 04/20/2019/6:30:29 PM    Final    VAS [place] LOWER EXTREMITY VENOUS (DVT)  Result Date: 04/20/2019  Lower Venous Study Indications: Covid-19, elevated D-Dimer.  Limitations: Body habitus. Comparison Study: No prior study on file Performing Technologist: Sharion Dove RVS  Examination Guidelines: A complete evaluation includes B-mode imaging, spectral Doppler, color Doppler, and power Doppler as needed of all accessible portions of each vessel. Bilateral testing is considered an integral part of a complete examination. Limited examinations for reoccurring indications may be performed as noted.  +---------+---------------+---------+-----------+----------+--------------+ RIGHT    CompressibilityPhasicitySpontaneityPropertiesThrombus Aging +---------+---------------+---------+-----------+----------+--------------+ CFV      Full           Yes      Yes                                 +---------+---------------+---------+-----------+----------+--------------+ SFJ      Full                                                        +---------+---------------+---------+-----------+----------+--------------+ FV Prox  Full                                                        +---------+---------------+---------+-----------+----------+--------------+ FV Mid   Full                                                        +---------+---------------+---------+-----------+----------+--------------+ FV DistalFull                                                        +---------+---------------+---------+-----------+----------+--------------+ PFV      Full                                                        +---------+---------------+---------+-----------+----------+--------------+  POP      Partial                                      Acute           +---------+---------------+---------+-----------+----------+--------------+ PTV      None                                         Acute          +---------+---------------+---------+-----------+----------+--------------+ PERO                                                  Not visualized +---------+---------------+---------+-----------+----------+--------------+ Gastroc  None                                         Acute          +---------+---------------+---------+-----------+----------+--------------+   +---------+---------------+---------+-----------+----------+--------------+ LEFT     CompressibilityPhasicitySpontaneityPropertiesThrombus Aging +---------+---------------+---------+-----------+----------+--------------+ CFV      Full           Yes      Yes                                 +---------+---------------+---------+-----------+----------+--------------+ SFJ      Full                                                        +---------+---------------+---------+-----------+----------+--------------+ FV Prox  Full                                                        +---------+---------------+---------+-----------+----------+--------------+ FV Mid   Full                                                        +---------+---------------+---------+-----------+----------+--------------+ FV DistalFull                                                        +---------+---------------+---------+-----------+----------+--------------+ PFV      Full                                                        +---------+---------------+---------+-----------+----------+--------------+ POP  None                                         Acute          +---------+---------------+---------+-----------+----------+--------------+ PTV      None                                         Acute           +---------+---------------+---------+-----------+----------+--------------+ PERO                                                  Not visualized +---------+---------------+---------+-----------+----------+--------------+ Gastroc  None                                         Acute          +---------+---------------+---------+-----------+----------+--------------+     Summary: Right: Findings consistent with acute deep vein thrombosis involving the right popliteal vein, right posterior tibial veins, and right gastrocnemius veins. Left: Findings consistent with acute deep vein thrombosis involving the left popliteal vein, left posterior tibial veins, and left gastrocnemius veins.  *See table(s) above for measurements and observations. Electronically signed by Monica Martinez MD on 04/20/2019 at 5:10:07 PM.    Final     Labs: BMET Recent Labs  Lab 04/19/19 1820 04/20/19 0307 04/21/19 0340 04/22/19 0423  NA 134* 134* 135 137  K 5.2* 5.3* 5.0 5.6*  CL 101 102 103 105  CO2 19* 17* 18* 15*  GLUCOSE 176* 123* 142* 105*  BUN 77* 88* 111* 114*  CREATININE 4.19* 4.48* 4.22* 4.05*  CALCIUM 7.9* 7.8* 7.7* 7.7*   CBC Recent Labs  Lab 04/19/19 1820 04/20/19 0300 04/20/19 2000 04/21/19 0340 04/22/19 0423  WBC 12.2* 11.8* 17.3* 16.1* 19.5*  NEUTROABS 10.0* 9.7*  --  13.6* 16.0*  HGB 9.6* 9.1* 9.9* 9.1* 9.8*  HCT 30.0* 28.5* 30.7* 28.4* 30.1*  MCV 70.4* 69.0* 69.0* 68.4* 69.4*  PLT 176 167 283 233 293    Medications:    . vitamin C  500 mg Oral Daily  . Chlorhexidine Gluconate Cloth  6 each Topical Daily  . heparin  2,000 Units Intravenous Once  . insulin aspart  0-9 Units Subcutaneous TID WC  . methylPREDNISolone (SOLU-MEDROL) injection  40 mg Intravenous Q12H  . pantoprazole  40 mg Oral Daily  . sodium zirconium cyclosilicate  10 g Oral Daily  . zinc sulfate  220 mg Oral Daily      Otelia Santee, MD 04/22/2019, 10:06 AM

## 2019-04-22 NOTE — Progress Notes (Signed)
PROGRESS NOTE                                                                                                                                                                                                             Patient Demographics:    Nathan Grant, is a 74 y.o. male, DOB - [birth year], VZC:[id number]  Outpatient Primary MD for the patient is Forrest Moron, MD   Admit date - 04/19/2019   LOS - 3  Chief Complaint  Patient presents with  . Respiratory Distress       Brief Narrative: Patient is a 74 y.o. male with PMHx of HTN, DM-2, CKD stage IIIb, history of PAF-presented to the ED with 3-day history of shortness of breath-found to have acute hypoxic respiratory failure secondary to COVID-19 pneumonia along with AKI.  Patient was subsequently admitted to the hospitalist service at [facility].  Significant events: 1/7>> Doppler lower extremity positive for bilateral DVT 1/7>> TTE with clot in pulmonary artery with moderate RV dysfunction and severe pulmonary hypertension 1/7>> transferred to ICU given TPA 1/8>> oxygenation improved-transfer back to PCU    Subjective:    Nathan Grant feels much better, he is down to just 2 L of oxygen!   Assessment  & Plan :   Acute Hypoxic Resp Failure due to pulmonary embolism with RV strain and Covid 19 with concurrent bacterial pneumonia: Remarkable improvement post TPA-down to just 2 L of oxygen.  Continue IV heparin infusion, along with steroids/remdesivir and empiric Rocephin and Zithromax.    Fever: afebrile  O2 requirements:  SpO2: 94 % O2 Flow Rate (L/min): 2 L/min   COVID-19 Labs: Recent Labs    04/19/19 2003 04/20/19 0307 04/21/19 0340 04/22/19 0423  DDIMER  --  >20.00* >20.00* >20.00*  LDH 761*  --   --   --   CRP  --  18.8* 14.1* 8.3*       Component Value Date/Time   BNP 413.0 (H) 04/19/2019 1820    Recent Labs  Lab  04/19/19 2003 04/20/19 0307  PROCALCITON 0.96 2.16    Lab Results  Component Value Date   SARSCOV2NAA Detected (A) 04/12/2019     COVID-19 Medications: Steroids: 1/6>> Remdesivir:1/6>>  Other medications: Antibiotics: Rocephin: 1/7>> Zithromax: 1/7>>  Prone/Incentive Spirometry: encouraged patient to lie prone for 3-4 hours at a  time for a total of 16 hours a day, and to encourage incentive spirometry use 3-4/hour.  DVT Prophylaxis  :   Heparin gtt  AKI on CKD stage IIIb: Suspect this is hemodynamically mediated in the setting of acute infection with COVID-19 along with the use of Aldactone and losartan.  UA without proteinuria, renal ultrasound negative for hydronephrosis.  Making urine-creatinine is trending down-however BUN awaited.  Mildly hyperkalemic today-start on [facility], nephrology has written for IV Lasix.  Continue to monitor closely-agree with nephrology-no emergent indication for RRT at this point.  Pulmonary embolism with RV strain and bilateral lower extremity Doppler: Due to severe hypoxemia-underwent TPA infusion on 1/7-remains on IV heparin-with plans to transition to oral anticoagulation agent (if renal function continues to improve-and if felt not to require further procedures).    Note echo on 1/7 demonstrated a pulmonary embolus (clot seen in PA) with severe pulmonary hypertension.    HTN: BP slowly creeping up-we will get IV Lasix today-watch closely-May need to be started on antihypertensives soon.  Patient on multiple antihypertensives at home.  DM-2 (A1c 6.4): Continue close monitoring with SSI-follow closely.  CBG (last 3)  Recent Labs    04/21/19 2045 04/22/19 0809 04/22/19 1209  GLUCAP 176* 129* 190*    Chronic diastolic heart failure: Remains compensated-has chronic-1+ pitting edema in the lower extremities-getting 1 dose of IV Lasix today.     PAF: Currently in sinus rhythm-anticoagulated for VTE.  Obesity: Estimated body mass index is  40.23 kg/m as calculated from the following:   Height as of this encounter: 6\' 1"  (1.854 m).   Weight as of this encounter: 138.3 kg.    Consults  : Nephrology,PCCM  Procedures  :  None  ABG:    Component Value Date/Time   TCO2 28 10/03/2017 1025    Vent Settings: N/A  Condition -stable  Family Communication  : Spouse updated over the phone.  Code Status :  Full Code  Diet :  Diet Order            Diet Heart Room service appropriate? Yes; Fluid consistency: Thin  Diet effective now               Disposition Plan  :  Remain hospitalized-suspect discharge over the next few days if renal function improves significantly-suspect may require home health services.  Await PT/OT eval.  Barriers to discharge: Hypoxia requiring O2 supplementation/complete 5 days of IV Remdesivir  Antimicorbials  :    Anti-infectives (From admission, onward)   Start     Dose/Rate Route Frequency Ordered Stop   04/21/19 1600  remdesivir 100 mg in sodium chloride 0.9 % 100 mL IVPB     100 mg 200 mL/hr over 30 Minutes Intravenous Daily 04/20/19 1658 04/24/19 0959   04/21/19 1000  remdesivir 100 mg in sodium chloride 0.9 % 100 mL IVPB  Status:  Discontinued     100 mg 200 mL/hr over 30 Minutes Intravenous Daily 04/20/19 0525 04/20/19 1658   04/20/19 2000  remdesivir 100 mg in sodium chloride 0.9 % 100 mL IVPB     100 mg 200 mL/hr over 30 Minutes Intravenous  Once 04/20/19 1658 04/20/19 2026   04/20/19 1400  azithromycin (ZITHROMAX) 500 mg in sodium chloride 0.9 % 250 mL IVPB     500 mg 250 mL/hr over 60 Minutes Intravenous Every 24 hours 04/20/19 1308     04/20/19 1330  cefTRIAXone (ROCEPHIN) 1 g in sodium chloride 0.9 % 100 mL IVPB  1 g 200 mL/hr over 30 Minutes Intravenous Every 24 hours 04/20/19 1308 04/25/19 1329   04/20/19 1000  remdesivir 100 mg in sodium chloride 0.9 % 100 mL IVPB  Status:  Discontinued     100 mg 200 mL/hr over 30 Minutes Intravenous Daily 04/19/19 2015 04/20/19  0525   04/19/19 2200  remdesivir 200 mg in sodium chloride 0.9% 250 mL IVPB     200 mg 580 mL/hr over 30 Minutes Intravenous Once 04/19/19 2015 04/20/19 0302      Inpatient Medications  Scheduled Meds: . vitamin C  500 mg Oral Daily  . Chlorhexidine Gluconate Cloth  6 each Topical Daily  . insulin aspart  0-9 Units Subcutaneous TID WC  . methylPREDNISolone (SOLU-MEDROL) injection  40 mg Intravenous Q12H  . pantoprazole  40 mg Oral Daily  . sodium zirconium cyclosilicate  10 g Oral Daily  . zinc sulfate  220 mg Oral Daily   Continuous Infusions: . azithromycin Stopped (04/21/19 1658)  . cefTRIAXone (ROCEPHIN)  IV 1 g (04/22/19 1308)  . heparin 2,000 Units/hr (04/22/19 1056)  . remdesivir 100 mg in NS 100 mL 100 mg (04/22/19 0910)   PRN Meds:.albuterol, hydrALAZINE, ondansetron **OR** ondansetron (ZOFRAN) IV, polyethylene glycol   Time Spent in minutes 35  See all Orders from today for further details   Oren Binet M.D on 04/22/2019 at 1:50 PM  To page go to [email or web address] - use universal password  [facility] -  Office  [phone]    Objective:   Vitals:   04/22/19 0125 04/22/19 0400 04/22/19 0742 04/22/19 1145  BP: 121/88  137/82 (!) 163/85  Pulse: 83     Resp: 20 18    Temp: 98.6 F (37 C) 98.8 F (37.1 C) 97.7 F (36.5 C) 98 F (36.7 C)  TempSrc: Oral Oral Oral Oral  SpO2: 94%     Weight:      Height:        Wt Readings from Last 3 Encounters:  04/19/19 (!) 138.3 kg  03/07/19 (!) 149.7 kg  01/27/19 (!) 147.4 kg     Intake/Output Summary (Last 24 hours) at 04/22/2019 1350 Last data filed at 04/22/2019 0910 Gross per 24 hour  Intake 1510.51 ml  Output 1550 ml  Net -39.49 ml     Physical Exam Gen Exam:Alert awake-not in any distress HEENT:atraumatic, normocephalic Chest: B/L clear to auscultation anteriorly CVS:S1S2 regular Abdomen:soft non tender, non distended Extremities:no edema Neurology: Non focal Skin: no rash   Data  Review:    CBC Recent Labs  Lab 04/19/19 1820 04/20/19 0300 04/20/19 2000 04/21/19 0340 04/22/19 0423  WBC 12.2* 11.8* 17.3* 16.1* 19.5*  HGB 9.6* 9.1* 9.9* 9.1* 9.8*  HCT 30.0* 28.5* 30.7* 28.4* 30.1*  PLT 176 167 283 233 293  MCV 70.4* 69.0* 69.0* 68.4* 69.4*  MCH 22.5* 22.0* 22.2* 21.9* 22.6*  MCHC 32.0 31.9 32.2 32.0 32.6  RDW 16.4* 17.0* 16.9* 17.0* 17.1*  LYMPHSABS 1.0 1.1  --  1.1 1.0  MONOABS 0.6 0.4  --  0.5 0.9  EOSABS 0.0 0.0  --  0.0 0.0  BASOSABS 0.0 0.0  --  0.0 0.1    Chemistries  Recent Labs  Lab 04/19/19 1820 04/20/19 0307 04/21/19 0340 04/22/19 0423  NA 134* 134* 135 137  K 5.2* 5.3* 5.0 5.6*  CL 101 102 103 105  CO2 19* 17* 18* 15*  GLUCOSE 176* 123* 142* 105*  BUN 77* 88* 111* 114*  CREATININE 4.19* 4.48* 4.22* 4.05*  CALCIUM 7.9* 7.8* 7.7* 7.7*  AST 56* 73* 40 29  ALT 33 40 34 30  ALKPHOS 58 62 56 60  BILITOT 1.4* 1.3* 0.7 0.4   ------------------------------------------------------------------------------------------------------------------ No results for input(s): CHOL, HDL, LDLCALC, TRIG, CHOLHDL, LDLDIRECT in the last 72 hours.  Lab Results  Component Value Date   HGBA1C 6.4 (A) 03/07/2019   ------------------------------------------------------------------------------------------------------------------ No results for input(s): TSH, T4TOTAL, T3FREE, THYROIDAB in the last 72 hours.  Invalid input(s): FREET3 ------------------------------------------------------------------------------------------------------------------ No results for input(s): VITAMINB12, FOLATE, FERRITIN, TIBC, IRON, RETICCTPCT in the last 72 hours.  Coagulation profile Recent Labs  Lab 04/19/19 2017 04/20/19 2000  INR 1.6* 2.1*    Recent Labs    04/21/19 0340 04/22/19 0423  DDIMER >20.00* >20.00*    Cardiac Enzymes No results for input(s): CKMB, TROPONINI, MYOGLOBIN in the last 168 hours.  Invalid input(s):  CK ------------------------------------------------------------------------------------------------------------------    Component Value Date/Time   BNP 413.0 (H) 04/19/2019 1820    Micro Results Recent Results (from the past 240 hour(s))  Blood culture (routine x 2)     Status: None (Preliminary result)   Collection Time: 04/19/19  6:20 PM   Specimen: BLOOD  Result Value Ref Range Status   Specimen Description   Final    BLOOD RIGHT ANTECUBITAL Performed at [facility] [address], [place], [place] [address]    Special Requests   Final    BOTTLES DRAWN AEROBIC ONLY Blood Culture adequate volume   Culture   Final    NO GROWTH 3 DAYS Performed at [facility], [place] [address], [place], [place] [address]    Report Status PENDING  Incomplete  Blood culture (routine x 2)     Status: None (Preliminary result)   Collection Time: 04/19/19  6:35 PM   Specimen: BLOOD  Result Value Ref Range Status   Specimen Description   Final    BLOOD LEFT ANTECUBITAL Performed at [facility] [address] [place], [place] [address]    Special Requests   Final    BOTTLES DRAWN AEROBIC ONLY Blood Culture adequate volume   Culture   Final    NO GROWTH 3 DAYS Performed at [facility], [place] [address] [place], [place] [address]    Report Status PENDING  Incomplete  MRSA PCR Screening     Status: None   Collection Time: 04/21/19  4:24 AM   Specimen: Nasal Mucosa; Nasopharyngeal  Result Value Ref Range Status   MRSA by PCR NEGATIVE NEGATIVE Final    Comment:        The GeneXpert MRSA Assay (FDA approved for NASAL specimens only), is one component of a comprehensive MRSA colonization surveillance program. It is not intended to diagnose MRSA infection nor to guide or monitor treatment for MRSA infections. Performed at [facility], [place] [address], [place], [place] [address]     Radiology Reports US  Renal  Result Date: 04/20/2019 CLINICAL DATA:  74 year old male with a history of acute kidney injury EXAM: RENAL / URINARY TRACT ULTRASOUND COMPLETE COMPARISON:  None. FINDINGS: Right Kidney: Length: 11.7 cm x 4.8 cm x 6.1 cm, 181 cc. Echogenicity of the right renal cortex relatively increased. No hydronephrosis. Flow confirmed in the hilum of the right kidney. No focal echogenic focus. Left Kidney: Length: 10.5 cm x 6.4 cm x 5.4 cm, 190 cc. Echogenicity of the left kidney relatively increased. No hydronephrosis. No focal echogenic focus. Flow confirmed in the hilum. Bladder:  Bladder partially distended, otherwise unremarkable IMPRESSION: Negative for hydronephrosis. Mildly increased echogenicity of the bilateral kidneys, indicating medical renal disease. Electronically Signed   By: Corrie Mckusick D.O.   On: 04/20/2019 11:16   DG Chest Portable 1 View  Result Date: 04/19/2019 CLINICAL DATA:  Hypoxia. EXAM: PORTABLE CHEST 1 VIEW COMPARISON:  December 09, 2013 FINDINGS: Mild hazy infiltrates are seen within the bilateral lung bases. There is no evidence of a pleural effusion or pneumothorax. The cardiac silhouette is mildly enlarged. The visualized skeletal structures are unremarkable. IMPRESSION: Mild hazy bibasilar infiltrates. Electronically Signed   By: Virgina Norfolk M.D.   On: 04/19/2019 18:54   ECHOCARDIOGRAM COMPLETE  Result Date: 04/20/2019   ECHOCARDIOGRAM REPORT   Patient Name:   CAIDIN HEIDENREICH Date of Exam: 04/20/2019 Medical Rec #:  [id number]        Height:       73.0 in Accession #:    [id number]       Weight:       304.9 lb Date of Birth:  [birth year]         BSA:          2.57 m Patient Age:    63 years         BP:           127/70 mmHg Patient Gender: M                HR:           86 bpm. Exam Location:  Inpatient Procedure: 2D Echo, Cardiac Doppler and Color Doppler Indications:    R06.02 SOB  History:        Patient has prior history of Echocardiogram examinations, most                 recent  05/29/2012. Abnormal ECG, Pulmonary HTN, Arrythmias:Atrial                 Flutter and NSVT, Signs/Symptoms:Chest Pain and Dyspnea; Risk                 Factors:Sleep Apnea, Hypertension, Diabetes and Dyslipidemia.                 Covid 19 positive.  Sonographer:    Roseanna Rainbow RDCS Referring Phys: [place]  Sonographer Comments: Technically difficult study due to poor echo windows and patient is morbidly obese. Image acquisition challenging due to patient body habitus. IMPRESSIONS  1. Normal LVEF with severe pulmonary hypertension and likely PA thrombus in right PA, suggestive of pulmonary embolism. RV is only midly enlarged. Findings communicated with Dr. Sloan Leiter.  2. Left ventricular ejection fraction, by visual estimation, is 65 to 70%. The left ventricle has hyperdynamic function. There is severely increased left ventricular hypertrophy.  3. Right ventricular pressure overload.  4. The left ventricle has no regional wall motion abnormalities.  5. Global right ventricle has moderately reduced systolic function.The right ventricular size is mildly enlarged. Right vetricular wall thickness was not assessed.  6. Preserved RV apical function, consistent with McConnell's sign. RV size normal to only slightly enlarged depending on imaging window.  7. Left atrial size was mildly dilated.  8. Right atrial size was moderately dilated.  9. Small pericardial effusion. 10. The pericardial effusion is circumferential. 11. The mitral valve is normal in structure. Trivial mitral valve regurgitation. 12. The tricuspid valve is normal in structure. 13. The aortic valve is tricuspid. Aortic valve regurgitation is trivial. Mild aortic  valve sclerosis without stenosis. 14. Pulmonic regurgitation is moderate. 15. The pulmonic valve was grossly normal. Pulmonic valve regurgitation is moderate. 16. Severely elevated pulmonary artery systolic pressure. 9. Suspect thromboembolism in the main pulmonary artery. 18. There is a  mobile mass seen in the right PA highly suspicious for thrombus. Additional thrombus burden not excluded. 19. The tricuspid regurgitant velocity is 3.74 m/s, and with an assumed right atrial pressure of 15 mmHg, the estimated right ventricular systolic pressure is severely elevated at 70.8 mmHg. 20. The inferior vena cava is dilated in size with <50% respiratory variability, suggesting right atrial pressure of 15 mmHg. FINDINGS  Left Ventricle: Left ventricular ejection fraction, by visual estimation, is 65 to 70%. The left ventricle has hyperdynamic function. The left ventricle has no regional wall motion abnormalities. There is severely increased left ventricular hypertrophy.  Concentric left ventricular hypertrophy. The interventricular septum is flattened in systole, consistent with right ventricular pressure overload. Right Ventricle: The right ventricular size is mildly enlarged. Right vetricular wall thickness was not assessed. Global RV systolic function is has moderately reduced systolic function. The tricuspid regurgitant velocity is 3.74 m/s, and with an assumed  right atrial pressure of 15 mmHg, the estimated right ventricular systolic pressure is severely elevated at 70.8 mmHg. Preserved RV apical function, consistent with McConnell's sign. RV size normal to only slightly enlarged depending on imaging window. Left Atrium: Left atrial size was mildly dilated. Right Atrium: Right atrial size was moderately dilated Pericardium: A small pericardial effusion is present. The pericardial effusion is circumferential. Mitral Valve: The mitral valve is normal in structure. Trivial mitral valve regurgitation. Tricuspid Valve: The tricuspid valve is normal in structure. Tricuspid valve regurgitation moderate. Aortic Valve: The aortic valve is tricuspid. . There is mild thickening and mild calcification of the aortic valve. Aortic valve regurgitation is trivial. Mild aortic valve sclerosis is present, with no  evidence of aortic valve stenosis. There is mild thickening of the aortic valve. There is mild calcification of the aortic valve. Pulmonic Valve: The pulmonic valve was grossly normal. Pulmonic valve regurgitation is moderate. Pulmonic regurgitation is moderate. Aorta: The aortic root, ascending aorta and aortic arch are all structurally normal, with no evidence of dilitation or obstruction. Pulmonary Artery: Suspect thromboembolism in the main pulmonary artery. There is a mobile mass seen in the right PA highly suspicious for thrombus. Additional thrombus burden not excluded. Venous: The inferior vena cava is dilated in size with less than 50% respiratory variability, suggesting right atrial pressure of 15 mmHg. IAS/Shunts: No atrial level shunt detected by color flow Doppler.  LEFT VENTRICLE PLAX 2D LVIDd:         4.29 cm LVIDs:         2.34 cm LV PW:         2.08 cm LV IVS:        2.18 cm LVOT diam:     2.00 cm LV SV:         64 ml LV SV Index:   23.37 LVOT Area:     3.14 cm  LV Volumes (MOD) LV area d, A2C:    28.70 cm LV area d, A4C:    22.30 cm LV area s, A2C:    15.20 cm LV area s, A4C:    12.20 cm LV major d, A2C:   6.53 cm LV major d, A4C:   7.35 cm LV major s, A2C:   5.76 cm LV major s, A4C:   5.75 cm LV vol  d, MOD A2C: 104.0 ml LV vol d, MOD A4C: 59.9 ml LV vol s, MOD A2C: 34.7 ml LV vol s, MOD A4C: 23.8 ml LV SV MOD A2C:     69.3 ml LV SV MOD A4C:     59.9 ml LV SV MOD BP:      54.1 ml RIGHT VENTRICLE         IVC TAPSE (M-mode): 1.2 cm  IVC diam: 2.23 cm LEFT ATRIUM             Index       RIGHT ATRIUM           Index LA diam:        4.40 cm 1.71 cm/m  RA Area:     29.40 cm LA Vol (A2C):   93.0 ml 36.13 ml/m RA Volume:   108.00 ml 41.96 ml/m LA Vol (A4C):   77.6 ml 30.15 ml/m LA Biplane Vol: 94.3 ml 36.64 ml/m  AORTIC VALVE             PULMONIC VALVE LVOT Vmax:   149.00 cm/s PR End Diast Vel: 2.12 msec LVOT Vmean:  95.400 cm/s LVOT VTI:    0.197 m  AORTA Ao Asc diam: 3.20 cm MITRAL VALVE                         TRICUSPID VALVE MV Area (PHT): 4.78 cm             TR Peak grad:   55.8 mmHg MV PHT:        46.01 msec           TR Vmax:        387.00 cm/s MV Decel Time: 159 msec MV E velocity: 103.53 cm/s 103 cm/s SHUNTS                                     Systemic VTI:  0.20 m                                     Systemic Diam: 2.00 cm  Buford Dresser MD Electronically signed by Buford Dresser MD Signature Date/Time: 04/20/2019/6:30:29 PM    Final    VAS [place] LOWER EXTREMITY VENOUS (DVT)  Result Date: 04/20/2019  Lower Venous Study Indications: Covid-19, elevated D-Dimer.  Limitations: Body habitus. Comparison Study: No prior study on file Performing Technologist: Sharion Dove RVS  Examination Guidelines: A complete evaluation includes B-mode imaging, spectral Doppler, color Doppler, and power Doppler as needed of all accessible portions of each vessel. Bilateral testing is considered an integral part of a complete examination. Limited examinations for reoccurring indications may be performed as noted.  +---------+---------------+---------+-----------+----------+--------------+ RIGHT    CompressibilityPhasicitySpontaneityPropertiesThrombus Aging +---------+---------------+---------+-----------+----------+--------------+ CFV      Full           Yes      Yes                                 +---------+---------------+---------+-----------+----------+--------------+ SFJ      Full                                                        +---------+---------------+---------+-----------+----------+--------------+  FV Prox  Full                                                        +---------+---------------+---------+-----------+----------+--------------+ FV Mid   Full                                                        +---------+---------------+---------+-----------+----------+--------------+ FV DistalFull                                                         +---------+---------------+---------+-----------+----------+--------------+ PFV      Full                                                        +---------+---------------+---------+-----------+----------+--------------+ POP      Partial                                      Acute          +---------+---------------+---------+-----------+----------+--------------+ PTV      None                                         Acute          +---------+---------------+---------+-----------+----------+--------------+ PERO                                                  Not visualized +---------+---------------+---------+-----------+----------+--------------+ Gastroc  None                                         Acute          +---------+---------------+---------+-----------+----------+--------------+   +---------+---------------+---------+-----------+----------+--------------+ LEFT     CompressibilityPhasicitySpontaneityPropertiesThrombus Aging +---------+---------------+---------+-----------+----------+--------------+ CFV      Full           Yes      Yes                                 +---------+---------------+---------+-----------+----------+--------------+ SFJ      Full                                                        +---------+---------------+---------+-----------+----------+--------------+ FV Prox  Full                                                        +---------+---------------+---------+-----------+----------+--------------+  FV Mid   Full                                                        +---------+---------------+---------+-----------+----------+--------------+ FV DistalFull                                                        +---------+---------------+---------+-----------+----------+--------------+ PFV      Full                                                         +---------+---------------+---------+-----------+----------+--------------+ POP      None                                         Acute          +---------+---------------+---------+-----------+----------+--------------+ PTV      None                                         Acute          +---------+---------------+---------+-----------+----------+--------------+ PERO                                                  Not visualized +---------+---------------+---------+-----------+----------+--------------+ Gastroc  None                                         Acute          +---------+---------------+---------+-----------+----------+--------------+     Summary: Right: Findings consistent with acute deep vein thrombosis involving the right popliteal vein, right posterior tibial veins, and right gastrocnemius veins. Left: Findings consistent with acute deep vein thrombosis involving the left popliteal vein, left posterior tibial veins, and left gastrocnemius veins.  *See table(s) above for measurements and observations. Electronically signed by Monica Martinez MD on 04/20/2019 at 5:10:07 PM.    Final

## 2019-04-23 LAB — GLUCOSE, CAPILLARY
Glucose-Capillary: 132 mg/dL — ABNORMAL HIGH (ref 70–99)
Glucose-Capillary: 165 mg/dL — ABNORMAL HIGH (ref 70–99)
Glucose-Capillary: 167 mg/dL — ABNORMAL HIGH (ref 70–99)
Glucose-Capillary: 166 mg/dL — ABNORMAL HIGH (ref 70–99)

## 2019-04-23 LAB — CBC WITH DIFFERENTIAL/PLATELET
Abs Immature Granulocytes: 1.78 10*3/uL — ABNORMAL HIGH (ref 0.00–0.07)
Basophils Absolute: 0.1 10*3/uL (ref 0.0–0.1)
Basophils Relative: 0 %
Eosinophils Absolute: 0 10*3/uL (ref 0.0–0.5)
Eosinophils Relative: 0 %
HCT: 31.2 % — ABNORMAL LOW (ref 39.0–52.0)
Hemoglobin: 10.1 g/dL — ABNORMAL LOW (ref 13.0–17.0)
Immature Granulocytes: 8 %
Lymphocytes Relative: 5 %
Lymphs Abs: 1.1 10*3/uL (ref 0.7–4.0)
MCH: 22.2 pg — ABNORMAL LOW (ref 26.0–34.0)
MCHC: 32.4 g/dL (ref 30.0–36.0)
MCV: 68.7 fL — ABNORMAL LOW (ref 80.0–100.0)
Monocytes Absolute: 1.1 10*3/uL — ABNORMAL HIGH (ref 0.1–1.0)
Monocytes Relative: 5 %
Neutro Abs: 17.2 10*3/uL — ABNORMAL HIGH (ref 1.7–7.7)
Neutrophils Relative %: 82 %
Platelets: 363 10*3/uL (ref 150–400)
RBC: 4.54 MIL/uL (ref 4.22–5.81)
RDW: 17.9 % — ABNORMAL HIGH (ref 11.5–15.5)
WBC: 21.2 10*3/uL — ABNORMAL HIGH (ref 4.0–10.5)
nRBC: 0.9 % — ABNORMAL HIGH (ref 0.0–0.2)

## 2019-04-23 LAB — COMPREHENSIVE METABOLIC PANEL
ALT: 27 U/L (ref 0–44)
AST: 22 U/L (ref 15–41)
Albumin: 2.7 g/dL — ABNORMAL LOW (ref 3.5–5.0)
Alkaline Phosphatase: 52 U/L (ref 38–126)
Anion gap: 13 (ref 5–15)
BUN: 111 mg/dL — ABNORMAL HIGH (ref 8–23)
CO2: 22 mmol/L (ref 22–32)
Calcium: 8 mg/dL — ABNORMAL LOW (ref 8.9–10.3)
Chloride: 103 mmol/L (ref 98–111)
Creatinine, Ser: 3.85 mg/dL — ABNORMAL HIGH (ref 0.61–1.24)
GFR calc Af Amer: 17 mL/min — ABNORMAL LOW (ref >60)
GFR calc non Af Amer: 14 mL/min — ABNORMAL LOW (ref >60)
Glucose, Bld: 122 mg/dL — ABNORMAL HIGH (ref 70–99)
Potassium: 5.4 mmol/L — ABNORMAL HIGH (ref 3.5–5.1)
Sodium: 138 mmol/L (ref 135–145)
Total Bilirubin: 0.9 mg/dL (ref 0.3–1.2)
Total Protein: 6.5 g/dL (ref 6.5–8.1)

## 2019-04-23 LAB — HEPARIN LEVEL (UNFRACTIONATED)
Heparin Unfractionated: 1.13 IU/mL — ABNORMAL HIGH (ref 0.30–0.70)
Heparin Unfractionated: 0.9 IU/mL — ABNORMAL HIGH (ref 0.30–0.70)

## 2019-04-23 LAB — MAGNESIUM: Magnesium: 2.5 mg/dL — ABNORMAL HIGH (ref 1.7–2.4)

## 2019-04-23 LAB — D-DIMER, QUANTITATIVE: D-Dimer, Quant: >20 ug/mL-FEU — ABNORMAL HIGH (ref 0.00–0.50)

## 2019-04-23 LAB — C-REACTIVE PROTEIN: CRP: 5.4 mg/dL — ABNORMAL HIGH (ref <1.0)

## 2019-04-23 MED ORDER — HEPARIN (PORCINE) 25000 UT/250ML-% IV SOLN
1400.0000 [IU]/h | INTRAVENOUS | Status: DC
  Administered 2019-04-23 – 2019-04-24 (×3): 1500 [IU]/h via INTRAVENOUS
  Administered 2019-04-25 – 2019-04-26 (×3): 1400 [IU]/h via INTRAVENOUS
  Filled 2019-04-23 (×5): qty 250

## 2019-04-23 MED ORDER — FUROSEMIDE 10 MG/ML IJ SOLN
80.0000 mg | Freq: Once | INTRAMUSCULAR | Status: AC
  Administered 2019-04-23: 80 mg via INTRAVENOUS
  Filled 2019-04-23: qty 8

## 2019-04-23 MED ORDER — AMLODIPINE BESYLATE 5 MG PO TABS
5.0000 mg | ORAL_TABLET | Freq: Every day | ORAL | Status: DC
  Administered 2019-04-24 – 2019-04-28 (×5): 5 mg via ORAL
  Filled 2019-04-23 (×5): qty 1

## 2019-04-23 MED ORDER — PREDNISONE 20 MG PO TABS
40.0000 mg | ORAL_TABLET | Freq: Every day | ORAL | Status: DC
  Administered 2019-04-23 – 2019-04-24 (×2): 40 mg via ORAL
  Filled 2019-04-23 (×2): qty 2

## 2019-04-23 NOTE — Evaluation (Signed)
Physical Therapy Evaluation Patient Details Name: Nathan Grant MRN: [id number] DOB: March 14, 1946 Today's Date: 04/23/2019   History of Present Illness  74 year old male admitted with COVID- PMH includes HTN, CHronic DIastolic CHF, B/ LE DVT  Clinical Impression  Very pleasant male patient presents with severely reduced energy conservation, generalized weakness. He indicates that he was not aware of any heart problems prior to COVID, but with any change of position he tends to have very erratic heart rate and rhythyms. He is  reitred Programmer, applications. Lives with spouse in 2 story home, and was independent in all ADLS, IADLs. He has walk in shower with grab bars. No AD for ambulation and was not  Oxygen at home. Should benefit from skilled PT to address strengthening, energy conservation, and safety in ambulation.     Follow Up Recommendations Other (comment)(Could potentially benefit from CARDIO-Pulmonary REHAB if such is available for him.)    Equipment Recommendations  Rolling walker with 5" wheels    Recommendations for Other Services       Precautions / Restrictions Precautions Precautions: Fall Restrictions Weight Bearing Restrictions: No      Mobility  Bed Mobility Overal bed mobility: Needs Assistance Bed Mobility: Rolling;Sidelying to Sit;Supine to Sit;Sit to Supine;Sit to Sidelying Rolling: Min guard Sidelying to sit: Min guard Supine to sit: Min guard Sit to supine: Min assist Sit to sidelying: Min guard General bed mobility comments: He tends to have very erratic heart rate with any change of positons. He was able to sit on edge for 5 minutes- but the heart rate continued to fluctuate between 98-132 (RN aware)(Did not attempt any OOB transfers today secondary to cardiac concerns. (RN))  Transfers                    Ambulation/Gait                Stairs            Wheelchair Mobility    Modified Rankin (Stroke Patients  Only)       Balance                                             Pertinent Vitals/Pain Pain Assessment: No/denies pain    Home Living Family/patient expects to be discharged to:: Private residence Living Arrangements: Spouse/significant other Available Help at Discharge: Family;Available 24 hours/day   Home Access: Stairs to enter Entrance Stairs-Rails: Right Entrance Stairs-Number of Steps: 3 Home Layout: Two level Home Equipment: Grab bars - tub/shower      Prior Function Level of Independence: Independent         Comments: 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 Did not use any oxygen at home and until " he got sick with COVID, was not aware he had any heart problems"     Hand Dominance   Dominant Hand: Right    Extremity/Trunk Assessment   Upper Extremity Assessment Upper Extremity Assessment: Defer to OT evaluation    Lower Extremity Assessment Lower Extremity Assessment: Defer to PT evaluation;Generalized weakness    Cervical / Trunk Assessment Cervical / Trunk Assessment: Kyphotic  Communication   Communication: No difficulties  Cognition Arousal/Alertness: Awake/alert   Overall Cognitive Status: Within Functional Limits for tasks assessed  General Comments General comments (skin integrity, edema, etc.): Fair tone and turgor. Some edema noted in LE- RN explained to patient that they are going to give him Lasix, and will not remove his catheter "for awhile".    Exercises General Exercises - Lower Extremity Ankle Circles/Pumps: Supine Heel Slides: AROM;Supine Hip ABduction/ADduction: AROM;Supine Other Exercises Other Exercises: Practiced IS and able to achieve 1250 for 8 out of 10 attempts. Reminded to practice this every hour- at least 8-10 reps. Good return demo.   Assessment/Plan    PT Assessment    PT Problem List         PT Treatment  Interventions      PT Goals (Current goals can be found in the Care Plan section)  Acute Rehab PT Goals Patient Stated Goal: Just wants to get home as soon as safely possible but be as close to independent again as in PLOF PT Goal Formulation: With patient Time For Goal Achievement: 05/07/19 Potential to Achieve Goals: Good    Frequency     Barriers to discharge        Co-evaluation               AM-PAC PT "6 Clicks" Mobility  Outcome Measure Help needed turning from your back to your side while in a flat bed without using bedrails?: A Little Help needed moving from lying on your back to sitting on the side of a flat bed without using bedrails?: A Little Help needed moving to and from a bed to a chair (including a wheelchair)?: A Little Help needed standing up from a chair using your arms (e.g., wheelchair or bedside chair)?: A Little Help needed to walk in hospital room?: A Little Help needed climbing 3-5 steps with a railing? : A Lot 6 Click Score: 17    End of Session   Activity Tolerance: Patient limited by fatigue;Other (comment)(RN advised to monitor closely due to cardiac concerns. He runs very erratic heart rates any time he changes position.) Patient left: in bed        Time: 1115-1210 PT Time Calculation (min) (ACUTE ONLY): 55 min   Charges:   PT Evaluation $PT Eval Moderate Complexity: 1 Mod PT Treatments $Therapeutic Exercise: 8-22 mins $Therapeutic Activity: 23-37 mins        Rollen Sox, PT # [phone] CGV cell   Casandra Doffing 04/23/2019, 1:02 PM

## 2019-04-23 NOTE — Progress Notes (Signed)
[facility] Progress Note   74 y.o.malewith a history of CKD,HTN DM p/w dyspnea reqsupplemental oxygen foundto have CovidPNA as well as concurrent bacterial PNA and PE + DVT.Rx TPA on 1/7 and on highoxygen requirements.Patient had reported to the team that most of his edema was chronic.  Assessment/ Plan:   1)Acute renal failureon CKF IIIb BL Cr 1.8-2 -Secondary to ATN from Covid however also with prerenal insults at home. Appears to have been on lisinopril and losartanas well as spironolactone.Renal US without hydro and mildly increased echogenicity -No acute indication for dialysisand fortunate renal function improved today; hopefully that represents a trend. -continueholdinglisinopril, losartan, and spironolactone   - Gave another Lasix 120mg  IV x1 1/9 with great response; Lokelma already ordered.Double checked MAR and pt not on K replacement.  Consider giving Lasix 80mg  IV if needed; he has a great response to the 120mg .  Will sign off at this time; please reconsult as needed.  2)COVID-19 pneumonia -Therapies per primary team  3)Acute hypoxic respiratory failure -Supplemental oxygen -Treat Covid as above  4)HTN  - controlled  5)Chronic diastolic CHF - Lasix asneeded; gave another Lasix 120mg  IV x1 on 1/9  6)Bilateral lower extremity DVT  - anticoagulation per primary team Subjective:   Has appetite and today he states that the breathing is   improving Denies f/c/n/v. Tolerating PO's.  Asking about his brother Viann Shove who was hospitalized for a long time at [facility]; spouse told he had passed.   Objective:   BP (!) 167/87 (BP Location: Right Wrist)   Pulse 79   Temp (!) 97.5 F (36.4 C) (Oral)   Resp 17   Ht 6\' 1"  (1.854 m)   Wt (!) 138.3 kg   SpO2 95%   BMI 40.23 kg/m   Intake/Output Summary (Last 24 hours) at 04/23/2019 0933 Last data filed at 04/23/2019 0500 Gross per 24 hour  Intake 1460 ml   Output 2300 ml  Net -840 ml   Weight change:   Physical Exam: General:adult male in bed in NAD HEENT:NCAT Heart:RRR Lungs:clear  Abdomen:soft and nondistended with obese habitus Extremities:obese habitus with some chronic edema at least 1+, tender to palpation Neuro:alert and oriented, pleasant Skin - no rash  Psych normal mood and affect  Imaging: No results found.  Labs: BMET Recent Labs  Lab 04/19/19 1820 04/20/19 0307 04/21/19 0340 04/22/19 0423 04/23/19 0434  NA 134* 134* 135 137 138  K 5.2* 5.3* 5.0 5.6* 5.4*  CL 101 102 103 105 103  CO2 19* 17* 18* 15* 22  GLUCOSE 176* 123* 142* 105* 122*  BUN 77* 88* 111* 114* 111*  CREATININE 4.19* 4.48* 4.22* 4.05* 3.85*  CALCIUM 7.9* 7.8* 7.7* 7.7* 8.0*   CBC Recent Labs  Lab 04/20/19 0300 04/20/19 2000 04/21/19 0340 04/22/19 0423 04/23/19 0434  WBC 11.8* 17.3* 16.1* 19.5* 21.2*  NEUTROABS 9.7*  --  13.6* 16.0* 17.2*  HGB 9.1* 9.9* 9.1* 9.8* 10.1*  HCT 28.5* 30.7* 28.4* 30.1* 31.2*  MCV 69.0* 69.0* 68.4* 69.4* 68.7*  PLT 167 283 233 293 363    Medications:    . vitamin C  500 mg Oral Daily  . Chlorhexidine Gluconate Cloth  6 each Topical Daily  . insulin aspart  0-9 Units Subcutaneous TID WC  . pantoprazole  40 mg Oral Daily  . predniSONE  40 mg Oral Q breakfast  . sodium zirconium cyclosilicate  10 g Oral Daily  . zinc sulfate  220 mg Oral Daily  Otelia Santee, MD 04/23/2019, 9:33 AM

## 2019-04-23 NOTE — Progress Notes (Signed)
I called patients spouse and gave her an update on patient.  I answered her questions.  She thanked this nurse for calling.

## 2019-04-23 NOTE — Progress Notes (Signed)
PROGRESS NOTE                                                                                                                                                                                                             Patient Demographics:    Nathan Grant, is a 74 y.o. male, DOB - 03/25/1946, TDS:[id number]  Outpatient Primary MD for the patient is Forrest Moron, MD   Admit date - 04/19/2019   LOS - 4  Chief Complaint  Patient presents with  . Respiratory Distress       Brief Narrative: Patient is a 74 y.o. male with PMHx of HTN, DM-2, CKD stage IIIb, history of PAF-presented to the ED with 3-day history of shortness of breath-found to have acute hypoxic respiratory failure secondary to COVID-19 pneumonia along with AKI.  Patient was subsequently admitted to the hospitalist service at [facility].  Significant events: 1/7>> Doppler lower extremity positive for bilateral DVT 1/7>> TTE with clot in pulmonary artery with moderate RV dysfunction and severe pulmonary hypertension 1/7>> transferred to ICU given TPA 1/8>> oxygenation improved-transfer back to PCU    Subjective:   No major issues overnight-lying comfortably in bed-stable on 2 L of oxygen.   Assessment  & Plan :   Acute Hypoxic Resp Failure due to pulmonary embolism with RV strain and Covid 19 with concurrent bacterial pneumonia: Remarkably better post TPA-remains stable on 2 L of oxygen.  Continue IV heparin.  Will complete remdesivir on 1/10.  Stop Zithromax-continue Rocephin for a total of 5 days.   Fever: afebrile  O2 requirements:  SpO2: 95 % O2 Flow Rate (L/min): 1.5 L/min   COVID-19 Labs: Recent Labs    04/21/19 0340 04/22/19 0423 04/23/19 0434  DDIMER >20.00* >20.00* >20.00*  CRP 14.1* 8.3* 5.4*       Component Value Date/Time   BNP 413.0 (H) 04/19/2019 1820    Recent Labs  Lab 04/19/19 2003 04/20/19 0307    PROCALCITON 0.96 2.16    Lab Results  Component Value Date   SARSCOV2NAA Detected (A) 04/12/2019     COVID-19 Medications: Steroids: 1/6>> Remdesivir:1/6>>1/10  Other medications: Antibiotics: Rocephin: 1/7>> Zithromax: 1/7>>1/10  Prone/Incentive Spirometry: encouraged patient to lie prone for 3-4 hours at a time for a total of 16 hours a day, and to encourage incentive spirometry use 3-4/hour.  DVT Prophylaxis  :   Heparin gtt  AKI on CKD stage IIIb: Suspect this is hemodynamically mediated in the setting of acute infection with COVID-19 along with the use of Aldactone and losartan.  UA without proteinuria, renal ultrasound negative for hydronephrosis.  Making urine-creatinine is trending down-however BUN remains significantly elevated, continues to have mild hyperkalemia.  Nephrology recommending repeat Lasix-already on [facility].  Continue to follow-no indication for RRT-suspect renal function will continue to improve.    Hyperkalemia: Mild-repeating IV Lasix-already on [facility] tomorrow.  Pulmonary embolism with RV strain and bilateral lower extremity Doppler: Due to severe hypoxemia-underwent TPA infusion on 1/7-remains on IV heparin-with plans to transition to oral anticoagulation agent (if renal function continues to improve-and if felt not to require further procedures).    Note echo on 1/7 demonstrated a pulmonary embolus (clot seen in PA) with severe pulmonary hypertension.    HTN: BP slowly creeping up-we will go and start amlodipine.  DM-2 (A1c 6.4): Continue close monitoring with SSI-follow closely.  CBG (last 3)  Recent Labs    04/22/19 1209 04/22/19 1609 04/23/19 0735  GLUCAP 190* 190* 132*    Chronic diastolic heart failure: Remains compensated-has chronic-1+ pitting edema in the lower extremities-getting 1 dose of IV Lasix today.     PAF: Currently in sinus rhythm-anticoagulated for VTE.  Obesity: Estimated body mass index is 40.23 kg/m as  calculated from the following:   Height as of this encounter: 6\' 1"  (1.854 m).   Weight as of this encounter: 138.3 kg.    Consults  : Nephrology,PCCM  Procedures  :  None  ABG:    Component Value Date/Time   TCO2 28 10/03/2017 1025    Vent Settings: N/A  Condition -stable  Family Communication  : Spouse updated over the phone on 1/10  Code Status :  Full Code  Diet :  Diet Order            Diet Heart Room service appropriate? Yes; Fluid consistency: Thin  Diet effective now               Disposition Plan  :  Remain hospitalized-suspect discharge over the next few days if renal function improves significantly-suspect may require home health services.  Await PT/OT eval.  Barriers to discharge: Hypoxia requiring O2 supplementation/complete 5 days of IV Remdesivir  Antimicorbials  :    Anti-infectives (From admission, onward)   Start     Dose/Rate Route Frequency Ordered Stop   04/21/19 1600  remdesivir 100 mg in sodium chloride 0.9 % 100 mL IVPB     100 mg 200 mL/hr over 30 Minutes Intravenous Daily 04/20/19 1658 04/23/19 0936   04/21/19 1000  remdesivir 100 mg in sodium chloride 0.9 % 100 mL IVPB  Status:  Discontinued     100 mg 200 mL/hr over 30 Minutes Intravenous Daily 04/20/19 0525 04/20/19 1658   04/20/19 2000  remdesivir 100 mg in sodium chloride 0.9 % 100 mL IVPB     100 mg 200 mL/hr over 30 Minutes Intravenous  Once 04/20/19 1658 04/20/19 2026   04/20/19 1400  azithromycin (ZITHROMAX) 500 mg in sodium chloride 0.9 % 250 mL IVPB     500 mg 250 mL/hr over 60 Minutes Intravenous Every 24 hours 04/20/19 1308     04/20/19 1330  cefTRIAXone (ROCEPHIN) 1 g in sodium chloride 0.9 % 100 mL IVPB     1 g 200 mL/hr over 30 Minutes Intravenous Every 24 hours 04/20/19 1308 04/25/19 1329  04/20/19 1000  remdesivir 100 mg in sodium chloride 0.9 % 100 mL IVPB  Status:  Discontinued     100 mg 200 mL/hr over 30 Minutes Intravenous Daily 04/19/19 2015 04/20/19 0525    04/19/19 2200  remdesivir 200 mg in sodium chloride 0.9% 250 mL IVPB     200 mg 580 mL/hr over 30 Minutes Intravenous Once 04/19/19 2015 04/20/19 0302      Inpatient Medications  Scheduled Meds: . vitamin C  500 mg Oral Daily  . Chlorhexidine Gluconate Cloth  6 each Topical Daily  . insulin aspart  0-9 Units Subcutaneous TID WC  . pantoprazole  40 mg Oral Daily  . predniSONE  40 mg Oral Q breakfast  . sodium zirconium cyclosilicate  10 g Oral Daily  . zinc sulfate  220 mg Oral Daily   Continuous Infusions: . azithromycin Stopped (04/22/19 1700)  . cefTRIAXone (ROCEPHIN)  IV Stopped (04/22/19 1900)  . heparin 1,700 Units/hr (04/23/19 0908)   PRN Meds:.albuterol, hydrALAZINE, ondansetron **OR** ondansetron (ZOFRAN) IV, polyethylene glycol   Time Spent in minutes 35  See all Orders from today for further details   Oren Binet M.D on 04/23/2019 at 10:51 AM  To page go to [email or web address] - use universal password  [facility] -  Office  [phone]    Objective:   Vitals:   04/22/19 2000 04/23/19 0000 04/23/19 0400 04/23/19 0736  BP: 130/66 128/70  (!) 167/87  Pulse:    79  Resp:    17  Temp: 98.1 F (36.7 C) 97.9 F (36.6 C) 98.8 F (37.1 C) (!) 97.5 F (36.4 C)  TempSrc: Oral Oral Oral Oral  SpO2:    95%  Weight:      Height:        Wt Readings from Last 3 Encounters:  04/19/19 (!) 138.3 kg  03/07/19 (!) 149.7 kg  01/27/19 (!) 147.4 kg     Intake/Output Summary (Last 24 hours) at 04/23/2019 1051 Last data filed at 04/23/2019 0500 Gross per 24 hour  Intake 1460 ml  Output 2300 ml  Net -840 ml     Physical Exam Gen Exam:Alert awake-not in any distress HEENT:atraumatic, normocephalic Chest: B/L clear to auscultation anteriorly CVS:S1S2 regular Abdomen:soft non tender, non distended Extremities:+ edema Neurology: Non focal Skin: no rash   Data Review:    CBC Recent Labs  Lab 04/19/19 1820 04/20/19 0300 04/20/19 2000 04/21/19 0340  04/22/19 0423 04/23/19 0434  WBC 12.2* 11.8* 17.3* 16.1* 19.5* 21.2*  HGB 9.6* 9.1* 9.9* 9.1* 9.8* 10.1*  HCT 30.0* 28.5* 30.7* 28.4* 30.1* 31.2*  PLT 176 167 283 233 293 363  MCV 70.4* 69.0* 69.0* 68.4* 69.4* 68.7*  MCH 22.5* 22.0* 22.2* 21.9* 22.6* 22.2*  MCHC 32.0 31.9 32.2 32.0 32.6 32.4  RDW 16.4* 17.0* 16.9* 17.0* 17.1* 17.9*  LYMPHSABS 1.0 1.1  --  1.1 1.0 1.1  MONOABS 0.6 0.4  --  0.5 0.9 1.1*  EOSABS 0.0 0.0  --  0.0 0.0 0.0  BASOSABS 0.0 0.0  --  0.0 0.1 0.1    Chemistries  Recent Labs  Lab 04/19/19 1820 04/20/19 0307 04/21/19 0340 04/22/19 0423 04/23/19 0434  NA 134* 134* 135 137 138  K 5.2* 5.3* 5.0 5.6* 5.4*  CL 101 102 103 105 103  CO2 19* 17* 18* 15* 22  GLUCOSE 176* 123* 142* 105* 122*  BUN 77* 88* 111* 114* 111*  CREATININE 4.19* 4.48* 4.22* 4.05* 3.85*  CALCIUM 7.9* 7.8* 7.7* 7.7* 8.0*  MG  --   --   --   --  2.5*  AST 56* 73* 40 29 22  ALT 33 40 34 30 27  ALKPHOS 58 62 56 60 52  BILITOT 1.4* 1.3* 0.7 0.4 0.9   ------------------------------------------------------------------------------------------------------------------ No results for input(s): CHOL, HDL, LDLCALC, TRIG, CHOLHDL, LDLDIRECT in the last 72 hours.  Lab Results  Component Value Date   HGBA1C 6.4 (A) 03/07/2019   ------------------------------------------------------------------------------------------------------------------ No results for input(s): TSH, T4TOTAL, T3FREE, THYROIDAB in the last 72 hours.  Invalid input(s): FREET3 ------------------------------------------------------------------------------------------------------------------ No results for input(s): VITAMINB12, FOLATE, FERRITIN, TIBC, IRON, RETICCTPCT in the last 72 hours.  Coagulation profile Recent Labs  Lab 04/19/19 2017 04/20/19 2000  INR 1.6* 2.1*    Recent Labs    04/22/19 0423 04/23/19 0434  DDIMER >20.00* >20.00*    Cardiac Enzymes No results for input(s): CKMB, TROPONINI, MYOGLOBIN in the  last 168 hours.  Invalid input(s): CK ------------------------------------------------------------------------------------------------------------------    Component Value Date/Time   BNP 413.0 (H) 04/19/2019 1820    Micro Results Recent Results (from the past 240 hour(s))  Blood culture (routine x 2)     Status: None (Preliminary result)   Collection Time: 04/19/19  6:20 PM   Specimen: BLOOD  Result Value Ref Range Status   Specimen Description   Final    BLOOD RIGHT ANTECUBITAL Performed at [facility] [address] [place], [place] [address]    Special Requests   Final    BOTTLES DRAWN AEROBIC ONLY Blood Culture adequate volume   Culture   Final    NO GROWTH 3 DAYS Performed at [facility], [place] [address] [place], [place] [address]    Report Status PENDING  Incomplete  Blood culture (routine x 2)     Status: None (Preliminary result)   Collection Time: 04/19/19  6:35 PM   Specimen: BLOOD  Result Value Ref Range Status   Specimen Description   Final    BLOOD LEFT ANTECUBITAL Performed at [facility] [address], [place], [place] [address]    Special Requests   Final    BOTTLES DRAWN AEROBIC ONLY Blood Culture adequate volume   Culture   Final    NO GROWTH 3 DAYS Performed at [facility], [place] [address] [place], [place] [address]    Report Status PENDING  Incomplete  MRSA PCR Screening     Status: None   Collection Time: 04/21/19  4:24 AM   Specimen: Nasal Mucosa; Nasopharyngeal  Result Value Ref Range Status   MRSA by PCR NEGATIVE NEGATIVE Final    Comment:        The GeneXpert MRSA Assay (FDA approved for NASAL specimens only), is one component of a comprehensive MRSA colonization surveillance program. It is not intended to diagnose MRSA infection nor to guide or monitor treatment for MRSA infections. Performed at [facility], [place] [address] [place], [place] [address]      Radiology Reports US Renal  Result Date: 04/20/2019 CLINICAL DATA:  74 year old male with a history of acute kidney injury EXAM: RENAL / URINARY TRACT ULTRASOUND COMPLETE COMPARISON:  None. FINDINGS: Right Kidney: Length: 11.7 cm x 4.8 cm x 6.1 cm, 181 cc. Echogenicity of the right renal cortex relatively increased. No hydronephrosis. Flow confirmed in the hilum of the right kidney. No focal echogenic focus. Left Kidney: Length: 10.5 cm x 6.4 cm x 5.4 cm, 190 cc. Echogenicity of the left kidney relatively  increased. No hydronephrosis. No focal echogenic focus. Flow confirmed in the hilum. Bladder: Bladder partially distended, otherwise unremarkable IMPRESSION: Negative for hydronephrosis. Mildly increased echogenicity of the bilateral kidneys, indicating medical renal disease. Electronically Signed   By: Corrie Mckusick D.O.   On: 04/20/2019 11:16   DG Chest Portable 1 View  Result Date: 04/19/2019 CLINICAL DATA:  Hypoxia. EXAM: PORTABLE CHEST 1 VIEW COMPARISON:  December 09, 2013 FINDINGS: Mild hazy infiltrates are seen within the bilateral lung bases. There is no evidence of a pleural effusion or pneumothorax. The cardiac silhouette is mildly enlarged. The visualized skeletal structures are unremarkable. IMPRESSION: Mild hazy bibasilar infiltrates. Electronically Signed   By: Virgina Norfolk M.D.   On: 04/19/2019 18:54   ECHOCARDIOGRAM COMPLETE  Result Date: 04/20/2019   ECHOCARDIOGRAM REPORT   Patient Name:   Nathan Grant Date of Exam: 04/20/2019 Medical Rec #:  [id number]        Height:       73.0 in Accession #:    [id number]       Weight:       304.9 lb Date of Birth:  12-01-1945         BSA:          2.57 m Patient Age:    49 years         BP:           127/70 mmHg Patient Gender: M                HR:           86 bpm. Exam Location:  Inpatient Procedure: 2D Echo, Cardiac Doppler and Color Doppler Indications:    R06.02 SOB  History:        Patient has prior history of Echocardiogram examinations,  most                 recent 05/29/2012. Abnormal ECG, Pulmonary HTN, Arrythmias:Atrial                 Flutter and NSVT, Signs/Symptoms:Chest Pain and Dyspnea; Risk                 Factors:Sleep Apnea, Hypertension, Diabetes and Dyslipidemia.                 Covid 19 positive.  Sonographer:    Roseanna Rainbow RDCS Referring Phys: [place]  Sonographer Comments: Technically difficult study due to poor echo windows and patient is morbidly obese. Image acquisition challenging due to patient body habitus. IMPRESSIONS  1. Normal LVEF with severe pulmonary hypertension and likely PA thrombus in right PA, suggestive of pulmonary embolism. RV is only midly enlarged. Findings communicated with Dr. Sloan Leiter.  2. Left ventricular ejection fraction, by visual estimation, is 65 to 70%. The left ventricle has hyperdynamic function. There is severely increased left ventricular hypertrophy.  3. Right ventricular pressure overload.  4. The left ventricle has no regional wall motion abnormalities.  5. Global right ventricle has moderately reduced systolic function.The right ventricular size is mildly enlarged. Right vetricular wall thickness was not assessed.  6. Preserved RV apical function, consistent with McConnell's sign. RV size normal to only slightly enlarged depending on imaging window.  7. Left atrial size was mildly dilated.  8. Right atrial size was moderately dilated.  9. Small pericardial effusion. 10. The pericardial effusion is circumferential. 11. The mitral valve is normal in structure. Trivial mitral valve regurgitation. 12. The tricuspid valve is normal in structure.  13. The aortic valve is tricuspid. Aortic valve regurgitation is trivial. Mild aortic valve sclerosis without stenosis. 14. Pulmonic regurgitation is moderate. 15. The pulmonic valve was grossly normal. Pulmonic valve regurgitation is moderate. 16. Severely elevated pulmonary artery systolic pressure. 1. Suspect thromboembolism in the main  pulmonary artery. 18. There is a mobile mass seen in the right PA highly suspicious for thrombus. Additional thrombus burden not excluded. 19. The tricuspid regurgitant velocity is 3.74 m/s, and with an assumed right atrial pressure of 15 mmHg, the estimated right ventricular systolic pressure is severely elevated at 70.8 mmHg. 20. The inferior vena cava is dilated in size with <50% respiratory variability, suggesting right atrial pressure of 15 mmHg. FINDINGS  Left Ventricle: Left ventricular ejection fraction, by visual estimation, is 65 to 70%. The left ventricle has hyperdynamic function. The left ventricle has no regional wall motion abnormalities. There is severely increased left ventricular hypertrophy.  Concentric left ventricular hypertrophy. The interventricular septum is flattened in systole, consistent with right ventricular pressure overload. Right Ventricle: The right ventricular size is mildly enlarged. Right vetricular wall thickness was not assessed. Global RV systolic function is has moderately reduced systolic function. The tricuspid regurgitant velocity is 3.74 m/s, and with an assumed  right atrial pressure of 15 mmHg, the estimated right ventricular systolic pressure is severely elevated at 70.8 mmHg. Preserved RV apical function, consistent with McConnell's sign. RV size normal to only slightly enlarged depending on imaging window. Left Atrium: Left atrial size was mildly dilated. Right Atrium: Right atrial size was moderately dilated Pericardium: A small pericardial effusion is present. The pericardial effusion is circumferential. Mitral Valve: The mitral valve is normal in structure. Trivial mitral valve regurgitation. Tricuspid Valve: The tricuspid valve is normal in structure. Tricuspid valve regurgitation moderate. Aortic Valve: The aortic valve is tricuspid. . There is mild thickening and mild calcification of the aortic valve. Aortic valve regurgitation is trivial. Mild aortic valve  sclerosis is present, with no evidence of aortic valve stenosis. There is mild thickening of the aortic valve. There is mild calcification of the aortic valve. Pulmonic Valve: The pulmonic valve was grossly normal. Pulmonic valve regurgitation is moderate. Pulmonic regurgitation is moderate. Aorta: The aortic root, ascending aorta and aortic arch are all structurally normal, with no evidence of dilitation or obstruction. Pulmonary Artery: Suspect thromboembolism in the main pulmonary artery. There is a mobile mass seen in the right PA highly suspicious for thrombus. Additional thrombus burden not excluded. Venous: The inferior vena cava is dilated in size with less than 50% respiratory variability, suggesting right atrial pressure of 15 mmHg. IAS/Shunts: No atrial level shunt detected by color flow Doppler.  LEFT VENTRICLE PLAX 2D LVIDd:         4.29 cm LVIDs:         2.34 cm LV PW:         2.08 cm LV IVS:        2.18 cm LVOT diam:     2.00 cm LV SV:         64 ml LV SV Index:   23.37 LVOT Area:     3.14 cm  LV Volumes (MOD) LV area d, A2C:    28.70 cm LV area d, A4C:    22.30 cm LV area s, A2C:    15.20 cm LV area s, A4C:    12.20 cm LV major d, A2C:   6.53 cm LV major d, A4C:   7.35 cm LV major s, A2C:  5.76 cm LV major s, A4C:   5.75 cm LV vol d, MOD A2C: 104.0 ml LV vol d, MOD A4C: 59.9 ml LV vol s, MOD A2C: 34.7 ml LV vol s, MOD A4C: 23.8 ml LV SV MOD A2C:     69.3 ml LV SV MOD A4C:     59.9 ml LV SV MOD BP:      54.1 ml RIGHT VENTRICLE         IVC TAPSE (M-mode): 1.2 cm  IVC diam: 2.23 cm LEFT ATRIUM             Index       RIGHT ATRIUM           Index LA diam:        4.40 cm 1.71 cm/m  RA Area:     29.40 cm LA Vol (A2C):   93.0 ml 36.13 ml/m RA Volume:   108.00 ml 41.96 ml/m LA Vol (A4C):   77.6 ml 30.15 ml/m LA Biplane Vol: 94.3 ml 36.64 ml/m  AORTIC VALVE             PULMONIC VALVE LVOT Vmax:   149.00 cm/s PR End Diast Vel: 2.12 msec LVOT Vmean:  95.400 cm/s LVOT VTI:    0.197 m  AORTA Ao Asc  diam: 3.20 cm MITRAL VALVE                        TRICUSPID VALVE MV Area (PHT): 4.78 cm             TR Peak grad:   55.8 mmHg MV PHT:        46.01 msec           TR Vmax:        387.00 cm/s MV Decel Time: 159 msec MV E velocity: 103.53 cm/s 103 cm/s SHUNTS                                     Systemic VTI:  0.20 m                                     Systemic Diam: 2.00 cm  Buford Dresser MD Electronically signed by Buford Dresser MD Signature Date/Time: 04/20/2019/6:30:29 PM    Final    VAS [place] LOWER EXTREMITY VENOUS (DVT)  Result Date: 04/20/2019  Lower Venous Study Indications: Covid-19, elevated D-Dimer.  Limitations: Body habitus. Comparison Study: No prior study on file Performing Technologist: Sharion Dove RVS  Examination Guidelines: A complete evaluation includes B-mode imaging, spectral Doppler, color Doppler, and power Doppler as needed of all accessible portions of each vessel. Bilateral testing is considered an integral part of a complete examination. Limited examinations for reoccurring indications may be performed as noted.  +---------+---------------+---------+-----------+----------+--------------+ RIGHT    CompressibilityPhasicitySpontaneityPropertiesThrombus Aging +---------+---------------+---------+-----------+----------+--------------+ CFV      Full           Yes      Yes                                 +---------+---------------+---------+-----------+----------+--------------+ SFJ      Full                                                        +---------+---------------+---------+-----------+----------+--------------+  FV Prox  Full                                                        +---------+---------------+---------+-----------+----------+--------------+ FV Mid   Full                                                        +---------+---------------+---------+-----------+----------+--------------+ FV DistalFull                                                         +---------+---------------+---------+-----------+----------+--------------+ PFV      Full                                                        +---------+---------------+---------+-----------+----------+--------------+ POP      Partial                                      Acute          +---------+---------------+---------+-----------+----------+--------------+ PTV      None                                         Acute          +---------+---------------+---------+-----------+----------+--------------+ PERO                                                  Not visualized +---------+---------------+---------+-----------+----------+--------------+ Gastroc  None                                         Acute          +---------+---------------+---------+-----------+----------+--------------+   +---------+---------------+---------+-----------+----------+--------------+ LEFT     CompressibilityPhasicitySpontaneityPropertiesThrombus Aging +---------+---------------+---------+-----------+----------+--------------+ CFV      Full           Yes      Yes                                 +---------+---------------+---------+-----------+----------+--------------+ SFJ      Full                                                        +---------+---------------+---------+-----------+----------+--------------+ FV Prox  Full                                                        +---------+---------------+---------+-----------+----------+--------------+  FV Mid   Full                                                        +---------+---------------+---------+-----------+----------+--------------+ FV DistalFull                                                        +---------+---------------+---------+-----------+----------+--------------+ PFV      Full                                                         +---------+---------------+---------+-----------+----------+--------------+ POP      None                                         Acute          +---------+---------------+---------+-----------+----------+--------------+ PTV      None                                         Acute          +---------+---------------+---------+-----------+----------+--------------+ PERO                                                  Not visualized +---------+---------------+---------+-----------+----------+--------------+ Gastroc  None                                         Acute          +---------+---------------+---------+-----------+----------+--------------+     Summary: Right: Findings consistent with acute deep vein thrombosis involving the right popliteal vein, right posterior tibial veins, and right gastrocnemius veins. Left: Findings consistent with acute deep vein thrombosis involving the left popliteal vein, left posterior tibial veins, and left gastrocnemius veins.  *See table(s) above for measurements and observations. Electronically signed by Monica Martinez MD on 04/20/2019 at 5:10:07 PM.    Final

## 2019-04-23 NOTE — Progress Notes (Signed)
[facility] for heparin Indication: pulmonary embolus, b/l DVTs  Allergies  Allergen Reactions  . Ibuprofen   . Tylenol [Acetaminophen]     Patient Measurements: Height: 6\' 1"  (185.4 cm) Weight: (!) 304 lb 14.3 oz (138.3 kg) IBW/kg (Calculated) : 79.9 Heparin Dosing Weight: 115kg (last wt 149.7kg on 03/07/19)  Vital Signs: Temp: 97.5 F (36.4 C) (01/10 0736) Temp Source: Oral (01/10 0736) BP: 167/87 (01/10 0736) Pulse Rate: 79 (01/10 0736)  Labs: Recent Labs    04/20/19 2000 04/20/19 2000 04/21/19 0340 04/22/19 0423 04/22/19 1808 04/23/19 0434  HGB 9.9*  --  9.1* 9.8*  --  10.1*  HCT 30.7*  --  28.4* 30.1*  --  31.2*  PLT 283  --  233 293  --  363  APTT 55*  --   --   --   --   --   LABPROT 23.5*  --   --   --   --   --   INR 2.1*  --   --   --   --   --   HEPARINUNFRC  --    < >  --  0.11* 0.80* 1.13*  CREATININE  --   --  4.22* 4.05*  --  3.85*   < > = values in this interval not displayed.    Estimated Creatinine Clearance: 24.6 mL/min (A) (by C-G formula based on SCr of 3.85 mg/dL (H)).   Assessment: [name].  Pharmacy consulted to dose heparin for PE.  No prior AC.  Heparin level therapeutic on 2250 units/hr 1/7 pm. Decision made to treat with lytic - TPA 100mg  given 1/7 2030.  Heparin level supratherapeutic this morning at 1.13 on 1900 units/hr. Spoke with phlebotomy and level drawn from opposite arm as heparin infusion; also confirmed with RN. No problems with infusion or bleeding noted. Hgb stable in 9-10s, platelets are normal. Renal function is improving.  Goal of Therapy: Heparin level 0.3-0.7 units/ml  Monitor platelets by anticoagulation protocol   Plan:  Hold heparin drip for 1 hr then decrease the rate to 1700 units/hr 8h HL after resuming Monitor daily HL, CBC and s/s of bleeding  Discussed plan with RN  Thank you for involving pharmacy in this  patient's care.  Renold Genta, PharmD, BCPS Clinical Pharmacist Clinical phone for 04/23/2019 until 3:30p is [phone] 04/23/2019 8:10 AM  **Pharmacist phone directory can be found on [email or web address] listed under [facility]**

## 2019-04-23 NOTE — Progress Notes (Signed)
[place] for heparin Indication: pulmonary embolus, b/l DVTs  Allergies  Allergen Reactions  . Ibuprofen   . Tylenol [Acetaminophen]     Patient Measurements: Height: 6\' 1"  (185.4 cm) Weight: (!) 304 lb 14.3 oz (138.3 kg) IBW/kg (Calculated) : 79.9 Heparin Dosing Weight: 115kg (last wt 149.7kg on 03/07/19)  Vital Signs: Temp: 97.9 F (36.6 C) (01/10 1546) Temp Source: Axillary (01/10 1546) BP: 134/87 (01/10 1546) Pulse Rate: 96 (01/10 1546)  Labs: Recent Labs    04/20/19 2000 04/20/19 2000 04/21/19 0340 04/22/19 0423 04/22/19 1808 04/23/19 0434 04/23/19 1705  HGB 9.9*  --  9.1* 9.8*  --  10.1*  --   HCT 30.7*  --  28.4* 30.1*  --  31.2*  --   PLT 283  --  233 293  --  363  --   APTT 55*  --   --   --   --   --   --   LABPROT 23.5*  --   --   --   --   --   --   INR 2.1*  --   --   --   --   --   --   HEPARINUNFRC  --    < >  --  0.11* 0.80* 1.13* 0.90*  CREATININE  --   --  4.22* 4.05*  --  3.85*  --    < > = values in this interval not displayed.    Estimated Creatinine Clearance: 24.6 mL/min (A) (by C-G formula based on SCr of 3.85 mg/dL (H)).   Assessment: [name].  Pharmacy consulted to dose heparin for PE.  No prior AC.  Heparin level therapeutic on 2250 units/hr 1/7 pm. Decision made to treat with lytic - TPA 100mg  given 1/7 2030.  Heparin level supratherapeutic this morning at 1.13 on 1900 units/hr. Spoke with phlebotomy and level drawn from opposite arm as heparin infusion; also confirmed with RN. No problems with infusion or bleeding noted. Hgb stable in 9-10s, platelets are normal. Renal function is improving.  Heparin level still came back supratherapeutic this PM. We will hold it for a short period and decrease the dose further.   Goal of Therapy: Heparin level 0.3-0.7 units/ml  Monitor platelets by anticoagulation protocol   Plan:  Hold heparin  drip for 30 minutes then decrease rate to 1500 units/hr F/u with level in AM Monitor daily HL, CBC and s/s of bleeding   Onnie Boer, PharmD, [place], AAHIVP, CPP Infectious Disease Pharmacist 04/23/2019 6:44 PM

## 2019-04-24 LAB — COMPREHENSIVE METABOLIC PANEL
ALT: 24 U/L (ref 0–44)
AST: 17 U/L (ref 15–41)
Albumin: 2.6 g/dL — ABNORMAL LOW (ref 3.5–5.0)
Alkaline Phosphatase: 45 U/L (ref 38–126)
Anion gap: 15 (ref 5–15)
BUN: 124 mg/dL — ABNORMAL HIGH (ref 8–23)
CO2: 21 mmol/L — ABNORMAL LOW (ref 22–32)
Calcium: 7.6 mg/dL — ABNORMAL LOW (ref 8.9–10.3)
Chloride: 102 mmol/L (ref 98–111)
Creatinine, Ser: 3.71 mg/dL — ABNORMAL HIGH (ref 0.61–1.24)
GFR calc Af Amer: 18 mL/min — ABNORMAL LOW (ref >60)
GFR calc non Af Amer: 15 mL/min — ABNORMAL LOW (ref >60)
Glucose, Bld: 108 mg/dL — ABNORMAL HIGH (ref 70–99)
Potassium: 4.7 mmol/L (ref 3.5–5.1)
Sodium: 138 mmol/L (ref 135–145)
Total Bilirubin: 0.7 mg/dL (ref 0.3–1.2)
Total Protein: 5.9 g/dL — ABNORMAL LOW (ref 6.5–8.1)

## 2019-04-24 LAB — CBC WITH DIFFERENTIAL/PLATELET
Abs Immature Granulocytes: 1.56 10*3/uL — ABNORMAL HIGH (ref 0.00–0.07)
Basophils Absolute: 0.1 10*3/uL (ref 0.0–0.1)
Basophils Relative: 1 %
Eosinophils Absolute: 0 10*3/uL (ref 0.0–0.5)
Eosinophils Relative: 0 %
HCT: 30.9 % — ABNORMAL LOW (ref 39.0–52.0)
Hemoglobin: 10 g/dL — ABNORMAL LOW (ref 13.0–17.0)
Immature Granulocytes: 8 %
Lymphocytes Relative: 6 %
Lymphs Abs: 1.1 10*3/uL (ref 0.7–4.0)
MCH: 22.3 pg — ABNORMAL LOW (ref 26.0–34.0)
MCHC: 32.4 g/dL (ref 30.0–36.0)
MCV: 68.8 fL — ABNORMAL LOW (ref 80.0–100.0)
Monocytes Absolute: 1.1 10*3/uL — ABNORMAL HIGH (ref 0.1–1.0)
Monocytes Relative: 6 %
Neutro Abs: 15.1 10*3/uL — ABNORMAL HIGH (ref 1.7–7.7)
Neutrophils Relative %: 79 %
Platelets: 377 10*3/uL (ref 150–400)
RBC: 4.49 MIL/uL (ref 4.22–5.81)
RDW: 18.6 % — ABNORMAL HIGH (ref 11.5–15.5)
WBC: 19 10*3/uL — ABNORMAL HIGH (ref 4.0–10.5)
nRBC: 0.5 % — ABNORMAL HIGH (ref 0.0–0.2)

## 2019-04-24 LAB — CULTURE, BLOOD (ROUTINE X 2)
Culture: NO GROWTH
Culture: NO GROWTH
Special Requests: ADEQUATE
Special Requests: ADEQUATE

## 2019-04-24 LAB — HEPARIN LEVEL (UNFRACTIONATED)
Heparin Unfractionated: 0.51 IU/mL (ref 0.30–0.70)
Heparin Unfractionated: 0.61 IU/mL (ref 0.30–0.70)

## 2019-04-24 LAB — C-REACTIVE PROTEIN: CRP: 3.5 mg/dL — ABNORMAL HIGH (ref <1.0)

## 2019-04-24 LAB — GLUCOSE, CAPILLARY
Glucose-Capillary: 163 mg/dL — ABNORMAL HIGH (ref 70–99)
Glucose-Capillary: 107 mg/dL — ABNORMAL HIGH (ref 70–99)
Glucose-Capillary: 158 mg/dL — ABNORMAL HIGH (ref 70–99)
Glucose-Capillary: 170 mg/dL — ABNORMAL HIGH (ref 70–99)
Glucose-Capillary: 172 mg/dL — ABNORMAL HIGH (ref 70–99)

## 2019-04-24 LAB — D-DIMER, QUANTITATIVE: D-Dimer, Quant: >20 ug/mL-FEU — ABNORMAL HIGH (ref 0.00–0.50)

## 2019-04-24 MED ORDER — OXYMETAZOLINE HCL 0.05 % NA SOLN
3.0000 | Freq: Two times a day (BID) | NASAL | Status: DC | PRN
  Administered 2019-04-24: 3 via NASAL
  Filled 2019-04-24: qty 15

## 2019-04-24 MED ORDER — SALINE SPRAY 0.65 % NA SOLN
2.0000 | NASAL | Status: DC | PRN
  Administered 2019-04-24 – 2019-04-25 (×2): 2 via NASAL
  Filled 2019-04-24: qty 44

## 2019-04-24 NOTE — Progress Notes (Signed)
[place] for heparin Indication: pulmonary embolus, b/l DVTs  Allergies  Allergen Reactions  . Ibuprofen   . Tylenol [Acetaminophen]     Patient Measurements: Height: 6\' 1"  (185.4 cm) Weight: (!) 304 lb 14.3 oz (138.3 kg) IBW/kg (Calculated) : 79.9 Heparin Dosing Weight: 115kg (last wt 149.7kg on 03/07/19)  Vital Signs: Temp: 97.7 F (36.5 C) (01/11 0400) Temp Source: Oral (01/11 0400) BP: 156/60 (01/11 0400) Pulse Rate: 74 (01/11 0000)  Labs: Recent Labs    04/22/19 0423 04/23/19 0434 04/23/19 1705 04/24/19 0505  HGB 9.8* 10.1*  --  10.0*  HCT 30.1* 31.2*  --  30.9*  PLT 293 363  --  377  HEPARINUNFRC 0.11* 1.13* 0.90* 0.61  CREATININE 4.05* 3.85*  --  3.71*    Estimated Creatinine Clearance: 25.5 mL/min (A) (by C-G formula based on SCr of 3.71 mg/dL (H)).   Assessment: [name].  Pharmacy consulted to dose heparin for PE.  No prior AC.  Heparin level therapeutic on 2250 units/hr 1/7 pm. Decision made to treat with lytic - TPA 100mg  given 1/7 2030.  Heparin level supratherapeutic this morning at 1.13 on 1900 units/hr. Spoke with phlebotomy and level drawn from opposite arm as heparin infusion; also confirmed with RN. No problems with infusion or bleeding noted. Hgb stable in 9-10s, platelets are normal. Renal function is improving.  Heparin level at goal this AM.  No bleeding per RN.   Goal of Therapy: Heparin level 0.3-0.7 units/ml  Monitor platelets by anticoagulation protocol   Plan:  Continue heparin infusion at 1500 units/hr F/u with level in 8 h Monitor daily HL, CBC and s/s of bleeding   Ulice Dash, PharmD, BCPS 04/24/2019 7:47 AM

## 2019-04-24 NOTE — Progress Notes (Signed)
PROGRESS NOTE                                                                                                                                                                                                             Patient Demographics:    Nathan Grant, is a 74 y.o. male, DOB - August 04, 1945, OEU:[id number]  Outpatient Primary MD for the patient is Forrest Moron, MD   Admit date - 04/19/2019   LOS - 5  Chief Complaint  Patient presents with  . Respiratory Distress       Brief Narrative: Patient is a 74 y.o. male with PMHx of HTN, DM-2, CKD stage IIIb, history of PAF-presented to the ED with 3-day history of shortness of breath-found to have acute hypoxic respiratory failure secondary to COVID-19 pneumonia along with AKI.  Patient was subsequently admitted to the hospitalist service at [facility].  Significant events: 1/7>> Doppler lower extremity positive for bilateral DVT 1/7>> TTE with clot in pulmonary artery with moderate RV dysfunction and severe pulmonary hypertension 1/7>> transferred to ICU given TPA 1/8>> oxygenation improved-transfer back to PCU    Subjective:   Continues to slowly improve-no major issues overnight.  When I was in the room-he was working with occupational therapy who had just removed his oxygen-his O2 saturation was in the low 90s.  Foley catheter was removed yesterday-he is voiding spontaneously.   Assessment  & Plan :   Acute Hypoxic Resp Failure due to pulmonary embolism with RV strain and Covid 19 with concurrent bacterial pneumonia: Significant improvement post TPA-very minimal oxygen requirement.  Continue IV heparin for Rocephin and steroids.  Patient has completed a course of remdesivir on 1/10.    Fever: afebrile  O2 requirements:  SpO2: 98 % O2 Flow Rate (L/min): 2 L/min   COVID-19 Labs: Recent Labs    04/22/19 0423 04/23/19 0434 04/24/19 0505  DDIMER  >20.00* >20.00* >20.00*  CRP 8.3* 5.4* 3.5*       Component Value Date/Time   BNP 413.0 (H) 04/19/2019 1820    Recent Labs  Lab 04/19/19 2003 04/20/19 0307  PROCALCITON 0.96 2.16    Lab Results  Component Value Date   SARSCOV2NAA Detected (A) 04/12/2019     COVID-19 Medications: Steroids: 1/6>> Remdesivir:1/6>>1/10  Other medications: Antibiotics: Rocephin: 1/7>> Zithromax: 1/7>>1/10  Prone/Incentive Spirometry: encouraged patient  to lie prone for 3-4 hours at a time for a total of 16 hours a day, and to encourage incentive spirometry use 3-4/hour.  DVT Prophylaxis  :   Heparin gtt  AKI on CKD stage IIIb: Suspect this is hemodynamically mediated in the setting of acute infection with COVID-19 along with the use of Aldactone and losartan.  UA without proteinuria, renal ultrasound negative for hydronephrosis.  Slowly improving-patient is making good urine.  Although creatinine is slowly downtrending-BUN remains significantly elevated (on steroids/did receive Lasix for the past few days).continue to follow closely-even though BUN elevated-patient is mentating well-does not have any signs/symptoms of uremia.  Currently no indication for RRT.  Continue supportive care.  Hold Lasix today.    Hyperkalemia: Resolved-continue Lokelma for a few more days.  Pulmonary embolism with RV strain and bilateral lower extremity Doppler: Due to severe hypoxemia-underwent TPA infusion on 1/7-remains on IV heparin-with plans to transition to oral anticoagulation agent (if renal function continues to improve-and if felt not to require further procedures).    Note echo on 1/7 demonstrated a pulmonary embolus (clot seen in PA) with severe pulmonary hypertension.    HTN: BP fluctuating-restarted on amlodipine on 1/10-continue-follow and optimize over the next 2 days.    DM-2 (A1c 6.4): Continue close monitoring with SSI-follow closely.  CBG (last 3)  Recent Labs    04/23/19 1606 04/23/19 1956  04/24/19 0818  GLUCAP 167* 166* 107*    Chronic diastolic heart failure: Remains compensated-has chronic-1+ pitting edema in the lower extremities-hold Lasix today.   PAF: Currently in sinus rhythm-anticoagulated for VTE.  Obesity: Estimated body mass index is 40.23 kg/m as calculated from the following:   Height as of this encounter: 6\' 1"  (1.854 m).   Weight as of this encounter: 138.3 kg.    Consults  : Nephrology,PCCM  Procedures  :  None  ABG:    Component Value Date/Time   TCO2 28 10/03/2017 1025    Vent Settings: N/A  Condition -stable  Family Communication  : Spouse updated over the phone on 1/10  Code Status :  Full Code  Diet :  Diet Order            Diet Heart Room service appropriate? Yes; Fluid consistency: Thin  Diet effective now               Disposition Plan  :  Remain hospitalized-Home with home health services when closer to discharge.  Barriers to discharge: Hypoxia requiring O2 supplementation  Antimicorbials  :    Anti-infectives (From admission, onward)   Start     Dose/Rate Route Frequency Ordered Stop   04/21/19 1600  remdesivir 100 mg in sodium chloride 0.9 % 100 mL IVPB     100 mg 200 mL/hr over 30 Minutes Intravenous Daily 04/20/19 1658 04/23/19 1105   04/21/19 1000  remdesivir 100 mg in sodium chloride 0.9 % 100 mL IVPB  Status:  Discontinued     100 mg 200 mL/hr over 30 Minutes Intravenous Daily 04/20/19 0525 04/20/19 1658   04/20/19 2000  remdesivir 100 mg in sodium chloride 0.9 % 100 mL IVPB     100 mg 200 mL/hr over 30 Minutes Intravenous  Once 04/20/19 1658 04/20/19 2026   04/20/19 1400  azithromycin (ZITHROMAX) 500 mg in sodium chloride 0.9 % 250 mL IVPB  Status:  Discontinued     500 mg 250 mL/hr over 60 Minutes Intravenous Every 24 hours 04/20/19 1308 04/23/19 1055   04/20/19 1330  cefTRIAXone (ROCEPHIN) 1 g in sodium chloride 0.9 % 100 mL IVPB     1 g 200 mL/hr over 30 Minutes Intravenous Every 24 hours 04/20/19  1308 04/25/19 1329   04/20/19 1000  remdesivir 100 mg in sodium chloride 0.9 % 100 mL IVPB  Status:  Discontinued     100 mg 200 mL/hr over 30 Minutes Intravenous Daily 04/19/19 2015 04/20/19 0525   04/19/19 2200  remdesivir 200 mg in sodium chloride 0.9% 250 mL IVPB     200 mg 580 mL/hr over 30 Minutes Intravenous Once 04/19/19 2015 04/20/19 0302      Inpatient Medications  Scheduled Meds: . amLODipine  5 mg Oral Daily  . vitamin C  500 mg Oral Daily  . Chlorhexidine Gluconate Cloth  6 each Topical Daily  . insulin aspart  0-9 Units Subcutaneous TID WC  . pantoprazole  40 mg Oral Daily  . predniSONE  40 mg Oral Q breakfast  . sodium zirconium cyclosilicate  10 g Oral Daily  . zinc sulfate  220 mg Oral Daily   Continuous Infusions: . cefTRIAXone (ROCEPHIN)  IV Stopped (04/23/19 1900)  . heparin 1,500 Units/hr (04/24/19 0750)   PRN Meds:.albuterol, hydrALAZINE, ondansetron **OR** ondansetron (ZOFRAN) IV, polyethylene glycol   Time Spent in minutes 35  See all Orders from today for further details   Oren Binet M.D on 04/24/2019 at 11:47 AM  To page go to [email or web address] - use universal password  [facility] -  Office  [phone]    Objective:   Vitals:   04/23/19 2000 04/24/19 0000 04/24/19 0400 04/24/19 0844  BP: 131/69 (!) 150/61 (!) 156/60 (!) 156/83  Pulse: 92 74    Resp: (!) 23 (!) 22 20   Temp: 97.6 F (36.4 C) 98.1 F (36.7 C) 97.7 F (36.5 C)   TempSrc: Axillary Oral Oral   SpO2: 96% 98%    Weight:      Height:        Wt Readings from Last 3 Encounters:  04/19/19 (!) 138.3 kg  03/07/19 (!) 149.7 kg  01/27/19 (!) 147.4 kg     Intake/Output Summary (Last 24 hours) at 04/24/2019 1147 Last data filed at 04/24/2019 0750 Gross per 24 hour  Intake 925.45 ml  Output 2200 ml  Net -1274.55 ml     Physical Exam Gen Exam:Alert awake-not in any distress HEENT:atraumatic, normocephalic Chest: B/L clear to auscultation anteriorly CVS:S1S2  regular Abdomen:soft non tender, non distended Extremities:+ edema Neurology: Non focal Skin: no rash   Data Review:    CBC Recent Labs  Lab 04/20/19 0300 04/20/19 2000 04/21/19 0340 04/22/19 0423 04/23/19 0434 04/24/19 0505  WBC 11.8* 17.3* 16.1* 19.5* 21.2* 19.0*  HGB 9.1* 9.9* 9.1* 9.8* 10.1* 10.0*  HCT 28.5* 30.7* 28.4* 30.1* 31.2* 30.9*  PLT 167 283 233 293 363 377  MCV 69.0* 69.0* 68.4* 69.4* 68.7* 68.8*  MCH 22.0* 22.2* 21.9* 22.6* 22.2* 22.3*  MCHC 31.9 32.2 32.0 32.6 32.4 32.4  RDW 17.0* 16.9* 17.0* 17.1* 17.9* 18.6*  LYMPHSABS 1.1  --  1.1 1.0 1.1 1.1  MONOABS 0.4  --  0.5 0.9 1.1* 1.1*  EOSABS 0.0  --  0.0 0.0 0.0 0.0  BASOSABS 0.0  --  0.0 0.1 0.1 0.1    Chemistries  Recent Labs  Lab 04/20/19 0307 04/21/19 0340 04/22/19 0423 04/23/19 0434 04/24/19 0505  NA 134* 135 137 138 138  K 5.3* 5.0 5.6* 5.4* 4.7  CL 102 103 105 103 102  CO2 17* 18* 15* 22 21*  GLUCOSE 123* 142* 105* 122* 108*  BUN 88* 111* 114* 111* 124*  CREATININE 4.48* 4.22* 4.05* 3.85* 3.71*  CALCIUM 7.8* 7.7* 7.7* 8.0* 7.6*  MG  --   --   --  2.5*  --   AST 73* 40 29 22 17   ALT 40 34 30 27 24   ALKPHOS 62 56 60 52 45  BILITOT 1.3* 0.7 0.4 0.9 0.7   ------------------------------------------------------------------------------------------------------------------ No results for input(s): CHOL, HDL, LDLCALC, TRIG, CHOLHDL, LDLDIRECT in the last 72 hours.  Lab Results  Component Value Date   HGBA1C 6.4 (A) 03/07/2019   ------------------------------------------------------------------------------------------------------------------ No results for input(s): TSH, T4TOTAL, T3FREE, THYROIDAB in the last 72 hours.  Invalid input(s): FREET3 ------------------------------------------------------------------------------------------------------------------ No results for input(s): VITAMINB12, FOLATE, FERRITIN, TIBC, IRON, RETICCTPCT in the last 72 hours.  Coagulation profile Recent Labs    Lab 04/19/19 2017 04/20/19 2000  INR 1.6* 2.1*    Recent Labs    04/23/19 0434 04/24/19 0505  DDIMER >20.00* >20.00*    Cardiac Enzymes No results for input(s): CKMB, TROPONINI, MYOGLOBIN in the last 168 hours.  Invalid input(s): CK ------------------------------------------------------------------------------------------------------------------    Component Value Date/Time   BNP 413.0 (H) 04/19/2019 1820    Micro Results Recent Results (from the past 240 hour(s))  Blood culture (routine x 2)     Status: None   Collection Time: 04/19/19  6:20 PM   Specimen: BLOOD  Result Value Ref Range Status   Specimen Description   Final    BLOOD RIGHT ANTECUBITAL Performed at [facility] [address] [place], [place] [address]    Special Requests   Final    BOTTLES DRAWN AEROBIC ONLY Blood Culture adequate volume   Culture   Final    NO GROWTH 5 DAYS Performed at [facility], [place] [address] [place], [place] [address]    Report Status 04/24/2019 FINAL  Final  Blood culture (routine x 2)     Status: None   Collection Time: 04/19/19  6:35 PM   Specimen: BLOOD  Result Value Ref Range Status   Specimen Description   Final    BLOOD LEFT ANTECUBITAL Performed at [facility] [address], [place], [place] [address]    Special Requests   Final    BOTTLES DRAWN AEROBIC ONLY Blood Culture adequate volume   Culture   Final    NO GROWTH 5 DAYS Performed at [facility], [place] [address] [place], [place] [address]    Report Status 04/24/2019 FINAL  Final  MRSA PCR Screening     Status: None   Collection Time: 04/21/19  4:24 AM   Specimen: Nasal Mucosa; Nasopharyngeal  Result Value Ref Range Status   MRSA by PCR NEGATIVE NEGATIVE Final    Comment:        The GeneXpert MRSA Assay (FDA approved for NASAL specimens only), is one component of a comprehensive MRSA colonization surveillance program. It is not intended to  diagnose MRSA infection nor to guide or monitor treatment for MRSA infections. Performed at [facility], [place] [address], [place], [place] [address]     Radiology Reports US Renal  Result Date: 04/20/2019 CLINICAL DATA:  74 year old male with a history of acute kidney injury EXAM: RENAL / URINARY TRACT ULTRASOUND COMPLETE COMPARISON:  None. FINDINGS: Right Kidney: Length: 11.7 cm x 4.8 cm x 6.1 cm, 181 cc. Echogenicity of the right renal cortex relatively increased.  No hydronephrosis. Flow confirmed in the hilum of the right kidney. No focal echogenic focus. Left Kidney: Length: 10.5 cm x 6.4 cm x 5.4 cm, 190 cc. Echogenicity of the left kidney relatively increased. No hydronephrosis. No focal echogenic focus. Flow confirmed in the hilum. Bladder: Bladder partially distended, otherwise unremarkable IMPRESSION: Negative for hydronephrosis. Mildly increased echogenicity of the bilateral kidneys, indicating medical renal disease. Electronically Signed   By: Corrie Mckusick D.O.   On: 04/20/2019 11:16   DG Chest Portable 1 View  Result Date: 04/19/2019 CLINICAL DATA:  Hypoxia. EXAM: PORTABLE CHEST 1 VIEW COMPARISON:  December 09, 2013 FINDINGS: Mild hazy infiltrates are seen within the bilateral lung bases. There is no evidence of a pleural effusion or pneumothorax. The cardiac silhouette is mildly enlarged. The visualized skeletal structures are unremarkable. IMPRESSION: Mild hazy bibasilar infiltrates. Electronically Signed   By: Virgina Norfolk M.D.   On: 04/19/2019 18:54   ECHOCARDIOGRAM COMPLETE  Result Date: 04/20/2019   ECHOCARDIOGRAM REPORT   Patient Name:   KYRESE GARTMAN Date of Exam: 04/20/2019 Medical Rec #:  [id number]        Height:       73.0 in Accession #:    [id number]       Weight:       304.9 lb Date of Birth:  1946/03/23         BSA:          2.57 m Patient Age:    52 years         BP:           127/70 mmHg Patient Gender: M                HR:           86 bpm. Exam  Location:  Inpatient Procedure: 2D Echo, Cardiac Doppler and Color Doppler Indications:    R06.02 SOB  History:        Patient has prior history of Echocardiogram examinations, most                 recent 05/29/2012. Abnormal ECG, Pulmonary HTN, Arrythmias:Atrial                 Flutter and NSVT, Signs/Symptoms:Chest Pain and Dyspnea; Risk                 Factors:Sleep Apnea, Hypertension, Diabetes and Dyslipidemia.                 Covid 19 positive.  Sonographer:    Roseanna Rainbow RDCS Referring Phys: [place]  Sonographer Comments: Technically difficult study due to poor echo windows and patient is morbidly obese. Image acquisition challenging due to patient body habitus. IMPRESSIONS  1. Normal LVEF with severe pulmonary hypertension and likely PA thrombus in right PA, suggestive of pulmonary embolism. RV is only midly enlarged. Findings communicated with Dr. Sloan Leiter.  2. Left ventricular ejection fraction, by visual estimation, is 65 to 70%. The left ventricle has hyperdynamic function. There is severely increased left ventricular hypertrophy.  3. Right ventricular pressure overload.  4. The left ventricle has no regional wall motion abnormalities.  5. Global right ventricle has moderately reduced systolic function.The right ventricular size is mildly enlarged. Right vetricular wall thickness was not assessed.  6. Preserved RV apical function, consistent with McConnell's sign. RV size normal to only slightly enlarged depending on imaging window.  7. Left atrial size was mildly dilated.  8. Right atrial size  was moderately dilated.  9. Small pericardial effusion. 10. The pericardial effusion is circumferential. 11. The mitral valve is normal in structure. Trivial mitral valve regurgitation. 12. The tricuspid valve is normal in structure. 13. The aortic valve is tricuspid. Aortic valve regurgitation is trivial. Mild aortic valve sclerosis without stenosis. 14. Pulmonic regurgitation is moderate. 15. The  pulmonic valve was grossly normal. Pulmonic valve regurgitation is moderate. 16. Severely elevated pulmonary artery systolic pressure. 93. Suspect thromboembolism in the main pulmonary artery. 18. There is a mobile mass seen in the right PA highly suspicious for thrombus. Additional thrombus burden not excluded. 19. The tricuspid regurgitant velocity is 3.74 m/s, and with an assumed right atrial pressure of 15 mmHg, the estimated right ventricular systolic pressure is severely elevated at 70.8 mmHg. 20. The inferior vena cava is dilated in size with <50% respiratory variability, suggesting right atrial pressure of 15 mmHg. FINDINGS  Left Ventricle: Left ventricular ejection fraction, by visual estimation, is 65 to 70%. The left ventricle has hyperdynamic function. The left ventricle has no regional wall motion abnormalities. There is severely increased left ventricular hypertrophy.  Concentric left ventricular hypertrophy. The interventricular septum is flattened in systole, consistent with right ventricular pressure overload. Right Ventricle: The right ventricular size is mildly enlarged. Right vetricular wall thickness was not assessed. Global RV systolic function is has moderately reduced systolic function. The tricuspid regurgitant velocity is 3.74 m/s, and with an assumed  right atrial pressure of 15 mmHg, the estimated right ventricular systolic pressure is severely elevated at 70.8 mmHg. Preserved RV apical function, consistent with McConnell's sign. RV size normal to only slightly enlarged depending on imaging window. Left Atrium: Left atrial size was mildly dilated. Right Atrium: Right atrial size was moderately dilated Pericardium: A small pericardial effusion is present. The pericardial effusion is circumferential. Mitral Valve: The mitral valve is normal in structure. Trivial mitral valve regurgitation. Tricuspid Valve: The tricuspid valve is normal in structure. Tricuspid valve regurgitation moderate.  Aortic Valve: The aortic valve is tricuspid. . There is mild thickening and mild calcification of the aortic valve. Aortic valve regurgitation is trivial. Mild aortic valve sclerosis is present, with no evidence of aortic valve stenosis. There is mild thickening of the aortic valve. There is mild calcification of the aortic valve. Pulmonic Valve: The pulmonic valve was grossly normal. Pulmonic valve regurgitation is moderate. Pulmonic regurgitation is moderate. Aorta: The aortic root, ascending aorta and aortic arch are all structurally normal, with no evidence of dilitation or obstruction. Pulmonary Artery: Suspect thromboembolism in the main pulmonary artery. There is a mobile mass seen in the right PA highly suspicious for thrombus. Additional thrombus burden not excluded. Venous: The inferior vena cava is dilated in size with less than 50% respiratory variability, suggesting right atrial pressure of 15 mmHg. IAS/Shunts: No atrial level shunt detected by color flow Doppler.  LEFT VENTRICLE PLAX 2D LVIDd:         4.29 cm LVIDs:         2.34 cm LV PW:         2.08 cm LV IVS:        2.18 cm LVOT diam:     2.00 cm LV SV:         64 ml LV SV Index:   23.37 LVOT Area:     3.14 cm  LV Volumes (MOD) LV area d, A2C:    28.70 cm LV area d, A4C:    22.30 cm LV area s, A2C:  15.20 cm LV area s, A4C:    12.20 cm LV major d, A2C:   6.53 cm LV major d, A4C:   7.35 cm LV major s, A2C:   5.76 cm LV major s, A4C:   5.75 cm LV vol d, MOD A2C: 104.0 ml LV vol d, MOD A4C: 59.9 ml LV vol s, MOD A2C: 34.7 ml LV vol s, MOD A4C: 23.8 ml LV SV MOD A2C:     69.3 ml LV SV MOD A4C:     59.9 ml LV SV MOD BP:      54.1 ml RIGHT VENTRICLE         IVC TAPSE (M-mode): 1.2 cm  IVC diam: 2.23 cm LEFT ATRIUM             Index       RIGHT ATRIUM           Index LA diam:        4.40 cm 1.71 cm/m  RA Area:     29.40 cm LA Vol (A2C):   93.0 ml 36.13 ml/m RA Volume:   108.00 ml 41.96 ml/m LA Vol (A4C):   77.6 ml 30.15 ml/m LA Biplane Vol:  94.3 ml 36.64 ml/m  AORTIC VALVE             PULMONIC VALVE LVOT Vmax:   149.00 cm/s PR End Diast Vel: 2.12 msec LVOT Vmean:  95.400 cm/s LVOT VTI:    0.197 m  AORTA Ao Asc diam: 3.20 cm MITRAL VALVE                        TRICUSPID VALVE MV Area (PHT): 4.78 cm             TR Peak grad:   55.8 mmHg MV PHT:        46.01 msec           TR Vmax:        387.00 cm/s MV Decel Time: 159 msec MV E velocity: 103.53 cm/s 103 cm/s SHUNTS                                     Systemic VTI:  0.20 m                                     Systemic Diam: 2.00 cm  Buford Dresser MD Electronically signed by Buford Dresser MD Signature Date/Time: 04/20/2019/6:30:29 PM    Final    VAS [place] LOWER EXTREMITY VENOUS (DVT)  Result Date: 04/20/2019  Lower Venous Study Indications: Covid-19, elevated D-Dimer.  Limitations: Body habitus. Comparison Study: No prior study on file Performing Technologist: Sharion Dove RVS  Examination Guidelines: A complete evaluation includes B-mode imaging, spectral Doppler, color Doppler, and power Doppler as needed of all accessible portions of each vessel. Bilateral testing is considered an integral part of a complete examination. Limited examinations for reoccurring indications may be performed as noted.  +---------+---------------+---------+-----------+----------+--------------+ RIGHT    CompressibilityPhasicitySpontaneityPropertiesThrombus Aging +---------+---------------+---------+-----------+----------+--------------+ CFV      Full           Yes      Yes                                 +---------+---------------+---------+-----------+----------+--------------+  SFJ      Full                                                        +---------+---------------+---------+-----------+----------+--------------+ FV Prox  Full                                                        +---------+---------------+---------+-----------+----------+--------------+ FV Mid   Full                                                         +---------+---------------+---------+-----------+----------+--------------+ FV DistalFull                                                        +---------+---------------+---------+-----------+----------+--------------+ PFV      Full                                                        +---------+---------------+---------+-----------+----------+--------------+ POP      Partial                                      Acute          +---------+---------------+---------+-----------+----------+--------------+ PTV      None                                         Acute          +---------+---------------+---------+-----------+----------+--------------+ PERO                                                  Not visualized +---------+---------------+---------+-----------+----------+--------------+ Gastroc  None                                         Acute          +---------+---------------+---------+-----------+----------+--------------+   +---------+---------------+---------+-----------+----------+--------------+ LEFT     CompressibilityPhasicitySpontaneityPropertiesThrombus Aging +---------+---------------+---------+-----------+----------+--------------+ CFV      Full           Yes      Yes                                 +---------+---------------+---------+-----------+----------+--------------+ SFJ  Full                                                        +---------+---------------+---------+-----------+----------+--------------+ FV Prox  Full                                                        +---------+---------------+---------+-----------+----------+--------------+ FV Mid   Full                                                        +---------+---------------+---------+-----------+----------+--------------+ FV DistalFull                                                         +---------+---------------+---------+-----------+----------+--------------+ PFV      Full                                                        +---------+---------------+---------+-----------+----------+--------------+ POP      None                                         Acute          +---------+---------------+---------+-----------+----------+--------------+ PTV      None                                         Acute          +---------+---------------+---------+-----------+----------+--------------+ PERO                                                  Not visualized +---------+---------------+---------+-----------+----------+--------------+ Gastroc  None                                         Acute          +---------+---------------+---------+-----------+----------+--------------+     Summary: Right: Findings consistent with acute deep vein thrombosis involving the right popliteal vein, right posterior tibial veins, and right gastrocnemius veins. Left: Findings consistent with acute deep vein thrombosis involving the left popliteal vein, left posterior tibial veins, and left gastrocnemius veins.  *See table(s) above for measurements and observations. Electronically signed by Monica Martinez MD on 04/20/2019 at 5:10:07 PM.    Final

## 2019-04-24 NOTE — Progress Notes (Signed)
Nathan Grant for heparin Indication: pulmonary embolus, b/l DVTs  Allergies  Allergen Reactions  . Ibuprofen   . Tylenol [Acetaminophen]     Patient Measurements: Height: 6\' 1"  (185.4 cm) Weight: (!) 304 lb 14.3 oz (138.3 kg) IBW/kg (Calculated) : 79.9 Heparin Dosing Weight: 115kg (last wt 149.7kg on 03/07/19)  Vital Signs: Temp: 97.2 F (36.2 C) (01/11 1200) Temp Source: Oral (01/11 1200) BP: 125/84 (01/11 1200) Pulse Rate: 94 (01/11 1200)  Labs: Recent Labs    04/22/19 0423 04/23/19 0434 04/23/19 1705 04/24/19 0105 04/24/19 0505  HGB 9.8* 10.1*  --   --  10.0*  HCT 30.1* 31.2*  --   --  30.9*  PLT 293 363  --   --  377  HEPARINUNFRC 0.11* 1.13* 0.90* 0.51 0.61  CREATININE 4.05* 3.85*  --   --  3.71*    Estimated Creatinine Clearance: 25.5 mL/min (A) (by C-G formula based on SCr of 3.71 mg/dL (H)).   Assessment: [name].  Pharmacy consulted to dose heparin for PE.  No prior AC.  Heparin level therapeutic on 2250 units/hr 1/7 pm. Decision made to treat with lytic - TPA 100mg  given 1/7 2030.  Heparin level supratherapeutic this morning at 1.13 on 1900 units/hr. Spoke with phlebotomy and level drawn from opposite arm as heparin infusion; also confirmed with RN. No problems with infusion or bleeding noted. Hgb stable in 9-10s, platelets are normal. Renal function is improving.  Heparin level at goal 0.51 this afternoon.  No bleeding reported.   Goal of Therapy: Heparin level 0.3-0.7 units/ml  Monitor platelets by anticoagulation protocol   Plan:  Continue heparin infusion at 1500 units/hr Monitor daily HL, CBC and s/s of bleeding   Peggyann Juba, PharmD, BCPS Pharmacy: [phone] 04/24/2019 2:27 PM

## 2019-04-24 NOTE — Progress Notes (Signed)
Occupational Therapy Evaluation Patient Details Name: Nathan Grant MRN: [id number] DOB: Mar 09, 1946 Today's Date: 04/24/2019    History of Present Illness 74 year old male admitted with COVID- PMH includes HTN, CHronic DIastolic CHF, B/ LE DVT   Clinical Impression   PTA pt lived with his wife, independent in all ADL, IADL, and mobility tasks. Pt does not ambulate with an assistive device and reports 0 falls in the last 6 months. Pt does not use oxygen at home and is currently on 2L [place]. Pt currently independent to min assist for self-care and functional transfer tasks. Pt tolerated sitting edge of bed ~15+ min while engaging in lower body dressing and grooming tasks. HR fluctuated between 90-118 during edge of bed tasks with oxygen maintaining in 90s on room air. Pt able to stand pivot to bedside chair with min guard. HR increased to 140 with SpO2 dropping to mid 80s on room air. Pt unable to increase oxygen with pursed lip breathing, with 1L oxygen donned. SpO2 97% at rest on 1L [place] and HR in 90s at end of session. 2/4 DOE. Educated pt on safety strategies and energy conservation techniques with fair understanding. Pt demonstrates decreased strength, endurance, balance, standing tolerance, and activity tolerance impacting ability to complete self-care and functional transfer tasks safely and independently. Recommend skilled OT services to address above deficits in order to promote function and prevent further decline.     Follow Up Recommendations  Supervision - Intermittent    Equipment Recommendations  3 in 1 bedside commode(for use in shower)    Recommendations for Other Services       Precautions / Restrictions Precautions Precautions: Fall;Other (comment) Precaution Comments: Monitor HR Restrictions Weight Bearing Restrictions: No      Mobility Bed Mobility Overal bed mobility: Needs Assistance Bed Mobility: Supine to Sit     Supine to sit: Supervision;HOB elevated      General bed mobility comments: Use of bedrail  Transfers Overall transfer level: Needs assistance Equipment used: 1 person hand held assist Transfers: Sit to/from [facility] Sit to Stand: Min guard Stand pivot transfers: Min guard       General transfer comment: Pt able to stand and pivot to bedside chair. Activity limited due to high HR upon minimal exertion    Balance Overall balance assessment: Mild deficits observed, not formally tested                                         ADL either performed or assessed with clinical judgement   ADL Overall ADL's : Needs assistance/impaired Eating/Feeding: Independent;Sitting   Grooming: Supervision/safety;Wash/dry face;Wash/dry hands;Sitting   Upper Body Bathing: Supervision/ safety;Set up;Sitting   Lower Body Bathing: Minimal assistance;Sit to/from stand;Sitting/lateral leans   Upper Body Dressing : Supervision/safety;Set up;Sitting   Lower Body Dressing: Minimal assistance;Sit to/from stand;Sitting/lateral leans   Toilet Transfer: Min guard;BSC   Toileting- Clothing Manipulation and Hygiene: Minimal assistance;Sitting/lateral lean;Sit to/from stand       Functional mobility during ADLs: Min guard General ADL Comments: Pt able to stand pivot to bedside chair this date. Activity limited due to high HR upon minimal exertion     Vision Baseline Vision/History: Wears glasses Wears Glasses: At all times(Does not have them here)       Perception     Praxis      Pertinent Vitals/Pain Pain Assessment: No/denies pain  Hand Dominance Left   Extremity/Trunk Assessment Upper Extremity Assessment Upper Extremity Assessment: Overall WFL for tasks assessed   Lower Extremity Assessment Lower Extremity Assessment: Defer to PT evaluation       Communication Communication Communication: No difficulties   Cognition Arousal/Alertness: Awake/alert Behavior During Therapy: WFL for tasks  assessed/performed Overall Cognitive Status: No family/caregiver present to determine baseline cognitive functioning                                     General Comments  Pt on 2L [place] at rest with SpO2 96%. SpO2 decreased to mid 80s with exertion on room air with HR fluctuating between 90-140bpm. Pt unable to increased oxygen with pursed lip breathing with 1L [place] donned and O2 increasing to 93%.    Exercises     Shoulder Instructions      Home Living Family/patient expects to be discharged to:: Private residence Living Arrangements: Spouse/significant other Available Help at Discharge: Family;Available 24 hours/day Type of Home: House Home Access: Stairs to enter [employer] of Steps: 3 Entrance Stairs-Rails: Right Home Layout: Two level;Able to live on main level with bedroom/bathroom     Bathroom Shower/Tub: Occupational psychologist: Standard     Home Equipment: Grab bars - tub/shower          Prior Functioning/Environment Level of Independence: Independent        Comments: Pt independent in ADLs, IADLs, and mobility. Pt does not ambulate with an assistive device. Pt reports 0 falls in the last 6 months. Pt still drives.        OT Problem List: Decreased strength;Decreased activity tolerance;Impaired balance (sitting and/or standing);Cardiopulmonary status limiting activity      OT Treatment/Interventions: Self-care/ADL training;Therapeutic exercise;Neuromuscular education;Energy conservation;DME and/or AE instruction;Therapeutic activities;Patient/family education;Balance training    OT Goals(Current goals can be found in the care plan section) Acute Rehab OT Goals Patient Stated Goal: To go home Time For Goal Achievement: 05/08/19 Potential to Achieve Goals: Good ADL Goals Pt Will Perform Grooming: Independently;standing Pt Will Perform Lower Body Bathing: sit to/from stand;with modified independence Pt Will Perform Lower Body  Dressing: with modified independence;sit to/from stand Pt Will Transfer to Toilet: Independently;ambulating Pt Will Perform Toileting - Clothing Manipulation and hygiene: Independently;sit to/from stand Additional ADL Goal #1: Pt to recall and verbalize 3 energy conservation techniques with 0 verbal cues. Additional ADL Goal #2: Pt to tolerate standing up to 10 min with supervision and SpO2 maintaining above 90%, in preparation for ADLs.  OT Frequency: Min 3X/week   Barriers to D/C:            Co-evaluation              AM-PAC OT "6 Clicks" Daily Activity     Outcome Measure Help from another person eating meals?: None Help from another person taking care of personal grooming?: A Little Help from another person toileting, which includes using toliet, bedpan, or urinal?: A Little Help from another person bathing (including washing, rinsing, drying)?: A Little Help from another person to put on and taking off regular upper body clothing?: A Little Help from another person to put on and taking off regular lower body clothing?: A Little 6 Click Score: 19   End of Session Equipment Utilized During Treatment: Oxygen Nurse Communication: Mobility status  Activity Tolerance: Patient limited by fatigue Patient left: in chair;with call bell/phone within reach  OT Visit Diagnosis: Unsteadiness on feet (R26.81);Muscle weakness (generalized) (M62.81)                Time: 1103-1594 OT Time Calculation (min): 41 min Charges:  OT General Charges $OT Visit: 1 Visit OT Evaluation $OT Eval Moderate Complexity: 1 Mod OT Treatments $Self Care/Home Management : 8-22 mins $Therapeutic Activity: 8-22 mins  Mauri Brooklyn OTR/L [phone]   Mauri Brooklyn 04/24/2019, 8:47 AM

## 2019-04-25 LAB — CBC
HCT: 29.2 % — ABNORMAL LOW (ref 39.0–52.0)
Hemoglobin: 9.7 g/dL — ABNORMAL LOW (ref 13.0–17.0)
MCH: 22.7 pg — ABNORMAL LOW (ref 26.0–34.0)
MCHC: 33.2 g/dL (ref 30.0–36.0)
MCV: 68.4 fL — ABNORMAL LOW (ref 80.0–100.0)
Platelets: 359 10*3/uL (ref 150–400)
RBC: 4.27 MIL/uL (ref 4.22–5.81)
RDW: 18.6 % — ABNORMAL HIGH (ref 11.5–15.5)
WBC: 17.8 10*3/uL — ABNORMAL HIGH (ref 4.0–10.5)
nRBC: 0.7 % — ABNORMAL HIGH (ref 0.0–0.2)

## 2019-04-25 LAB — COMPREHENSIVE METABOLIC PANEL
ALT: 23 U/L (ref 0–44)
AST: 17 U/L (ref 15–41)
Albumin: 2.7 g/dL — ABNORMAL LOW (ref 3.5–5.0)
Alkaline Phosphatase: 45 U/L (ref 38–126)
Anion gap: 16 — ABNORMAL HIGH (ref 5–15)
BUN: 125 mg/dL — ABNORMAL HIGH (ref 8–23)
CO2: 21 mmol/L — ABNORMAL LOW (ref 22–32)
Calcium: 8.2 mg/dL — ABNORMAL LOW (ref 8.9–10.3)
Chloride: 102 mmol/L (ref 98–111)
Creatinine, Ser: 3.57 mg/dL — ABNORMAL HIGH (ref 0.61–1.24)
GFR calc Af Amer: 18 mL/min — ABNORMAL LOW (ref >60)
GFR calc non Af Amer: 16 mL/min — ABNORMAL LOW (ref >60)
Glucose, Bld: 92 mg/dL (ref 70–99)
Potassium: 4.7 mmol/L (ref 3.5–5.1)
Sodium: 139 mmol/L (ref 135–145)
Total Bilirubin: 0.7 mg/dL (ref 0.3–1.2)
Total Protein: 5.7 g/dL — ABNORMAL LOW (ref 6.5–8.1)

## 2019-04-25 LAB — GLUCOSE, CAPILLARY
Glucose-Capillary: 98 mg/dL (ref 70–99)
Glucose-Capillary: 122 mg/dL — ABNORMAL HIGH (ref 70–99)
Glucose-Capillary: 149 mg/dL — ABNORMAL HIGH (ref 70–99)

## 2019-04-25 LAB — HEPARIN LEVEL (UNFRACTIONATED)
Heparin Unfractionated: 0.73 IU/mL — ABNORMAL HIGH (ref 0.30–0.70)
Heparin Unfractionated: 0.43 IU/mL (ref 0.30–0.70)

## 2019-04-25 LAB — D-DIMER, QUANTITATIVE: D-Dimer, Quant: >20 ug/mL-FEU — ABNORMAL HIGH (ref 0.00–0.50)

## 2019-04-25 LAB — C-REACTIVE PROTEIN: CRP: 3 mg/dL — ABNORMAL HIGH (ref <1.0)

## 2019-04-25 LAB — FERRITIN: Ferritin: 1117 ng/mL — ABNORMAL HIGH (ref 24–336)

## 2019-04-25 MED ORDER — MAGNESIUM SULFATE IN D5W 1-5 GM/100ML-% IV SOLN
1.0000 g | Freq: Once | INTRAVENOUS | Status: AC
  Administered 2019-04-25: 1 g via INTRAVENOUS
  Filled 2019-04-25: qty 100

## 2019-04-25 MED ORDER — PREDNISONE 20 MG PO TABS
30.0000 mg | ORAL_TABLET | Freq: Every day | ORAL | Status: DC
  Administered 2019-04-25 – 2019-04-26 (×2): 30 mg via ORAL
  Filled 2019-04-25 (×2): qty 1

## 2019-04-25 NOTE — Progress Notes (Signed)
[place] for heparin Indication: pulmonary embolus, b/l DVTs  Allergies  Allergen Reactions  . Ibuprofen   . Tylenol [Acetaminophen]     Patient Measurements: Height: 6\' 1"  (185.4 cm) Weight: (!) 320 lb 15.8 oz (145.6 kg) IBW/kg (Calculated) : 79.9 Heparin Dosing Weight: 115kg (last wt 149.7kg on 03/07/19)  Vital Signs: Temp: 97.5 F (36.4 C) (01/12 0712) Temp Source: Oral (01/12 0712) BP: 154/82 (01/12 0712) Pulse Rate: 78 (01/12 0712)  Labs: Recent Labs    04/23/19 0434 04/24/19 0105 04/24/19 0505 04/25/19 0410  HGB 10.1*  --  10.0* 9.7*  HCT 31.2*  --  30.9* 29.2*  PLT 363  --  377 359  HEPARINUNFRC 1.13* 0.51 0.61 0.73*  CREATININE 3.85*  --  3.71* 3.57*    Estimated Creatinine Clearance: 27.3 mL/min (A) (by C-G formula based on SCr of 3.57 mg/dL (H)).   Assessment: [name].  Pharmacy consulted to dose heparin for PE.  No prior AC.  TPA 100mg  given 1/7 2030.  Heparin level slightly above goal this AM. No bleeding reported.   Goal of Therapy: Heparin level 0.3-0.7 units/ml  Monitor platelets by anticoagulation protocol   Plan:  Decrease heparin infusion to 1400 units/hr Recheck HL in 8 hours Monitor daily HL, CBC and s/s of bleeding   Ulice Dash, PharmD, BCPS 04/25/2019 9:23 AM

## 2019-04-25 NOTE — Progress Notes (Signed)
[place] for heparin Indication: pulmonary embolus, b/l DVTs  Allergies  Allergen Reactions  . Ibuprofen   . Tylenol [Acetaminophen]     Patient Measurements: Height: 6\' 1"  (185.4 cm) Weight: (!) 320 lb 15.8 oz (145.6 kg) IBW/kg (Calculated) : 79.9 Heparin Dosing Weight: 115kg (last wt 149.7kg on 03/07/19)  Vital Signs: Temp: 98 F (36.7 C) (01/12 1700) Temp Source: Oral (01/12 1700) BP: 160/78 (01/12 1700) Pulse Rate: 89 (01/12 1700)  Labs: Recent Labs    04/23/19 0434 04/24/19 0505 04/25/19 0410 04/25/19 1806  HGB 10.1* 10.0* 9.7*  --   HCT 31.2* 30.9* 29.2*  --   PLT 363 377 359  --   HEPARINUNFRC 1.13* 0.61 0.73* 0.43  CREATININE 3.85* 3.71* 3.57*  --     Estimated Creatinine Clearance: 27.3 mL/min (A) (by C-G formula based on SCr of 3.57 mg/dL (H)).   Assessment: [name].  Pharmacy consulted to dose heparin for PE.  No prior AC.  TPA 100mg  given 1/7 2030.  Heparin level therapeutic (0.43) after decreasing rate to 1400 units/hr. No bleeding reported.   Goal of Therapy: Heparin level 0.3-0.7 units/ml  Monitor platelets by anticoagulation protocol   Plan:  Continue heparin infusion at 1400 units/hr Monitor daily HL, CBC and s/s of bleeding   Peggyann Juba, PharmD, BCPS Pharmacy: [phone] 04/25/2019 7:43 PM

## 2019-04-25 NOTE — Progress Notes (Signed)
PROGRESS NOTE                                                                                                                                                                                                             Patient Demographics:    Nathan Grant, is a 74 y.o. male, DOB - 03/11/1946, WNU:[id number]  Outpatient Primary MD for the patient is Forrest Moron, MD   Admit date - 04/19/2019   LOS - 6  Chief Complaint  Patient presents with  . Respiratory Distress       Brief Narrative: Patient is a 74 y.o. male with PMHx of HTN, DM-2, CKD stage IIIb, history of PAF-presented to the ED with 3-day history of shortness of breath-found to have acute hypoxic respiratory failure secondary to COVID-19 pneumonia along with AKI.  Patient was subsequently admitted to the hospitalist service at [facility].  Significant events: 1/7>> Doppler lower extremity positive for bilateral DVT 1/7>> TTE with clot in pulmonary artery with moderate RV dysfunction and severe pulmonary hypertension 1/7>> transferred to ICU given TPA 1/8>> oxygenation improved-transfer back to PCU    Subjective:   No major issues overnight-on room air this morning!.  His breathing is significantly improved per patient.   Assessment  & Plan :   Acute Hypoxic Resp Failure due to pulmonary embolism with RV strain and Covid 19 with concurrent bacterial pneumonia: Remarkable improvement post TPA-on room air this morning.  Continue steroids but taper.  Has finished a course of remdesivir on 1/10, and Rocephin on 1/11.  Continue supportive care.  Fever: afebrile  O2 requirements:  SpO2: 92 % O2 Flow Rate (L/min): 1 L/min   COVID-19 Labs: Recent Labs    04/23/19 0434 04/24/19 0505 04/25/19 0410  DDIMER >20.00* >20.00* >20.00*  FERRITIN  --   --  1,117*  CRP 5.4* 3.5* 3.0*       Component Value Date/Time   BNP 413.0 (H) 04/19/2019 1820      Recent Labs  Lab 04/19/19 2003 04/20/19 0307  PROCALCITON 0.96 2.16    Lab Results  Component Value Date   SARSCOV2NAA Detected (A) 04/12/2019     COVID-19 Medications: Steroids: 1/6>> Remdesivir:1/6>>1/10  Other medications: Antibiotics: Rocephin: 1/7>> 1/11 Zithromax: 1/7>>1/10  Prone/Incentive Spirometry: encouraged  incentive spirometry use 3-4/hour.  DVT Prophylaxis  :  Heparin gtt  AKI on CKD stage IIIb: Suspect this is hemodynamically mediated in the setting of acute infection with COVID-19 along with the use of Aldactone and losartan.  UA without proteinuria, renal ultrasound negative for hydronephrosis.  Slowly improving-patient is making good urine.  Creatinine is downtrending however-BUN remains significantly elevated (on steroids/did receive Lasix for the past few days).volume status is relatively stable-continue to hold Lasix today-follow.  Nephrology has signed off.  Hyperkalemia: Resolved-continue Lokelma for a few more days.  Pulmonary embolism with RV strain and bilateral lower extremity Doppler: Due to severe hypoxemia-underwent TPA infusion on 1/7-remains on IV heparin-with plans to transition to oral anticoagulation agent (if renal function continues to improve-and if felt not to require further procedures).    Note echo on 1/7 demonstrated a pulmonary embolus (clot seen in PA) with severe pulmonary hypertension.    HTN: BP relatively stable-continue amlodipine.  Follow and optimize.  DM-2 (A1c 6.4): Continue close monitoring with SSI-follow closely.  CBG (last 3)  Recent Labs    04/24/19 2118 04/25/19 0710 04/25/19 1148  GLUCAP 172* 98 122*    Chronic diastolic heart failure: Remains compensated-has chronic-1+ pitting edema in the lower extremities-continue to hold Lasix today.   PAF: Currently in sinus rhythm-anticoagulated for VTE.  Obesity: Estimated body mass index is 42.35 kg/m as calculated from the following:   Height as of this  encounter: 6\' 1"  (1.854 m).   Weight as of this encounter: 145.6 kg.    Consults  : Nephrology,PCCM  Procedures  :  None  ABG:    Component Value Date/Time   TCO2 28 10/03/2017 1025    Vent Settings: N/A  Condition -stable  Family Communication  : Spouse updated over the phone on 1/10  Code Status :  Full Code  Diet :  Diet Order            Diet Heart Room service appropriate? Yes; Fluid consistency: Thin  Diet effective now               Disposition Plan  :  Remain hospitalized-Home with home health services when closer to discharge.  Barriers to discharge: Further improvement in renal function.  Antimicorbials  :    Anti-infectives (From admission, onward)   Start     Dose/Rate Route Frequency Ordered Stop   04/21/19 1600  remdesivir 100 mg in sodium chloride 0.9 % 100 mL IVPB     100 mg 200 mL/hr over 30 Minutes Intravenous Daily 04/20/19 1658 04/23/19 1105   04/21/19 1000  remdesivir 100 mg in sodium chloride 0.9 % 100 mL IVPB  Status:  Discontinued     100 mg 200 mL/hr over 30 Minutes Intravenous Daily 04/20/19 0525 04/20/19 1658   04/20/19 2000  remdesivir 100 mg in sodium chloride 0.9 % 100 mL IVPB     100 mg 200 mL/hr over 30 Minutes Intravenous  Once 04/20/19 1658 04/20/19 2026   04/20/19 1400  azithromycin (ZITHROMAX) 500 mg in sodium chloride 0.9 % 250 mL IVPB  Status:  Discontinued     500 mg 250 mL/hr over 60 Minutes Intravenous Every 24 hours 04/20/19 1308 04/23/19 1055   04/20/19 1330  cefTRIAXone (ROCEPHIN) 1 g in sodium chloride 0.9 % 100 mL IVPB     1 g 200 mL/hr over 30 Minutes Intravenous Every 24 hours 04/20/19 1308 04/24/19 1416   04/20/19 1000  remdesivir 100 mg in sodium chloride 0.9 % 100 mL IVPB  Status:  Discontinued  100 mg 200 mL/hr over 30 Minutes Intravenous Daily 04/19/19 2015 04/20/19 0525   04/19/19 2200  remdesivir 200 mg in sodium chloride 0.9% 250 mL IVPB     200 mg 580 mL/hr over 30 Minutes Intravenous Once 04/19/19  2015 04/20/19 0302      Inpatient Medications  Scheduled Meds: . amLODipine  5 mg Oral Daily  . vitamin C  500 mg Oral Daily  . Chlorhexidine Gluconate Cloth  6 each Topical Daily  . insulin aspart  0-9 Units Subcutaneous TID WC  . pantoprazole  40 mg Oral Daily  . predniSONE  30 mg Oral Q breakfast  . sodium zirconium cyclosilicate  10 g Oral Daily  . zinc sulfate  220 mg Oral Daily   Continuous Infusions: . heparin 1,400 Units/hr (04/25/19 1008)   PRN Meds:.albuterol, hydrALAZINE, ondansetron **OR** ondansetron (ZOFRAN) IV, oxymetazoline, polyethylene glycol, sodium chloride   Time Spent in minutes 25  See all Orders from today for further details   Oren Binet M.D on 04/25/2019 at 12:10 PM  To page go to [email or web address] - use universal password  [facility] -  Office  [phone]    Objective:   Vitals:   04/25/19 0400 04/25/19 0712 04/25/19 1009 04/25/19 1150  BP: (!) 150/86 (!) 154/82 (!) 150/75 (!) 162/88  Pulse: 74 78    Resp: 17 17    Temp: (!) 97 F (36.1 C) (!) 97.5 F (36.4 C)  97.8 F (36.6 C)  TempSrc: Axillary Oral  Oral  SpO2: 94% 95%  92%  Weight:      Height:        Wt Readings from Last 3 Encounters:  04/25/19 (!) 145.6 kg  03/07/19 (!) 149.7 kg  01/27/19 (!) 147.4 kg     Intake/Output Summary (Last 24 hours) at 04/25/2019 1210 Last data filed at 04/25/2019 0727 Gross per 24 hour  Intake 821.34 ml  Output 660 ml  Net 161.34 ml     Physical Exam Gen Exam:Alert awake-not in any distress HEENT:atraumatic, normocephalic Chest: B/L clear to auscultation anteriorly CVS:S1S2 regular Abdomen:soft non tender, non distended Extremities:no edema Neurology: Non focal Skin: no rash   Data Review:    CBC Recent Labs  Lab 04/20/19 0300 04/21/19 0340 04/22/19 0423 04/23/19 0434 04/24/19 0505 04/25/19 0410  WBC 11.8* 16.1* 19.5* 21.2* 19.0* 17.8*  HGB 9.1* 9.1* 9.8* 10.1* 10.0* 9.7*  HCT 28.5* 28.4* 30.1* 31.2* 30.9*  29.2*  PLT 167 233 293 363 377 359  MCV 69.0* 68.4* 69.4* 68.7* 68.8* 68.4*  MCH 22.0* 21.9* 22.6* 22.2* 22.3* 22.7*  MCHC 31.9 32.0 32.6 32.4 32.4 33.2  RDW 17.0* 17.0* 17.1* 17.9* 18.6* 18.6*  LYMPHSABS 1.1 1.1 1.0 1.1 1.1  --   MONOABS 0.4 0.5 0.9 1.1* 1.1*  --   EOSABS 0.0 0.0 0.0 0.0 0.0  --   BASOSABS 0.0 0.0 0.1 0.1 0.1  --     Chemistries  Recent Labs  Lab 04/21/19 0340 04/22/19 0423 04/23/19 0434 04/24/19 0505 04/25/19 0410  NA 135 137 138 138 139  K 5.0 5.6* 5.4* 4.7 4.7  CL 103 105 103 102 102  CO2 18* 15* 22 21* 21*  GLUCOSE 142* 105* 122* 108* 92  BUN 111* 114* 111* 124* 125*  CREATININE 4.22* 4.05* 3.85* 3.71* 3.57*  CALCIUM 7.7* 7.7* 8.0* 7.6* 8.2*  MG  --   --  2.5*  --   --   AST 40 29 22 17 17   ALT 34 30  27 24 23   ALKPHOS 56 60 52 45 45  BILITOT 0.7 0.4 0.9 0.7 0.7   ------------------------------------------------------------------------------------------------------------------ No results for input(s): CHOL, HDL, LDLCALC, TRIG, CHOLHDL, LDLDIRECT in the last 72 hours.  Lab Results  Component Value Date   HGBA1C 6.4 (A) 03/07/2019   ------------------------------------------------------------------------------------------------------------------ No results for input(s): TSH, T4TOTAL, T3FREE, THYROIDAB in the last 72 hours.  Invalid input(s): FREET3 ------------------------------------------------------------------------------------------------------------------ Recent Labs    04/25/19 0410  FERRITIN 1,117*    Coagulation profile Recent Labs  Lab 04/19/19 2017 04/20/19 2000  INR 1.6* 2.1*    Recent Labs    04/24/19 0505 04/25/19 0410  DDIMER >20.00* >20.00*    Cardiac Enzymes No results for input(s): CKMB, TROPONINI, MYOGLOBIN in the last 168 hours.  Invalid input(s): CK ------------------------------------------------------------------------------------------------------------------    Component Value Date/Time   BNP 413.0  (H) 04/19/2019 1820    Micro Results Recent Results (from the past 240 hour(s))  Blood culture (routine x 2)     Status: None   Collection Time: 04/19/19  6:20 PM   Specimen: BLOOD  Result Value Ref Range Status   Specimen Description   Final    BLOOD RIGHT ANTECUBITAL Performed at [facility] [address] [place], [place] [address]    Special Requests   Final    BOTTLES DRAWN AEROBIC ONLY Blood Culture adequate volume   Culture   Final    NO GROWTH 5 DAYS Performed at [facility], [place] [address], [place], [place] [address]    Report Status 04/24/2019 FINAL  Final  Blood culture (routine x 2)     Status: None   Collection Time: 04/19/19  6:35 PM   Specimen: BLOOD  Result Value Ref Range Status   Specimen Description   Final    BLOOD LEFT ANTECUBITAL Performed at [facility] [address], [place], [place] [address]    Special Requests   Final    BOTTLES DRAWN AEROBIC ONLY Blood Culture adequate volume   Culture   Final    NO GROWTH 5 DAYS Performed at [facility], [place] [address] [place], [place] [address]    Report Status 04/24/2019 FINAL  Final  MRSA PCR Screening     Status: None   Collection Time: 04/21/19  4:24 AM   Specimen: Nasal Mucosa; Nasopharyngeal  Result Value Ref Range Status   MRSA by PCR NEGATIVE NEGATIVE Final    Comment:        The GeneXpert MRSA Assay (FDA approved for NASAL specimens only), is one component of a comprehensive MRSA colonization surveillance program. It is not intended to diagnose MRSA infection nor to guide or monitor treatment for MRSA infections. Performed at [facility], [place] [address], [place], [place] [address]     Radiology Reports US Renal  Result Date: 04/20/2019 CLINICAL DATA:  74 year old male with a history of acute kidney injury EXAM: RENAL / URINARY TRACT ULTRASOUND COMPLETE COMPARISON:  None. FINDINGS: Right Kidney: Length: 11.7 cm x  4.8 cm x 6.1 cm, 181 cc. Echogenicity of the right renal cortex relatively increased. No hydronephrosis. Flow confirmed in the hilum of the right kidney. No focal echogenic focus. Left Kidney: Length: 10.5 cm x 6.4 cm x 5.4 cm, 190 cc. Echogenicity of the left kidney relatively increased. No hydronephrosis. No focal echogenic focus. Flow confirmed in the hilum. Bladder: Bladder partially distended, otherwise unremarkable IMPRESSION: Negative for hydronephrosis. Mildly increased echogenicity of the bilateral kidneys, indicating  medical renal disease. Electronically Signed   By: Corrie Mckusick D.O.   On: 04/20/2019 11:16   DG Chest Portable 1 View  Result Date: 04/19/2019 CLINICAL DATA:  Hypoxia. EXAM: PORTABLE CHEST 1 VIEW COMPARISON:  December 09, 2013 FINDINGS: Mild hazy infiltrates are seen within the bilateral lung bases. There is no evidence of a pleural effusion or pneumothorax. The cardiac silhouette is mildly enlarged. The visualized skeletal structures are unremarkable. IMPRESSION: Mild hazy bibasilar infiltrates. Electronically Signed   By: Virgina Norfolk M.D.   On: 04/19/2019 18:54   ECHOCARDIOGRAM COMPLETE  Result Date: 04/20/2019   ECHOCARDIOGRAM REPORT   Patient Name:   KEYMARION BEARMAN Date of Exam: 04/20/2019 Medical Rec #:  [id number]        Height:       73.0 in Accession #:    [id number]       Weight:       304.9 lb Date of Birth:  08-Feb-1946         BSA:          2.57 m Patient Age:    63 years         BP:           127/70 mmHg Patient Gender: M                HR:           86 bpm. Exam Location:  Inpatient Procedure: 2D Echo, Cardiac Doppler and Color Doppler Indications:    R06.02 SOB  History:        Patient has prior history of Echocardiogram examinations, most                 recent 05/29/2012. Abnormal ECG, Pulmonary HTN, Arrythmias:Atrial                 Flutter and NSVT, Signs/Symptoms:Chest Pain and Dyspnea; Risk                 Factors:Sleep Apnea, Hypertension, Diabetes and  Dyslipidemia.                 Covid 19 positive.  Sonographer:    Roseanna Rainbow RDCS Referring Phys: [place]  Sonographer Comments: Technically difficult study due to poor echo windows and patient is morbidly obese. Image acquisition challenging due to patient body habitus. IMPRESSIONS  1. Normal LVEF with severe pulmonary hypertension and likely PA thrombus in right PA, suggestive of pulmonary embolism. RV is only midly enlarged. Findings communicated with Dr. Sloan Leiter.  2. Left ventricular ejection fraction, by visual estimation, is 65 to 70%. The left ventricle has hyperdynamic function. There is severely increased left ventricular hypertrophy.  3. Right ventricular pressure overload.  4. The left ventricle has no regional wall motion abnormalities.  5. Global right ventricle has moderately reduced systolic function.The right ventricular size is mildly enlarged. Right vetricular wall thickness was not assessed.  6. Preserved RV apical function, consistent with McConnell's sign. RV size normal to only slightly enlarged depending on imaging window.  7. Left atrial size was mildly dilated.  8. Right atrial size was moderately dilated.  9. Small pericardial effusion. 10. The pericardial effusion is circumferential. 11. The mitral valve is normal in structure. Trivial mitral valve regurgitation. 12. The tricuspid valve is normal in structure. 13. The aortic valve is tricuspid. Aortic valve regurgitation is trivial. Mild aortic valve sclerosis without stenosis. 14. Pulmonic regurgitation is moderate. 15. The pulmonic valve was grossly normal. Pulmonic  valve regurgitation is moderate. 16. Severely elevated pulmonary artery systolic pressure. 47. Suspect thromboembolism in the main pulmonary artery. 18. There is a mobile mass seen in the right PA highly suspicious for thrombus. Additional thrombus burden not excluded. 19. The tricuspid regurgitant velocity is 3.74 m/s, and with an assumed right atrial pressure  of 15 mmHg, the estimated right ventricular systolic pressure is severely elevated at 70.8 mmHg. 20. The inferior vena cava is dilated in size with <50% respiratory variability, suggesting right atrial pressure of 15 mmHg. FINDINGS  Left Ventricle: Left ventricular ejection fraction, by visual estimation, is 65 to 70%. The left ventricle has hyperdynamic function. The left ventricle has no regional wall motion abnormalities. There is severely increased left ventricular hypertrophy.  Concentric left ventricular hypertrophy. The interventricular septum is flattened in systole, consistent with right ventricular pressure overload. Right Ventricle: The right ventricular size is mildly enlarged. Right vetricular wall thickness was not assessed. Global RV systolic function is has moderately reduced systolic function. The tricuspid regurgitant velocity is 3.74 m/s, and with an assumed  right atrial pressure of 15 mmHg, the estimated right ventricular systolic pressure is severely elevated at 70.8 mmHg. Preserved RV apical function, consistent with McConnell's sign. RV size normal to only slightly enlarged depending on imaging window. Left Atrium: Left atrial size was mildly dilated. Right Atrium: Right atrial size was moderately dilated Pericardium: A small pericardial effusion is present. The pericardial effusion is circumferential. Mitral Valve: The mitral valve is normal in structure. Trivial mitral valve regurgitation. Tricuspid Valve: The tricuspid valve is normal in structure. Tricuspid valve regurgitation moderate. Aortic Valve: The aortic valve is tricuspid. . There is mild thickening and mild calcification of the aortic valve. Aortic valve regurgitation is trivial. Mild aortic valve sclerosis is present, with no evidence of aortic valve stenosis. There is mild thickening of the aortic valve. There is mild calcification of the aortic valve. Pulmonic Valve: The pulmonic valve was grossly normal. Pulmonic valve  regurgitation is moderate. Pulmonic regurgitation is moderate. Aorta: The aortic root, ascending aorta and aortic arch are all structurally normal, with no evidence of dilitation or obstruction. Pulmonary Artery: Suspect thromboembolism in the main pulmonary artery. There is a mobile mass seen in the right PA highly suspicious for thrombus. Additional thrombus burden not excluded. Venous: The inferior vena cava is dilated in size with less than 50% respiratory variability, suggesting right atrial pressure of 15 mmHg. IAS/Shunts: No atrial level shunt detected by color flow Doppler.  LEFT VENTRICLE PLAX 2D LVIDd:         4.29 cm LVIDs:         2.34 cm LV PW:         2.08 cm LV IVS:        2.18 cm LVOT diam:     2.00 cm LV SV:         64 ml LV SV Index:   23.37 LVOT Area:     3.14 cm  LV Volumes (MOD) LV area d, A2C:    28.70 cm LV area d, A4C:    22.30 cm LV area s, A2C:    15.20 cm LV area s, A4C:    12.20 cm LV major d, A2C:   6.53 cm LV major d, A4C:   7.35 cm LV major s, A2C:   5.76 cm LV major s, A4C:   5.75 cm LV vol d, MOD A2C: 104.0 ml LV vol d, MOD A4C: 59.9 ml LV vol s, MOD A2C:  34.7 ml LV vol s, MOD A4C: 23.8 ml LV SV MOD A2C:     69.3 ml LV SV MOD A4C:     59.9 ml LV SV MOD BP:      54.1 ml RIGHT VENTRICLE         IVC TAPSE (M-mode): 1.2 cm  IVC diam: 2.23 cm LEFT ATRIUM             Index       RIGHT ATRIUM           Index LA diam:        4.40 cm 1.71 cm/m  RA Area:     29.40 cm LA Vol (A2C):   93.0 ml 36.13 ml/m RA Volume:   108.00 ml 41.96 ml/m LA Vol (A4C):   77.6 ml 30.15 ml/m LA Biplane Vol: 94.3 ml 36.64 ml/m  AORTIC VALVE             PULMONIC VALVE LVOT Vmax:   149.00 cm/s PR End Diast Vel: 2.12 msec LVOT Vmean:  95.400 cm/s LVOT VTI:    0.197 m  AORTA Ao Asc diam: 3.20 cm MITRAL VALVE                        TRICUSPID VALVE MV Area (PHT): 4.78 cm             TR Peak grad:   55.8 mmHg MV PHT:        46.01 msec           TR Vmax:        387.00 cm/s MV Decel Time: 159 msec MV E velocity:  103.53 cm/s 103 cm/s SHUNTS                                     Systemic VTI:  0.20 m                                     Systemic Diam: 2.00 cm  Buford Dresser MD Electronically signed by Buford Dresser MD Signature Date/Time: 04/20/2019/6:30:29 PM    Final    VAS [place] LOWER EXTREMITY VENOUS (DVT)  Result Date: 04/20/2019  Lower Venous Study Indications: Covid-19, elevated D-Dimer.  Limitations: Body habitus. Comparison Study: No prior study on file Performing Technologist: Sharion Dove RVS  Examination Guidelines: A complete evaluation includes B-mode imaging, spectral Doppler, color Doppler, and power Doppler as needed of all accessible portions of each vessel. Bilateral testing is considered an integral part of a complete examination. Limited examinations for reoccurring indications may be performed as noted.  +---------+---------------+---------+-----------+----------+--------------+ RIGHT    CompressibilityPhasicitySpontaneityPropertiesThrombus Aging +---------+---------------+---------+-----------+----------+--------------+ CFV      Full           Yes      Yes                                 +---------+---------------+---------+-----------+----------+--------------+ SFJ      Full                                                        +---------+---------------+---------+-----------+----------+--------------+  FV Prox  Full                                                        +---------+---------------+---------+-----------+----------+--------------+ FV Mid   Full                                                        +---------+---------------+---------+-----------+----------+--------------+ FV DistalFull                                                        +---------+---------------+---------+-----------+----------+--------------+ PFV      Full                                                         +---------+---------------+---------+-----------+----------+--------------+ POP      Partial                                      Acute          +---------+---------------+---------+-----------+----------+--------------+ PTV      None                                         Acute          +---------+---------------+---------+-----------+----------+--------------+ PERO                                                  Not visualized +---------+---------------+---------+-----------+----------+--------------+ Gastroc  None                                         Acute          +---------+---------------+---------+-----------+----------+--------------+   +---------+---------------+---------+-----------+----------+--------------+ LEFT     CompressibilityPhasicitySpontaneityPropertiesThrombus Aging +---------+---------------+---------+-----------+----------+--------------+ CFV      Full           Yes      Yes                                 +---------+---------------+---------+-----------+----------+--------------+ SFJ      Full                                                        +---------+---------------+---------+-----------+----------+--------------+ FV Prox  Full                                                        +---------+---------------+---------+-----------+----------+--------------+  FV Mid   Full                                                        +---------+---------------+---------+-----------+----------+--------------+ FV DistalFull                                                        +---------+---------------+---------+-----------+----------+--------------+ PFV      Full                                                        +---------+---------------+---------+-----------+----------+--------------+ POP      None                                         Acute           +---------+---------------+---------+-----------+----------+--------------+ PTV      None                                         Acute          +---------+---------------+---------+-----------+----------+--------------+ PERO                                                  Not visualized +---------+---------------+---------+-----------+----------+--------------+ Gastroc  None                                         Acute          +---------+---------------+---------+-----------+----------+--------------+     Summary: Right: Findings consistent with acute deep vein thrombosis involving the right popliteal vein, right posterior tibial veins, and right gastrocnemius veins. Left: Findings consistent with acute deep vein thrombosis involving the left popliteal vein, left posterior tibial veins, and left gastrocnemius veins.  *See table(s) above for measurements and observations. Electronically signed by Monica Martinez MD on 04/20/2019 at 5:10:07 PM.    Final

## 2019-04-25 NOTE — Progress Notes (Signed)
Physical Therapy Treatment Patient Details Name: Nathan Grant MRN: [id number] DOB: Jul 05, 1945 Today's Date: 04/25/2019    History of Present Illness 74 year old male admitted with COVID- PMH includes HTN, CHronic DIastolic CHF, B/ LE DVT    PT Comments    Pt able to amb short distance today with walker but unsteady and with HR to 140's and SpO2 86% on RA. Continue to work toward [place] and safety prior to return home.    Follow Up Recommendations  Home health PT;Supervision for mobility/OOB     Equipment Recommendations  Rolling walker with 5" wheels    Recommendations for Other Services       Precautions / Restrictions Precautions Precautions: Fall;Other (comment) Precaution Comments: Monitor HR Restrictions Weight Bearing Restrictions: No    Mobility  Bed Mobility Overal bed mobility: Needs Assistance Bed Mobility: Supine to Sit     Supine to sit: Supervision;HOB elevated     General bed mobility comments: Incr time and use of bedrail  Transfers Overall transfer level: Needs assistance Equipment used: Rolling walker (2 wheeled) Transfers: Sit to/from Stand Sit to Stand: Min guard         General transfer comment: Assist for safety and lines  Ambulation/Gait Ambulation/Gait assistance: Min assist Gait Distance (Feet): 25 Feet Assistive device: Rolling walker (2 wheeled) Gait Pattern/deviations: Step-through pattern;Decreased step length - right;Decreased step length - left;Narrow base of support Gait velocity: decr Gait velocity interpretation: <1.31 ft/sec, indicative of household ambulator General Gait Details: Unsteady gait with verbal cues to broaden base of support and stay closer to walker. Assist for balance and support. HR to 140's and SpO2 86% on RA.   Stairs             Wheelchair Mobility    Modified Rankin (Stroke Patients Only)       Balance Overall balance assessment: Needs assistance Sitting-balance support:  No upper extremity supported;Feet supported Sitting balance-Leahy Scale: Good     Standing balance support: Single extremity supported;During functional activity Standing balance-Leahy Scale: Poor Standing balance comment: UE support for static standing. Assist for any dynamic activity                            Cognition Arousal/Alertness: Awake/alert Behavior During Therapy: WFL for tasks assessed/performed Overall Cognitive Status: No family/caregiver present to determine baseline cognitive functioning                                 General Comments: At times pt seemed a little slow to progress. Unsure if cognition or hearing issue      Exercises      General Comments General comments (skin integrity, edema, etc.): Pt on RA with SpO2 90-93% at rest and 86% with amb. HR 140's with amb and <100 at rest      Pertinent Vitals/Pain Pain Assessment: No/denies pain    Home Living                      Prior Function            PT Goals (current goals can now be found in the care plan section) Acute Rehab PT Goals Patient Stated Goal: To go home Progress towards PT goals: Progressing toward goals    Frequency    Min 3X/week      PT Plan Current plan remains appropriate  Co-evaluation              AM-PAC PT "6 Clicks" Mobility   Outcome Measure  Help needed turning from your back to your side while in a flat bed without using bedrails?: A Little Help needed moving from lying on your back to sitting on the side of a flat bed without using bedrails?: A Little Help needed moving to and from a bed to a chair (including a wheelchair)?: A Little Help needed standing up from a chair using your arms (e.g., wheelchair or bedside chair)?: A Little Help needed to walk in hospital room?: A Little Help needed climbing 3-5 steps with a railing? : A Lot 6 Click Score: 17    End of Session   Activity Tolerance: Patient limited by  fatigue;Treatment limited secondary to medical complications (Comment)(High HR ) Patient left: in chair;with call bell/phone within reach Nurse Communication: Mobility status PT Visit Diagnosis: Unsteadiness on feet (R26.81);Other abnormalities of gait and mobility (R26.89);Muscle weakness (generalized) (M62.81)     Time: 7092-9574 PT Time Calculation (min) (ACUTE ONLY): 28 min  Charges:  $Gait Training: 23-37 mins                     [place] Pager [phone] [place] 04/25/2019, 2:31 PM

## 2019-04-25 NOTE — TOC Initial Note (Signed)
Transition of Care [facility]) - Initial/Assessment Note    Patient Details  Name: Nathan Grant MRN: [id number] Date of Birth: 1946/03/15  Transition of Care [facility]) CM/SW Contact:    Shade Flood, LCSW Phone Number: 04/25/2019, 1:34 PM  Clinical Narrative:                  Pt from home with wife. MD anticipating dc home in the next few days. Anticipating pt will need HH and O2 at dc.  TOC will follow and make referrals as needed once known.  Expected Discharge Plan: [place] Barriers to Discharge: Continued Medical Work up   Patient Goals and CMS Choice        Expected Discharge Plan and Services Expected Discharge Plan: [place] In-house Referral: Clinical Social Work     Living arrangements for the past 2 months: [place]                                      Prior Living Arrangements/Services Living arrangements for the past 2 months: Single Family Home Lives with:: Spouse Patient language and need for interpreter reviewed:: Yes Do you feel safe going back to the place where you live?: Yes      Need for Family Participation in Patient Care: No (Comment) Care giver support system in place?: Yes (comment)   Criminal Activity/Legal Involvement Pertinent to Current Situation/Hospitalization: No - Comment as needed  Activities of Daily Living Home Assistive Devices/Equipment: None ADL Screening (condition at time of admission) Patient's cognitive ability adequate to safely complete daily activities?: No Is the patient deaf or have difficulty hearing?: No Does the patient have difficulty seeing, even when wearing glasses/contacts?: No Does the patient have difficulty concentrating, remembering, or making decisions?: No Patient able to express need for assistance with ADLs?: Yes Does the patient have difficulty dressing or bathing?: No Independently performs ADLs?: Yes (appropriate for developmental age) Does the  patient have difficulty walking or climbing stairs?: No Weakness of Legs: None Weakness of Arms/Hands: None  Permission Sought/Granted                  Emotional Assessment       Orientation: : Oriented to Self, Oriented to Place, Oriented to  Time, Oriented to Situation Alcohol / Substance Use: Not Applicable Psych Involvement: No (comment)  Admission diagnosis:  Respiratory distress [R06.03] Hypoxia [R09.02] Positive D dimer [R79.89] Troponin level elevated [R77.8] Acute respiratory failure with hypoxia (HCC) [J96.01] Pneumonia due to COVID-19 virus [U07.1, J12.82] COVID-19 [U07.1] Patient Active Problem List   Diagnosis Date Noted  . Pneumonia due to COVID-19 virus 04/19/2019  . Acute respiratory failure due to COVID-19 ([place]) 04/19/2019  . Wheezing 01/05/2019  . Post-nasal drip 01/05/2019  . Type 2 diabetes mellitus with stage 3 chronic kidney disease, with long-term current use of insulin ([place]) 04/28/2018  . Uricacidemia 04/28/2018  . Foot callus 04/28/2018  . Onychodystrophy 04/28/2018  . PND (paroxysmal nocturnal dyspnea) 03/08/2018  . Enuresis 03/08/2018  . Chronic gout due to renal impairment of multiple sites without tophus 03/08/2018  . OSA (obstructive sleep apnea) 03/08/2018  . CKD stage 3 due to type 2 diabetes mellitus ([place]) 03/08/2018  . Uncontrolled hypertension 03/08/2018  . Chronic diastolic CHF (congestive heart failure) ([place]) 09/06/2014  . CKD (chronic kidney disease) stage 3, GFR 30-59 ml/min 09/06/2014  . Hypercholesterolemia 12/21/2013  .  HTN (hypertension) 01/17/2013  . NSVT (nonsustained ventricular tachycardia) ([place]) 01/17/2013  . Accelerated junctional rhythm 01/17/2013  . Sinus bradycardia 01/17/2013  . Diastolic dysfunction 41/99/1444  . Acute on chronic renal failure ([place]) 05/30/2012  . Osteoarthritis   . Hypokalemia   . Erectile dysfunction   . Chronic headaches   . Hyperlipidemia   . Morbid obesity ([place])   . Atrial flutter  ([place])   . Hypertensive heart disease   . Microcytic anemia 03/22/2001   PCP:  Forrest Moron, MD Pharmacy:   [facility], [address] [place] [address] Phone: [phone] Fax: [phone]     Social Determinants of Health (SDOH) Interventions    Readmission Risk Interventions Readmission Risk Prevention Plan 04/25/2019  Transportation Screening Complete  Palliative Care Screening Not Applicable  Medication Review (RN Care Manager) Complete  Some recent data might be hidden

## 2019-04-26 LAB — CBC
HCT: 28.1 % — ABNORMAL LOW (ref 39.0–52.0)
Hemoglobin: 9.1 g/dL — ABNORMAL LOW (ref 13.0–17.0)
MCH: 22.2 pg — ABNORMAL LOW (ref 26.0–34.0)
MCHC: 32.4 g/dL (ref 30.0–36.0)
MCV: 68.7 fL — ABNORMAL LOW (ref 80.0–100.0)
Platelets: 357 10*3/uL (ref 150–400)
RBC: 4.09 MIL/uL — ABNORMAL LOW (ref 4.22–5.81)
RDW: 18.6 % — ABNORMAL HIGH (ref 11.5–15.5)
WBC: 16 10*3/uL — ABNORMAL HIGH (ref 4.0–10.5)
nRBC: 0.5 % — ABNORMAL HIGH (ref 0.0–0.2)

## 2019-04-26 LAB — GLUCOSE, CAPILLARY
Glucose-Capillary: 96 mg/dL (ref 70–99)
Glucose-Capillary: 137 mg/dL — ABNORMAL HIGH (ref 70–99)
Glucose-Capillary: 185 mg/dL — ABNORMAL HIGH (ref 70–99)
Glucose-Capillary: 155 mg/dL — ABNORMAL HIGH (ref 70–99)

## 2019-04-26 LAB — COMPREHENSIVE METABOLIC PANEL
ALT: 24 U/L (ref 0–44)
AST: 19 U/L (ref 15–41)
Albumin: 2.6 g/dL — ABNORMAL LOW (ref 3.5–5.0)
Alkaline Phosphatase: 41 U/L (ref 38–126)
Anion gap: 11 (ref 5–15)
BUN: 123 mg/dL — ABNORMAL HIGH (ref 8–23)
CO2: 20 mmol/L — ABNORMAL LOW (ref 22–32)
Calcium: 8.1 mg/dL — ABNORMAL LOW (ref 8.9–10.3)
Chloride: 105 mmol/L (ref 98–111)
Creatinine, Ser: 3.22 mg/dL — ABNORMAL HIGH (ref 0.61–1.24)
GFR calc Af Amer: 21 mL/min — ABNORMAL LOW (ref >60)
GFR calc non Af Amer: 18 mL/min — ABNORMAL LOW (ref >60)
Glucose, Bld: 113 mg/dL — ABNORMAL HIGH (ref 70–99)
Potassium: 5.1 mmol/L (ref 3.5–5.1)
Sodium: 136 mmol/L (ref 135–145)
Total Bilirubin: 0.8 mg/dL (ref 0.3–1.2)
Total Protein: 5.8 g/dL — ABNORMAL LOW (ref 6.5–8.1)

## 2019-04-26 LAB — HEPARIN LEVEL (UNFRACTIONATED): Heparin Unfractionated: 0.51 IU/mL (ref 0.30–0.70)

## 2019-04-26 LAB — D-DIMER, QUANTITATIVE: D-Dimer, Quant: 17.39 ug/mL-FEU — ABNORMAL HIGH (ref 0.00–0.50)

## 2019-04-26 LAB — MAGNESIUM: Magnesium: 2.7 mg/dL — ABNORMAL HIGH (ref 1.7–2.4)

## 2019-04-26 LAB — FERRITIN: Ferritin: 1119 ng/mL — ABNORMAL HIGH (ref 24–336)

## 2019-04-26 LAB — C-REACTIVE PROTEIN: CRP: 3 mg/dL — ABNORMAL HIGH (ref <1.0)

## 2019-04-26 MED ORDER — PREDNISONE 20 MG PO TABS
20.0000 mg | ORAL_TABLET | Freq: Every day | ORAL | Status: AC
  Administered 2019-04-27: 08:00:00 20 mg via ORAL
  Filled 2019-04-26: qty 1

## 2019-04-26 NOTE — Progress Notes (Signed)
[place] for heparin Indication: pulmonary embolus, b/l DVTs  Allergies  Allergen Reactions  . Ibuprofen   . Tylenol [Acetaminophen]     Patient Measurements: Height: 6\' 1"  (185.4 cm) Weight: (!) 320 lb 15.8 oz (145.6 kg) IBW/kg (Calculated) : 79.9 Heparin Dosing Weight: 115kg (last wt 149.7kg on 03/07/19)  Vital Signs: Temp: 97.6 F (36.4 C) (01/13 0400) Temp Source: Oral (01/13 0400) BP: 167/94 (01/13 0400) Pulse Rate: 66 (01/13 0600)  Labs: Recent Labs    04/24/19 0505 04/25/19 0410 04/25/19 1806 04/26/19 0321  HGB 10.0* 9.7*  --  9.1*  HCT 30.9* 29.2*  --  28.1*  PLT 377 359  --  357  HEPARINUNFRC 0.61 0.73* 0.43 0.51  CREATININE 3.71* 3.57*  --  3.22*    Estimated Creatinine Clearance: 30.2 mL/min (A) (by C-G formula based on SCr of 3.22 mg/dL (H)).   Assessment: [name].  Pharmacy consulted to dose heparin for PE.  No prior AC.  TPA 100mg  given 1/7 2030.  Heparin level remains therapeutic x 2 on 1400 units/hr, H/H low but stable, plts wnl, no bleeding reported.  Goal of Therapy: Heparin level 0.3-0.7 units/ml  Monitor platelets by anticoagulation protocol   Plan:  Continue heparin gtt at 1400 units/hr Daily heparin level, CBC, s/s bleeding  Bertis Ruddy, PharmD Clinical Pharmacist Please check AMION for all [place] numbers 04/26/2019 7:53 AM

## 2019-04-26 NOTE — Evaluation (Signed)
Physical Therapy Treatment Patient Details Name: Nathan Grant MRN: [id number] DOB: 06-29-1945 Today's Date: 04/26/2019    History of Present Illness 74 year old male admitted with COVID- PMH includes HTN, CHronic DIastolic CHF, B/ LE DVT    PT Comments    Pleasant, and cooperative. Of note, he did have some bleeding from his nose- some clots, but mod amount of bright red blood. NOTIFIED RN. He is progressing slowly in strengthening, CORE and LE. Improving in overall bed mobility. Would benefit most from bariatric RW for safety and reduce risk of falls. Practiced IS  With good return demo. Reminded to perform hourly. He was on RA during this session, with stable SPO2 94-95%. Should continue to benefit from skilled PT to further address goals as outlined and promoting optimal functional outcomes.  Follow Up Recommendations  Home health PT;Supervision for mobility/OOB     Equipment Recommendations  Rolling walker with 5" wheels(Bariatric)    Recommendations for Other Services       Precautions / Restrictions Precautions Precautions: Fall Restrictions Weight Bearing Restrictions: No    Mobility  Bed Mobility Overal bed mobility: Modified Independent             General bed mobility comments: Incr time and use of bedrail  Transfers   Equipment used: Rolling walker (2 wheeled) Transfers: Sit to/from Stand Sit to Stand: Min guard Stand pivot transfers: Min guard       General transfer comment: Assist for safety and lines  Ambulation/Gait Ambulation/Gait assistance: Min assist Gait Distance (Feet): 10 Feet(x 2, then positioned in BS chair)   Gait Pattern/deviations: Narrow base of support;Decreased step length - right;Decreased step length - left     General Gait Details: On RA during PT session, SPO2 sats stable 94-95%   Stairs             Wheelchair Mobility    Modified Rankin (Stroke Patients Only)       Balance Overall balance assessment:  Needs assistance Sitting-balance support: No upper extremity supported;Feet supported Sitting balance-Leahy Scale: Good     Standing balance support: Single extremity supported;During functional activity Standing balance-Leahy Scale: Fair                              Cognition Arousal/Alertness: Awake/alert Behavior During Therapy: WFL for tasks assessed/performed Overall Cognitive Status: No family/caregiver present to determine baseline cognitive functioning                                 General Comments: At times pt seemed a little slow to progress. Unsure if cognition or hearing issue      Exercises Total Joint Exercises Ankle Circles/Pumps: AROM;Seated Short Arc Quad: AROM;Seated Hip ABduction/ADduction: AROM;Seated Other Exercises Other Exercises: Practiced IS and able to achieve 1275 for 8 out of 10 attempts. Reminded to practice this every hour- at least 8-10 reps. Good return demo.    General Comments        Pertinent Vitals/Pain Pain Assessment: No/denies pain    Home Living Family/patient expects to be discharged to:: Private residence   Available Help at Discharge: Family;Available 24 hours/day Type of Home: House   Entrance Stairs-Rails: Right Home Layout: Two level;Able to live on main level with bedroom/bathroom Home Equipment: Grab bars - tub/shower      Prior Function Level of Independence: Independent      Comments:  Pt independent in ADLs, IADLs, and mobility. Pt does not ambulate with an assistive device. Pt reports 0 falls in the last 6 months. Pt still drives.   PT Goals (current goals can now be found in the care plan section) Acute Rehab PT Goals Patient Stated Goal: To go home PT Goal Formulation: With patient Time For Goal Achievement: 05/07/19 Potential to Achieve Goals: Good    Frequency    Min 3X/week      PT Plan      Co-evaluation              AM-PAC PT "6 Clicks" Mobility   Outcome  Measure  Help needed turning from your back to your side while in a flat bed without using bedrails?: None Help needed moving from lying on your back to sitting on the side of a flat bed without using bedrails?: None Help needed moving to and from a bed to a chair (including a wheelchair)?: A Little Help needed standing up from a chair using your arms (e.g., wheelchair or bedside chair)?: A Little Help needed to walk in hospital room?: A Little Help needed climbing 3-5 steps with a railing? : A Lot 6 Click Score: 19    End of Session   Activity Tolerance: Patient tolerated treatment well Patient left: in chair;with call bell/phone within reach Nurse Communication: Mobility status PT Visit Diagnosis: Muscle weakness (generalized) (M62.81);Unsteadiness on feet (R26.81)     Time: 0205-0240 PT Time Calculation (min) (ACUTE ONLY): 35 min  Charges:  $Gait Training: 8-22 mins $Therapeutic Exercise: 8-22 mins          [place]ollen Sox, PT # [phone] CGV cell           Casandra Doffing 04/26/2019, 4:56 PM

## 2019-04-26 NOTE — Progress Notes (Signed)
[place] for heparin Indication: pulmonary embolus, b/l DVTs  Allergies  Allergen Reactions  . Ibuprofen   . Tylenol [Acetaminophen]     Patient Measurements: Height: 6\' 1"  (185.4 cm) Weight: (!) 320 lb 15.8 oz (145.6 kg) IBW/kg (Calculated) : 79.9 Heparin Dosing Weight: 115kg (last wt 149.7kg on 03/07/19)  Vital Signs: Temp: 97.6 F (36.4 C) (01/13 0400) Temp Source: Oral (01/13 0400) BP: 167/94 (01/13 0400) Pulse Rate: 66 (01/13 0600)  Labs: Recent Labs    04/24/19 0505 04/25/19 0410 04/25/19 1806 04/26/19 0321  HGB 10.0* 9.7*  --  9.1*  HCT 30.9* 29.2*  --  28.1*  PLT 377 359  --  357  HEPARINUNFRC 0.61 0.73* 0.43 0.51  CREATININE 3.71* 3.57*  --  3.22*    Estimated Creatinine Clearance: 30.2 mL/min (A) (by C-G formula based on SCr of 3.22 mg/dL (H)).   Assessment: [name].  Pharmacy consulted to dose heparin for PE.  No prior AC.  TPA 100mg  given 1/7 2030.  Heparin level remains at goal. No bleeding or line issues per RN.   Goal of Therapy: Heparin level 0.3-0.7 units/ml  Monitor platelets by anticoagulation protocol   Plan:  Continue heparin infusion at 1400 units/hr Monitor daily HL, CBC and s/s of bleeding   Ulice Dash, PharmD, BCPS 04/26/2019 7:38 AM

## 2019-04-26 NOTE — Progress Notes (Signed)
PROGRESS NOTE                                                                                                                                                                                                             Patient Demographics:    Nathan Grant, is a 74 y.o. male, DOB - November 02, 1945, OFB:[id number]  Outpatient Primary MD for the patient is Forrest Moron, MD   Admit date - 04/19/2019   LOS - 7  Chief Complaint  Patient presents with  . Respiratory Distress       Brief Narrative: Patient is a 74 y.o. male with PMHx of HTN, DM-2, CKD stage IIIb, history of PAF-presented to the ED with 3-day history of shortness of breath-found to have acute hypoxic respiratory failure secondary to COVID-19 pneumonia along with AKI.  Patient was subsequently admitted to the hospitalist service at [facility].  Significant events: 1/7>> Doppler lower extremity positive for bilateral DVT 1/7>> TTE with clot in pulmonary artery with moderate RV dysfunction and severe pulmonary hypertension 1/7>> transferred to ICU given TPA 1/8>> oxygenation improved-transfer back to PCU    Subjective:   Breathing improved-renal function very slowly improving.   Assessment  & Plan :   Acute Hypoxic Resp Failure due to pulmonary embolism with RV strain and Covid 19 with concurrent bacterial pneumonia: Remarkable improvement post TPA-remains on room air.  Completed remdesivir on 1/10 and Rocephin on 1/11.  Continue tapering steroids.    Fever: afebrile  O2 requirements:  SpO2: 95 % O2 Flow Rate (L/min): 1 L/min   COVID-19 Labs: Recent Labs    04/24/19 0505 04/25/19 0410 04/26/19 0321  DDIMER >20.00* >20.00* 17.39*  FERRITIN  --  1,117* 1,119*  CRP 3.5* 3.0* 3.0*       Component Value Date/Time   BNP 413.0 (H) 04/19/2019 1820    Recent Labs  Lab 04/19/19 2003 04/20/19 0307  PROCALCITON 0.96 2.16    Lab Results    Component Value Date   SARSCOV2NAA Detected (A) 04/12/2019     COVID-19 Medications: Steroids: 1/6>> Remdesivir:1/6>>1/10  Other medications: Antibiotics: Rocephin: 1/7>> 1/11 Zithromax: 1/7>>1/10  Prone/Incentive Spirometry: encouraged  incentive spirometry use 3-4/hour.  DVT Prophylaxis  :   Heparin gtt  AKI on CKD stage IIIb: Suspect this is hemodynamically mediated in the setting of acute infection with  COVID-19 along with the use of Aldactone and losartan.  UA without proteinuria, renal ultrasound negative for hydronephrosis.  Slowly improving-patient is making good urine.  Creatinine is downtrending however-BUN remains significantly elevated (on steroids/did receive Lasix for the past few days).volume status is relatively stable-continue to hold Lasix today-follow.  Nephrology has signed off.  Hyperkalemia: Resolved-continue Lokelma for a few more days.  Pulmonary embolism with RV strain and bilateral lower extremity Doppler: Due to severe hypoxemia-underwent TPA infusion on 1/7-remains on IV heparin-with plans to transition to oral anticoagulation agent (if renal function continues to improve-and if felt not to require further procedures).    Note echo on 1/7 demonstrated a pulmonary embolus (clot seen in PA) with severe pulmonary hypertension.    HTN: BP relatively stable-continue amlodipine.  Follow and optimize.  DM-2 (A1c 6.4): Continue close monitoring with SSI-follow closely.  CBG (last 3)  Recent Labs    04/25/19 2238 04/26/19 0753 04/26/19 1212  GLUCAP 149* 96 137*    Chronic diastolic heart failure: Remains compensated-has chronic-1+ pitting edema in the lower extremities-continue to hold Lasix today.   PAF: Currently in sinus rhythm-anticoagulated for VTE.  Obesity: Estimated body mass index is 42.35 kg/m as calculated from the following:   Height as of this encounter: 6\' 1"  (1.854 m).   Weight as of this encounter: 145.6 kg.    Consults  :  Nephrology,PCCM  Procedures  :  None  ABG:    Component Value Date/Time   TCO2 28 10/03/2017 1025    Vent Settings: N/A  Condition -stable  Family Communication  : Spouse updated over the phone on 1/13  Code Status :  Full Code  Diet :  Diet Order            Diet Heart Room service appropriate? Yes; Fluid consistency: Thin  Diet effective now               Disposition Plan  :  Remain hospitalized-Home with home health services when closer to discharge.  Barriers to discharge: Further improvement in renal function.  Antimicorbials  :    Anti-infectives (From admission, onward)   Start     Dose/Rate Route Frequency Ordered Stop   04/21/19 1600  remdesivir 100 mg in sodium chloride 0.9 % 100 mL IVPB     100 mg 200 mL/hr over 30 Minutes Intravenous Daily 04/20/19 1658 04/23/19 1105   04/21/19 1000  remdesivir 100 mg in sodium chloride 0.9 % 100 mL IVPB  Status:  Discontinued     100 mg 200 mL/hr over 30 Minutes Intravenous Daily 04/20/19 0525 04/20/19 1658   04/20/19 2000  remdesivir 100 mg in sodium chloride 0.9 % 100 mL IVPB     100 mg 200 mL/hr over 30 Minutes Intravenous  Once 04/20/19 1658 04/20/19 2026   04/20/19 1400  azithromycin (ZITHROMAX) 500 mg in sodium chloride 0.9 % 250 mL IVPB  Status:  Discontinued     500 mg 250 mL/hr over 60 Minutes Intravenous Every 24 hours 04/20/19 1308 04/23/19 1055   04/20/19 1330  cefTRIAXone (ROCEPHIN) 1 g in sodium chloride 0.9 % 100 mL IVPB     1 g 200 mL/hr over 30 Minutes Intravenous Every 24 hours 04/20/19 1308 04/24/19 1416   04/20/19 1000  remdesivir 100 mg in sodium chloride 0.9 % 100 mL IVPB  Status:  Discontinued     100 mg 200 mL/hr over 30 Minutes Intravenous Daily 04/19/19 2015 04/20/19 0525   04/19/19 2200  remdesivir 200 mg in sodium chloride 0.9% 250 mL IVPB     200 mg 580 mL/hr over 30 Minutes Intravenous Once 04/19/19 2015 04/20/19 0302      Inpatient Medications  Scheduled Meds: . amLODipine  5 mg  Oral Daily  . vitamin C  500 mg Oral Daily  . Chlorhexidine Gluconate Cloth  6 each Topical Daily  . insulin aspart  0-9 Units Subcutaneous TID WC  . pantoprazole  40 mg Oral Daily  . predniSONE  30 mg Oral Q breakfast  . sodium zirconium cyclosilicate  10 g Oral Daily  . zinc sulfate  220 mg Oral Daily   Continuous Infusions: . heparin 1,400 Units/hr (04/26/19 0400)   PRN Meds:.albuterol, hydrALAZINE, ondansetron **OR** ondansetron (ZOFRAN) IV, oxymetazoline, polyethylene glycol, sodium chloride   Time Spent in minutes 25  See all Orders from today for further details   Oren Binet M.D on 04/26/2019 at 3:27 PM  To page go to [email or web address] - use universal password  [facility] -  Office  [phone]    Objective:   Vitals:   04/26/19 0500 04/26/19 0600 04/26/19 0931 04/26/19 1343  BP:   (!) 161/79   Pulse: 71 66 92 (!) 106  Resp: 20 (!) 24 (!) 22 16  Temp:   97.7 F (36.5 C)   TempSrc:   Oral   SpO2: 95% 90% 91% 95%  Weight:      Height:        Wt Readings from Last 3 Encounters:  04/25/19 (!) 145.6 kg  03/07/19 (!) 149.7 kg  01/27/19 (!) 147.4 kg     Intake/Output Summary (Last 24 hours) at 04/26/2019 1527 Last data filed at 04/26/2019 1300 Gross per 24 hour  Intake 1000.32 ml  Output 1280 ml  Net -279.68 ml     Physical Exam Gen Exam:Alert awake-not in any distress HEENT:atraumatic, normocephalic Chest: B/L clear to auscultation anteriorly CVS:S1S2 regular Abdomen:soft non tender, non distended Extremities:+ edema Neurology: Non focal Skin: no rash  Data Review:    CBC Recent Labs  Lab 04/20/19 0300 04/21/19 0340 04/22/19 0423 04/23/19 0434 04/24/19 0505 04/25/19 0410 04/26/19 0321  WBC 11.8* 16.1* 19.5* 21.2* 19.0* 17.8* 16.0*  HGB 9.1* 9.1* 9.8* 10.1* 10.0* 9.7* 9.1*  HCT 28.5* 28.4* 30.1* 31.2* 30.9* 29.2* 28.1*  PLT 167 233 293 363 377 359 357  MCV 69.0* 68.4* 69.4* 68.7* 68.8* 68.4* 68.7*  MCH 22.0* 21.9* 22.6* 22.2*  22.3* 22.7* 22.2*  MCHC 31.9 32.0 32.6 32.4 32.4 33.2 32.4  RDW 17.0* 17.0* 17.1* 17.9* 18.6* 18.6* 18.6*  LYMPHSABS 1.1 1.1 1.0 1.1 1.1  --   --   MONOABS 0.4 0.5 0.9 1.1* 1.1*  --   --   EOSABS 0.0 0.0 0.0 0.0 0.0  --   --   BASOSABS 0.0 0.0 0.1 0.1 0.1  --   --     Chemistries  Recent Labs  Lab 04/22/19 0423 04/23/19 0434 04/24/19 0505 04/25/19 0410 04/26/19 0321  NA 137 138 138 139 136  K 5.6* 5.4* 4.7 4.7 5.1  CL 105 103 102 102 105  CO2 15* 22 21* 21* 20*  GLUCOSE 105* 122* 108* 92 113*  BUN 114* 111* 124* 125* 123*  CREATININE 4.05* 3.85* 3.71* 3.57* 3.22*  CALCIUM 7.7* 8.0* 7.6* 8.2* 8.1*  MG  --  2.5*  --   --  2.7*  AST 29 22 17 17 19   ALT 30 27 24 23 24   ALKPHOS 60 52  45 45 41  BILITOT 0.4 0.9 0.7 0.7 0.8   ------------------------------------------------------------------------------------------------------------------ No results for input(s): CHOL, HDL, LDLCALC, TRIG, CHOLHDL, LDLDIRECT in the last 72 hours.  Lab Results  Component Value Date   HGBA1C 6.4 (A) 03/07/2019   ------------------------------------------------------------------------------------------------------------------ No results for input(s): TSH, T4TOTAL, T3FREE, THYROIDAB in the last 72 hours.  Invalid input(s): FREET3 ------------------------------------------------------------------------------------------------------------------ Recent Labs    04/25/19 0410 04/26/19 0321  FERRITIN 1,117* 1,119*    Coagulation profile Recent Labs  Lab 04/19/19 2017 04/20/19 2000  INR 1.6* 2.1*    Recent Labs    04/25/19 0410 04/26/19 0321  DDIMER >20.00* 17.39*    Cardiac Enzymes No results for input(s): CKMB, TROPONINI, MYOGLOBIN in the last 168 hours.  Invalid input(s): CK ------------------------------------------------------------------------------------------------------------------    Component Value Date/Time   BNP 413.0 (H) 04/19/2019 1820    Micro Results Recent  Results (from the past 240 hour(s))  Blood culture (routine x 2)     Status: None   Collection Time: 04/19/19  6:20 PM   Specimen: BLOOD  Result Value Ref Range Status   Specimen Description   Final    BLOOD RIGHT ANTECUBITAL Performed at [facility] [address] [place], [place] [address]    Special Requests   Final    BOTTLES DRAWN AEROBIC ONLY Blood Culture adequate volume   Culture   Final    NO GROWTH 5 DAYS Performed at [facility], [place] [address], [place], [place] [address]    Report Status 04/24/2019 FINAL  Final  Blood culture (routine x 2)     Status: None   Collection Time: 04/19/19  6:35 PM   Specimen: BLOOD  Result Value Ref Range Status   Specimen Description   Final    BLOOD LEFT ANTECUBITAL Performed at [facility] [address], [place], [place] [address]    Special Requests   Final    BOTTLES DRAWN AEROBIC ONLY Blood Culture adequate volume   Culture   Final    NO GROWTH 5 DAYS Performed at [facility], [place] [address], [place], [place] [address]    Report Status 04/24/2019 FINAL  Final  MRSA PCR Screening     Status: None   Collection Time: 04/21/19  4:24 AM   Specimen: Nasal Mucosa; Nasopharyngeal  Result Value Ref Range Status   MRSA by PCR NEGATIVE NEGATIVE Final    Comment:        The GeneXpert MRSA Assay (FDA approved for NASAL specimens only), is one component of a comprehensive MRSA colonization surveillance program. It is not intended to diagnose MRSA infection nor to guide or monitor treatment for MRSA infections. Performed at [facility], [place] [address], [place],  [address]     Radiology Reports US Renal  Result Date: 04/20/2019 CLINICAL DATA:  74 year old male with a history of acute kidney injury EXAM: RENAL / URINARY TRACT ULTRASOUND COMPLETE COMPARISON:  None. FINDINGS: Right Kidney: Length: 11.7 cm x 4.8 cm x 6.1 cm, 181 cc. Echogenicity of the  right renal cortex relatively increased. No hydronephrosis. Flow confirmed in the hilum of the right kidney. No focal echogenic focus. Left Kidney: Length: 10.5 cm x 6.4 cm x 5.4 cm, 190 cc. Echogenicity of the left kidney relatively increased. No hydronephrosis. No focal echogenic focus. Flow confirmed in the hilum. Bladder: Bladder partially distended, otherwise unremarkable IMPRESSION: Negative for hydronephrosis. Mildly increased echogenicity of the bilateral kidneys, indicating medical renal disease. Electronically  Signed   By: Corrie Mckusick D.O.   On: 04/20/2019 11:16   DG Chest Portable 1 View  Result Date: 04/19/2019 CLINICAL DATA:  Hypoxia. EXAM: PORTABLE CHEST 1 VIEW COMPARISON:  December 09, 2013 FINDINGS: Mild hazy infiltrates are seen within the bilateral lung bases. There is no evidence of a pleural effusion or pneumothorax. The cardiac silhouette is mildly enlarged. The visualized skeletal structures are unremarkable. IMPRESSION: Mild hazy bibasilar infiltrates. Electronically Signed   By: Virgina Norfolk M.D.   On: 04/19/2019 18:54   ECHOCARDIOGRAM COMPLETE  Result Date: 04/20/2019   ECHOCARDIOGRAM REPORT   Patient Name:   JERMAIN CURT Date of Exam: 04/20/2019 Medical Rec #:  [id number]        Height:       73.0 in Accession #:    [id number]       Weight:       304.9 lb Date of Birth:  1945/12/05         BSA:          2.57 m Patient Age:    56 years         BP:           127/70 mmHg Patient Gender: M                HR:           86 bpm. Exam Location:  Inpatient Procedure: 2D Echo, Cardiac Doppler and Color Doppler Indications:    R06.02 SOB  History:        Patient has prior history of Echocardiogram examinations, most                 recent 05/29/2012. Abnormal ECG, Pulmonary HTN, Arrythmias:Atrial                 Flutter and NSVT, Signs/Symptoms:Chest Pain and Dyspnea; Risk                 Factors:Sleep Apnea, Hypertension, Diabetes and Dyslipidemia.                 Covid 19 positive.   Sonographer:    Roseanna Rainbow RDCS Referring Phys: [place]  Sonographer Comments: Technically difficult study due to poor echo windows and patient is morbidly obese. Image acquisition challenging due to patient body habitus. IMPRESSIONS  1. Normal LVEF with severe pulmonary hypertension and likely PA thrombus in right PA, suggestive of pulmonary embolism. RV is only midly enlarged. Findings communicated with Dr. Sloan Leiter.  2. Left ventricular ejection fraction, by visual estimation, is 65 to 70%. The left ventricle has hyperdynamic function. There is severely increased left ventricular hypertrophy.  3. Right ventricular pressure overload.  4. The left ventricle has no regional wall motion abnormalities.  5. Global right ventricle has moderately reduced systolic function.The right ventricular size is mildly enlarged. Right vetricular wall thickness was not assessed.  6. Preserved RV apical function, consistent with McConnell's sign. RV size normal to only slightly enlarged depending on imaging window.  7. Left atrial size was mildly dilated.  8. Right atrial size was moderately dilated.  9. Small pericardial effusion. 10. The pericardial effusion is circumferential. 11. The mitral valve is normal in structure. Trivial mitral valve regurgitation. 12. The tricuspid valve is normal in structure. 13. The aortic valve is tricuspid. Aortic valve regurgitation is trivial. Mild aortic valve sclerosis without stenosis. 14. Pulmonic regurgitation is moderate. 15. The pulmonic valve was grossly normal. Pulmonic valve regurgitation is moderate.  16. Severely elevated pulmonary artery systolic pressure. 25. Suspect thromboembolism in the main pulmonary artery. 18. There is a mobile mass seen in the right PA highly suspicious for thrombus. Additional thrombus burden not excluded. 19. The tricuspid regurgitant velocity is 3.74 m/s, and with an assumed right atrial pressure of 15 mmHg, the estimated right ventricular  systolic pressure is severely elevated at 70.8 mmHg. 20. The inferior vena cava is dilated in size with <50% respiratory variability, suggesting right atrial pressure of 15 mmHg. FINDINGS  Left Ventricle: Left ventricular ejection fraction, by visual estimation, is 65 to 70%. The left ventricle has hyperdynamic function. The left ventricle has no regional wall motion abnormalities. There is severely increased left ventricular hypertrophy.  Concentric left ventricular hypertrophy. The interventricular septum is flattened in systole, consistent with right ventricular pressure overload. Right Ventricle: The right ventricular size is mildly enlarged. Right vetricular wall thickness was not assessed. Global RV systolic function is has moderately reduced systolic function. The tricuspid regurgitant velocity is 3.74 m/s, and with an assumed  right atrial pressure of 15 mmHg, the estimated right ventricular systolic pressure is severely elevated at 70.8 mmHg. Preserved RV apical function, consistent with McConnell's sign. RV size normal to only slightly enlarged depending on imaging window. Left Atrium: Left atrial size was mildly dilated. Right Atrium: Right atrial size was moderately dilated Pericardium: A small pericardial effusion is present. The pericardial effusion is circumferential. Mitral Valve: The mitral valve is normal in structure. Trivial mitral valve regurgitation. Tricuspid Valve: The tricuspid valve is normal in structure. Tricuspid valve regurgitation moderate. Aortic Valve: The aortic valve is tricuspid. . There is mild thickening and mild calcification of the aortic valve. Aortic valve regurgitation is trivial. Mild aortic valve sclerosis is present, with no evidence of aortic valve stenosis. There is mild thickening of the aortic valve. There is mild calcification of the aortic valve. Pulmonic Valve: The pulmonic valve was grossly normal. Pulmonic valve regurgitation is moderate. Pulmonic regurgitation  is moderate. Aorta: The aortic root, ascending aorta and aortic arch are all structurally normal, with no evidence of dilitation or obstruction. Pulmonary Artery: Suspect thromboembolism in the main pulmonary artery. There is a mobile mass seen in the right PA highly suspicious for thrombus. Additional thrombus burden not excluded. Venous: The inferior vena cava is dilated in size with less than 50% respiratory variability, suggesting right atrial pressure of 15 mmHg. IAS/Shunts: No atrial level shunt detected by color flow Doppler.  LEFT VENTRICLE PLAX 2D LVIDd:         4.29 cm LVIDs:         2.34 cm LV PW:         2.08 cm LV IVS:        2.18 cm LVOT diam:     2.00 cm LV SV:         64 ml LV SV Index:   23.37 LVOT Area:     3.14 cm  LV Volumes (MOD) LV area d, A2C:    28.70 cm LV area d, A4C:    22.30 cm LV area s, A2C:    15.20 cm LV area s, A4C:    12.20 cm LV major d, A2C:   6.53 cm LV major d, A4C:   7.35 cm LV major s, A2C:   5.76 cm LV major s, A4C:   5.75 cm LV vol d, MOD A2C: 104.0 ml LV vol d, MOD A4C: 59.9 ml LV vol s, MOD A2C: 34.7 ml LV vol  s, MOD A4C: 23.8 ml LV SV MOD A2C:     69.3 ml LV SV MOD A4C:     59.9 ml LV SV MOD BP:      54.1 ml RIGHT VENTRICLE         IVC TAPSE (M-mode): 1.2 cm  IVC diam: 2.23 cm LEFT ATRIUM             Index       RIGHT ATRIUM           Index LA diam:        4.40 cm 1.71 cm/m  RA Area:     29.40 cm LA Vol (A2C):   93.0 ml 36.13 ml/m RA Volume:   108.00 ml 41.96 ml/m LA Vol (A4C):   77.6 ml 30.15 ml/m LA Biplane Vol: 94.3 ml 36.64 ml/m  AORTIC VALVE             PULMONIC VALVE LVOT Vmax:   149.00 cm/s PR End Diast Vel: 2.12 msec LVOT Vmean:  95.400 cm/s LVOT VTI:    0.197 m  AORTA Ao Asc diam: 3.20 cm MITRAL VALVE                        TRICUSPID VALVE MV Area (PHT): 4.78 cm             TR Peak grad:   55.8 mmHg MV PHT:        46.01 msec           TR Vmax:        387.00 cm/s MV Decel Time: 159 msec MV E velocity: 103.53 cm/s 103 cm/s SHUNTS                                      Systemic VTI:  0.20 m                                     Systemic Diam: 2.00 cm  Buford Dresser MD Electronically signed by Buford Dresser MD Signature Date/Time: 04/20/2019/6:30:29 PM    Final    VAS [place] LOWER EXTREMITY VENOUS (DVT)  Result Date: 04/20/2019  Lower Venous Study Indications: Covid-19, elevated D-Dimer.  Limitations: Body habitus. Comparison Study: No prior study on file Performing Technologist: Sharion Dove RVS  Examination Guidelines: A complete evaluation includes B-mode imaging, spectral Doppler, color Doppler, and power Doppler as needed of all accessible portions of each vessel. Bilateral testing is considered an integral part of a complete examination. Limited examinations for reoccurring indications may be performed as noted.  +---------+---------------+---------+-----------+----------+--------------+ RIGHT    CompressibilityPhasicitySpontaneityPropertiesThrombus Aging +---------+---------------+---------+-----------+----------+--------------+ CFV      Full           Yes      Yes                                 +---------+---------------+---------+-----------+----------+--------------+ SFJ      Full                                                        +---------+---------------+---------+-----------+----------+--------------+  FV Prox  Full                                                        +---------+---------------+---------+-----------+----------+--------------+ FV Mid   Full                                                        +---------+---------------+---------+-----------+----------+--------------+ FV DistalFull                                                        +---------+---------------+---------+-----------+----------+--------------+ PFV      Full                                                        +---------+---------------+---------+-----------+----------+--------------+ POP      Partial                                       Acute          +---------+---------------+---------+-----------+----------+--------------+ PTV      None                                         Acute          +---------+---------------+---------+-----------+----------+--------------+ PERO                                                  Not visualized +---------+---------------+---------+-----------+----------+--------------+ Gastroc  None                                         Acute          +---------+---------------+---------+-----------+----------+--------------+   +---------+---------------+---------+-----------+----------+--------------+ LEFT     CompressibilityPhasicitySpontaneityPropertiesThrombus Aging +---------+---------------+---------+-----------+----------+--------------+ CFV      Full           Yes      Yes                                 +---------+---------------+---------+-----------+----------+--------------+ SFJ      Full                                                        +---------+---------------+---------+-----------+----------+--------------+ FV Prox  Full                                                        +---------+---------------+---------+-----------+----------+--------------+  FV Mid   Full                                                        +---------+---------------+---------+-----------+----------+--------------+ FV DistalFull                                                        +---------+---------------+---------+-----------+----------+--------------+ PFV      Full                                                        +---------+---------------+---------+-----------+----------+--------------+ POP      None                                         Acute          +---------+---------------+---------+-----------+----------+--------------+ PTV      None                                         Acute           +---------+---------------+---------+-----------+----------+--------------+ PERO                                                  Not visualized +---------+---------------+---------+-----------+----------+--------------+ Gastroc  None                                         Acute          +---------+---------------+---------+-----------+----------+--------------+     Summary: Right: Findings consistent with acute deep vein thrombosis involving the right popliteal vein, right posterior tibial veins, and right gastrocnemius veins. Left: Findings consistent with acute deep vein thrombosis involving the left popliteal vein, left posterior tibial veins, and left gastrocnemius veins.  *See table(s) above for measurements and observations. Electronically signed by Monica Martinez MD on 04/20/2019 at 5:10:07 PM.    Final

## 2019-04-27 LAB — COMPREHENSIVE METABOLIC PANEL
ALT: 25 U/L (ref 0–44)
AST: 19 U/L (ref 15–41)
Albumin: 2.6 g/dL — ABNORMAL LOW (ref 3.5–5.0)
Alkaline Phosphatase: 38 U/L (ref 38–126)
Anion gap: 10 (ref 5–15)
BUN: 118 mg/dL — ABNORMAL HIGH (ref 8–23)
CO2: 20 mmol/L — ABNORMAL LOW (ref 22–32)
Calcium: 8.2 mg/dL — ABNORMAL LOW (ref 8.9–10.3)
Chloride: 105 mmol/L (ref 98–111)
Creatinine, Ser: 2.93 mg/dL — ABNORMAL HIGH (ref 0.61–1.24)
GFR calc Af Amer: 23 mL/min — ABNORMAL LOW (ref >60)
GFR calc non Af Amer: 20 mL/min — ABNORMAL LOW (ref >60)
Glucose, Bld: 150 mg/dL — ABNORMAL HIGH (ref 70–99)
Potassium: 5.3 mmol/L — ABNORMAL HIGH (ref 3.5–5.1)
Sodium: 135 mmol/L (ref 135–145)
Total Bilirubin: 0.8 mg/dL (ref 0.3–1.2)
Total Protein: 5.8 g/dL — ABNORMAL LOW (ref 6.5–8.1)

## 2019-04-27 LAB — GLUCOSE, CAPILLARY
Glucose-Capillary: 96 mg/dL (ref 70–99)
Glucose-Capillary: 156 mg/dL — ABNORMAL HIGH (ref 70–99)

## 2019-04-27 LAB — CBC
HCT: 27.9 % — ABNORMAL LOW (ref 39.0–52.0)
Hemoglobin: 8.9 g/dL — ABNORMAL LOW (ref 13.0–17.0)
MCH: 22.1 pg — ABNORMAL LOW (ref 26.0–34.0)
MCHC: 31.9 g/dL (ref 30.0–36.0)
MCV: 69.2 fL — ABNORMAL LOW (ref 80.0–100.0)
Platelets: 333 10*3/uL (ref 150–400)
RBC: 4.03 MIL/uL — ABNORMAL LOW (ref 4.22–5.81)
RDW: 18.8 % — ABNORMAL HIGH (ref 11.5–15.5)
WBC: 14.1 10*3/uL — ABNORMAL HIGH (ref 4.0–10.5)
nRBC: 0.3 % — ABNORMAL HIGH (ref 0.0–0.2)

## 2019-04-27 LAB — D-DIMER, QUANTITATIVE: D-Dimer, Quant: 12.56 ug/mL-FEU — ABNORMAL HIGH (ref 0.00–0.50)

## 2019-04-27 LAB — FERRITIN: Ferritin: 1102 ng/mL — ABNORMAL HIGH (ref 24–336)

## 2019-04-27 LAB — C-REACTIVE PROTEIN: CRP: 2.7 mg/dL — ABNORMAL HIGH (ref <1.0)

## 2019-04-27 LAB — HEPARIN LEVEL (UNFRACTIONATED): Heparin Unfractionated: 0.6 IU/mL (ref 0.30–0.70)

## 2019-04-27 MED ORDER — APIXABAN 5 MG PO TABS
5.0000 mg | ORAL_TABLET | Freq: Two times a day (BID) | ORAL | Status: DC
  Administered 2019-04-27 – 2019-04-28 (×3): 5 mg via ORAL
  Filled 2019-04-27 (×3): qty 1

## 2019-04-27 MED ORDER — SODIUM ZIRCONIUM CYCLOSILICATE 10 G PO PACK
10.0000 g | PACK | Freq: Two times a day (BID) | ORAL | Status: DC
  Administered 2019-04-27 – 2019-04-28 (×3): 10 g via ORAL
  Filled 2019-04-27 (×5): qty 1

## 2019-04-27 NOTE — Progress Notes (Signed)
[place] for heparin Indication: pulmonary embolus, b/l DVTs  Allergies  Allergen Reactions  . Ibuprofen   . Tylenol [Acetaminophen]     Patient Measurements: Height: 6\' 1"  (185.4 cm) Weight: (!) 320 lb 15.8 oz (145.6 kg) IBW/kg (Calculated) : 79.9 Heparin Dosing Weight: 115kg (last wt 149.7kg on 03/07/19)  Vital Signs: Temp: 97.8 F (36.6 C) (01/14 0713) Temp Source: Oral (01/14 0713) BP: 141/66 (01/14 0713) Pulse Rate: 84 (01/14 0713)  Labs: Recent Labs    04/25/19 0410 04/25/19 0410 04/25/19 1806 04/26/19 0321 04/27/19 0142  HGB 9.7*   < >  --  9.1* 8.9*  HCT 29.2*  --   --  28.1* 27.9*  PLT 359  --   --  357 333  HEPARINUNFRC 0.73*   < > 0.43 0.51 0.60  CREATININE 3.57*  --   --  3.22* 2.93*   < > = values in this interval not displayed.    Estimated Creatinine Clearance: 33.2 mL/min (A) (by C-G formula based on SCr of 2.93 mg/dL (H)).   Assessment: [name].  Pharmacy consulted to dose heparin for PE.  No prior AC.  TPA 100mg  given 1/7 2030.  Heparin level remains at goal. No bleeding or line issues per RN.   Goal of Therapy: Heparin level 0.3-0.7 units/ml  Monitor platelets by anticoagulation protocol   Plan:  Continue heparin infusion at 1400 units/hr Monitor daily HL, CBC and s/s of bleeding   Ulice Dash, PharmD, BCPS 04/27/2019 8:31 AM

## 2019-04-27 NOTE — TOC Progression Note (Signed)
Transition of Care [facility]) - Progression Note    Patient Details  Name: Anselm Aumiller MRN: [id number] Date of Birth: 05-Jan-1946  Transition of [facility] CM/SW Contact  Shade Flood, LCSW Phone Number: 04/27/2019, 4:03 PM  Clinical Narrative:     TOC following. Per MD, pt will be stable for dc home tomorrow. MD has put in orders for [facility] PT[facility]. [facility].  Will follow up tomorrow.   Expected Discharge Plan: [place] Barriers to Discharge: Continued Medical Work up  Expected Discharge Plan and Services Expected Discharge Plan: [place] In-house Referral: Clinical Social Work     Living arrangements for the past 2 months: [place]: PT, OT [place] Agency: [place] ([facility]) Date HH Agency Contacted: 04/27/19 Time [place]: 1603 Representative spoke with at [place]: [place] ([facility]) Interventions    Readmission Risk Interventions Readmission Risk Prevention Plan 04/27/2019 04/25/2019  Transportation Screening - Complete  HRI or Home Care Consult Complete -  Palliative Care Screening - Not Applicable  Medication Review (RN Care Manager) - Complete  Some recent data might be hidden

## 2019-04-27 NOTE — Progress Notes (Signed)
Call from [place] in pharmacy to verify no s/s of bleeding with pt. Adv no issues with pt given in report. Pharmacy sts lab results show therapeutic level and to keep Heparin drip at current rate,  will recheck levels in the am.

## 2019-04-27 NOTE — Progress Notes (Signed)
Patient transported to unit via wheelchair. A&Ox4, no complaints at this time. Belongings bedside. Call light in reach.

## 2019-04-27 NOTE — Progress Notes (Signed)
Wife Fredda Hammed updated by phone.

## 2019-04-27 NOTE — Progress Notes (Addendum)
PROGRESS NOTE                                                                                                                                                                                                             Patient Demographics:    Nathan Grant, is a 74 y.o. male, DOB - Jan 29, 1946, XHB:[id number]  Outpatient Primary MD for the patient is Forrest Moron, MD   Admit date - 04/19/2019   LOS - 8  Chief Complaint  Patient presents with  . Respiratory Distress       Brief Narrative: Patient is a 74 y.o. male with PMHx of HTN, DM-2, CKD stage IIIb, history of PAF-presented to the ED with 3-day history of shortness of breath-found to have acute hypoxic respiratory failure secondary to COVID-19 pneumonia along with AKI.  Patient was subsequently admitted to the hospitalist service at [facility].  Significant events: 1/7>> Doppler lower extremity positive for bilateral DVT 1/7>> TTE with clot in pulmonary artery with moderate RV dysfunction and severe pulmonary hypertension 1/7>> transferred to ICU given TPA 1/8>> oxygenation improved-transfer back to PCU    Subjective:   Remains on room air-renal function continues to improve slowly.   Assessment  & Plan :   Acute Hypoxic Resp Failure due to pulmonary embolism with RV strain and Covid 19 with concurrent bacterial pneumonia: Remarkable improvement post TPA-remains on room air.  Completed remdesivir on 1/10 and Rocephin on 1/11.  Will finish steroids on 1/14.  Assess for home O2 requirement prior to discharge.  Fever: afebrile  O2 requirements:  SpO2: 97 % O2 Flow Rate (L/min): 1 L/min   COVID-19 Labs: Recent Labs    04/25/19 0410 04/26/19 0321 04/27/19 0142  DDIMER >20.00* 17.39* 12.56*  FERRITIN 1,117* 1,119* 1,102*  CRP 3.0* 3.0* 2.7*       Component Value Date/Time   BNP 413.0 (H) 04/19/2019 1820    No results for input(s):  PROCALCITON in the last 168 hours.  Lab Results  Component Value Date   SARSCOV2NAA Detected (A) 04/12/2019     COVID-19 Medications: Steroids: 1/6>> 1/14 Remdesivir:1/6>>1/10  Other medications: Antibiotics: Rocephin: 1/7>> 1/11 Zithromax: 1/7>>1/10  Prone/Incentive Spirometry: encouraged  incentive spirometry use 3-4/hour.  DVT Prophylaxis  :   Heparin gtt  AKI on CKD stage IIIb: Suspect this is hemodynamically mediated in  the setting of acute infection with COVID-19 along with the use of Aldactone and losartan.  UA without proteinuria, renal ultrasound negative for hydronephrosis.  Slowly improving-patient is making good urine.  Creatinine is downtrending however-BUN remains significantly elevated (on steroids/did receive Lasix for the past few days).volume status is relatively stable-continue to hold Lasix today-follow.  Nephrology has signed off.  Hyperkalemia: Resolved-continue Lokelma for a few more days.  Pulmonary embolism with RV strain and bilateral lower extremity Doppler: Due to severe hypoxemia-underwent TPA infusion on 1/7-subsequently remained on IV heparin.  Spoke with pharmacy today-should be okay to transition to Eliquis today.  Note echo on 1/7 demonstrated a pulmonary embolus (clot seen in PA) with severe pulmonary hypertension.    HTN: BP relatively stable-continue amlodipine.  Follow and optimize.  DM-2 (A1c 6.4): Continue close monitoring with SSI-follow closely.  CBG (last 3)  Recent Labs    04/26/19 1606 04/26/19 1906 04/27/19 0710  GLUCAP 185* 155* 96    Chronic diastolic heart failure: Remains compensated-has chronic-1+ pitting edema in the lower extremities-continue to hold Lasix today.   PAF: Rate controlled-now on anticoagulation.  Anemia: Has anemia of CKD at baseline-worsened due to acute illness-however hemoglobin stable over the past few days..  No evidence of GI bleeding.  Obesity: Estimated body mass index is 42.35 kg/m as calculated  from the following:   Height as of this encounter: 6\' 1"  (1.854 m).   Weight as of this encounter: 145.6 kg.    Consults  : Nephrology,PCCM  Procedures  :  None  ABG:    Component Value Date/Time   TCO2 28 10/03/2017 1025    Vent Settings: N/A  Condition -stable  Family Communication  : Spouse updated over the phone on 1/14  Code Status :  Full Code  Diet :  Diet Order            Diet Heart Room service appropriate? Yes; Fluid consistency: Thin  Diet effective now               Disposition Plan  :  Remain hospitalized-Home with home health services-hopefully discharge on 1/15 if clinical improvement continues  Barriers to discharge: Further improvement in renal function.  Antimicorbials  :    Anti-infectives (From admission, onward)   Start     Dose/Rate Route Frequency Ordered Stop   04/21/19 1600  remdesivir 100 mg in sodium chloride 0.9 % 100 mL IVPB     100 mg 200 mL/hr over 30 Minutes Intravenous Daily 04/20/19 1658 04/23/19 1105   04/21/19 1000  remdesivir 100 mg in sodium chloride 0.9 % 100 mL IVPB  Status:  Discontinued     100 mg 200 mL/hr over 30 Minutes Intravenous Daily 04/20/19 0525 04/20/19 1658   04/20/19 2000  remdesivir 100 mg in sodium chloride 0.9 % 100 mL IVPB     100 mg 200 mL/hr over 30 Minutes Intravenous  Once 04/20/19 1658 04/20/19 2026   04/20/19 1400  azithromycin (ZITHROMAX) 500 mg in sodium chloride 0.9 % 250 mL IVPB  Status:  Discontinued     500 mg 250 mL/hr over 60 Minutes Intravenous Every 24 hours 04/20/19 1308 04/23/19 1055   04/20/19 1330  cefTRIAXone (ROCEPHIN) 1 g in sodium chloride 0.9 % 100 mL IVPB     1 g 200 mL/hr over 30 Minutes Intravenous Every 24 hours 04/20/19 1308 04/24/19 1416   04/20/19 1000  remdesivir 100 mg in sodium chloride 0.9 % 100 mL IVPB  Status:  Discontinued     100 mg 200 mL/hr over 30 Minutes Intravenous Daily 04/19/19 2015 04/20/19 0525   04/19/19 2200  remdesivir 200 mg in sodium chloride 0.9%  250 mL IVPB     200 mg 580 mL/hr over 30 Minutes Intravenous Once 04/19/19 2015 04/20/19 0302      Inpatient Medications  Scheduled Meds: . amLODipine  5 mg Oral Daily  . apixaban  5 mg Oral BID  . vitamin C  500 mg Oral Daily  . Chlorhexidine Gluconate Cloth  6 each Topical Daily  . insulin aspart  0-9 Units Subcutaneous TID WC  . pantoprazole  40 mg Oral Daily  . sodium zirconium cyclosilicate  10 g Oral BID  . zinc sulfate  220 mg Oral Daily   Continuous Infusions:  PRN Meds:.albuterol, hydrALAZINE, ondansetron **OR** ondansetron (ZOFRAN) IV, oxymetazoline, polyethylene glycol, sodium chloride   Time Spent in minutes 25  See all Orders from today for further details   Oren Binet M.D on 04/27/2019 at 11:42 AM  To page go to [email or web address] - use universal password  [facility] -  Office  [phone]    Objective:   Vitals:   04/26/19 1900 04/27/19 0036 04/27/19 0328 04/27/19 0713  BP: (!) 155/78 (!) 157/79 (!) 149/78 (!) 141/66  Pulse: 70 70 76 84  Resp: (!) 23 (!) 23 20 20   Temp: 98.1 F (36.7 C) 97.8 F (36.6 C) 98.3 F (36.8 C) 97.8 F (36.6 C)  TempSrc: Oral Axillary Oral Oral  SpO2: 94% 95% 95% 97%  Weight:      Height:        Wt Readings from Last 3 Encounters:  04/25/19 (!) 145.6 kg  03/07/19 (!) 149.7 kg  01/27/19 (!) 147.4 kg     Intake/Output Summary (Last 24 hours) at 04/27/2019 1142 Last data filed at 04/27/2019 1053 Gross per 24 hour  Intake 1609.38 ml  Output 2160 ml  Net -550.62 ml     Physical Exam Gen Exam:Alert awake-not in any distress HEENT:atraumatic, normocephalic Chest: B/L clear to auscultation anteriorly CVS:S1S2 regular Abdomen:soft non tender, non distended Extremities:+ edema Neurology: Non focal Skin: no rash   Data Review:    CBC Recent Labs  Lab 04/21/19 0340 04/21/19 0340 04/22/19 0423 04/22/19 0423 04/23/19 0434 04/24/19 0505 04/25/19 0410 04/26/19 0321 04/27/19 0142  WBC 16.1*   <  > 19.5*   < > 21.2* 19.0* 17.8* 16.0* 14.1*  HGB 9.1*   < > 9.8*   < > 10.1* 10.0* 9.7* 9.1* 8.9*  HCT 28.4*   < > 30.1*   < > 31.2* 30.9* 29.2* 28.1* 27.9*  PLT 233   < > 293   < > 363 377 359 357 333  MCV 68.4*   < > 69.4*   < > 68.7* 68.8* 68.4* 68.7* 69.2*  MCH 21.9*   < > 22.6*   < > 22.2* 22.3* 22.7* 22.2* 22.1*  MCHC 32.0   < > 32.6   < > 32.4 32.4 33.2 32.4 31.9  RDW 17.0*   < > 17.1*   < > 17.9* 18.6* 18.6* 18.6* 18.8*  LYMPHSABS 1.1  --  1.0  --  1.1 1.1  --   --   --   MONOABS 0.5  --  0.9  --  1.1* 1.1*  --   --   --   EOSABS 0.0  --  0.0  --  0.0 0.0  --   --   --  BASOSABS 0.0  --  0.1  --  0.1 0.1  --   --   --    < > = values in this interval not displayed.    Chemistries  Recent Labs  Lab 04/23/19 0434 04/24/19 0505 04/25/19 0410 04/26/19 0321 04/27/19 0142  NA 138 138 139 136 135  K 5.4* 4.7 4.7 5.1 5.3*  CL 103 102 102 105 105  CO2 22 21* 21* 20* 20*  GLUCOSE 122* 108* 92 113* 150*  BUN 111* 124* 125* 123* 118*  CREATININE 3.85* 3.71* 3.57* 3.22* 2.93*  CALCIUM 8.0* 7.6* 8.2* 8.1* 8.2*  MG 2.5*  --   --  2.7*  --   AST 22 17 17 19 19   ALT 27 24 23 24 25   ALKPHOS 52 45 45 41 38  BILITOT 0.9 0.7 0.7 0.8 0.8   ------------------------------------------------------------------------------------------------------------------ No results for input(s): CHOL, HDL, LDLCALC, TRIG, CHOLHDL, LDLDIRECT in the last 72 hours.  Lab Results  Component Value Date   HGBA1C 6.4 (A) 03/07/2019   ------------------------------------------------------------------------------------------------------------------ No results for input(s): TSH, T4TOTAL, T3FREE, THYROIDAB in the last 72 hours.  Invalid input(s): FREET3 ------------------------------------------------------------------------------------------------------------------ Recent Labs    04/26/19 0321 04/27/19 0142  FERRITIN 1,119* 1,102*    Coagulation profile Recent Labs  Lab 04/20/19 2000  INR 2.1*     Recent Labs    04/26/19 0321 04/27/19 0142  DDIMER 17.39* 12.56*    Cardiac Enzymes No results for input(s): CKMB, TROPONINI, MYOGLOBIN in the last 168 hours.  Invalid input(s): CK ------------------------------------------------------------------------------------------------------------------    Component Value Date/Time   BNP 413.0 (H) 04/19/2019 1820    Micro Results Recent Results (from the past 240 hour(s))  Blood culture (routine x 2)     Status: None   Collection Time: 04/19/19  6:20 PM   Specimen: BLOOD  Result Value Ref Range Status   Specimen Description   Final    BLOOD RIGHT ANTECUBITAL Performed at [facility] [address] [place], [place] [address]    Special Requests   Final    BOTTLES DRAWN AEROBIC ONLY Blood Culture adequate volume   Culture   Final    NO GROWTH 5 DAYS Performed at [facility], [place] [address] [place], [place] [address]    Report Status 04/24/2019 FINAL  Final  Blood culture (routine x 2)     Status: None   Collection Time: 04/19/19  6:35 PM   Specimen: BLOOD  Result Value Ref Range Status   Specimen Description   Final    BLOOD LEFT ANTECUBITAL Performed at [facility] [address] [place], [place] [address]    Special Requests   Final    BOTTLES DRAWN AEROBIC ONLY Blood Culture adequate volume   Culture   Final    NO GROWTH 5 DAYS Performed at [facility], [place] [address] [place], [place] [address]    Report Status 04/24/2019 FINAL  Final  MRSA PCR Screening     Status: None   Collection Time: 04/21/19  4:24 AM   Specimen: Nasal Mucosa; Nasopharyngeal  Result Value Ref Range Status   MRSA by PCR NEGATIVE NEGATIVE Final    Comment:        The GeneXpert MRSA Assay (FDA approved for NASAL specimens only), is one component of a comprehensive MRSA colonization surveillance program. It is not intended to diagnose MRSA infection nor to guide or monitor  treatment for MRSA infections. Performed at [facility]  [place] [address], [place], [place] [address]     Radiology Reports US Renal  Result Date: 04/20/2019 CLINICAL DATA:  74 year old male with a history of acute kidney injury EXAM: RENAL / URINARY TRACT ULTRASOUND COMPLETE COMPARISON:  None. FINDINGS: Right Kidney: Length: 11.7 cm x 4.8 cm x 6.1 cm, 181 cc. Echogenicity of the right renal cortex relatively increased. No hydronephrosis. Flow confirmed in the hilum of the right kidney. No focal echogenic focus. Left Kidney: Length: 10.5 cm x 6.4 cm x 5.4 cm, 190 cc. Echogenicity of the left kidney relatively increased. No hydronephrosis. No focal echogenic focus. Flow confirmed in the hilum. Bladder: Bladder partially distended, otherwise unremarkable IMPRESSION: Negative for hydronephrosis. Mildly increased echogenicity of the bilateral kidneys, indicating medical renal disease. Electronically Signed   By: Corrie Mckusick D.O.   On: 04/20/2019 11:16   DG Chest Portable 1 View  Result Date: 04/19/2019 CLINICAL DATA:  Hypoxia. EXAM: PORTABLE CHEST 1 VIEW COMPARISON:  December 09, 2013 FINDINGS: Mild hazy infiltrates are seen within the bilateral lung bases. There is no evidence of a pleural effusion or pneumothorax. The cardiac silhouette is mildly enlarged. The visualized skeletal structures are unremarkable. IMPRESSION: Mild hazy bibasilar infiltrates. Electronically Signed   By: Virgina Norfolk M.D.   On: 04/19/2019 18:54   ECHOCARDIOGRAM COMPLETE  Result Date: 04/20/2019   ECHOCARDIOGRAM REPORT   Patient Name:   Nathan Grant Date of Exam: 04/20/2019 Medical Rec #:  [id number]        Height:       73.0 in Accession #:    [id number]       Weight:       304.9 lb Date of Birth:  Feb 19, 1946         BSA:          2.57 m Patient Age:    71 years         BP:           127/70 mmHg Patient Gender: M                HR:           86 bpm. Exam Location:  Inpatient Procedure: 2D Echo, Cardiac  Doppler and Color Doppler Indications:    R06.02 SOB  History:        Patient has prior history of Echocardiogram examinations, most                 recent 05/29/2012. Abnormal ECG, Pulmonary HTN, Arrythmias:Atrial                 Flutter and NSVT, Signs/Symptoms:Chest Pain and Dyspnea; Risk                 Factors:Sleep Apnea, Hypertension, Diabetes and Dyslipidemia.                 Covid 19 positive.  Sonographer:    Roseanna Rainbow RDCS Referring Phys: [place]  Sonographer Comments: Technically difficult study due to poor echo windows and patient is morbidly obese. Image acquisition challenging due to patient body habitus. IMPRESSIONS  1. Normal LVEF with severe pulmonary hypertension and likely PA thrombus in right PA, suggestive of pulmonary embolism. RV is only midly enlarged. Findings communicated with Dr. Sloan Leiter.  2. Left ventricular ejection fraction, by visual estimation, is 65 to 70%. The left ventricle has hyperdynamic function. There is severely increased left ventricular hypertrophy.  3. Right ventricular pressure overload.  4.  The left ventricle has no regional wall motion abnormalities.  5. Global right ventricle has moderately reduced systolic function.The right ventricular size is mildly enlarged. Right vetricular wall thickness was not assessed.  6. Preserved RV apical function, consistent with McConnell's sign. RV size normal to only slightly enlarged depending on imaging window.  7. Left atrial size was mildly dilated.  8. Right atrial size was moderately dilated.  9. Small pericardial effusion. 10. The pericardial effusion is circumferential. 11. The mitral valve is normal in structure. Trivial mitral valve regurgitation. 12. The tricuspid valve is normal in structure. 13. The aortic valve is tricuspid. Aortic valve regurgitation is trivial. Mild aortic valve sclerosis without stenosis. 14. Pulmonic regurgitation is moderate. 15. The pulmonic valve was grossly normal. Pulmonic valve  regurgitation is moderate. 16. Severely elevated pulmonary artery systolic pressure. 57. Suspect thromboembolism in the main pulmonary artery. 18. There is a mobile mass seen in the right PA highly suspicious for thrombus. Additional thrombus burden not excluded. 19. The tricuspid regurgitant velocity is 3.74 m/s, and with an assumed right atrial pressure of 15 mmHg, the estimated right ventricular systolic pressure is severely elevated at 70.8 mmHg. 20. The inferior vena cava is dilated in size with <50% respiratory variability, suggesting right atrial pressure of 15 mmHg. FINDINGS  Left Ventricle: Left ventricular ejection fraction, by visual estimation, is 65 to 70%. The left ventricle has hyperdynamic function. The left ventricle has no regional wall motion abnormalities. There is severely increased left ventricular hypertrophy.  Concentric left ventricular hypertrophy. The interventricular septum is flattened in systole, consistent with right ventricular pressure overload. Right Ventricle: The right ventricular size is mildly enlarged. Right vetricular wall thickness was not assessed. Global RV systolic function is has moderately reduced systolic function. The tricuspid regurgitant velocity is 3.74 m/s, and with an assumed  right atrial pressure of 15 mmHg, the estimated right ventricular systolic pressure is severely elevated at 70.8 mmHg. Preserved RV apical function, consistent with McConnell's sign. RV size normal to only slightly enlarged depending on imaging window. Left Atrium: Left atrial size was mildly dilated. Right Atrium: Right atrial size was moderately dilated Pericardium: A small pericardial effusion is present. The pericardial effusion is circumferential. Mitral Valve: The mitral valve is normal in structure. Trivial mitral valve regurgitation. Tricuspid Valve: The tricuspid valve is normal in structure. Tricuspid valve regurgitation moderate. Aortic Valve: The aortic valve is tricuspid. .  There is mild thickening and mild calcification of the aortic valve. Aortic valve regurgitation is trivial. Mild aortic valve sclerosis is present, with no evidence of aortic valve stenosis. There is mild thickening of the aortic valve. There is mild calcification of the aortic valve. Pulmonic Valve: The pulmonic valve was grossly normal. Pulmonic valve regurgitation is moderate. Pulmonic regurgitation is moderate. Aorta: The aortic root, ascending aorta and aortic arch are all structurally normal, with no evidence of dilitation or obstruction. Pulmonary Artery: Suspect thromboembolism in the main pulmonary artery. There is a mobile mass seen in the right PA highly suspicious for thrombus. Additional thrombus burden not excluded. Venous: The inferior vena cava is dilated in size with less than 50% respiratory variability, suggesting right atrial pressure of 15 mmHg. IAS/Shunts: No atrial level shunt detected by color flow Doppler.  LEFT VENTRICLE PLAX 2D LVIDd:         4.29 cm LVIDs:         2.34 cm LV PW:         2.08 cm LV IVS:  2.18 cm LVOT diam:     2.00 cm LV SV:         64 ml LV SV Index:   23.37 LVOT Area:     3.14 cm  LV Volumes (MOD) LV area d, A2C:    28.70 cm LV area d, A4C:    22.30 cm LV area s, A2C:    15.20 cm LV area s, A4C:    12.20 cm LV major d, A2C:   6.53 cm LV major d, A4C:   7.35 cm LV major s, A2C:   5.76 cm LV major s, A4C:   5.75 cm LV vol d, MOD A2C: 104.0 ml LV vol d, MOD A4C: 59.9 ml LV vol s, MOD A2C: 34.7 ml LV vol s, MOD A4C: 23.8 ml LV SV MOD A2C:     69.3 ml LV SV MOD A4C:     59.9 ml LV SV MOD BP:      54.1 ml RIGHT VENTRICLE         IVC TAPSE (M-mode): 1.2 cm  IVC diam: 2.23 cm LEFT ATRIUM             Index       RIGHT ATRIUM           Index LA diam:        4.40 cm 1.71 cm/m  RA Area:     29.40 cm LA Vol (A2C):   93.0 ml 36.13 ml/m RA Volume:   108.00 ml 41.96 ml/m LA Vol (A4C):   77.6 ml 30.15 ml/m LA Biplane Vol: 94.3 ml 36.64 ml/m  AORTIC VALVE              PULMONIC VALVE LVOT Vmax:   149.00 cm/s PR End Diast Vel: 2.12 msec LVOT Vmean:  95.400 cm/s LVOT VTI:    0.197 m  AORTA Ao Asc diam: 3.20 cm MITRAL VALVE                        TRICUSPID VALVE MV Area (PHT): 4.78 cm             TR Peak grad:   55.8 mmHg MV PHT:        46.01 msec           TR Vmax:        387.00 cm/s MV Decel Time: 159 msec MV E velocity: 103.53 cm/s 103 cm/s SHUNTS                                     Systemic VTI:  0.20 m                                     Systemic Diam: 2.00 cm  Buford Dresser MD Electronically signed by Buford Dresser MD Signature Date/Time: 04/20/2019/6:30:29 PM    Final    VAS [place] LOWER EXTREMITY VENOUS (DVT)  Result Date: 04/20/2019  Lower Venous Study Indications: Covid-19, elevated D-Dimer.  Limitations: Body habitus. Comparison Study: No prior study on file Performing Technologist: Sharion Dove RVS  Examination Guidelines: A complete evaluation includes B-mode imaging, spectral Doppler, color Doppler, and power Doppler as needed of all accessible portions of each vessel. Bilateral testing is considered an integral part of a complete examination. Limited examinations for reoccurring indications may be  performed as noted.  +---------+---------------+---------+-----------+----------+--------------+ RIGHT    CompressibilityPhasicitySpontaneityPropertiesThrombus Aging +---------+---------------+---------+-----------+----------+--------------+ CFV      Full           Yes      Yes                                 +---------+---------------+---------+-----------+----------+--------------+ SFJ      Full                                                        +---------+---------------+---------+-----------+----------+--------------+ FV Prox  Full                                                        +---------+---------------+---------+-----------+----------+--------------+ FV Mid   Full                                                         +---------+---------------+---------+-----------+----------+--------------+ FV DistalFull                                                        +---------+---------------+---------+-----------+----------+--------------+ PFV      Full                                                        +---------+---------------+---------+-----------+----------+--------------+ POP      Partial                                      Acute          +---------+---------------+---------+-----------+----------+--------------+ PTV      None                                         Acute          +---------+---------------+---------+-----------+----------+--------------+ PERO                                                  Not visualized +---------+---------------+---------+-----------+----------+--------------+ Gastroc  None                                         Acute          +---------+---------------+---------+-----------+----------+--------------+   +---------+---------------+---------+-----------+----------+--------------+ LEFT  CompressibilityPhasicitySpontaneityPropertiesThrombus Aging +---------+---------------+---------+-----------+----------+--------------+ CFV      Full           Yes      Yes                                 +---------+---------------+---------+-----------+----------+--------------+ SFJ      Full                                                        +---------+---------------+---------+-----------+----------+--------------+ FV Prox  Full                                                        +---------+---------------+---------+-----------+----------+--------------+ FV Mid   Full                                                        +---------+---------------+---------+-----------+----------+--------------+ FV DistalFull                                                         +---------+---------------+---------+-----------+----------+--------------+ PFV      Full                                                        +---------+---------------+---------+-----------+----------+--------------+ POP      None                                         Acute          +---------+---------------+---------+-----------+----------+--------------+ PTV      None                                         Acute          +---------+---------------+---------+-----------+----------+--------------+ PERO                                                  Not visualized +---------+---------------+---------+-----------+----------+--------------+ Gastroc  None                                         Acute          +---------+---------------+---------+-----------+----------+--------------+     Summary: Right: Findings consistent with acute deep  vein thrombosis involving the right popliteal vein, right posterior tibial veins, and right gastrocnemius veins. Left: Findings consistent with acute deep vein thrombosis involving the left popliteal vein, left posterior tibial veins, and left gastrocnemius veins.  *See table(s) above for measurements and observations. Electronically signed by Monica Martinez MD on 04/20/2019 at 5:10:07 PM.    Final

## 2019-04-27 NOTE — Progress Notes (Signed)
D/C tele per order

## 2019-04-28 LAB — COMPREHENSIVE METABOLIC PANEL
ALT: 25 U/L (ref 0–44)
AST: 21 U/L (ref 15–41)
Albumin: 2.8 g/dL — ABNORMAL LOW (ref 3.5–5.0)
Alkaline Phosphatase: 39 U/L (ref 38–126)
Anion gap: 10 (ref 5–15)
BUN: 98 mg/dL — ABNORMAL HIGH (ref 8–23)
CO2: 21 mmol/L — ABNORMAL LOW (ref 22–32)
Calcium: 8.4 mg/dL — ABNORMAL LOW (ref 8.9–10.3)
Chloride: 105 mmol/L (ref 98–111)
Creatinine, Ser: 2.82 mg/dL — ABNORMAL HIGH (ref 0.61–1.24)
GFR calc Af Amer: 24 mL/min — ABNORMAL LOW (ref >60)
GFR calc non Af Amer: 21 mL/min — ABNORMAL LOW (ref >60)
Glucose, Bld: 107 mg/dL — ABNORMAL HIGH (ref 70–99)
Potassium: 5.1 mmol/L (ref 3.5–5.1)
Sodium: 136 mmol/L (ref 135–145)
Total Bilirubin: 0.7 mg/dL (ref 0.3–1.2)
Total Protein: 6 g/dL — ABNORMAL LOW (ref 6.5–8.1)

## 2019-04-28 LAB — GLUCOSE, CAPILLARY
Glucose-Capillary: 147 mg/dL — ABNORMAL HIGH (ref 70–99)
Glucose-Capillary: 113 mg/dL — ABNORMAL HIGH (ref 70–99)
Glucose-Capillary: 113 mg/dL — ABNORMAL HIGH (ref 70–99)
Glucose-Capillary: 82 mg/dL (ref 70–99)

## 2019-04-28 LAB — HEPARIN LEVEL (UNFRACTIONATED): Heparin Unfractionated: 1.86 IU/mL — ABNORMAL HIGH (ref 0.30–0.70)

## 2019-04-28 MED ORDER — SODIUM ZIRCONIUM CYCLOSILICATE 10 G PO PACK: 10.0000 g | PACK | Freq: Every day | ORAL | 0 refills | Status: AC

## 2019-04-28 MED ORDER — APIXABAN 5 MG PO TABS: 5.0000 mg | ORAL_TABLET | Freq: Two times a day (BID) | ORAL | 0 refills | Status: DC

## 2019-04-28 NOTE — Plan of Care (Signed)
Pt A&Ox4. VSS, SpO2 >88% on RA. Given home O2 tank & SW set up oxygen to be delivered to pt house as well. VSS. Bgs stable not requiring insulin coverage.  Updated wife on plan of care & pt readiness for d/c  Reviewed d/c paperwork w/ pt. Stated understanding & signed CDC form - placed in chart  PIV removed. Pt left unit w/ all belongings  Tomie China    Problem: Education: Goal: Knowledge of General Education information will improve Description: Including pain rating scale, medication(s)/side effects and non-pharmacologic comfort measures Outcome: Adequate for Discharge   Problem: Health Behavior/Discharge Planning: Goal: Ability to manage health-related needs will improve Outcome: Adequate for Discharge   Problem: Clinical Measurements: Goal: Ability to maintain clinical measurements within normal limits will improve Outcome: Adequate for Discharge Goal: Will remain free from infection Outcome: Adequate for Discharge Goal: Diagnostic test results will improve Outcome: Adequate for Discharge Goal: Respiratory complications will improve Outcome: Adequate for Discharge Goal: Cardiovascular complication will be avoided Outcome: Adequate for Discharge   Problem: Activity: Goal: Risk for activity intolerance will decrease Outcome: Adequate for Discharge   Problem: Nutrition: Goal: Adequate nutrition will be maintained Outcome: Adequate for Discharge   Problem: Coping: Goal: Level of anxiety will decrease Outcome: Adequate for Discharge   Problem: Elimination: Goal: Will not experience complications related to bowel motility Outcome: Adequate for Discharge Goal: Will not experience complications related to urinary retention Outcome: Adequate for Discharge   Problem: Pain Managment: Goal: General experience of comfort will improve Outcome: Adequate for Discharge   Problem: Safety: Goal: Ability to remain free from injury will improve Outcome: Adequate for  Discharge   Problem: Skin Integrity: Goal: Risk for impaired skin integrity will decrease Outcome: Adequate for Discharge

## 2019-04-28 NOTE — Discharge Instructions (Signed)
Person Under Monitoring Name: Nathan Grant  Location: [place] [address]   Infection Prevention Recommendations for Individuals Confirmed to have, or Being Evaluated for, 2019 Novel Coronavirus (COVID-19) Infection Who Receive Care at Home  Individuals who are confirmed to have, or are being evaluated for, COVID-19 should follow the prevention steps below until a healthcare provider or local or state health department says they can return to normal activities.  Stay home except to get medical care You should restrict activities outside your home, except for getting medical care. Do not go to work, school, or public areas, and do not use public transportation or taxis.  Call ahead before visiting your doctor Before your medical appointment, call the healthcare provider and tell them that you have, or are being evaluated for, COVID-19 infection. This will help the healthcare providers office take steps to keep other people from getting infected. Ask your healthcare provider to call the local or state health department.  Monitor your symptoms Seek prompt medical attention if your illness is worsening (e.g., difficulty breathing). Before going to your medical appointment, call the healthcare provider and tell them that you have, or are being evaluated for, COVID-19 infection. Ask your healthcare provider to call the local or state health department.  Wear a facemask You should wear a facemask that covers your nose and mouth when you are in the same room with other people and when you visit a healthcare provider. People who live with or visit you should also wear a facemask while they are in the same room with you.  Separate yourself from other people in your home As much as possible, you should stay in a different room from other people in your home. Also, you should use a separate bathroom, if available.  Avoid sharing household items You should not  share dishes, drinking glasses, cups, eating utensils, towels, bedding, or other items with other people in your home. After using these items, you should wash them thoroughly with soap and water.  Cover your coughs and sneezes Cover your mouth and nose with a tissue when you cough or sneeze, or you can cough or sneeze into your sleeve. Throw used tissues in a lined trash can, and immediately wash your hands with soap and water for at least 20 seconds or use an alcohol-based hand rub.  Wash your [facility] your hands often and thoroughly with soap and water for at least 20 seconds. You can use an alcohol-based hand sanitizer if soap and water are not available and if your hands are not visibly dirty. Avoid touching your eyes, nose, and mouth with unwashed hands.   Prevention Steps for Caregivers and Household Members of Individuals Confirmed to have, or Being Evaluated for, COVID-19 Infection Being Cared for in the Home  If you live with, or provide care at home for, a person confirmed to have, or being evaluated for, COVID-19 infection please follow these guidelines to prevent infection:  Follow healthcare providers instructions Make sure that you understand and can help the patient follow any healthcare provider instructions for all care.  Provide for the patients basic needs You should help the patient with basic needs in the home and provide support for getting groceries, prescriptions, and other personal needs.  Monitor the patients symptoms If they are getting sicker, call his or her medical provider and tell them that the patient has, or is being evaluated for, COVID-19 infection. This will help the healthcare providers office take  steps to keep other people from getting infected. Ask the healthcare provider to call the local or state health department.  Limit the number of people who have contact with the patient  If possible, have only one caregiver for the  patient.  Other household members should stay in another home or place of residence. If this is not possible, they should stay  in another room, or be separated from the patient as much as possible. Use a separate bathroom, if available.  Restrict visitors who do not have an essential need to be in the home.  Keep older adults, very young children, and other sick people away from the patient Keep older adults, very young children, and those who have compromised immune systems or chronic health conditions away from the patient. This includes people with chronic heart, lung, or kidney conditions, diabetes, and cancer.  Ensure good ventilation Make sure that shared spaces in the home have good air flow, such as from an air conditioner or an opened window, weather permitting.  Wash your hands often  Wash your hands often and thoroughly with soap and water for at least 20 seconds. You can use an alcohol based hand sanitizer if soap and water are not available and if your hands are not visibly dirty.  Avoid touching your eyes, nose, and mouth with unwashed hands.  Use disposable paper towels to dry your hands. If not available, use dedicated cloth towels and replace them when they become wet.  Wear a facemask and gloves  Wear a disposable facemask at all times in the room and gloves when you touch or have contact with the patients blood, body fluids, and/or secretions or excretions, such as sweat, saliva, sputum, nasal mucus, vomit, urine, or feces.  Ensure the mask fits over your nose and mouth tightly, and do not touch it during use.  Throw out disposable facemasks and gloves after using them. Do not reuse.  Wash your hands immediately after removing your facemask and gloves.  If your personal clothing becomes contaminated, carefully remove clothing and launder. Wash your hands after handling contaminated clothing.  Place all used disposable facemasks, gloves, and other waste in a lined  container before disposing them with other household waste.  Remove gloves and wash your hands immediately after handling these items.  Do not share dishes, glasses, or other household items with the patient  Avoid sharing household items. You should not share dishes, drinking glasses, cups, eating utensils, towels, bedding, or other items with a patient who is confirmed to have, or being evaluated for, COVID-19 infection.  After the person uses these items, you should wash them thoroughly with soap and water.  Wash laundry thoroughly  Immediately remove and wash clothes or bedding that have blood, body fluids, and/or secretions or excretions, such as sweat, saliva, sputum, nasal mucus, vomit, urine, or feces, on them.  Wear gloves when handling laundry from the patient.  Read and follow directions on labels of laundry or clothing items and detergent. In general, wash and dry with the warmest temperatures recommended on the label.  Clean all areas the individual has used often  Clean all touchable surfaces, such as counters, tabletops, doorknobs, bathroom fixtures, toilets, phones, keyboards, tablets, and bedside tables, every day. Also, clean any surfaces that may have blood, body fluids, and/or secretions or excretions on them.  Wear gloves when cleaning surfaces the patient has come in contact with.  Use a diluted bleach solution (e.g., dilute bleach with 1 part bleach  and 10 parts water) or a household disinfectant with a label that says EPA-registered for coronaviruses. To make a bleach solution at home, add 1 tablespoon of bleach to 1 quart (4 cups) of water. For a larger supply, add  cup of bleach to 1 gallon (16 cups) of water.  Read labels of cleaning products and follow recommendations provided on product labels. Labels contain instructions for safe and effective use of the cleaning product including precautions you should take when applying the product, such as wearing gloves or  eye protection and making sure you have good ventilation during use of the product.  Remove gloves and wash hands immediately after cleaning.  Monitor yourself for signs and symptoms of illness Caregivers and household members are considered close contacts, should monitor their health, and will be asked to limit movement outside of the home to the extent possible. Follow the monitoring steps for close contacts listed on the symptom monitoring form.   ? If you have additional questions, contact your local health department or call the epidemiologist on call at [phone] (available 24/7). ? This guidance is subject to change. For the most up-to-date guidance from [employer] please refer to their website: YouBlogs.pl    Apixaban oral tablets What is this medicine? APIXABAN (a PIX a ban) is an anticoagulant (blood thinner). It is used to lower the chance of stroke in people with a medical condition called atrial fibrillation. It is also used to treat or prevent blood clots in the lungs or in the veins. This medicine may be used for other purposes; ask your health care provider or pharmacist if you have questions. COMMON BRAND NAME(S): Eliquis What should I tell my health care provider before I take this medicine? They need to know if you have any of these conditions:  antiphospholipid antibody syndrome  bleeding disorders  bleeding in the brain  blood in your stools (black or tarry stools) or if you have blood in your vomit  history of blood clots  history of stomach bleeding  kidney disease  liver disease  mechanical heart valve  an unusual or allergic reaction to apixaban, other medicines, foods, dyes, or preservatives  pregnant or trying to get pregnant  breast-feeding How should I use this medicine? Take this medicine by mouth with a glass of water. Follow the directions on the prescription label. You can  take it with or without food. If it upsets your stomach, take it with food. Take your medicine at regular intervals. Do not take it more often than directed. Do not stop taking except on your doctor's advice. Stopping this medicine may increase your risk of a blood clot. Be sure to refill your prescription before you run out of medicine. Talk to your pediatrician regarding the use of this medicine in children. Special care may be needed. Overdosage: If you think you have taken too much of this medicine contact a poison control center or emergency room at once. NOTE: This medicine is only for you. Do not share this medicine with others. What if I miss a dose? If you miss a dose, take it as soon as you can. If it is almost time for your next dose, take only that dose. Do not take double or extra doses. What may interact with this medicine? This medicine may interact with the following:  aspirin and aspirin-like medicines  certain medicines for fungal infections like ketoconazole and itraconazole  certain medicines for seizures like carbamazepine and phenytoin  certain medicines that treat  or prevent blood clots like warfarin, enoxaparin, and dalteparin  clarithromycin  NSAIDs, medicines for pain and inflammation, like ibuprofen or naproxen  rifampin  ritonavir  St. John's wort This list may not describe all possible interactions. Give your health care provider a list of all the medicines, herbs, non-prescription drugs, or dietary supplements you use. Also tell them if you smoke, drink alcohol, or use illegal drugs. Some items may interact with your medicine. What should I watch for while using this medicine? Visit your healthcare professional for regular checks on your progress. You may need blood work done while you are taking this medicine. Your condition will be monitored carefully while you are receiving this medicine. It is important not to miss any appointments. Avoid sports and  activities that might cause injury while you are using this medicine. Severe falls or injuries can cause unseen bleeding. Be careful when using sharp tools or knives. Consider using an Copy. Take special care brushing or flossing your teeth. Report any injuries, bruising, or red spots on the skin to your healthcare professional. If you are going to need surgery or other procedure, tell your healthcare professional that you are taking this medicine. Wear a medical ID bracelet or chain. Carry a card that describes your disease and details of your medicine and dosage times. What side effects may I notice from receiving this medicine? Side effects that you should report to your doctor or health care professional as soon as possible:  allergic reactions like skin rash, itching or hives, swelling of the face, lips, or tongue  signs and symptoms of bleeding such as bloody or black, tarry stools; red or dark-brown urine; spitting up blood or brown material that looks like coffee grounds; red spots on the skin; unusual bruising or bleeding from the eye, gums, or nose  signs and symptoms of a blood clot such as chest pain; shortness of breath; pain, swelling, or warmth in the leg  signs and symptoms of a stroke such as changes in vision; confusion; trouble speaking or understanding; severe headaches; sudden numbness or weakness of the face, arm or leg; trouble walking; dizziness; loss of coordination This list may not describe all possible side effects. Call your doctor for medical advice about side effects. You may report side effects to FDA at 1-800-FDA-1088. Where should I keep my medicine? Keep out of the reach of children. Store at room temperature between 20 and 25 degrees C (68 and 77 degrees F). Throw away any unused medicine after the expiration date. NOTE: This sheet is a summary. It may not cover all possible information. If you have questions about this medicine, talk to your doctor,  pharmacist, or health care provider.  2020 Elsevier/Gold Standard (2017-12-08 17:39:34)   Information on my medicine - ELIQUIS (apixaban)  This medication education was reviewed with me or my healthcare representative as part of my discharge preparation.    Why was Eliquis prescribed for you? Eliquis was prescribed to treat blood clots that may have been found in the veins of your legs (deep vein thrombosis) or in your lungs (pulmonary embolism) and to reduce the risk of them occurring again.  What do You need to know about Eliquis ? The dose is ONE 5 mg tablet taken TWICE daily.  Eliquis may be taken with or without food.   Try to take the dose about the same time in the morning and in the evening. If you have difficulty swallowing the tablet whole please discuss with  your pharmacist how to take the medication safely.  Take Eliquis exactly as prescribed and DO NOT stop taking Eliquis without talking to the doctor who prescribed the medication.  Stopping may increase your risk of developing a new blood clot.  Refill your prescription before you run out.  After discharge, you should have regular check-up appointments with your healthcare provider that is prescribing your Eliquis.    What do you do if you miss a dose? If a dose of ELIQUIS is not taken at the scheduled time, take it as soon as possible on the same day and twice-daily administration should be resumed. The dose should not be doubled to make up for a missed dose.  Important Safety Information A possible side effect of Eliquis is bleeding. You should call your healthcare provider right away if you experience any of the following: ? Bleeding from an injury or your nose that does not stop. ? Unusual colored urine (red or dark brown) or unusual colored stools (red or black). ? Unusual bruising for unknown reasons. ? A serious fall or if you hit your head (even if there is no bleeding).  Some medicines may interact with  Eliquis and might increase your risk of bleeding or clotting while on Eliquis. To help avoid this, consult your healthcare provider or pharmacist prior to using any new prescription or non-prescription medications, including herbals, vitamins, non-steroidal anti-inflammatory drugs (NSAIDs) and supplements.  This website has more information on Eliquis (apixaban): http://www.eliquis.com/eliquis/home

## 2019-04-28 NOTE — TOC Transition Note (Addendum)
Transition of Care [facility]) - CM/SW Discharge Note   Patient Details  Name: Nathan Grant MRN: [id number] Date of Birth: 09/15/1945  [facility] CM/SW Contact:  Atilano Median, LCSW Phone Number: 04/28/2019, 11:55 AM   Clinical Narrative:     Discharged home with home health services. Patient and wife aware and agreeable to this plan. DME will be delivered to patient's home due to limited stock.Oxygen concentrator to be delivered by 5 pm today. Family will provide transport. No other needs at this time. Case closed to this CSW.  Final next level of care: Home w Home Health Services Barriers to Discharge: Barriers Resolved   Patient Goals and CMS Choice   CMS Medicare.gov Compare Post Acute Care list provided to:: Patient Choice offered to / list presented to : Patient  Discharge Placement                       Discharge Plan and Services In-house Referral: Clinical Social Work              DME Arranged: Gilford Rile rolling DME Agency: [place] Date DME Agency Contacted: 04/28/19 Time DME Agency Contacted: [phone] Representative spoke with at DME Agency: Magda Paganini HH Arranged: PT, OT [facility] Agency: [place] ([place]) Date [place]: 04/27/19 Time [place]: 1603 Representative spoke with at [place]: [place] Interventions     Readmission Risk Interventions Readmission Risk Prevention Plan 04/27/2019 04/25/2019  Transportation Screening - Complete  HRI or Home Care Consult Complete -  Palliative Care Screening - Not Applicable  Medication Review (RN Care Manager) - Complete  Some recent data might be hidden

## 2019-04-28 NOTE — Care Management Important Message (Signed)
Important Message  Patient Details  Name: Nathan Grant MRN: [id number] Date of Birth: [birth year]   Medicare Important Message Given:  Yes - Important Message mailed due to current National Emergency  Verbal consent obtained due to current National Emergency  Relationship to patient: Spouse/Significant Other Contact Name: Art Buff Call Date: 04/28/19  Time: 1105 Phone: [phone] Outcome: Spoke with contact Important Message mailed to: Patient address on file    [place] 04/28/2019, 11:05 AM

## 2019-04-28 NOTE — Progress Notes (Signed)
6 minute ambulatory oxygen assessment: - Room Air while resting: 94% - Room Air while ambulating: 84% - Ambulating on 3L [place]: 92% - Room Air recovering: 92%  Nathan Grant

## 2019-04-28 NOTE — Discharge Summary (Addendum)
PATIENT DETAILS Name: Nathan Grant Age: 74 y.o. Sex: male Date of Birth: 02/23/1946 MRN: [id number]. Admitting Physician: Phillips Grout, MD [id number], Arlie Solomons, MD  Admit Date: 04/19/2019 Discharge date: 04/28/2019  Recommendations for Outpatient Follow-up:  1. Follow up with PCP in 1-2 weeks 2. Please obtain CMP/CBC in one week 3. Repeat Chest Xray in 4-6 week 4. Please ensure patient follows up with nephrology 5. Please note-patient now on Eliquis for submassive PE/DVT-May need hematology input in the outpatient setting-to determine appropriate duration of anticoagulation.  Admitted From:  Home  Disposition: [place]:  Yes  Equipment/Devices: None  Discharge Condition: Stable  CODE STATUS: FULL CODE  Diet recommendation:  Diet Order            Diet - low sodium heart healthy        Diet Heart Room service appropriate? Yes; Fluid consistency: Thin  Diet effective now               Brief Summary: Brief Narrative: Patient is a 74 y.o. male with PMHx of HTN, DM-2, CKD stage IIIb, history of PAF-presented to the ED with 3-day history of shortness of breath-found to have acute hypoxic respiratory failure secondary to COVID-19 pneumonia along with AKI.  Patient was subsequently admitted to the hospitalist service at [facility].  Significant events: 1/7>> Doppler lower extremity positive for bilateral DVT 1/7>> TTE with clot in pulmonary artery with moderate RV dysfunction and severe pulmonary hypertension 1/7>> transferred to ICU given TPA 1/8>> oxygenation improved-transfer back to PCU  Brief Hospital Course:  Acute Hypoxic Resp Failure due to pulmonary embolism with RV strain and Covid 19 with concurrent bacterial pneumonia: Remarkable improvement post TPA-remains on room air.    However per nursing staff-desaturates when he ambulates-hence have ordered home O2.  Completed remdesivir on 1/10 and Rocephin on 1/11.    Completed  steroids on 1/14.  COVID-19 Labs:  Recent Labs    04/26/19 0321 04/27/19 0142  DDIMER 17.39* 12.56*  FERRITIN 1,119* 1,102*  CRP 3.0* 2.7*    Lab Results  Component Value Date   SARSCOV2NAA Detected (A) 04/12/2019     COVID-19 Medications: Steroids: 1/6>> 1/14 Remdesivir:1/6>>1/10  Other medications: Antibiotics: Rocephin: 1/7>> 1/11 Zithromax: 1/7>>1/10  AKI on CKD stage IIIb: Suspect this is hemodynamically mediated in the setting of acute infection with COVID-19 along with the use of Aldactone and losartan.  UA without proteinuria, renal ultrasound negative for hydronephrosis.  Slowly improving-patient is making good urine.  Both BUN and creatinine is slowly downtrending-patient just getting supportive care in the hospital.  Suspect now stable for close monitoring to be done in the outpatient setting by PCP.  I have asked patient's wife to get him an appointment with his primary nephrologist.  Nephrology did consult during this hospital stay-but has signed off several days back.   Hyperkalemia: Resolved-continue Lokelma on discharge-although normalized-remains in the high normal range.  Pulmonary embolism with RV strain and bilateral lower extremity DVT: Due to severe hypoxemia (up to 15 L of oxygen)-underwent TPA infusion on 1/7-subsequently remained on IV heparin.    Remarkable improvement post TPA-was quickly titrated down to room air in a few days.  He was maintained on IV heparin due to worsening renal function-but his renal function improved-after discussion with family-he was transitioned to Eliquis.  Note echo on 1/7 demonstrated a pulmonary embolus (clot seen in PA) with severe pulmonary hypertension.    HTN: BP relatively stable-continue amlodipine.  Follow and optimize in the outpatient setting.[name]  DM-2 (A1c 6.4):  Continue close monitoring in the outpatient setting-managed with SSI.  Chronic diastolic heart failure: Remains compensated-has chronic-1+ pitting  edema in the lower extremities-continue to hold diuretics until seen by PCP and nephrology.   PAF: Rate controlled-now on anticoagulation.  Anemia: Has anemia of CKD at baseline-worsened due to acute illness-however hemoglobin stable over the past few days..  No evidence of GI bleeding.  Obesity: Estimated body mass index is 42.35 kg/m as calculated from the following:   Height as of this encounter: 6\' 1"  (1.854 m).   Weight as of this encounter: 145.6 kg.    Procedures: None  Discharge Diagnoses:  Principal Problem:   Pneumonia due to COVID-19 virus Active Problems:   Atrial flutter (HCC)   Acute on chronic renal failure (HCC)   HTN (hypertension)   Chronic diastolic CHF (congestive heart failure) ([place])   Acute respiratory failure due to COVID-19 [facility])   Discharge Instructions:    Person Under Monitoring Name: Nathan Grant  Location: [address] [place] [address]   Infection Prevention Recommendations for Individuals Confirmed to have, or Being Evaluated for, 2019 Novel Coronavirus (COVID-19) Infection Who Receive Care at Home  Individuals who are confirmed to have, or are being evaluated for, COVID-19 should follow the prevention steps below until a healthcare provider or local or state health department says they can return to normal activities.  Stay home except to get medical care You should restrict activities outside your home, except for getting medical care. Do not go to work, school, or public areas, and do not use public transportation or taxis.  Call ahead before visiting your doctor Before your medical appointment, call the healthcare provider and tell them that you have, or are being evaluated for, COVID-19 infection. This will help the healthcare provider's office take steps to keep other people from getting infected. Ask your healthcare provider to call the local or state health department.  Monitor your symptoms Seek prompt medical  attention if your illness is worsening (e.g., difficulty breathing). Before going to your medical appointment, call the healthcare provider and tell them that you have, or are being evaluated for, COVID-19 infection. Ask your healthcare provider to call the local or state health department.  Wear a facemask You should wear a facemask that covers your nose and mouth when you are in the same room with other people and when you visit a healthcare provider. People who live with or visit you should also wear a facemask while they are in the same room with you.  Separate yourself from other people in your home As much as possible, you should stay in a different room from other people in your home. Also, you should use a separate bathroom, if available.  Avoid sharing household items You should not share dishes, drinking glasses, cups, eating utensils, towels, bedding, or other items with other people in your home. After using these items, you should wash them thoroughly with soap and water.  Cover your coughs and sneezes Cover your mouth and nose with a tissue when you cough or sneeze, or you can cough or sneeze into your sleeve. Throw used tissues in a lined trash can, and immediately wash your hands with soap and water for at least 20 seconds or use an alcohol-based hand rub.  Wash your [facility] your hands often and thoroughly with soap and water for at least 20 seconds. You can use an alcohol-based hand  sanitizer if soap and water are not available and if your hands are not visibly dirty. Avoid touching your eyes, nose, and mouth with unwashed hands.   Prevention Steps for Caregivers and Household Members of Individuals Confirmed to have, or Being Evaluated for, COVID-19 Infection Being Cared for in the Home  If you live with, or provide care at home for, a person confirmed to have, or being evaluated for, COVID-19 infection please follow these guidelines to prevent  infection:  Follow healthcare provider's instructions Make sure that you understand and can help the patient follow any healthcare provider instructions for all care.  Provide for the patient's basic needs You should help the patient with basic needs in the home and provide support for getting groceries, prescriptions, and other personal needs.  Monitor the patient's symptoms If they are getting sicker, call his or her medical provider and tell them that the patient has, or is being evaluated for, COVID-19 infection. This will help the healthcare provider's office take steps to keep other people from getting infected. Ask the healthcare provider to call the local or state health department.  Limit the number of people who have contact with the patient  If possible, have only one caregiver for the patient.  Other household members should stay in another home or place of residence. If this is not possible, they should stay  in another room, or be separated from the patient as much as possible. Use a separate bathroom, if available.  Restrict visitors who do not have an essential need to be in the home.  Keep older adults, very young children, and other sick people away from the patient Keep older adults, very young children, and those who have compromised immune systems or chronic health conditions away from the patient. This includes people with chronic heart, lung, or kidney conditions, diabetes, and cancer.  Ensure good ventilation Make sure that shared spaces in the home have good air flow, such as from an air conditioner or an opened window, weather permitting.  Wash your hands often  Wash your hands often and thoroughly with soap and water for at least 20 seconds. You can use an alcohol based hand sanitizer if soap and water are not available and if your hands are not visibly dirty.  Avoid touching your eyes, nose, and mouth with unwashed hands.  Use disposable paper towels  to dry your hands. If not available, use dedicated cloth towels and replace them when they become wet.  Wear a facemask and gloves  Wear a disposable facemask at all times in the room and gloves when you touch or have contact with the patient's blood, body fluids, and/or secretions or excretions, such as sweat, saliva, sputum, nasal mucus, vomit, urine, or feces.  Ensure the mask fits over your nose and mouth tightly, and do not touch it during use.  Throw out disposable facemasks and gloves after using them. Do not reuse.  Wash your hands immediately after removing your facemask and gloves.  If your personal clothing becomes contaminated, carefully remove clothing and launder. Wash your hands after handling contaminated clothing.  Place all used disposable facemasks, gloves, and other waste in a lined container before disposing them with other household waste.  Remove gloves and wash your hands immediately after handling these items.  Do not share dishes, glasses, or other household items with the patient  Avoid sharing household items. You should not share dishes, drinking glasses, cups, eating utensils, towels, bedding, or other items with  a patient who is confirmed to have, or being evaluated for, COVID-19 infection.  After the person uses these items, you should wash them thoroughly with soap and water.  Wash laundry thoroughly  Immediately remove and wash clothes or bedding that have blood, body fluids, and/or secretions or excretions, such as sweat, saliva, sputum, nasal mucus, vomit, urine, or feces, on them.  Wear gloves when handling laundry from the patient.  Read and follow directions on labels of laundry or clothing items and detergent. In general, wash and dry with the warmest temperatures recommended on the label.  Clean all areas the individual has used often  Clean all touchable surfaces, such as counters, tabletops, doorknobs, bathroom fixtures, toilets, phones,  keyboards, tablets, and bedside tables, every day. Also, clean any surfaces that may have blood, body fluids, and/or secretions or excretions on them.  Wear gloves when cleaning surfaces the patient has come in contact with.  Use a diluted bleach solution (e.g., dilute bleach with 1 part bleach and 10 parts water) or a household disinfectant with a label that says EPA-registered for coronaviruses. To make a bleach solution at home, add 1 tablespoon of bleach to 1 quart (4 cups) of water. For a larger supply, add  cup of bleach to 1 gallon (16 cups) of water.  Read labels of cleaning products and follow recommendations provided on product labels. Labels contain instructions for safe and effective use of the cleaning product including precautions you should take when applying the product, such as wearing gloves or eye protection and making sure you have good ventilation during use of the product.  Remove gloves and wash hands immediately after cleaning.  Monitor yourself for signs and symptoms of illness Caregivers and household members are considered close contacts, should monitor their health, and will be asked to limit movement outside of the home to the extent possible. Follow the monitoring steps for close contacts listed on the symptom monitoring form.   ? If you have additional questions, contact your local health department or call the epidemiologist on call at [phone] (available 24/7). ? This guidance is subject to change. For the most up-to-date guidance from CDC, please refer to their website: YouBlogs.pl    Activity:  As tolerated with Full fall precautions use walker/cane & assistance as needed   Discharge Instructions    Call MD for:  difficulty breathing, headache or visual disturbances   Complete by: As directed    Call MD for:  extreme fatigue   Complete by: As directed    Call MD for:  persistant  dizziness or light-headedness   Complete by: As directed    Call MD for:  persistant nausea and vomiting   Complete by: As directed    Diet - low sodium heart healthy   Complete by: As directed    Discharge instructions   Complete by: As directed    1.)  3 weeks of isolation from 04/12/2019   Follow with Primary MD  Delia Chimes A, MD in 1-2 weeks  Please follow-up with your nephrologist in the next 7 to 10 days.  Please go through your discharge medications-we have stopped some of your blood pressure medications and your fluid pill.  Please get a complete blood count and chemistry panel checked by your Primary MD at your next visit, and again as instructed by your Primary MD.  Get Medicines reviewed and adjusted: Please take all your medications with you for your next visit with your Primary MD  Laboratory/radiological  data: Please request your Primary MD to go over all hospital tests and procedure/radiological results at the follow up, please ask your Primary MD to get all Hospital records sent to his/her office.  In some cases, they will be blood work, cultures and biopsy results pending at the time of your discharge. Please request that your primary care M.D. follows up on these results.  Also Note the following: If you experience worsening of your admission symptoms, develop shortness of breath, life threatening emergency, suicidal or homicidal thoughts you must seek medical attention immediately by calling 911 or calling your MD immediately  if symptoms less severe.  You must read complete instructions/literature along with all the possible adverse reactions/side effects for all the Medicines you take and that have been prescribed to you. Take any new Medicines after you have completely understood and accpet all the possible adverse reactions/side effects.   Do not drive when taking Pain medications or sleeping medications (Benzodaizepines)  Do not take more than prescribed  Pain, Sleep and Anxiety Medications. It is not advisable to combine anxiety,sleep and pain medications without talking with your primary care practitioner  Special Instructions: If you have smoked or chewed Tobacco  in the last 2 yrs please stop smoking, stop any regular Alcohol  and or any Recreational drug use.  Wear Seat belts while driving.  Please note: You were cared for by a hospitalist during your hospital stay. Once you are discharged, your primary care physician will handle any further medical issues. Please note that NO REFILLS for any discharge medications will be authorized once you are discharged, as it is imperative that you return to your primary care physician (or establish a relationship with a primary care physician if you do not have one) for your post hospital discharge needs so that they can reassess your need for medications and monitor your lab values.   Increase activity slowly   Complete by: As directed      Allergies as of 04/28/2019      Reactions   Ibuprofen    Tylenol [acetaminophen]       Medication List    STOP taking these medications   allopurinol 100 MG tablet Commonly known as: ZYLOPRIM   aspirin EC 81 MG tablet   colchicine 0.6 MG tablet   lisinopril 40 MG tablet Commonly known as: ZESTRIL   losartan 100 MG tablet Commonly known as: COZAAR   spironolactone 25 MG tablet Commonly known as: ALDACTONE   sucralfate 1 GM/10ML suspension Commonly known as: Carafate     TAKE these medications   albuterol 108 (90 Base) MCG/ACT inhaler Commonly known as: VENTOLIN HFA Inhale 2 puffs into the lungs every 6 (six) hours as needed for wheezing.   amLODipine 10 MG tablet Commonly known as: NORVASC Take 1 tablet by mouth once daily   apixaban 5 MG Tabs tablet Commonly known as: ELIQUIS Take 1 tablet (5 mg total) by mouth 2 (two) times daily.   b complex vitamins tablet Take 1 tablet by mouth daily.   CENTRUM SILVER PO Take 1 tablet by mouth  daily.   AIRBORNE PO Take 1 tablet by mouth daily.   cholecalciferol 25 MCG (1000 UNIT) tablet Commonly known as: VITAMIN D3 Take 1,000 Units by mouth daily.   fluticasone 50 MCG/ACT nasal spray Commonly known as: FLONASE Place 2 sprays into both nostrils daily.   guaiFENesin 600 MG 12 hr tablet Commonly known as: MUCINEX Take 600 mg by mouth 2 (two) times daily  as needed for cough.   metoprolol succinate 25 MG 24 hr tablet Commonly known as: TOPROL-XL Take 1 tablet (25 mg total) by mouth daily.   pravastatin 40 MG tablet Commonly known as: PRAVACHOL TAKE 1 TABLET BY MOUTH ONCE DAILY IN THE EVENING What changed: when to take this   sodium zirconium cyclosilicate 10 g Pack packet Commonly known as: LOKELMA Take 10 g by mouth daily for 15 days.   Vitamin C 500 MG Caps Take 500 mg by mouth daily.            Durable Medical Equipment  (From admission, onward)         Start     Ordered   04/28/19 1126  For home use only DME oxygen  Once    Question Answer Comment  Length of Need 6 Months   Mode or (Route) Nasal cannula   Liters per Minute 2   Frequency Continuous (stationary and portable oxygen unit needed)   Oxygen conserving device Yes   Oxygen delivery system Gas      04/28/19 1125   04/24/19 1602  For home use only DME Walker rolling  Once    Question Answer Comment  Walker: With [place]   Patient needs a walker to treat with the following condition Physical deconditioning      04/24/19 [place], [place] Follow up.   Why: Someone will call you to schedule your appointment Contact information: [place] [address] [phone]        Forrest Moron, MD. Schedule an appointment as soon as possible for a visit in 1 week(s).   Specialty: Internal Medicine Contact information: [place] [address] [phone]        Primary Nephrologist.  Schedule an appointment as soon as possible for a visit in 1 week(s).          Allergies  Allergen Reactions  . Ibuprofen   . Tylenol [Acetaminophen]     Consultations:   pulmonary/intensive care and nephrology    Other Procedures/Studies: US Renal  Result Date: 04/20/2019 CLINICAL DATA:  74 year old male with a history of acute kidney injury EXAM: RENAL / URINARY TRACT ULTRASOUND COMPLETE COMPARISON:  None. FINDINGS: Right Kidney: Length: 11.7 cm x 4.8 cm x 6.1 cm, 181 cc. Echogenicity of the right renal cortex relatively increased. No hydronephrosis. Flow confirmed in the hilum of the right kidney. No focal echogenic focus. Left Kidney: Length: 10.5 cm x 6.4 cm x 5.4 cm, 190 cc. Echogenicity of the left kidney relatively increased. No hydronephrosis. No focal echogenic focus. Flow confirmed in the hilum. Bladder: Bladder partially distended, otherwise unremarkable IMPRESSION: Negative for hydronephrosis. Mildly increased echogenicity of the bilateral kidneys, indicating medical renal disease. Electronically Signed   By: Corrie Mckusick D.O.   On: 04/20/2019 11:16   DG Chest Portable 1 View  Result Date: 04/19/2019 CLINICAL DATA:  Hypoxia. EXAM: PORTABLE CHEST 1 VIEW COMPARISON:  December 09, 2013 FINDINGS: Mild hazy infiltrates are seen within the bilateral lung bases. There is no evidence of a pleural effusion or pneumothorax. The cardiac silhouette is mildly enlarged. The visualized skeletal structures are unremarkable. IMPRESSION: Mild hazy bibasilar infiltrates. Electronically Signed   By: Virgina Norfolk M.D.   On: 04/19/2019 18:54   ECHOCARDIOGRAM COMPLETE  Result Date: 04/20/2019   ECHOCARDIOGRAM REPORT   Patient Name:   [facility]  [place] Date of Exam: 04/20/2019 Medical Rec #:  [id number]        Height:       73.0 in Accession #:    [id number]       Weight:       304.9 lb Date of Birth:  1945/08/17         BSA:          2.57 m Patient Age:    27 years         BP:           127/70 mmHg  Patient Gender: M                HR:           86 bpm. Exam Location:  Inpatient Procedure: 2D Echo, Cardiac Doppler and Color Doppler Indications:    R06.02 SOB  History:        Patient has prior history of Echocardiogram examinations, most                 recent 05/29/2012. Abnormal ECG, Pulmonary HTN, Arrythmias:Atrial                 Flutter and NSVT, Signs/Symptoms:Chest Pain and Dyspnea; Risk                 Factors:Sleep Apnea, Hypertension, Diabetes and Dyslipidemia.                 Covid 19 positive.  Sonographer:    Roseanna Rainbow RDCS Referring Phys: [place]  Sonographer Comments: Technically difficult study due to poor echo windows and patient is morbidly obese. Image acquisition challenging due to patient body habitus. IMPRESSIONS  1. Normal LVEF with severe pulmonary hypertension and likely PA thrombus in right PA, suggestive of pulmonary embolism. RV is only midly enlarged. Findings communicated with Dr. Sloan Leiter.  2. Left ventricular ejection fraction, by visual estimation, is 65 to 70%. The left ventricle has hyperdynamic function. There is severely increased left ventricular hypertrophy.  3. Right ventricular pressure overload.  4. The left ventricle has no regional wall motion abnormalities.  5. Global right ventricle has moderately reduced systolic function.The right ventricular size is mildly enlarged. Right vetricular wall thickness was not assessed.  6. Preserved RV apical function, consistent with McConnell's sign. RV size normal to only slightly enlarged depending on imaging window.  7. Left atrial size was mildly dilated.  8. Right atrial size was moderately dilated.  9. Small pericardial effusion. 10. The pericardial effusion is circumferential. 11. The mitral valve is normal in structure. Trivial mitral valve regurgitation. 12. The tricuspid valve is normal in structure. 13. The aortic valve is tricuspid. Aortic valve regurgitation is trivial. Mild aortic valve sclerosis without  stenosis. 14. Pulmonic regurgitation is moderate. 15. The pulmonic valve was grossly normal. Pulmonic valve regurgitation is moderate. 16. Severely elevated pulmonary artery systolic pressure. 23. Suspect thromboembolism in the main pulmonary artery. 18. There is a mobile mass seen in the right PA highly suspicious for thrombus. Additional thrombus burden not excluded. 19. The tricuspid regurgitant velocity is 3.74 m/s, and with an assumed right atrial pressure of 15 mmHg, the estimated right ventricular systolic pressure is severely elevated at 70.8 mmHg. 20. The inferior vena cava is dilated in size with <50% respiratory variability, suggesting right atrial pressure of 15 mmHg. FINDINGS  Left Ventricle: Left ventricular ejection fraction, by visual estimation, is 65 to 70%. The left ventricle has hyperdynamic function. The left  ventricle has no regional wall motion abnormalities. There is severely increased left ventricular hypertrophy.  Concentric left ventricular hypertrophy. The interventricular septum is flattened in systole, consistent with right ventricular pressure overload. Right Ventricle: The right ventricular size is mildly enlarged. Right vetricular wall thickness was not assessed. Global RV systolic function is has moderately reduced systolic function. The tricuspid regurgitant velocity is 3.74 m/s, and with an assumed  right atrial pressure of 15 mmHg, the estimated right ventricular systolic pressure is severely elevated at 70.8 mmHg. Preserved RV apical function, consistent with McConnell's sign. RV size normal to only slightly enlarged depending on imaging window. Left Atrium: Left atrial size was mildly dilated. Right Atrium: Right atrial size was moderately dilated Pericardium: A small pericardial effusion is present. The pericardial effusion is circumferential. Mitral Valve: The mitral valve is normal in structure. Trivial mitral valve regurgitation. Tricuspid Valve: The tricuspid valve is  normal in structure. Tricuspid valve regurgitation moderate. Aortic Valve: The aortic valve is tricuspid. . There is mild thickening and mild calcification of the aortic valve. Aortic valve regurgitation is trivial. Mild aortic valve sclerosis is present, with no evidence of aortic valve stenosis. There is mild thickening of the aortic valve. There is mild calcification of the aortic valve. Pulmonic Valve: The pulmonic valve was grossly normal. Pulmonic valve regurgitation is moderate. Pulmonic regurgitation is moderate. Aorta: The aortic root, ascending aorta and aortic arch are all structurally normal, with no evidence of dilitation or obstruction. Pulmonary Artery: Suspect thromboembolism in the main pulmonary artery. There is a mobile mass seen in the right PA highly suspicious for thrombus. Additional thrombus burden not excluded. Venous: The inferior vena cava is dilated in size with less than 50% respiratory variability, suggesting right atrial pressure of 15 mmHg. IAS/Shunts: No atrial level shunt detected by color flow Doppler.  LEFT VENTRICLE PLAX 2D LVIDd:         4.29 cm LVIDs:         2.34 cm LV PW:         2.08 cm LV IVS:        2.18 cm LVOT diam:     2.00 cm LV SV:         64 ml LV SV Index:   23.37 LVOT Area:     3.14 cm  LV Volumes (MOD) LV area d, A2C:    28.70 cm LV area d, A4C:    22.30 cm LV area s, A2C:    15.20 cm LV area s, A4C:    12.20 cm LV major d, A2C:   6.53 cm LV major d, A4C:   7.35 cm LV major s, A2C:   5.76 cm LV major s, A4C:   5.75 cm LV vol d, MOD A2C: 104.0 ml LV vol d, MOD A4C: 59.9 ml LV vol s, MOD A2C: 34.7 ml LV vol s, MOD A4C: 23.8 ml LV SV MOD A2C:     69.3 ml LV SV MOD A4C:     59.9 ml LV SV MOD BP:      54.1 ml RIGHT VENTRICLE         IVC TAPSE (M-mode): 1.2 cm  IVC diam: 2.23 cm LEFT ATRIUM             Index       RIGHT ATRIUM           Index LA diam:        4.40 cm 1.71 cm/m  RA Area:  29.40 cm LA Vol (A2C):   93.0 ml 36.13 ml/m RA Volume:   108.00 ml 41.96  ml/m LA Vol (A4C):   77.6 ml 30.15 ml/m LA Biplane Vol: 94.3 ml 36.64 ml/m  AORTIC VALVE             PULMONIC VALVE LVOT Vmax:   149.00 cm/s PR End Diast Vel: 2.12 msec LVOT Vmean:  95.400 cm/s LVOT VTI:    0.197 m  AORTA Ao Asc diam: 3.20 cm MITRAL VALVE                        TRICUSPID VALVE MV Area (PHT): 4.78 cm             TR Peak grad:   55.8 mmHg MV PHT:        46.01 msec           TR Vmax:        387.00 cm/s MV Decel Time: 159 msec MV E velocity: 103.53 cm/s 103 cm/s SHUNTS                                     Systemic VTI:  0.20 m                                     Systemic Diam: 2.00 cm  Buford Dresser MD Electronically signed by Buford Dresser MD Signature Date/Time: 04/20/2019/6:30:29 PM    Final    VAS [place] LOWER EXTREMITY VENOUS (DVT)  Result Date: 04/20/2019  Lower Venous Study Indications: Covid-19, elevated D-Dimer.  Limitations: Body habitus. Comparison Study: No prior study on file Performing Technologist: Sharion Dove RVS  Examination Guidelines: A complete evaluation includes B-mode imaging, spectral Doppler, color Doppler, and power Doppler as needed of all accessible portions of each vessel. Bilateral testing is considered an integral part of a complete examination. Limited examinations for reoccurring indications may be performed as noted.  +---------+---------------+---------+-----------+----------+--------------+ RIGHT    CompressibilityPhasicitySpontaneityPropertiesThrombus Aging +---------+---------------+---------+-----------+----------+--------------+ CFV      Full           Yes      Yes                                 +---------+---------------+---------+-----------+----------+--------------+ SFJ      Full                                                        +---------+---------------+---------+-----------+----------+--------------+ FV Prox  Full                                                         +---------+---------------+---------+-----------+----------+--------------+ FV Mid   Full                                                        +---------+---------------+---------+-----------+----------+--------------+  FV DistalFull                                                        +---------+---------------+---------+-----------+----------+--------------+ PFV      Full                                                        +---------+---------------+---------+-----------+----------+--------------+ POP      Partial                                      Acute          +---------+---------------+---------+-----------+----------+--------------+ PTV      None                                         Acute          +---------+---------------+---------+-----------+----------+--------------+ PERO                                                  Not visualized +---------+---------------+---------+-----------+----------+--------------+ Gastroc  None                                         Acute          +---------+---------------+---------+-----------+----------+--------------+   +---------+---------------+---------+-----------+----------+--------------+ LEFT     CompressibilityPhasicitySpontaneityPropertiesThrombus Aging +---------+---------------+---------+-----------+----------+--------------+ CFV      Full           Yes      Yes                                 +---------+---------------+---------+-----------+----------+--------------+ SFJ      Full                                                        +---------+---------------+---------+-----------+----------+--------------+ FV Prox  Full                                                        +---------+---------------+---------+-----------+----------+--------------+ FV Mid   Full                                                         +---------+---------------+---------+-----------+----------+--------------+ FV DistalFull                                                        +---------+---------------+---------+-----------+----------+--------------+  PFV      Full                                                        +---------+---------------+---------+-----------+----------+--------------+ POP      None                                         Acute          +---------+---------------+---------+-----------+----------+--------------+ PTV      None                                         Acute          +---------+---------------+---------+-----------+----------+--------------+ PERO                                                  Not visualized +---------+---------------+---------+-----------+----------+--------------+ Gastroc  None                                         Acute          +---------+---------------+---------+-----------+----------+--------------+     Summary: Right: Findings consistent with acute deep vein thrombosis involving the right popliteal vein, right posterior tibial veins, and right gastrocnemius veins. Left: Findings consistent with acute deep vein thrombosis involving the left popliteal vein, left posterior tibial veins, and left gastrocnemius veins.  *See table(s) above for measurements and observations. Electronically signed by Monica Martinez MD on 04/20/2019 at 5:10:07 PM.    Final      TODAY-DAY OF DISCHARGE:  Subjective:   Nathan Grant today has no headache,no chest abdominal pain,no new weakness tingling or numbness, feels much better wants to go home today.   Objective:   Blood pressure (!) 156/90, pulse 75, temperature 98.3 F (36.8 C), temperature source Oral, resp. rate 18, height 6\' 1"  (1.854 m), weight (!) 145.6 kg, SpO2 91 %.  Intake/Output Summary (Last 24 hours) at 04/28/2019 1126 Last data filed at 04/27/2019 1818 Gross per 24 hour  Intake  240 ml  Output 300 ml  Net -60 ml   Filed Weights   04/19/19 2326 04/25/19 0200  Weight: (!) 138.3 kg (!) 145.6 kg    Exam: Awake Alert, Oriented *3, No new F.N deficits, Normal affect [place].AT,PERRAL Supple Neck,No JVD, No cervical lymphadenopathy appriciated.  Symmetrical Chest wall movement, Good air movement bilaterally, CTAB RRR,No Gallops,Rubs or new Murmurs, No Parasternal Heave +ve B.Sounds, Abd Soft, Non tender, No organomegaly appriciated, No rebound -guarding or rigidity. No Cyanosis, Clubbing or edema, No new Rash or bruise   PERTINENT RADIOLOGIC STUDIES: US Renal  Result Date: 04/20/2019 CLINICAL DATA:  74 year old male with a history of acute kidney injury EXAM: RENAL / URINARY TRACT ULTRASOUND COMPLETE COMPARISON:  None. FINDINGS: Right Kidney: Length: 11.7 cm x 4.8 cm x 6.1 cm, 181 cc. Echogenicity of the right renal cortex relatively increased. No hydronephrosis. Flow confirmed in the hilum  of the right kidney. No focal echogenic focus. Left Kidney: Length: 10.5 cm x 6.4 cm x 5.4 cm, 190 cc. Echogenicity of the left kidney relatively increased. No hydronephrosis. No focal echogenic focus. Flow confirmed in the hilum. Bladder: Bladder partially distended, otherwise unremarkable IMPRESSION: Negative for hydronephrosis. Mildly increased echogenicity of the bilateral kidneys, indicating medical renal disease. Electronically Signed   By: Corrie Mckusick D.O.   On: 04/20/2019 11:16   DG Chest Portable 1 View  Result Date: 04/19/2019 CLINICAL DATA:  Hypoxia. EXAM: PORTABLE CHEST 1 VIEW COMPARISON:  December 09, 2013 FINDINGS: Mild hazy infiltrates are seen within the bilateral lung bases. There is no evidence of a pleural effusion or pneumothorax. The cardiac silhouette is mildly enlarged. The visualized skeletal structures are unremarkable. IMPRESSION: Mild hazy bibasilar infiltrates. Electronically Signed   By: Virgina Norfolk M.D.   On: 04/19/2019 18:54   ECHOCARDIOGRAM  COMPLETE  Result Date: 04/20/2019   ECHOCARDIOGRAM REPORT   Patient Name:   Nathan Grant Date of Exam: 04/20/2019 Medical Rec #:  [id number]        Height:       73.0 in Accession #:    [id number]       Weight:       304.9 lb Date of Birth:  [birth year]         BSA:          2.57 m Patient Age:    68 years         BP:           127/70 mmHg Patient Gender: M                HR:           86 bpm. Exam Location:  Inpatient Procedure: 2D Echo, Cardiac Doppler and Color Doppler Indications:    R06.02 SOB  History:        Patient has prior history of Echocardiogram examinations, most                 recent 05/29/2012. Abnormal ECG, Pulmonary HTN, Arrythmias:Atrial                 Flutter and NSVT, Signs/Symptoms:Chest Pain and Dyspnea; Risk                 Factors:Sleep Apnea, Hypertension, Diabetes and Dyslipidemia.                 Covid 19 positive.  Sonographer:    Roseanna Rainbow RDCS Referring Phys: [place]  Sonographer Comments: Technically difficult study due to poor echo windows and patient is morbidly obese. Image acquisition challenging due to patient body habitus. IMPRESSIONS  1. Normal LVEF with severe pulmonary hypertension and likely PA thrombus in right PA, suggestive of pulmonary embolism. RV is only midly enlarged. Findings communicated with Dr. Sloan Leiter.  2. Left ventricular ejection fraction, by visual estimation, is 65 to 70%. The left ventricle has hyperdynamic function. There is severely increased left ventricular hypertrophy.  3. Right ventricular pressure overload.  4. The left ventricle has no regional wall motion abnormalities.  5. Global right ventricle has moderately reduced systolic function.The right ventricular size is mildly enlarged. Right vetricular wall thickness was not assessed.  6. Preserved RV apical function, consistent with McConnell's sign. RV size normal to only slightly enlarged depending on imaging window.  7. Left atrial size was mildly dilated.  8. Right atrial size was  moderately dilated.  9. Small  pericardial effusion. 10. The pericardial effusion is circumferential. 11. The mitral valve is normal in structure. Trivial mitral valve regurgitation. 12. The tricuspid valve is normal in structure. 13. The aortic valve is tricuspid. Aortic valve regurgitation is trivial. Mild aortic valve sclerosis without stenosis. 14. Pulmonic regurgitation is moderate. 15. The pulmonic valve was grossly normal. Pulmonic valve regurgitation is moderate. 16. Severely elevated pulmonary artery systolic pressure. 30. Suspect thromboembolism in the main pulmonary artery. 18. There is a mobile mass seen in the right PA highly suspicious for thrombus. Additional thrombus burden not excluded. 19. The tricuspid regurgitant velocity is 3.74 m/s, and with an assumed right atrial pressure of 15 mmHg, the estimated right ventricular systolic pressure is severely elevated at 70.8 mmHg. 20. The inferior vena cava is dilated in size with <50% respiratory variability, suggesting right atrial pressure of 15 mmHg. FINDINGS  Left Ventricle: Left ventricular ejection fraction, by visual estimation, is 65 to 70%. The left ventricle has hyperdynamic function. The left ventricle has no regional wall motion abnormalities. There is severely increased left ventricular hypertrophy.  Concentric left ventricular hypertrophy. The interventricular septum is flattened in systole, consistent with right ventricular pressure overload. Right Ventricle: The right ventricular size is mildly enlarged. Right vetricular wall thickness was not assessed. Global RV systolic function is has moderately reduced systolic function. The tricuspid regurgitant velocity is 3.74 m/s, and with an assumed  right atrial pressure of 15 mmHg, the estimated right ventricular systolic pressure is severely elevated at 70.8 mmHg. Preserved RV apical function, consistent with McConnell's sign. RV size normal to only slightly enlarged depending on imaging window.  Left Atrium: Left atrial size was mildly dilated. Right Atrium: Right atrial size was moderately dilated Pericardium: A small pericardial effusion is present. The pericardial effusion is circumferential. Mitral Valve: The mitral valve is normal in structure. Trivial mitral valve regurgitation. Tricuspid Valve: The tricuspid valve is normal in structure. Tricuspid valve regurgitation moderate. Aortic Valve: The aortic valve is tricuspid. . There is mild thickening and mild calcification of the aortic valve. Aortic valve regurgitation is trivial. Mild aortic valve sclerosis is present, with no evidence of aortic valve stenosis. There is mild thickening of the aortic valve. There is mild calcification of the aortic valve. Pulmonic Valve: The pulmonic valve was grossly normal. Pulmonic valve regurgitation is moderate. Pulmonic regurgitation is moderate. Aorta: The aortic root, ascending aorta and aortic arch are all structurally normal, with no evidence of dilitation or obstruction. Pulmonary Artery: Suspect thromboembolism in the main pulmonary artery. There is a mobile mass seen in the right PA highly suspicious for thrombus. Additional thrombus burden not excluded. Venous: The inferior vena cava is dilated in size with less than 50% respiratory variability, suggesting right atrial pressure of 15 mmHg. IAS/Shunts: No atrial level shunt detected by color flow Doppler.  LEFT VENTRICLE PLAX 2D LVIDd:         4.29 cm LVIDs:         2.34 cm LV PW:         2.08 cm LV IVS:        2.18 cm LVOT diam:     2.00 cm LV SV:         64 ml LV SV Index:   23.37 LVOT Area:     3.14 cm  LV Volumes (MOD) LV area d, A2C:    28.70 cm LV area d, A4C:    22.30 cm LV area s, A2C:    15.20 cm LV area s,  A4C:    12.20 cm LV major d, A2C:   6.53 cm LV major d, A4C:   7.35 cm LV major s, A2C:   5.76 cm LV major s, A4C:   5.75 cm LV vol d, MOD A2C: 104.0 ml LV vol d, MOD A4C: 59.9 ml LV vol s, MOD A2C: 34.7 ml LV vol s, MOD A4C: 23.8 ml LV SV  MOD A2C:     69.3 ml LV SV MOD A4C:     59.9 ml LV SV MOD BP:      54.1 ml RIGHT VENTRICLE         IVC TAPSE (M-mode): 1.2 cm  IVC diam: 2.23 cm LEFT ATRIUM             Index       RIGHT ATRIUM           Index LA diam:        4.40 cm 1.71 cm/m  RA Area:     29.40 cm LA Vol (A2C):   93.0 ml 36.13 ml/m RA Volume:   108.00 ml 41.96 ml/m LA Vol (A4C):   77.6 ml 30.15 ml/m LA Biplane Vol: 94.3 ml 36.64 ml/m  AORTIC VALVE             PULMONIC VALVE LVOT Vmax:   149.00 cm/s PR End Diast Vel: 2.12 msec LVOT Vmean:  95.400 cm/s LVOT VTI:    0.197 m  AORTA Ao Asc diam: 3.20 cm MITRAL VALVE                        TRICUSPID VALVE MV Area (PHT): 4.78 cm             TR Peak grad:   55.8 mmHg MV PHT:        46.01 msec           TR Vmax:        387.00 cm/s MV Decel Time: 159 msec MV E velocity: 103.53 cm/s 103 cm/s SHUNTS                                     Systemic VTI:  0.20 m                                     Systemic Diam: 2.00 cm  Buford Dresser MD Electronically signed by Buford Dresser MD Signature Date/Time: 04/20/2019/6:30:29 PM    Final    VAS [place] LOWER EXTREMITY VENOUS (DVT)  Result Date: 04/20/2019  Lower Venous Study Indications: Covid-19, elevated D-Dimer.  Limitations: Body habitus. Comparison Study: No prior study on file Performing Technologist: Sharion Dove RVS  Examination Guidelines: A complete evaluation includes B-mode imaging, spectral Doppler, color Doppler, and power Doppler as needed of all accessible portions of each vessel. Bilateral testing is considered an integral part of a complete examination. Limited examinations for reoccurring indications may be performed as noted.  +---------+---------------+---------+-----------+----------+--------------+ RIGHT    CompressibilityPhasicitySpontaneityPropertiesThrombus Aging +---------+---------------+---------+-----------+----------+--------------+ CFV      Full           Yes      Yes                                  +---------+---------------+---------+-----------+----------+--------------+  SFJ      Full                                                        +---------+---------------+---------+-----------+----------+--------------+ FV Prox  Full                                                        +---------+---------------+---------+-----------+----------+--------------+ FV Mid   Full                                                        +---------+---------------+---------+-----------+----------+--------------+ FV DistalFull                                                        +---------+---------------+---------+-----------+----------+--------------+ PFV      Full                                                        +---------+---------------+---------+-----------+----------+--------------+ POP      Partial                                      Acute          +---------+---------------+---------+-----------+----------+--------------+ PTV      None                                         Acute          +---------+---------------+---------+-----------+----------+--------------+ PERO                                                  Not visualized +---------+---------------+---------+-----------+----------+--------------+ Gastroc  None                                         Acute          +---------+---------------+---------+-----------+----------+--------------+   +---------+---------------+---------+-----------+----------+--------------+ LEFT     CompressibilityPhasicitySpontaneityPropertiesThrombus Aging +---------+---------------+---------+-----------+----------+--------------+ CFV      Full           Yes      Yes                                 +---------+---------------+---------+-----------+----------+--------------+ SFJ      Full                                                         +---------+---------------+---------+-----------+----------+--------------+  FV Prox  Full                                                        +---------+---------------+---------+-----------+----------+--------------+ FV Mid   Full                                                        +---------+---------------+---------+-----------+----------+--------------+ FV DistalFull                                                        +---------+---------------+---------+-----------+----------+--------------+ PFV      Full                                                        +---------+---------------+---------+-----------+----------+--------------+ POP      None                                         Acute          +---------+---------------+---------+-----------+----------+--------------+ PTV      None                                         Acute          +---------+---------------+---------+-----------+----------+--------------+ PERO                                                  Not visualized +---------+---------------+---------+-----------+----------+--------------+ Gastroc  None                                         Acute          +---------+---------------+---------+-----------+----------+--------------+     Summary: Right: Findings consistent with acute deep vein thrombosis involving the right popliteal vein, right posterior tibial veins, and right gastrocnemius veins. Left: Findings consistent with acute deep vein thrombosis involving the left popliteal vein, left posterior tibial veins, and left gastrocnemius veins.  *See table(s) above for measurements and observations. Electronically signed by Monica Martinez MD on 04/20/2019 at 5:10:07 PM.    Final      PERTINENT LAB RESULTS: CBC: Recent Labs    04/26/19 0321 04/27/19 0142  WBC 16.0* 14.1*  HGB 9.1* 8.9*  HCT 28.1* 27.9*  PLT 357 333   CMET CMP     Component Value Date/Time     NA 136 04/28/2019 0416   NA 141 03/07/2019 1201   K 5.1 04/28/2019 0416  CL 105 04/28/2019 0416   CO2 21 (L) 04/28/2019 0416   GLUCOSE 107 (H) 04/28/2019 0416   BUN 98 (H) 04/28/2019 0416   BUN 33 (H) 03/07/2019 1201   CREATININE 2.82 (H) 04/28/2019 0416   CALCIUM 8.4 (L) 04/28/2019 0416   PROT 6.0 (L) 04/28/2019 0416   PROT 6.7 03/07/2019 1201   ALBUMIN 2.8 (L) 04/28/2019 0416   ALBUMIN 4.4 03/07/2019 1201   AST 21 04/28/2019 0416   ALT 25 04/28/2019 0416   ALKPHOS 39 04/28/2019 0416   BILITOT 0.7 04/28/2019 0416   BILITOT 0.4 03/07/2019 1201   GFRNONAA 21 (L) 04/28/2019 0416   GFRAA 24 (L) 04/28/2019 0416    GFR Estimated Creatinine Clearance: 34.5 mL/min (A) (by C-G formula based on SCr of 2.82 mg/dL (H)). No results for input(s): LIPASE, AMYLASE in the last 72 hours. No results for input(s): CKTOTAL, CKMB, CKMBINDEX, TROPONINI in the last 72 hours. Invalid input(s): POCBNP Recent Labs    04/26/19 0321 04/27/19 0142  DDIMER 17.39* 12.56*   No results for input(s): HGBA1C in the last 72 hours. No results for input(s): CHOL, HDL, LDLCALC, TRIG, CHOLHDL, LDLDIRECT in the last 72 hours. No results for input(s): TSH, T4TOTAL, T3FREE, THYROIDAB in the last 72 hours.  Invalid input(s): FREET3 Recent Labs    04/26/19 0321 04/27/19 0142  FERRITIN 1,119* 1,102*   Coags: No results for input(s): INR in the last 72 hours.  Invalid input(s): PT Microbiology: Recent Results (from the past 240 hour(s))  Blood culture (routine x 2)     Status: None   Collection Time: 04/19/19  6:20 PM   Specimen: BLOOD  Result Value Ref Range Status   Specimen Description   Final    BLOOD RIGHT ANTECUBITAL Performed at [facility] [address], [place], [place] [address]    Special Requests   Final    BOTTLES DRAWN AEROBIC ONLY Blood Culture adequate volume   Culture   Final    NO GROWTH 5 DAYS Performed at [facility], [place] [address],  [place], [place] [address]    Report Status 04/24/2019 FINAL  Final  Blood culture (routine x 2)     Status: None   Collection Time: 04/19/19  6:35 PM   Specimen: BLOOD  Result Value Ref Range Status   Specimen Description   Final    BLOOD LEFT ANTECUBITAL Performed at [facility] [address], [place], [place] [address]    Special Requests   Final    BOTTLES DRAWN AEROBIC ONLY Blood Culture adequate volume   Culture   Final    NO GROWTH 5 DAYS Performed at [facility], [place] [address] [place], [place] [address]    Report Status 04/24/2019 FINAL  Final  MRSA PCR Screening     Status: None   Collection Time: 04/21/19  4:24 AM   Specimen: Nasal Mucosa; Nasopharyngeal  Result Value Ref Range Status   MRSA by PCR NEGATIVE NEGATIVE Final    Comment:        The GeneXpert MRSA Assay (FDA approved for NASAL specimens only), is one component of a comprehensive MRSA colonization surveillance program. It is not intended to diagnose MRSA infection nor to guide or monitor treatment for MRSA infections. Performed at [facility], [place] [address] [place], [place] [address]     FURTHER DISCHARGE INSTRUCTIONS:  Get Medicines reviewed and adjusted: Please take all your medications with you for your  next visit with your Primary MD  Laboratory/radiological data: Please request your Primary MD to go over all hospital tests and procedure/radiological results at the follow up, please ask your Primary MD to get all Hospital records sent to his/her office.  In some cases, they will be blood work, cultures and biopsy results pending at the time of your discharge. Please request that your primary care M.D. goes through all the records of your hospital data and follows up on these results.  Also Note the following: If you experience worsening of your admission symptoms, develop shortness of breath, life threatening emergency, suicidal or homicidal  thoughts you must seek medical attention immediately by calling 911 or calling your MD immediately  if symptoms less severe.  You must read complete instructions/literature along with all the possible adverse reactions/side effects for all the Medicines you take and that have been prescribed to you. Take any new Medicines after you have completely understood and accpet all the possible adverse reactions/side effects.   Do not drive when taking Pain medications or sleeping medications (Benzodaizepines)  Do not take more than prescribed Pain, Sleep and Anxiety Medications. It is not advisable to combine anxiety,sleep and pain medications without talking with your primary care practitioner  Special Instructions: If you have smoked or chewed Tobacco  in the last 2 yrs please stop smoking, stop any regular Alcohol  and or any Recreational drug use.  Wear Seat belts while driving.  Please note: You were cared for by a hospitalist during your hospital stay. Once you are discharged, your primary care physician will handle any further medical issues. Please note that NO REFILLS for any discharge medications will be authorized once you are discharged, as it is imperative that you return to your primary care physician (or establish a relationship with a primary care physician if you do not have one) for your post hospital discharge needs so that they can reassess your need for medications and monitor your lab values.  Total Time spent coordinating discharge including counseling, education and face to face time equals 35 minutes.  SignedOren Binet 04/28/2019 11:26 AM

## 2019-04-30 DIAGNOSIS — E1122 Type 2 diabetes mellitus with diabetic chronic kidney disease: Secondary | ICD-10-CM | POA: Diagnosis not present

## 2019-04-30 DIAGNOSIS — N1832 Chronic kidney disease, stage 3b: Secondary | ICD-10-CM | POA: Diagnosis not present

## 2019-04-30 DIAGNOSIS — I130 Hypertensive heart and chronic kidney disease with heart failure and stage 1 through stage 4 chronic kidney disease, or unspecified chronic kidney disease: Secondary | ICD-10-CM | POA: Diagnosis not present

## 2019-04-30 DIAGNOSIS — I5032 Chronic diastolic (congestive) heart failure: Secondary | ICD-10-CM | POA: Diagnosis not present

## 2019-04-30 DIAGNOSIS — I2699 Other pulmonary embolism without acute cor pulmonale: Secondary | ICD-10-CM | POA: Diagnosis not present

## 2019-04-30 DIAGNOSIS — I82403 Acute embolism and thrombosis of unspecified deep veins of lower extremity, bilateral: Secondary | ICD-10-CM | POA: Diagnosis not present

## 2019-04-30 DIAGNOSIS — I480 Paroxysmal atrial fibrillation: Secondary | ICD-10-CM | POA: Diagnosis not present

## 2019-04-30 DIAGNOSIS — U071 COVID-19: Secondary | ICD-10-CM | POA: Diagnosis not present

## 2019-04-30 DIAGNOSIS — J1282 Pneumonia due to coronavirus disease 2019: Secondary | ICD-10-CM | POA: Diagnosis not present

## 2019-04-30 DIAGNOSIS — D631 Anemia in chronic kidney disease: Secondary | ICD-10-CM | POA: Diagnosis not present

## 2019-05-01 ENCOUNTER — Telehealth: Payer: Self-pay | Admitting: Family Medicine

## 2019-05-01 NOTE — Telephone Encounter (Signed)
Physical therapist called to ask if they can continue PT with him 3x a week for one week and then Two times a week for three weeks  [phone]  Please advise

## 2019-05-02 NOTE — Telephone Encounter (Signed)
Please give verbal order.

## 2019-05-02 NOTE — Telephone Encounter (Signed)
Spoke with Cecilie Lowers, the pt;s PT and gave him the verbal orders from [place].

## 2019-05-03 ENCOUNTER — Telehealth: Payer: Self-pay | Admitting: Family Medicine

## 2019-05-03 DIAGNOSIS — I5032 Chronic diastolic (congestive) heart failure: Secondary | ICD-10-CM | POA: Diagnosis not present

## 2019-05-03 DIAGNOSIS — N1832 Chronic kidney disease, stage 3b: Secondary | ICD-10-CM | POA: Diagnosis not present

## 2019-05-03 DIAGNOSIS — I130 Hypertensive heart and chronic kidney disease with heart failure and stage 1 through stage 4 chronic kidney disease, or unspecified chronic kidney disease: Secondary | ICD-10-CM | POA: Diagnosis not present

## 2019-05-03 DIAGNOSIS — J1282 Pneumonia due to coronavirus disease 2019: Secondary | ICD-10-CM | POA: Diagnosis not present

## 2019-05-03 DIAGNOSIS — E1122 Type 2 diabetes mellitus with diabetic chronic kidney disease: Secondary | ICD-10-CM | POA: Diagnosis not present

## 2019-05-03 DIAGNOSIS — I2699 Other pulmonary embolism without acute cor pulmonale: Secondary | ICD-10-CM | POA: Diagnosis not present

## 2019-05-03 DIAGNOSIS — I82403 Acute embolism and thrombosis of unspecified deep veins of lower extremity, bilateral: Secondary | ICD-10-CM | POA: Diagnosis not present

## 2019-05-03 DIAGNOSIS — U071 COVID-19: Secondary | ICD-10-CM | POA: Diagnosis not present

## 2019-05-03 DIAGNOSIS — D631 Anemia in chronic kidney disease: Secondary | ICD-10-CM | POA: Diagnosis not present

## 2019-05-03 DIAGNOSIS — I480 Paroxysmal atrial fibrillation: Secondary | ICD-10-CM | POA: Diagnosis not present

## 2019-05-03 NOTE — Telephone Encounter (Signed)
Please advise 

## 2019-05-03 NOTE — Telephone Encounter (Signed)
Nathan Grant from [facility] would like Dr. Nolon Rod to know pt had been discharged from the hospital with covid and pna. He is on 2 liters of oxygen. Request pt to work on supplemental oxygen needs and allow activity without oxygen as long as it stays 90% or greater. Please advise Claiborne Billings at [phone].

## 2019-05-05 ENCOUNTER — Ambulatory Visit (INDEPENDENT_AMBULATORY_CARE_PROVIDER_SITE_OTHER): Payer: Medicare HMO | Admitting: Family Medicine

## 2019-05-05 ENCOUNTER — Other Ambulatory Visit: Payer: Self-pay

## 2019-05-05 ENCOUNTER — Encounter: Payer: Self-pay | Admitting: Family Medicine

## 2019-05-05 VITALS — BP 147/81 | HR 114 | Temp 98.3°F | Resp 17 | Ht 73.0 in | Wt 312.6 lb

## 2019-05-05 DIAGNOSIS — G4733 Obstructive sleep apnea (adult) (pediatric): Secondary | ICD-10-CM

## 2019-05-05 DIAGNOSIS — J1282 Pneumonia due to coronavirus disease 2019: Secondary | ICD-10-CM | POA: Diagnosis not present

## 2019-05-05 DIAGNOSIS — I82403 Acute embolism and thrombosis of unspecified deep veins of lower extremity, bilateral: Secondary | ICD-10-CM | POA: Diagnosis not present

## 2019-05-05 DIAGNOSIS — R609 Edema, unspecified: Secondary | ICD-10-CM | POA: Diagnosis not present

## 2019-05-05 DIAGNOSIS — N184 Chronic kidney disease, stage 4 (severe): Secondary | ICD-10-CM | POA: Diagnosis not present

## 2019-05-05 DIAGNOSIS — I2699 Other pulmonary embolism without acute cor pulmonale: Secondary | ICD-10-CM | POA: Diagnosis not present

## 2019-05-05 DIAGNOSIS — J9601 Acute respiratory failure with hypoxia: Secondary | ICD-10-CM

## 2019-05-05 DIAGNOSIS — J9600 Acute respiratory failure, unspecified whether with hypoxia or hypercapnia: Secondary | ICD-10-CM | POA: Diagnosis not present

## 2019-05-05 DIAGNOSIS — D631 Anemia in chronic kidney disease: Secondary | ICD-10-CM | POA: Diagnosis not present

## 2019-05-05 DIAGNOSIS — I130 Hypertensive heart and chronic kidney disease with heart failure and stage 1 through stage 4 chronic kidney disease, or unspecified chronic kidney disease: Secondary | ICD-10-CM | POA: Diagnosis not present

## 2019-05-05 DIAGNOSIS — I5032 Chronic diastolic (congestive) heart failure: Secondary | ICD-10-CM | POA: Diagnosis not present

## 2019-05-05 DIAGNOSIS — N179 Acute kidney failure, unspecified: Secondary | ICD-10-CM | POA: Diagnosis not present

## 2019-05-05 DIAGNOSIS — Z23 Encounter for immunization: Secondary | ICD-10-CM | POA: Diagnosis not present

## 2019-05-05 DIAGNOSIS — B948 Sequelae of other specified infectious and parasitic diseases: Secondary | ICD-10-CM

## 2019-05-05 DIAGNOSIS — I480 Paroxysmal atrial fibrillation: Secondary | ICD-10-CM | POA: Diagnosis not present

## 2019-05-05 DIAGNOSIS — N1832 Chronic kidney disease, stage 3b: Secondary | ICD-10-CM | POA: Diagnosis not present

## 2019-05-05 DIAGNOSIS — U071 COVID-19: Secondary | ICD-10-CM

## 2019-05-05 DIAGNOSIS — E1122 Type 2 diabetes mellitus with diabetic chronic kidney disease: Secondary | ICD-10-CM | POA: Diagnosis not present

## 2019-05-05 NOTE — Progress Notes (Signed)
Established Patient Office Visit  Subjective:  Patient ID: Nathan Grant, male    DOB: May 17, 1945  Age: 74 y.o. MRN: [id number]  CC:  Chief Complaint  Patient presents with  . f/u med check/ covid    HPI Nathan Grant presents for Hospital Follow up for a complicated hospitalization due to COVID-19 infection which lead to Acute on Chronic Renal Failure  He was also noted to have DVT/PE He was discharged home on Eliquis.  He has sleep apnea but does not want to use the CPAP. He has persistent shortness of breath and fatigue He denies any hemoptysis   Past Medical History:  Diagnosis Date  . Atrial flutter ([place])    s/p cardioversion 06/2010  . Bradycardia    a. Accelerated junctional & sinus bradycardia during 01/2013 adm.  . Chronic headaches   . Degenerative joint disease     End-stage degenerative joint disease, left knee  . Erectile dysfunction   . HTN (hypertension)   . Hypertensive heart disease    a. Admit for CP 01/2013 felt due to this. Cath with normal coronary anatomy.  . Microcytic anemia  03/22/2001  . Morbid obesity ([place])   . NSVT (nonsustained ventricular tachycardia) ([place])    a. During 01/2013 adm.  . Obesity   . Osteoarthritis   . Tendonitis   . Tobacco chew use     History of chewing tobacco usage.   . Urinary tract infection     Pseudomonas urinary tract infection    Past Surgical History:  Procedure Laterality Date  . CARDIOVERSION  07/01/2010  . LEFT HEART CATHETERIZATION WITH CORONARY ANGIOGRAM N/A 01/16/2013   Procedure: LEFT HEART CATHETERIZATION WITH CORONARY ANGIOGRAM;  Surgeon: Peter M Martinique, MD;  Location: [facility] CATH LAB;  Service: Cardiovascular;  Laterality: N/A;  . TOTAL KNEE ARTHROPLASTY  May 2009   Bilateral  . VASECTOMY      Family History  Problem Relation Age of Onset  . Alcohol abuse Brother   . Hypertension Brother   . Hypertension Mother     Social History   Socioeconomic History  . Marital status: Married   Spouse name: Not on file  . Number of children: 6  . Years of education: Not on file  . Highest education level: Not on file  Occupational History  . Occupation: Day Care    Employer: RETIRED  Tobacco Use  . Smoking status: Never Smoker  . Smokeless tobacco: Current User    Types: Chew  Substance and Sexual Activity  . Alcohol use: No  . Drug use: No  . Sexual activity: Yes  Other Topics Concern  . Not on file  Social History Narrative  . Not on file   Social Determinants of Health   Financial Resource Strain:   . Difficulty of Paying Living Expenses: Not on file  Food Insecurity:   . Worried About Charity fundraiser in the Last Year: Not on file  . Ran Out of Food in the Last Year: Not on file  Transportation Needs:   . Lack of Transportation (Medical): Not on file  . Lack of Transportation (Non-Medical): Not on file  Physical Activity:   . Days of Exercise per Week: Not on file  . Minutes of Exercise per Session: Not on file  Stress:   . Feeling of Stress : Not on file  Social Connections:   . Frequency of Communication with Friends and Family: Not on file  . Frequency of Social Gatherings with  Friends and Family: Not on file  . Attends Religious Services: Not on file  . Active Member of Clubs or Organizations: Not on file  . Attends Archivist Meetings: Not on file  . Marital Status: Not on file  Intimate Partner Violence:   . Fear of Current or Ex-Partner: Not on file  . Emotionally Abused: Not on file  . Physically Abused: Not on file  . Sexually Abused: Not on file    Outpatient Medications Prior to Visit  Medication Sig Dispense Refill  . albuterol (VENTOLIN HFA) 108 (90 Base) MCG/ACT inhaler Inhale 2 puffs into the lungs every 6 (six) hours as needed for wheezing. 18 g 0  . amLODipine (NORVASC) 10 MG tablet Take 1 tablet by mouth once daily (Patient taking differently: Take 10 mg by mouth daily. ) 30 tablet 0  . apixaban (ELIQUIS) 5 MG TABS  tablet Take 1 tablet (5 mg total) by mouth 2 (two) times daily. 120 tablet 0  . Ascorbic Acid (VITAMIN C) 500 MG CAPS Take 500 mg by mouth daily.     [name] b complex vitamins tablet Take 1 tablet by mouth daily.    . cholecalciferol (VITAMIN D3) 25 MCG (1000 UT) tablet Take 1,000 Units by mouth daily.    . fluticasone (FLONASE) 50 MCG/ACT nasal spray Place 2 sprays into both nostrils daily. 16 g 6  . guaiFENesin (MUCINEX) 600 MG 12 hr tablet Take 600 mg by mouth 2 (two) times daily as needed for cough.    . metoprolol succinate (TOPROL-XL) 25 MG 24 hr tablet Take 1 tablet (25 mg total) by mouth daily. 90 tablet 1  . Multiple Vitamins-Minerals (AIRBORNE PO) Take 1 tablet by mouth daily.    . Multiple Vitamins-Minerals (CENTRUM SILVER PO) Take 1 tablet by mouth daily.    . sodium zirconium cyclosilicate (LOKELMA) 10 g PACK packet Take 10 g by mouth daily for 15 days. 15 packet 0  . pravastatin (PRAVACHOL) 40 MG tablet TAKE 1 TABLET BY MOUTH ONCE DAILY IN THE EVENING (Patient taking differently: Take 40 mg by mouth daily. ) 90 tablet 0   No facility-administered medications prior to visit.    Allergies  Allergen Reactions  . Ibuprofen   . Tylenol [Acetaminophen]     ROS Review of Systems Review of Systems  Constitutional: Negative for activity change, appetite change, chills and fever.  HENT: Negative for congestion, nosebleeds, trouble swallowing and voice change.   Respiratory: Negative for cough, shortness of breath and wheezing.   Gastrointestinal: Negative for diarrhea, nausea and vomiting.  Genitourinary: Negative for difficulty urinating, dysuria, flank pain and hematuria.  Musculoskeletal: Negative for back pain, joint swelling and neck pain.  Neurological: Negative for dizziness, speech difficulty, light-headedness and numbness.  See HPI. All other review of systems negative.     Objective:    Physical Exam  BP (!) 147/81 (BP Location: Right Arm, Patient Position: Sitting,  Cuff Size: Large)   Pulse (!) 114   Temp 98.3 F (36.8 C) (Oral)   Resp 17   Ht 6' 1"  (1.854 m)   Wt (!) 312 lb 9.6 oz (141.8 kg)   SpO2 96%   BMI 41.24 kg/m  Wt Readings from Last 3 Encounters:  06/06/19 (!) 316 lb 3.2 oz (143.4 kg)  05/05/19 (!) 312 lb 9.6 oz (141.8 kg)  04/25/19 (!) 320 lb 15.8 oz (145.6 kg)   Physical Exam  Constitutional: Oriented to person, place, and time. Appears well-developed and well-nourished.  HENT:  Head: Normocephalic and atraumatic.  Eyes: Conjunctivae and EOM are normal.  Cardiovascular: Normal rate, regular rhythm, normal heart sounds and intact distal pulses.  No murmur heard. Pulmonary/Chest: Effort normal and breath sounds normal. No stridor. No respiratory distress. Has no wheezes.  Neurological: Is alert and oriented to person, place, and time.  Skin: Skin is warm. Capillary refill takes less than 2 seconds.  Psychiatric: Has a normal mood and affect. Behavior is normal. Judgment and thought content normal.    Health Maintenance Due  Topic Date Due  . OPHTHALMOLOGY EXAM  06/19/1954  . TETANUS/TDAP  06/19/1963  . PNA vac Low Risk Adult (1 of 2 - PCV13) 06/18/2009  . INFLUENZA VACCINE  11/12/2018    There are no preventive care reminders to display for this patient.  No results found for: TSH Lab Results  Component Value Date   WBC 4.5 (A) 06/06/2019   HGB 9.7 (A) 06/06/2019   HCT 30.6 06/06/2019   MCV 72.3 (A) 06/06/2019   PLT 206 05/05/2019   Lab Results  Component Value Date   NA 139 06/06/2019   K 4.8 06/06/2019   CO2 24 06/06/2019   GLUCOSE 109 (H) 06/06/2019   BUN 28 (H) 06/06/2019   CREATININE 2.45 (H) 06/06/2019   BILITOT 0.3 06/06/2019   ALKPHOS 57 06/06/2019   AST 16 06/06/2019   ALT 10 06/06/2019   PROT 6.7 06/06/2019   ALBUMIN 3.9 06/06/2019   CALCIUM 9.4 06/06/2019   ANIONGAP 10 04/28/2019   GFR 66.34 07/07/2013   Lab Results  Component Value Date   CHOL 198 06/06/2019   Lab Results  Component  Value Date   HDL 47 06/06/2019   Lab Results  Component Value Date   LDLCALC 129 (H) 06/06/2019   Lab Results  Component Value Date   TRIG 124 06/06/2019   Lab Results  Component Value Date   CHOLHDL 4.2 06/06/2019   Lab Results  Component Value Date   HGBA1C 6.0 (A) 06/06/2019      Assessment & Plan:   Problem List Items Addressed This Visit      Respiratory   OSA (obstructive sleep apnea)   Relevant Orders   Ambulatory referral to Pulmonology   Acute respiratory failure due to COVID-19 [facility])   Relevant Orders   Ambulatory referral to Pulmonology     Genitourinary   Acute on chronic renal failure ([place]) - Primary   Relevant Orders   CBC with Differential/Platelet (Completed)   CMP14+EGFR (Completed)    Other Visit Diagnoses    Need for prophylactic vaccination and inoculation against influenza       Acute respiratory failure with hypoxia ([place])       Relevant Orders   Ambulatory referral to Pulmonology   Pitting edema          -   Discussed at length patient's hospital course.  Advised pt to remain compliant with meds, discussed risk and benefits Reviewed referrals placed Discussed his concerns Advised pt to follow up with appointments Questions solicited and answered.  A total of 30 minutes were spent face-to-face with the patient during this encounter and over half of that time was spent on counseling and coordination of care.   No orders of the defined types were placed in this encounter.   Follow-up: No follow-ups on file.    Forrest Moron, MD

## 2019-05-05 NOTE — Patient Instructions (Signed)
° ° ° °  If you have lab work done today you will be contacted with your lab results within the next 2 weeks.  If you have not heard from us then please contact us. The fastest way to get your results is to register for My Chart. ° ° °IF you received an x-ray today, you will receive an invoice from [facility]. Please contact [facility] at [phone] with questions or concerns regarding your invoice.  ° °IF you received labwork today, you will receive an invoice from [facility]. Please contact [facility] at [phone] with questions or concerns regarding your invoice.  ° °Our billing staff will not be able to assist you with questions regarding bills from these companies. ° °You will be contacted with the lab results as soon as they are available. The fastest way to get your results is to activate your My Chart account. Instructions are located on the last page of this paperwork. If you have not heard from us regarding the results in 2 weeks, please contact this office. °  ° ° ° °

## 2019-05-06 LAB — CBC WITH DIFFERENTIAL/PLATELET
Basophils Absolute: 0 10*3/uL (ref 0.0–0.2)
Basos: 1 %
EOS (ABSOLUTE): 0.1 10*3/uL (ref 0.0–0.4)
Eos: 2 %
Hematocrit: 28.3 % — ABNORMAL LOW (ref 37.5–51.0)
Hemoglobin: 8.6 g/dL — ABNORMAL LOW (ref 13.0–17.7)
Immature Grans (Abs): 0 10*3/uL (ref 0.0–0.1)
Immature Granulocytes: 0 %
Lymphocytes Absolute: 0.9 10*3/uL (ref 0.7–3.1)
Lymphs: 18 %
MCH: 22.5 pg — ABNORMAL LOW (ref 26.6–33.0)
MCHC: 30.4 g/dL — ABNORMAL LOW (ref 31.5–35.7)
MCV: 74 fL — ABNORMAL LOW (ref 79–97)
Monocytes Absolute: 0.4 10*3/uL (ref 0.1–0.9)
Monocytes: 7 %
Neutrophils Absolute: 3.6 10*3/uL (ref 1.4–7.0)
Neutrophils: 72 %
Platelets: 206 10*3/uL (ref 150–450)
RBC: 3.82 x10E6/uL — ABNORMAL LOW (ref 4.14–5.80)
RDW: 18.1 % — ABNORMAL HIGH (ref 11.6–15.4)
WBC: 5 10*3/uL (ref 3.4–10.8)

## 2019-05-06 LAB — CMP14+EGFR
ALT: 17 IU/L (ref 0–44)
AST: 18 IU/L (ref 0–40)
Albumin/Globulin Ratio: 1.2 (ref 1.2–2.2)
Albumin: 3.3 g/dL — ABNORMAL LOW (ref 3.7–4.7)
Alkaline Phosphatase: 57 IU/L (ref 39–117)
BUN/Creatinine Ratio: 11 (ref 10–24)
BUN: 27 mg/dL (ref 8–27)
Bilirubin Total: 0.5 mg/dL (ref 0.0–1.2)
CO2: 22 mmol/L (ref 20–29)
Calcium: 8.5 mg/dL — ABNORMAL LOW (ref 8.6–10.2)
Chloride: 106 mmol/L (ref 96–106)
Creatinine, Ser: 2.57 mg/dL — ABNORMAL HIGH (ref 0.76–1.27)
GFR calc Af Amer: 27 mL/min/{1.73_m2} — ABNORMAL LOW (ref >59)
GFR calc non Af Amer: 24 mL/min/{1.73_m2} — ABNORMAL LOW (ref >59)
Globulin, Total: 2.7 g/dL (ref 1.5–4.5)
Glucose: 135 mg/dL — ABNORMAL HIGH (ref 65–99)
Potassium: 4.3 mmol/L (ref 3.5–5.2)
Sodium: 141 mmol/L (ref 134–144)
Total Protein: 6 g/dL (ref 6.0–8.5)

## 2019-05-08 DIAGNOSIS — D649 Anemia, unspecified: Secondary | ICD-10-CM | POA: Diagnosis not present

## 2019-05-08 DIAGNOSIS — I480 Paroxysmal atrial fibrillation: Secondary | ICD-10-CM | POA: Diagnosis not present

## 2019-05-08 DIAGNOSIS — E875 Hyperkalemia: Secondary | ICD-10-CM | POA: Diagnosis not present

## 2019-05-08 DIAGNOSIS — I129 Hypertensive chronic kidney disease with stage 1 through stage 4 chronic kidney disease, or unspecified chronic kidney disease: Secondary | ICD-10-CM | POA: Diagnosis not present

## 2019-05-08 DIAGNOSIS — N1832 Chronic kidney disease, stage 3b: Secondary | ICD-10-CM | POA: Diagnosis not present

## 2019-05-08 DIAGNOSIS — R609 Edema, unspecified: Secondary | ICD-10-CM | POA: Diagnosis not present

## 2019-05-08 DIAGNOSIS — G4733 Obstructive sleep apnea (adult) (pediatric): Secondary | ICD-10-CM | POA: Diagnosis not present

## 2019-05-09 ENCOUNTER — Other Ambulatory Visit: Payer: Self-pay | Admitting: Family Medicine

## 2019-05-09 DIAGNOSIS — I10 Essential (primary) hypertension: Secondary | ICD-10-CM

## 2019-05-10 DIAGNOSIS — J1282 Pneumonia due to coronavirus disease 2019: Secondary | ICD-10-CM | POA: Diagnosis not present

## 2019-05-10 DIAGNOSIS — I82403 Acute embolism and thrombosis of unspecified deep veins of lower extremity, bilateral: Secondary | ICD-10-CM | POA: Diagnosis not present

## 2019-05-10 DIAGNOSIS — I5032 Chronic diastolic (congestive) heart failure: Secondary | ICD-10-CM | POA: Diagnosis not present

## 2019-05-10 DIAGNOSIS — I480 Paroxysmal atrial fibrillation: Secondary | ICD-10-CM | POA: Diagnosis not present

## 2019-05-10 DIAGNOSIS — U071 COVID-19: Secondary | ICD-10-CM | POA: Diagnosis not present

## 2019-05-10 DIAGNOSIS — I2699 Other pulmonary embolism without acute cor pulmonale: Secondary | ICD-10-CM | POA: Diagnosis not present

## 2019-05-10 DIAGNOSIS — D631 Anemia in chronic kidney disease: Secondary | ICD-10-CM | POA: Diagnosis not present

## 2019-05-10 DIAGNOSIS — I130 Hypertensive heart and chronic kidney disease with heart failure and stage 1 through stage 4 chronic kidney disease, or unspecified chronic kidney disease: Secondary | ICD-10-CM | POA: Diagnosis not present

## 2019-05-10 DIAGNOSIS — N1832 Chronic kidney disease, stage 3b: Secondary | ICD-10-CM | POA: Diagnosis not present

## 2019-05-10 DIAGNOSIS — E1122 Type 2 diabetes mellitus with diabetic chronic kidney disease: Secondary | ICD-10-CM | POA: Diagnosis not present

## 2019-05-12 DIAGNOSIS — U071 COVID-19: Secondary | ICD-10-CM | POA: Diagnosis not present

## 2019-05-12 DIAGNOSIS — J1282 Pneumonia due to coronavirus disease 2019: Secondary | ICD-10-CM | POA: Diagnosis not present

## 2019-05-12 DIAGNOSIS — I130 Hypertensive heart and chronic kidney disease with heart failure and stage 1 through stage 4 chronic kidney disease, or unspecified chronic kidney disease: Secondary | ICD-10-CM | POA: Diagnosis not present

## 2019-05-12 DIAGNOSIS — I480 Paroxysmal atrial fibrillation: Secondary | ICD-10-CM | POA: Diagnosis not present

## 2019-05-12 DIAGNOSIS — N1832 Chronic kidney disease, stage 3b: Secondary | ICD-10-CM | POA: Diagnosis not present

## 2019-05-12 DIAGNOSIS — D631 Anemia in chronic kidney disease: Secondary | ICD-10-CM | POA: Diagnosis not present

## 2019-05-12 DIAGNOSIS — I5032 Chronic diastolic (congestive) heart failure: Secondary | ICD-10-CM | POA: Diagnosis not present

## 2019-05-12 DIAGNOSIS — I82403 Acute embolism and thrombosis of unspecified deep veins of lower extremity, bilateral: Secondary | ICD-10-CM | POA: Diagnosis not present

## 2019-05-12 DIAGNOSIS — I2699 Other pulmonary embolism without acute cor pulmonale: Secondary | ICD-10-CM | POA: Diagnosis not present

## 2019-05-12 DIAGNOSIS — E1122 Type 2 diabetes mellitus with diabetic chronic kidney disease: Secondary | ICD-10-CM | POA: Diagnosis not present

## 2019-05-16 ENCOUNTER — Telehealth: Payer: Self-pay | Admitting: Family Medicine

## 2019-05-16 NOTE — Telephone Encounter (Signed)
Copied from [place] [id number]. Topic: General - Other >> May 16, 2019 11:01 AM Yvette Rack wrote: Reason for CRM: Pt stated he has an appt for the Covid vaccination and he has some questions regarding the blood thinner medication and whether he should continue taking the medication. Pt requests call back. Cb# [phone]

## 2019-05-16 NOTE — Telephone Encounter (Signed)
Spoke with patient to let him know I asked a provider and they said yes he can continue to take his medication and get the vaccine.

## 2019-05-18 DIAGNOSIS — J1282 Pneumonia due to coronavirus disease 2019: Secondary | ICD-10-CM | POA: Diagnosis not present

## 2019-05-18 DIAGNOSIS — I130 Hypertensive heart and chronic kidney disease with heart failure and stage 1 through stage 4 chronic kidney disease, or unspecified chronic kidney disease: Secondary | ICD-10-CM | POA: Diagnosis not present

## 2019-05-18 DIAGNOSIS — N1832 Chronic kidney disease, stage 3b: Secondary | ICD-10-CM | POA: Diagnosis not present

## 2019-05-18 DIAGNOSIS — E1122 Type 2 diabetes mellitus with diabetic chronic kidney disease: Secondary | ICD-10-CM | POA: Diagnosis not present

## 2019-05-18 DIAGNOSIS — I2699 Other pulmonary embolism without acute cor pulmonale: Secondary | ICD-10-CM | POA: Diagnosis not present

## 2019-05-18 DIAGNOSIS — U071 COVID-19: Secondary | ICD-10-CM | POA: Diagnosis not present

## 2019-05-18 DIAGNOSIS — D631 Anemia in chronic kidney disease: Secondary | ICD-10-CM | POA: Diagnosis not present

## 2019-05-18 DIAGNOSIS — I480 Paroxysmal atrial fibrillation: Secondary | ICD-10-CM | POA: Diagnosis not present

## 2019-05-18 DIAGNOSIS — I5032 Chronic diastolic (congestive) heart failure: Secondary | ICD-10-CM | POA: Diagnosis not present

## 2019-05-18 DIAGNOSIS — I82403 Acute embolism and thrombosis of unspecified deep veins of lower extremity, bilateral: Secondary | ICD-10-CM | POA: Diagnosis not present

## 2019-05-22 DIAGNOSIS — I129 Hypertensive chronic kidney disease with stage 1 through stage 4 chronic kidney disease, or unspecified chronic kidney disease: Secondary | ICD-10-CM | POA: Diagnosis not present

## 2019-05-22 DIAGNOSIS — I480 Paroxysmal atrial fibrillation: Secondary | ICD-10-CM | POA: Diagnosis not present

## 2019-05-22 DIAGNOSIS — N1832 Chronic kidney disease, stage 3b: Secondary | ICD-10-CM | POA: Diagnosis not present

## 2019-05-22 DIAGNOSIS — D649 Anemia, unspecified: Secondary | ICD-10-CM | POA: Diagnosis not present

## 2019-05-22 DIAGNOSIS — U071 COVID-19: Secondary | ICD-10-CM | POA: Diagnosis not present

## 2019-05-22 DIAGNOSIS — R609 Edema, unspecified: Secondary | ICD-10-CM | POA: Diagnosis not present

## 2019-05-22 DIAGNOSIS — E875 Hyperkalemia: Secondary | ICD-10-CM | POA: Diagnosis not present

## 2019-05-23 ENCOUNTER — Other Ambulatory Visit: Payer: Self-pay | Admitting: Family Medicine

## 2019-05-23 DIAGNOSIS — Z13220 Encounter for screening for lipoid disorders: Secondary | ICD-10-CM

## 2019-05-23 NOTE — Telephone Encounter (Signed)
Requested medication (s) are due for refill today: yes  Requested medication (s) are on the active medication list: yes  Last refill:  01/27/2019  Future visit scheduled: yes  Notes to clinic:   cardiovascular: antilipid - statins failed  Requested Prescriptions  Pending Prescriptions Disp Refills   pravastatin (PRAVACHOL) 40 MG tablet [Pharmacy Med Name: PRAVASTATIN NA 40 MG TAB 40 Tablet] 30 tablet 1    Sig: TAKE 1 TABLET BY MOUTH EVERY EVENING      Cardiovascular:  Antilipid - Statins Failed - 05/23/2019  4:53 PM      Failed - LDL in normal range and within 360 days    LDL Calculated  Date Value Ref Range Status  10/28/2018 111 (H) 0 - 99 mg/dL Final          Passed - Total Cholesterol in normal range and within 360 days    Cholesterol, Total  Date Value Ref Range Status  10/28/2018 192 100 - 199 mg/dL Final          Passed - HDL in normal range and within 360 days    HDL  Date Value Ref Range Status  10/28/2018 56 >39 mg/dL Final          Passed - Triglycerides in normal range and within 360 days    Triglycerides  Date Value Ref Range Status  10/28/2018 127 0 - 149 mg/dL Final          Passed - Patient is not pregnant      Passed - Valid encounter within last 12 months    Recent Outpatient Visits           2 weeks ago Acute renal failure superimposed on stage 4 chronic kidney disease, unspecified acute renal failure type ([place])   Primary Care at [facility], Zoe A, MD   1 month ago COVID-19 virus infection   Primary Care at [facility], Lilia Argue, MD   2 months ago Type 2 diabetes mellitus with stage 3a chronic kidney disease, with long-term current use of insulin ([place])   Primary Care at [facility], [place], MD   3 months ago Chronic diastolic CHF (congestive heart failure) ([place])   Primary Care at [facility], Arlie Solomons, MD   4 months ago Wheezing   Primary Care at [facility], Lorelee Market, NP       Future Appointments             In 1  week Forrest Moron, MD Primary Care at [place], [facility]   In 2 weeks Forrest Moron, MD Primary Care at [place], [facility]

## 2019-05-25 DIAGNOSIS — I2699 Other pulmonary embolism without acute cor pulmonale: Secondary | ICD-10-CM | POA: Diagnosis not present

## 2019-05-25 DIAGNOSIS — I82403 Acute embolism and thrombosis of unspecified deep veins of lower extremity, bilateral: Secondary | ICD-10-CM | POA: Diagnosis not present

## 2019-05-25 DIAGNOSIS — D631 Anemia in chronic kidney disease: Secondary | ICD-10-CM | POA: Diagnosis not present

## 2019-05-25 DIAGNOSIS — J1282 Pneumonia due to coronavirus disease 2019: Secondary | ICD-10-CM | POA: Diagnosis not present

## 2019-05-25 DIAGNOSIS — I5032 Chronic diastolic (congestive) heart failure: Secondary | ICD-10-CM | POA: Diagnosis not present

## 2019-05-25 DIAGNOSIS — U071 COVID-19: Secondary | ICD-10-CM | POA: Diagnosis not present

## 2019-05-25 DIAGNOSIS — I130 Hypertensive heart and chronic kidney disease with heart failure and stage 1 through stage 4 chronic kidney disease, or unspecified chronic kidney disease: Secondary | ICD-10-CM | POA: Diagnosis not present

## 2019-05-25 DIAGNOSIS — E1122 Type 2 diabetes mellitus with diabetic chronic kidney disease: Secondary | ICD-10-CM | POA: Diagnosis not present

## 2019-05-25 DIAGNOSIS — N1832 Chronic kidney disease, stage 3b: Secondary | ICD-10-CM | POA: Diagnosis not present

## 2019-05-25 DIAGNOSIS — I480 Paroxysmal atrial fibrillation: Secondary | ICD-10-CM | POA: Diagnosis not present

## 2019-05-29 ENCOUNTER — Telehealth: Payer: Self-pay | Admitting: Family Medicine

## 2019-05-29 DIAGNOSIS — U071 COVID-19: Secondary | ICD-10-CM | POA: Diagnosis not present

## 2019-05-29 DIAGNOSIS — J1289 Other viral pneumonia: Secondary | ICD-10-CM | POA: Diagnosis not present

## 2019-05-29 NOTE — Telephone Encounter (Signed)
Copied from [place] [phone]. Topic: General - Call Back - No Documentation >> May 29, 2019  3:26 PM Erick Blinks wrote: Reason for CRM: [phone] Call back request

## 2019-05-30 ENCOUNTER — Telehealth: Payer: Self-pay | Admitting: Family Medicine

## 2019-05-30 NOTE — Telephone Encounter (Signed)
Spoke with pt and advised him that he will to contact the company that brought them out.

## 2019-05-30 NOTE — Telephone Encounter (Signed)
Called patient @ 11:49 am no answer or voicemail to leave a message.

## 2019-05-30 NOTE — Telephone Encounter (Signed)
Not sure who would be responsible for this? Please advise  Copied from [place] [id number]. Topic: General - Other >> May 30, 2019  2:28 PM Celene Kras wrote: Reason for CRM: Pt called stating that he had oxygen tanks at his house and that he is needing someone to come and pick up tanks. Please advise.

## 2019-06-02 ENCOUNTER — Telehealth (INDEPENDENT_AMBULATORY_CARE_PROVIDER_SITE_OTHER): Payer: Medicare HMO | Admitting: Family Medicine

## 2019-06-02 ENCOUNTER — Other Ambulatory Visit: Payer: Self-pay

## 2019-06-02 DIAGNOSIS — D6869 Other thrombophilia: Secondary | ICD-10-CM | POA: Diagnosis not present

## 2019-06-02 DIAGNOSIS — G4733 Obstructive sleep apnea (adult) (pediatric): Secondary | ICD-10-CM

## 2019-06-02 DIAGNOSIS — Z760 Encounter for issue of repeat prescription: Secondary | ICD-10-CM | POA: Diagnosis not present

## 2019-06-02 DIAGNOSIS — N1832 Chronic kidney disease, stage 3b: Secondary | ICD-10-CM | POA: Diagnosis not present

## 2019-06-02 DIAGNOSIS — I5032 Chronic diastolic (congestive) heart failure: Secondary | ICD-10-CM | POA: Diagnosis not present

## 2019-06-02 DIAGNOSIS — M1A39X0 Chronic gout due to renal impairment, multiple sites, without tophus (tophi): Secondary | ICD-10-CM | POA: Diagnosis not present

## 2019-06-02 MED ORDER — PRAVASTATIN SODIUM 40 MG PO TABS: 40.0000 mg | ORAL_TABLET | Freq: Every day | ORAL | 3 refills | Status: DC

## 2019-06-02 NOTE — Progress Notes (Signed)
Telemedicine Encounter- SOAP NOTE Established Patient  This telephone encounter was conducted with the patient's (or proxy's) verbal consent via audio telecommunications: yes/no: Yes Patient was instructed to have this encounter in a suitably private space; and to only have persons present to whom they give permission to participate. In addition, patient identity was confirmed by use of name plus two identifiers (DOB and address).  I discussed the limitations, risks, security and privacy concerns of performing an evaluation and management service by telephone and the availability of in person appointments. I also discussed with the patient that there may be a patient responsible charge related to this service. The patient expressed understanding and agreed to proceed.  I spent a total of TIME; 0 MIN TO 60 MIN: 25 minutes talking with the patient or their proxy.  Chief Complaint  Patient presents with  . Follow-up    medical conditions    Subjective   Nathan Grant is a 73 y.o. established patient. Telephone visit today for  HPI  CHF and PE He reports that he is no longer using home oxygen He completed the physical therapy He still has OSA but does not use CPAP He is taking his meds and eating better food He denies chest pains, palpitations, shortness of breath  CKD He also has less swelling in the leg He is on Furosemide  He is not taking potassium He is taking vitamin D, vitamin C and iron   On blood thinner He is on Eliquis but states that his copay was still high He did not know it was so strong He denies bleeding from nose, gums, into urine or stool He was wondering how long he needs this medication since he has to pay $47 per month  Gout He states that he is no longer on allopurinol  He has no gout flares because he sticks to the diet for gout and heart failure He denies toe/foot pain or swelling  Patient Active Problem List   Diagnosis Date Noted  .  Pneumonia due to COVID-19 virus 04/19/2019  . Acute respiratory failure due to COVID-19 ([place]) 04/19/2019  . Wheezing 01/05/2019  . Post-nasal drip 01/05/2019  . Type 2 diabetes mellitus with stage 3 chronic kidney disease, with long-term current use of insulin ([place]) 04/28/2018  . Uricacidemia 04/28/2018  . Foot callus 04/28/2018  . Onychodystrophy 04/28/2018  . PND (paroxysmal nocturnal dyspnea) 03/08/2018  . Enuresis 03/08/2018  . Chronic gout due to renal impairment of multiple sites without tophus 03/08/2018  . OSA (obstructive sleep apnea) 03/08/2018  . CKD stage 3 due to type 2 diabetes mellitus ([place]) 03/08/2018  . Uncontrolled hypertension 03/08/2018  . Chronic diastolic CHF (congestive heart failure) ([place]) 09/06/2014  . CKD (chronic kidney disease) stage 3, GFR 30-59 ml/min 09/06/2014  . Hypercholesterolemia 12/21/2013  . HTN (hypertension) 01/17/2013  . NSVT (nonsustained ventricular tachycardia) ([place]) 01/17/2013  . Accelerated junctional rhythm 01/17/2013  . Sinus bradycardia 01/17/2013  . Diastolic dysfunction 78/67/5449  . Acute on chronic renal failure ([place]) 05/30/2012  . Osteoarthritis   . Hypokalemia   . Erectile dysfunction   . Chronic headaches   . Hyperlipidemia   . Morbid obesity ([place])   . Atrial flutter ([place])   . Hypertensive heart disease   . Microcytic anemia 03/22/2001    Past Medical History:  Diagnosis Date  . Atrial flutter ([place])    s/p cardioversion 06/2010  . Bradycardia    a. Accelerated junctional & sinus bradycardia during 01/2013 adm.  [name]  Chronic headaches   . Degenerative joint disease     End-stage degenerative joint disease, left knee  . Erectile dysfunction   . HTN (hypertension)   . Hypertensive heart disease    a. Admit for CP 01/2013 felt due to this. Cath with normal coronary anatomy.  . Microcytic anemia  03/22/2001  . Morbid obesity ([place])   . NSVT (nonsustained ventricular tachycardia) ([place])    a. During 01/2013 adm.  . Obesity    . Osteoarthritis   . Tendonitis   . Tobacco chew use     History of chewing tobacco usage.   . Urinary tract infection     Pseudomonas urinary tract infection    Current Outpatient Medications  Medication Sig Dispense Refill  . albuterol (VENTOLIN HFA) 108 (90 Base) MCG/ACT inhaler Inhale 2 puffs into the lungs every 6 (six) hours as needed for wheezing. 18 g 0  . amLODipine (NORVASC) 10 MG tablet Take 1 tablet by mouth once daily (Patient taking differently: Take 10 mg by mouth daily. ) 30 tablet 0  . apixaban (ELIQUIS) 5 MG TABS tablet Take 1 tablet (5 mg total) by mouth 2 (two) times daily. 120 tablet 0  . Ascorbic Acid (VITAMIN C) 500 MG CAPS Take 500 mg by mouth daily.     [name] b complex vitamins tablet Take 1 tablet by mouth daily.    . cholecalciferol (VITAMIN D3) 25 MCG (1000 UT) tablet Take 1,000 Units by mouth daily.    . fluticasone (FLONASE) 50 MCG/ACT nasal spray Place 2 sprays into both nostrils daily. 16 g 6  . guaiFENesin (MUCINEX) 600 MG 12 hr tablet Take 600 mg by mouth 2 (two) times daily as needed for cough.    . metoprolol succinate (TOPROL-XL) 25 MG 24 hr tablet Take 1 tablet (25 mg total) by mouth daily. 90 tablet 1  . Multiple Vitamins-Minerals (AIRBORNE PO) Take 1 tablet by mouth daily.    . Multiple Vitamins-Minerals (CENTRUM SILVER PO) Take 1 tablet by mouth daily.    . furosemide (LASIX) 40 MG tablet Take 40 mg by mouth in the morning and at bedtime.    . pravastatin (PRAVACHOL) 40 MG tablet Take 1 tablet (40 mg total) by mouth daily. 90 tablet 3   No current facility-administered medications for this visit.    Allergies  Allergen Reactions  . Ibuprofen   . Tylenol [Acetaminophen]     Social History   Socioeconomic History  . Marital status: Married    Spouse name: Not on file  . Number of children: 6  . Years of education: Not on file  . Highest education level: Not on file  Occupational History  . Occupation: Day Care    Employer: RETIRED   Tobacco Use  . Smoking status: Never Smoker  . Smokeless tobacco: Current User    Types: Chew  Substance and Sexual Activity  . Alcohol use: No  . Drug use: No  . Sexual activity: Yes  Other Topics Concern  . Not on file  Social History Narrative  . Not on file   Social Determinants of Health   Financial Resource Strain:   . Difficulty of Paying Living Expenses: Not on file  Food Insecurity:   . Worried About Charity fundraiser in the Last Year: Not on file  . Ran Out of Food in the Last Year: Not on file  Transportation Needs:   . Lack of Transportation (Medical): Not on file  . Lack of Transportation (  Non-Medical): Not on file  Physical Activity:   . Days of Exercise per Week: Not on file  . Minutes of Exercise per Session: Not on file  Stress:   . Feeling of Stress : Not on file  Social Connections:   . Frequency of Communication with Friends and Family: Not on file  . Frequency of Social Gatherings with Friends and Family: Not on file  . Attends Religious Services: Not on file  . Active Member of Clubs or Organizations: Not on file  . Attends Archivist Meetings: Not on file  . Marital Status: Not on file  Intimate Partner Violence:   . Fear of Current or Ex-Partner: Not on file  . Emotionally Abused: Not on file  . Physically Abused: Not on file  . Sexually Abused: Not on file    ROS See hpi  Objective   Vitals as reported by the patient:  There were no vitals filed for this visit. Unable to perform physical exam  Nikkolas was seen today for follow-up.  Diagnoses and all orders for this visit:  OSA (obstructive sleep apnea)  Encounter for medication refill -     pravastatin (PRAVACHOL) 40 MG tablet; Take 1 tablet (40 mg total) by mouth daily.  Chronic diastolic CHF (congestive heart failure) (HCC)  Stage 3b chronic kidney disease  Chronic gout due to renal impairment of multiple sites without tophus  Acquired thrombophilia  ([place])    1. Renal failure -  Patient evaluated by Nephrology and was started on furosemide At next visit will check CMP+GFR  2. Lower extremity edema - improved with dietary changes and furosemide  3. Gout - not on allopurinol, since his diet changed he has no flares  4. CHF - pt on furosemide, amlodipine, spironolactone, metoprolol. He is not taking his statin thus refill was sent today. Also advised pt to use CPAP but he refuses. Advised to weight himself daily and to remember a 3 pound weight gain in one day is a red flag so he should call the office.   5. Acquired Thrombophilia - urged compliance, pt was concerned about the cost, reviewed the cost without insurance and patient agrees that his out of pocket cost is reasonable Will continue for now on Eliquis  I discussed the assessment and treatment plan with the patient. The patient was provided an opportunity to ask questions and all were answered. The patient agreed with the plan and demonstrated an understanding of the instructions.   The patient was advised to call back or seek an in-person evaluation if the symptoms worsen or if the condition fails to improve as anticipated.  I provided 25 minutes of non-face-to-face time during this encounter.  Forrest Moron, MD  Primary Care at [facility]

## 2019-06-02 NOTE — Patient Instructions (Signed)
° ° ° °  If you have lab work done today you will be contacted with your lab results within the next 2 weeks.  If you have not heard from us then please contact us. The fastest way to get your results is to register for My Chart. ° ° °IF you received an x-ray today, you will receive an invoice from [facility]. Please contact [facility] at [phone] with questions or concerns regarding your invoice.  ° °IF you received labwork today, you will receive an invoice from [facility]. Please contact [facility] at [phone] with questions or concerns regarding your invoice.  ° °Our billing staff will not be able to assist you with questions regarding bills from these companies. ° °You will be contacted with the lab results as soon as they are available. The fastest way to get your results is to activate your My Chart account. Instructions are located on the last page of this paperwork. If you have not heard from us regarding the results in 2 weeks, please contact this office. °  ° ° ° °

## 2019-06-06 ENCOUNTER — Encounter: Payer: Self-pay | Admitting: Family Medicine

## 2019-06-06 ENCOUNTER — Other Ambulatory Visit: Payer: Self-pay

## 2019-06-06 ENCOUNTER — Ambulatory Visit (INDEPENDENT_AMBULATORY_CARE_PROVIDER_SITE_OTHER): Payer: Medicare HMO | Admitting: Family Medicine

## 2019-06-06 VITALS — BP 152/90 | HR 86 | Temp 97.8°F | Resp 17 | Ht 73.0 in | Wt 316.2 lb

## 2019-06-06 DIAGNOSIS — N1831 Chronic kidney disease, stage 3a: Secondary | ICD-10-CM | POA: Diagnosis not present

## 2019-06-06 DIAGNOSIS — N184 Chronic kidney disease, stage 4 (severe): Secondary | ICD-10-CM | POA: Diagnosis not present

## 2019-06-06 DIAGNOSIS — E1121 Type 2 diabetes mellitus with diabetic nephropathy: Secondary | ICD-10-CM | POA: Diagnosis not present

## 2019-06-06 DIAGNOSIS — Z794 Long term (current) use of insulin: Secondary | ICD-10-CM

## 2019-06-06 DIAGNOSIS — Z23 Encounter for immunization: Secondary | ICD-10-CM | POA: Diagnosis not present

## 2019-06-06 DIAGNOSIS — E1122 Type 2 diabetes mellitus with diabetic chronic kidney disease: Secondary | ICD-10-CM

## 2019-06-06 DIAGNOSIS — N171 Acute kidney failure with acute cortical necrosis: Secondary | ICD-10-CM | POA: Diagnosis not present

## 2019-06-06 DIAGNOSIS — N1832 Chronic kidney disease, stage 3b: Secondary | ICD-10-CM | POA: Diagnosis not present

## 2019-06-06 LAB — POCT CBC
Granulocyte percent: 58.2 %G (ref 37–80)
HCT, POC: 30.6 % (ref 29–41)
Hemoglobin: 9.7 g/dL — AB (ref 11–14.6)
Lymph, poc: 1.8 (ref 0.6–3.4)
MCH, POC: 23 pg — AB (ref 27–31.2)
MCHC: 31.8 g/dL (ref 31.8–35.4)
MCV: 72.3 fL — AB (ref 76–111)
MID (cbc): 0.1 (ref 0–0.9)
MPV: 7.5 fL (ref 0–99.8)
POC Granulocyte: 2.6 (ref 2–6.9)
POC LYMPH PERCENT: 39.1 %L (ref 10–50)
POC MID %: 2.7 %M (ref 0–12)
Platelet Count, POC: 321 10*3/uL (ref 142–424)
RBC: 4.23 M/uL — AB (ref 4.69–6.13)
RDW, POC: 18.8 %
WBC: 4.5 10*3/uL — AB (ref 4.6–10.2)

## 2019-06-06 LAB — POCT GLYCOSYLATED HEMOGLOBIN (HGB A1C): Hemoglobin A1C: 6 % — AB (ref 4.0–5.6)

## 2019-06-06 NOTE — Progress Notes (Signed)
Established Patient Office Visit  Subjective:  Patient ID: Nathan Grant, male    DOB: 28-Aug-1945  Age: 74 y.o. MRN: [id number]  CC:  Chief Complaint  Patient presents with  . Hypertension    3 month recheck.  Pt would like documentation of his medical conditions and how serious they are.    HPI Nathan Grant presents for    Patient is here for follow up on labs due to his history of COVID-19 Pneumonia with hypoxia who is now breathing on room air He reports that he has fatigue. He does not get shortness of breath with conversation.  He states that he can do activity now without being as short of breath.  He can now climb steps after completing his therapy.  He now states that he can walk a 1/2 mile for exercise without being winded.  CKD stage IV Patient had CKD stage 3b prior to his hospitalization During his hospital stay he reports that he wasn't sure what was happening to him. Hospital reports shows that he had CKD stage IV He had an [place] during his hospitalization He reports that he had a nurse visit at [facility] today for nurse visit and then appointment on 06/14/2019.    During his hospitalization he was noted to have bilateral DVT with PE with right ventricle strain.  He was at one point on 15L [place] oxygen He was discharged home to do Pulmonary Rehab  He now plans to return the oxygen tanks and is able to exercise without as much shortness of breath.   Wt Readings from Last 3 Encounters:  06/06/19 (!) 316 lb 3.2 oz (143.4 kg)  05/05/19 (!) 312 lb 9.6 oz (141.8 kg)  04/25/19 (!) 320 lb 15.8 oz (145.6 kg)   01/27/19 (!) 325 lb (147.4 kg)  11/28/18 (!) 327 lb 12.8 oz (148.7 kg)  10/28/18 (!) 326 lb 6.4 oz (148.1 kg)       Past Medical History:  Diagnosis Date  . Atrial flutter ([place])    s/p cardioversion 06/2010  . Bradycardia    a. Accelerated junctional & sinus bradycardia during 01/2013 adm.  . Chronic headaches   . Degenerative joint disease    End-stage degenerative joint disease, left knee  . Erectile dysfunction   . HTN (hypertension)   . Hypertensive heart disease    a. Admit for CP 01/2013 felt due to this. Cath with normal coronary anatomy.  . Microcytic anemia  03/22/2001  . Morbid obesity ([place])   . NSVT (nonsustained ventricular tachycardia) ([place])    a. During 01/2013 adm.  . Obesity   . Osteoarthritis   . Tendonitis   . Tobacco chew use     History of chewing tobacco usage.   . Urinary tract infection     Pseudomonas urinary tract infection    Past Surgical History:  Procedure Laterality Date  . CARDIOVERSION  07/01/2010  . LEFT HEART CATHETERIZATION WITH CORONARY ANGIOGRAM N/A 01/16/2013   Procedure: LEFT HEART CATHETERIZATION WITH CORONARY ANGIOGRAM;  Surgeon: Peter M Martinique, MD;  Location: [facility] CATH LAB;  Service: Cardiovascular;  Laterality: N/A;  . TOTAL KNEE ARTHROPLASTY  May 2009   Bilateral  . VASECTOMY      Family History  Problem Relation Age of Onset  . Alcohol abuse Brother   . Hypertension Brother   . Hypertension Mother     Social History   Socioeconomic History  . Marital status: Married    Spouse name: Not on file  .  Number of children: 6  . Years of education: Not on file  . Highest education level: Not on file  Occupational History  . Occupation: Day Care    Employer: RETIRED  Tobacco Use  . Smoking status: Never Smoker  . Smokeless tobacco: Current User    Types: Chew  Substance and Sexual Activity  . Alcohol use: No  . Drug use: No  . Sexual activity: Yes  Other Topics Concern  . Not on file  Social History Narrative  . Not on file   Social Determinants of Health   Financial Resource Strain:   . Difficulty of Paying Living Expenses: Not on file  Food Insecurity:   . Worried About Charity fundraiser in the Last Year: Not on file  . Ran Out of Food in the Last Year: Not on file  Transportation Needs:   . Lack of Transportation (Medical): Not on file  . Lack of  Transportation (Non-Medical): Not on file  Physical Activity:   . Days of Exercise per Week: Not on file  . Minutes of Exercise per Session: Not on file  Stress:   . Feeling of Stress : Not on file  Social Connections:   . Frequency of Communication with Friends and Family: Not on file  . Frequency of Social Gatherings with Friends and Family: Not on file  . Attends Religious Services: Not on file  . Active Member of Clubs or Organizations: Not on file  . Attends Archivist Meetings: Not on file  . Marital Status: Not on file  Intimate Partner Violence:   . Fear of Current or Ex-Partner: Not on file  . Emotionally Abused: Not on file  . Physically Abused: Not on file  . Sexually Abused: Not on file    Outpatient Medications Prior to Visit  Medication Sig Dispense Refill  . albuterol (VENTOLIN HFA) 108 (90 Base) MCG/ACT inhaler Inhale 2 puffs into the lungs every 6 (six) hours as needed for wheezing. 18 g 0  . amLODipine (NORVASC) 10 MG tablet Take 1 tablet by mouth once daily (Patient taking differently: Take 10 mg by mouth daily. ) 30 tablet 0  . apixaban (ELIQUIS) 5 MG TABS tablet Take 1 tablet (5 mg total) by mouth 2 (two) times daily. 120 tablet 0  . Ascorbic Acid (VITAMIN C) 500 MG CAPS Take 500 mg by mouth daily.     [name] b complex vitamins tablet Take 1 tablet by mouth daily.    . cholecalciferol (VITAMIN D3) 25 MCG (1000 UT) tablet Take 1,000 Units by mouth daily.    . fluticasone (FLONASE) 50 MCG/ACT nasal spray Place 2 sprays into both nostrils daily. 16 g 6  . furosemide (LASIX) 40 MG tablet Take 40 mg by mouth in the morning and at bedtime.    [name] guaiFENesin (MUCINEX) 600 MG 12 hr tablet Take 600 mg by mouth 2 (two) times daily as needed for cough.    . metoprolol succinate (TOPROL-XL) 25 MG 24 hr tablet Take 1 tablet (25 mg total) by mouth daily. 90 tablet 1  . Multiple Vitamins-Minerals (AIRBORNE PO) Take 1 tablet by mouth daily.    . Multiple Vitamins-Minerals  (CENTRUM SILVER PO) Take 1 tablet by mouth daily.    . pravastatin (PRAVACHOL) 40 MG tablet Take 1 tablet (40 mg total) by mouth daily. 90 tablet 3   No facility-administered medications prior to visit.    Allergies  Allergen Reactions  . Ibuprofen   . Tylenol [  Acetaminophen]     ROS Review of Systems Review of Systems  Constitutional: Negative for activity change, appetite change, chills and fever.  HENT: Negative for congestion, nosebleeds, trouble swallowing and voice change.   Respiratory: see hpi  Gastrointestinal: Negative for diarrhea, nausea and vomiting.  Genitourinary: Negative for difficulty urinating, dysuria, flank pain and hematuria.  Musculoskeletal: Negative for back pain, joint swelling and neck pain.  Neurological: Negative for dizziness, speech difficulty, light-headedness and numbness.  See HPI. All other review of systems negative.     Objective:     BP (!) 152/90 (BP Location: Right Arm, Patient Position: Sitting, Cuff Size: Large)   Pulse 86   Temp 97.8 F (36.6 C) (Oral)   Resp 17   Ht 6\' 1"  (1.854 m)   Wt (!) 316 lb 3.2 oz (143.4 kg)   SpO2 100%   BMI 41.72 kg/m  Wt Readings from Last 3 Encounters:  06/06/19 (!) 316 lb 3.2 oz (143.4 kg)  05/05/19 (!) 312 lb 9.6 oz (141.8 kg)  04/25/19 (!) 320 lb 15.8 oz (145.6 kg)   Physical Exam Constitutional:      Appearance: Normal appearance. He is obese.  HENT:     Head: Normocephalic and atraumatic.     Right Ear: Tympanic membrane normal.     Left Ear: Tympanic membrane normal.  Eyes:     Extraocular Movements: Extraocular movements intact.     Conjunctiva/sclera: Conjunctivae normal.  Cardiovascular:     Rate and Rhythm: Normal rate and regular rhythm.     Heart sounds: No murmur.  Pulmonary:     Effort: Pulmonary effort is normal. No respiratory distress.     Breath sounds: Normal breath sounds. No stridor. No wheezing or rhonchi.  Abdominal:     General: Bowel sounds are normal. There is  no distension.     Palpations: There is no mass.     Tenderness: There is no abdominal tenderness. There is no right CVA tenderness, left CVA tenderness, guarding or rebound.     Hernia: No hernia is present.  Musculoskeletal:     Cervical back: Normal range of motion and neck supple.  Skin:    General: Skin is warm.     Capillary Refill: Capillary refill takes less than 2 seconds.  Neurological:     General: No focal deficit present.     Mental Status: He is alert and oriented to person, place, and time.  Psychiatric:        Mood and Affect: Mood normal.        Behavior: Behavior normal.        Thought Content: Thought content normal.        Judgment: Judgment normal.      Health Maintenance Due  Topic Date Due  . OPHTHALMOLOGY EXAM  06/19/1954  . TETANUS/TDAP  06/19/1963  . PNA vac Low Risk Adult (1 of 2 - PCV13) 06/18/2009  . INFLUENZA VACCINE  11/12/2018    There are no preventive care reminders to display for this patient.  No results found for: TSH Lab Results  Component Value Date   WBC 4.5 (A) 06/06/2019   HGB 9.7 (A) 06/06/2019   HCT 30.6 06/06/2019   MCV 72.3 (A) 06/06/2019   PLT 206 05/05/2019   Lab Results  Component Value Date   NA 139 06/06/2019   K 4.8 06/06/2019   CO2 24 06/06/2019   GLUCOSE 109 (H) 06/06/2019   BUN 28 (H) 06/06/2019   CREATININE 2.45 (  H) 06/06/2019   BILITOT 0.3 06/06/2019   ALKPHOS 57 06/06/2019   AST 16 06/06/2019   ALT 10 06/06/2019   PROT 6.7 06/06/2019   ALBUMIN 3.9 06/06/2019   CALCIUM 9.4 06/06/2019   ANIONGAP 10 04/28/2019   GFR 66.34 07/07/2013   Lab Results  Component Value Date   CHOL 198 06/06/2019   Lab Results  Component Value Date   HDL 47 06/06/2019   Lab Results  Component Value Date   LDLCALC 129 (H) 06/06/2019   Lab Results  Component Value Date   TRIG 124 06/06/2019   Lab Results  Component Value Date   CHOLHDL 4.2 06/06/2019   Lab Results  Component Value Date   HGBA1C 6.0 (A)  06/06/2019      Assessment & Plan:   Problem List Items Addressed This Visit      Endocrine   Type 2 diabetes mellitus with stage 3 chronic kidney disease, with long-term current use of insulin (HCC) - Primary Improving with compliance   Relevant Orders   Ambulatory referral to Ophthalmology   HM Diabetes Foot Exam (Completed)   POCT glycosylated hemoglobin (Hb A1C) (Completed)   Lipid panel (Completed)   Microalbumin, urine (Completed)     Genitourinary   Acute on chronic renal failure ([place])  - discussed follow up with Nephrology    Other Visit Diagnoses    Need for prophylactic vaccination and inoculation against influenza       Need for prophylactic vaccination against Streptococcus pneumoniae (pneumococcus)          No orders of the defined types were placed in this encounter.   Follow-up: Return in about 4 weeks (around 07/04/2019) for diabetes and kidney disease.    Forrest Moron, MD

## 2019-06-06 NOTE — Patient Instructions (Addendum)
Wt Readings from Last 3 Encounters:  06/06/19 (!) 316 lb 3.2 oz (143.4 kg)  05/05/19 (!) 312 lb 9.6 oz (141.8 kg)  04/25/19 (!) 320 lb 15.8 oz (145.6 kg)   01/27/19 (!) 325 lb (147.4 kg)  11/28/18 (!) 327 lb 12.8 oz (148.7 kg)  10/28/18 (!) 326 lb 6.4 oz (148.1 kg)    Please try to incorporate more plants into your diet. Avoid shellfish which can set off your gout Avoid beef and cut back on sugars.  Lab Results  Component Value Date   HGBA1C 6.0 (A) 06/06/2019     If you have lab work done today you will be contacted with your lab results within the next 2 weeks.  If you have not heard from [place] then please contact us. The fastest way to get your results is to register for My Chart.   IF you received an x-ray today, you will receive an invoice from [facility]. Please contact [facility] at [phone] with questions or concerns regarding your invoice.   IF you received labwork today, you will receive an invoice from [facility]. Please contact [facility] at [phone] with questions or concerns regarding your invoice.   Our billing staff will not be able to assist you with questions regarding bills from these companies.  You will be contacted with the lab results as soon as they are available. The fastest way to get your results is to activate your My Chart account. Instructions are located on the last page of this paperwork. If you have not heard from [place] regarding the results in 2 weeks, please contact this office.

## 2019-06-07 LAB — CMP14+EGFR
ALT: 10 IU/L (ref 0–44)
AST: 16 IU/L (ref 0–40)
Albumin/Globulin Ratio: 1.4 (ref 1.2–2.2)
Albumin: 3.9 g/dL (ref 3.7–4.7)
Alkaline Phosphatase: 57 IU/L (ref 39–117)
BUN/Creatinine Ratio: 11 (ref 10–24)
BUN: 28 mg/dL — ABNORMAL HIGH (ref 8–27)
Bilirubin Total: 0.3 mg/dL (ref 0.0–1.2)
CO2: 24 mmol/L (ref 20–29)
Calcium: 9.4 mg/dL (ref 8.6–10.2)
Chloride: 101 mmol/L (ref 96–106)
Creatinine, Ser: 2.45 mg/dL — ABNORMAL HIGH (ref 0.76–1.27)
GFR calc Af Amer: 29 mL/min/{1.73_m2} — ABNORMAL LOW (ref >59)
GFR calc non Af Amer: 25 mL/min/{1.73_m2} — ABNORMAL LOW (ref >59)
Globulin, Total: 2.8 g/dL (ref 1.5–4.5)
Glucose: 109 mg/dL — ABNORMAL HIGH (ref 65–99)
Potassium: 4.8 mmol/L (ref 3.5–5.2)
Sodium: 139 mmol/L (ref 134–144)
Total Protein: 6.7 g/dL (ref 6.0–8.5)

## 2019-06-07 LAB — LIPID PANEL
Chol/HDL Ratio: 4.2 ratio (ref 0.0–5.0)
Cholesterol, Total: 198 mg/dL (ref 100–199)
HDL: 47 mg/dL (ref >39)
LDL Chol Calc (NIH): 129 mg/dL — ABNORMAL HIGH (ref 0–99)
Triglycerides: 124 mg/dL (ref 0–149)
VLDL Cholesterol Cal: 22 mg/dL (ref 5–40)

## 2019-06-07 LAB — VITAMIN D 25 HYDROXY (VIT D DEFICIENCY, FRACTURES): Vit D, 25-Hydroxy: 35.1 ng/mL (ref 30.0–100.0)

## 2019-06-07 LAB — MICROALBUMIN, URINE: Microalbumin, Urine: 166.4 ug/mL

## 2019-06-07 LAB — PHOSPHORUS: Phosphorus: 3.8 mg/dL (ref 2.8–4.1)

## 2019-06-07 LAB — PTH, INTACT AND CALCIUM
Calcium: 9.2 mg/dL (ref 8.6–10.2)
PTH: 111 pg/mL — ABNORMAL HIGH (ref 15–65)

## 2019-06-09 NOTE — Progress Notes (Signed)
Done, faxed to [phone]

## 2019-06-09 NOTE — Progress Notes (Signed)
Faxed to [facility] [phone]

## 2019-06-12 ENCOUNTER — Other Ambulatory Visit: Payer: Self-pay | Admitting: Family Medicine

## 2019-06-12 ENCOUNTER — Telehealth: Payer: Self-pay | Admitting: Family Medicine

## 2019-06-12 NOTE — Telephone Encounter (Signed)
Copied from [place] [phone]. Topic: Quick Communication - Rx Refill/Question >> Jun 12, 2019  4:23 PM Leward Quan A wrote: Medication: Ferrous Sulfate 324 MG   Has the patient contacted their pharmacy? Yes.   (Agent: If no, request that the patient contact the pharmacy for the refill.) (Agent: If yes, when and what did the pharmacy advise?)  Preferred Pharmacy (with phone number or street name): [facility], [place].  Phone:  [phone] Fax:  [phone]     Agent: Please be advised that RX refills may take up to 3 business days. We ask that you follow-up with your pharmacy.

## 2019-06-12 NOTE — Telephone Encounter (Signed)
Patient called and was told that in order for him to get his oxygen tanks he would need to call his pcp  , he also wanted to know if pcp could put in for a walker as well   Please advise

## 2019-06-12 NOTE — Telephone Encounter (Signed)
Please sign ovc note so the patient's referral can be sent. Thank you.

## 2019-06-12 NOTE — Telephone Encounter (Signed)
Patient is requesting Rx for medication not on current or past medication list- sent for PCP review

## 2019-06-13 NOTE — Telephone Encounter (Signed)
Pt is requesting an RX for Ferrous Sulfate 324 MG but I do not see this on his med list or that you have prescribed before. Please Advise

## 2019-06-14 ENCOUNTER — Other Ambulatory Visit: Payer: Self-pay | Admitting: Family Medicine

## 2019-06-14 DIAGNOSIS — I480 Paroxysmal atrial fibrillation: Secondary | ICD-10-CM | POA: Diagnosis not present

## 2019-06-14 DIAGNOSIS — N1832 Chronic kidney disease, stage 3b: Secondary | ICD-10-CM | POA: Diagnosis not present

## 2019-06-14 DIAGNOSIS — I129 Hypertensive chronic kidney disease with stage 1 through stage 4 chronic kidney disease, or unspecified chronic kidney disease: Secondary | ICD-10-CM | POA: Diagnosis not present

## 2019-06-14 DIAGNOSIS — R609 Edema, unspecified: Secondary | ICD-10-CM | POA: Diagnosis not present

## 2019-06-14 DIAGNOSIS — E875 Hyperkalemia: Secondary | ICD-10-CM | POA: Diagnosis not present

## 2019-06-14 DIAGNOSIS — U071 COVID-19: Secondary | ICD-10-CM | POA: Diagnosis not present

## 2019-06-14 DIAGNOSIS — D649 Anemia, unspecified: Secondary | ICD-10-CM | POA: Diagnosis not present

## 2019-06-14 NOTE — Telephone Encounter (Signed)
Provider aware and will sign 06/06/2019 note so referral can be placed.

## 2019-06-19 NOTE — Telephone Encounter (Signed)
Per note in chart, pt is not on tis med anymore. Pt called for refill. Is the pt still taking med?

## 2019-06-19 NOTE — Telephone Encounter (Signed)
Medication Refill - Medication: spironolactone (ALDACTONE) 25 MG tablet    Has the patient contacted their pharmacy? Yes.   (Agent: If no, request that the patient contact the pharmacy for the refill.) (Agent: If yes, when and what did the pharmacy advise?)  Preferred Pharmacy (with phone number or street name):  [place], [place] Phone:  [phone]  Fax:  [phone]       Agent: Please be advised that RX refills may take up to 3 business days. We ask that you follow-up with your pharmacy.

## 2019-06-20 MED ORDER — SPIRONOLACTONE 25 MG PO TABS: 25.0000 mg | ORAL_TABLET | Freq: Every day | ORAL | 1 refills | Status: DC

## 2019-06-20 NOTE — Telephone Encounter (Signed)
Please let the patient know that the spironolactone was refilled. He should stop taking the multivitamin.

## 2019-06-20 NOTE — Addendum Note (Signed)
Addended by: Delia Chimes A on: 06/20/2019 03:37 PM   Modules accepted: Orders

## 2019-06-26 ENCOUNTER — Other Ambulatory Visit: Payer: Self-pay | Admitting: Pharmacist

## 2019-06-26 NOTE — Patient Outreach (Signed)
[facility]) Care Management  [place]   06/26/2019  Nathan Grant 08/24/1945 [id number]  Reason for referral: medication assistance  Referral source: self Referral medication(s): Eliquis Current insurance:[employer]   HPI:  Patient and his wife Armen Pickup) called to see if the patient would be eligible for medication assistance through [facility] since we helped Mrs. Wendi Snipes. Patient appears to be active.  He has a PMH significant for:  CHF, atrial flutter, gout, CKD stage III, hypertension, hyperlipidemia, morbid obesity, OSA, and type 2 diabetes.  Objective: Allergies  Allergen Reactions  . Ibuprofen Hypertension    Patient stopped taking on his own due to his high blood pressure.  . Tylenol [Acetaminophen]     Patient stopped taking this due to concerns about taking it with hypertension.  Denied any negative effects     Medications Reviewed Today    Reviewed by Elayne Guerin, [facility] (Pharmacist) on 06/26/19 at 1342  Med List Status: <None>  Medication Order Taking? Sig Documenting Provider Last Dose Status Informant  albuterol (VENTOLIN HFA) 108 (90 Base) MCG/ACT inhaler [id number] Yes Inhale 2 puffs into the lungs every 6 (six) hours as needed for wheezing. Rutherford Guys, MD Taking Active Spouse/Significant Other  allopurinol (ZYLOPRIM) 100 MG tablet [id number] Yes Take 1 tablet by mouth daily as needed. [provider] Taking Active   amLODipine (NORVASC) 10 MG tablet [id number] Yes Take 1 tablet by mouth once daily  Patient taking differently: Take 10 mg by mouth daily.    Forrest Moron, MD Taking Active Spouse/Significant Other  apixaban (ELIQUIS) 5 MG TABS tablet [id number] Yes Take 1 tablet (5 mg total) by mouth 2 (two) times daily. Jonetta Osgood, MD Taking Active   Ascorbic Acid (VITAMIN C) 500 MG CAPS [id number] Yes Take 500 mg by mouth daily.  [provider] Taking Active Spouse/Significant Other  b complex vitamins  tablet [id number] Yes Take 1 tablet by mouth daily. [provider] Taking Active Spouse/Significant Other  cholecalciferol (VITAMIN D3) 25 MCG (1000 UT) tablet [id number] Yes Take 1,000 Units by mouth daily. [provider] Taking Active Spouse/Significant Other  ferrous sulfate 324 (65 Fe) MG TBEC [id number] Yes Take 1 tablet by mouth 2 (two) times daily. [provider] Taking Active   fluticasone (FLONASE) 50 MCG/ACT nasal spray [id number] Yes Place 2 sprays into both nostrils daily. Forrest Moron, MD Taking Active Spouse/Significant Other  furosemide (LASIX) 40 MG tablet [id number] Yes Take 40 mg by mouth in the morning and at bedtime. [provider] Taking Active   guaiFENesin (MUCINEX) 600 MG 12 hr tablet [id number] No Take 600 mg by mouth 2 (two) times daily as needed for cough. [provider] Not Taking Active Spouse/Significant Other  losartan (COZAAR) 100 MG tablet [id number] Yes Take 100 mg by mouth daily. [provider] Taking Active   metoprolol succinate (TOPROL-XL) 25 MG 24 hr tablet [id number] Yes Take 1 tablet (25 mg total) by mouth daily. Forrest Moron, MD Taking Active Spouse/Significant Other  Multiple Vitamins-Minerals (CENTRUM SILVER PO) [id number] Yes Take 1 tablet by mouth daily. [provider] Taking Active Spouse/Significant Other  pravastatin (PRAVACHOL) 40 MG tablet [id number] Yes Take 1 tablet (40 mg total) by mouth daily. Forrest Moron, MD Taking Active   spironolactone (ALDACTONE) 25 MG tablet [id number] Yes Take 1 tablet (25 mg total) by mouth daily. Forrest Moron, MD Taking Active           Assessment:  HgA1c-6%  06/06/2019   Medication Assistance Findings:  Medication assistance needs identified: Eliquis   Over Income for Extra Help/LIS  It is unclear if the patient has spent the required 3% of household income to qualify to get Eliquis through [place].  Patient's  wife said she would go to [place] and pick up a print out to see if he has spent enough.  From what they reported, patient would need to spend around $1,000 out-of-pocket.  Additional medication assistance options reviewed with patient as warranted:  No other options identified  Plan: I will follow up with the patient in 2 weeks to see if they have gotten the print outs for both husband and wife from [place]. A determination will be made at that time if the patient is eligible to apply.  Elayne Guerin, PharmD, [place] Clinical Pharmacist [phone]

## 2019-06-27 ENCOUNTER — Other Ambulatory Visit: Payer: Self-pay

## 2019-06-30 ENCOUNTER — Other Ambulatory Visit: Payer: Self-pay | Admitting: Family Medicine

## 2019-06-30 MED ORDER — APIXABAN 5 MG PO TABS: 5.0000 mg | ORAL_TABLET | Freq: Two times a day (BID) | ORAL | 0 refills | Status: DC

## 2019-06-30 NOTE — Telephone Encounter (Signed)
Copied from [place] [id number]. Topic: Quick Communication - Rx Refill/Question >> Jun 30, 2019 12:40 PM Mcneil, Ja-Kwan wrote: Medication: apixaban (ELIQUIS) 5 MG TABS tablet  Has the patient contacted their pharmacy? yes   Preferred Pharmacy (with phone number or street name): [place], [place]. Phone: [phone] Fax: [phone]  Agent: Please be advised that RX refills may take up to 3 business days. We ask that you follow-up with your pharmacy.

## 2019-06-30 NOTE — Telephone Encounter (Signed)
Refill request by historical provider for apixaban (ELIQUIS) 5 MG TABS tablet. LR 04/28/19.

## 2019-07-05 ENCOUNTER — Encounter: Payer: Self-pay | Admitting: Family Medicine

## 2019-07-05 ENCOUNTER — Other Ambulatory Visit: Payer: Self-pay

## 2019-07-05 ENCOUNTER — Ambulatory Visit (INDEPENDENT_AMBULATORY_CARE_PROVIDER_SITE_OTHER): Payer: Medicare HMO | Admitting: Family Medicine

## 2019-07-05 VITALS — BP 159/90 | HR 85 | Temp 98.1°F | Resp 16 | Ht 73.0 in | Wt 324.0 lb

## 2019-07-05 DIAGNOSIS — I5032 Chronic diastolic (congestive) heart failure: Secondary | ICD-10-CM

## 2019-07-05 DIAGNOSIS — N1831 Chronic kidney disease, stage 3a: Secondary | ICD-10-CM | POA: Diagnosis not present

## 2019-07-05 DIAGNOSIS — E1121 Type 2 diabetes mellitus with diabetic nephropathy: Secondary | ICD-10-CM

## 2019-07-05 DIAGNOSIS — J1282 Pneumonia due to coronavirus disease 2019: Secondary | ICD-10-CM | POA: Diagnosis not present

## 2019-07-05 DIAGNOSIS — U071 COVID-19: Secondary | ICD-10-CM | POA: Diagnosis not present

## 2019-07-05 DIAGNOSIS — N1832 Chronic kidney disease, stage 3b: Secondary | ICD-10-CM

## 2019-07-05 DIAGNOSIS — N179 Acute kidney failure, unspecified: Secondary | ICD-10-CM

## 2019-07-05 DIAGNOSIS — D6869 Other thrombophilia: Secondary | ICD-10-CM

## 2019-07-05 DIAGNOSIS — Z794 Long term (current) use of insulin: Secondary | ICD-10-CM

## 2019-07-05 DIAGNOSIS — Z760 Encounter for issue of repeat prescription: Secondary | ICD-10-CM | POA: Diagnosis not present

## 2019-07-05 DIAGNOSIS — E1122 Type 2 diabetes mellitus with diabetic chronic kidney disease: Secondary | ICD-10-CM

## 2019-07-05 MED ORDER — PRAVASTATIN SODIUM 40 MG PO TABS: 40.0000 mg | ORAL_TABLET | Freq: Every day | ORAL | 3 refills | Status: DC

## 2019-07-05 MED ORDER — AMLODIPINE BESYLATE 10 MG PO TABS: 10.0000 mg | ORAL_TABLET | Freq: Every day | ORAL | 3 refills | Status: DC

## 2019-07-05 MED ORDER — METOPROLOL SUCCINATE ER 25 MG PO TB24: 12.5000 mg | ORAL_TABLET | Freq: Every day | ORAL | 1 refills | Status: DC

## 2019-07-05 NOTE — Progress Notes (Signed)
Established Patient Office Visit  Subjective:  Patient ID: Nathan Grant, male    DOB: 1945/06/17  Age: 74 y.o. MRN: [id number]  CC:  Chief Complaint  Patient presents with  . Diabetes    and Kidney disease x 1 month    HPI Nathan Grant presents for   CHF -  Patient states that he is doing water but sparingly He is dieting He is exercising Some shortness of breath and still some edema He is drinking 4 servings of water 16 ounces a day.  Acute on Chronic Renal Failure Patient is avoiding pain, he is not doing any pain relievers He understands not to take NSAIDs He is avoiding salts He is taking his medications He asked for clarification on the lasix He is taking it as instructed  Eliquis  He is compliant with his Eliquis He denies falls No bleeding or bruising  COVID Pneumonia Pt recovered He hasn't use home oxygen in a month He does not need it for exercise He does not use any kind of CPAP (he refuses)  Diabetes Continues to have good diet He does not check his home blood glucose He denies symptoms of hypoglycemia He is taking a beta blocker He moves slowly when he first wakes up He eats 2 meals a day He drinks plenty of water to help with his weight loss   Past Medical History:  Diagnosis Date  . Atrial flutter ([place])    s/p cardioversion 06/2010  . Bradycardia    a. Accelerated junctional & sinus bradycardia during 01/2013 adm.  . Chronic headaches   . Degenerative joint disease     End-stage degenerative joint disease, left knee  . Erectile dysfunction   . HTN (hypertension)   . Hypertensive heart disease    a. Admit for CP 01/2013 felt due to this. Cath with normal coronary anatomy.  . Microcytic anemia  03/22/2001  . Morbid obesity ([place])   . NSVT (nonsustained ventricular tachycardia) ([place])    a. During 01/2013 adm.  . Obesity   . Osteoarthritis   . Tendonitis   . Tobacco chew use     History of chewing tobacco usage.   . Urinary  tract infection     Pseudomonas urinary tract infection    Past Surgical History:  Procedure Laterality Date  . CARDIOVERSION  07/01/2010  . LEFT HEART CATHETERIZATION WITH CORONARY ANGIOGRAM N/A 01/16/2013   Procedure: LEFT HEART CATHETERIZATION WITH CORONARY ANGIOGRAM;  Surgeon: Peter M Martinique, MD;  Location: [facility] CATH LAB;  Service: Cardiovascular;  Laterality: N/A;  . TOTAL KNEE ARTHROPLASTY  May 2009   Bilateral  . VASECTOMY      Family History  Problem Relation Age of Onset  . Alcohol abuse Brother   . Hypertension Brother   . Hypertension Mother     Social History   Socioeconomic History  . Marital status: Married    Spouse name: Not on file  . Number of children: 6  . Years of education: Not on file  . Highest education level: Not on file  Occupational History  . Occupation: Day Care    Employer: RETIRED  Tobacco Use  . Smoking status: Never Smoker  . Smokeless tobacco: Current User    Types: Chew  Substance and Sexual Activity  . Alcohol use: No  . Drug use: No  . Sexual activity: Yes  Other Topics Concern  . Not on file  Social History Narrative  . Not on file   Social Determinants  of Health   Financial Resource Strain:   . Difficulty of Paying Living Expenses:   Food Insecurity:   . Worried About Charity fundraiser in the Last Year:   . Arboriculturist in the Last Year:   Transportation Needs:   . Film/video editor (Medical):   [name] Lack of Transportation (Non-Medical):   Physical Activity:   . Days of Exercise per Week:   . Minutes of Exercise per Session:   Stress:   . Feeling of Stress :   Social Connections:   . Frequency of Communication with Friends and Family:   . Frequency of Social Gatherings with Friends and Family:   . Attends Religious Services:   . Active Member of Clubs or Organizations:   . Attends Archivist Meetings:   [name] Marital Status:   Intimate Partner Violence:   . Fear of Current or Ex-Partner:   .  Emotionally Abused:   [name] Physically Abused:   . Sexually Abused:     Outpatient Medications Prior to Visit  Medication Sig Dispense Refill  . albuterol (VENTOLIN HFA) 108 (90 Base) MCG/ACT inhaler Inhale 2 puffs into the lungs every 6 (six) hours as needed for wheezing. 18 g 0  . allopurinol (ZYLOPRIM) 100 MG tablet Take 1 tablet by mouth daily as needed.    [name] apixaban (ELIQUIS) 5 MG TABS tablet Take 1 tablet (5 mg total) by mouth 2 (two) times daily. 120 tablet 0  . Ascorbic Acid (VITAMIN C) 500 MG CAPS Take 500 mg by mouth daily.     [name] b complex vitamins tablet Take 1 tablet by mouth daily.    . cholecalciferol (VITAMIN D3) 25 MCG (1000 UT) tablet Take 1,000 Units by mouth daily.    . ferrous sulfate 324 (65 Fe) MG TBEC Take 1 tablet by mouth 2 (two) times daily.    . fluticasone (FLONASE) 50 MCG/ACT nasal spray Place 2 sprays into both nostrils daily. 16 g 6  . furosemide (LASIX) 40 MG tablet Take 40 mg by mouth in the morning and at bedtime.    [name] losartan (COZAAR) 100 MG tablet Take 100 mg by mouth daily.    . Multiple Vitamins-Minerals (CENTRUM SILVER PO) Take 1 tablet by mouth daily.    [name] spironolactone (ALDACTONE) 25 MG tablet Take 1 tablet (25 mg total) by mouth daily. 90 tablet 1  . amLODipine (NORVASC) 10 MG tablet Take 1 tablet by mouth once daily (Patient taking differently: Take 10 mg by mouth daily. ) 30 tablet 0  . metoprolol succinate (TOPROL-XL) 25 MG 24 hr tablet Take 1 tablet (25 mg total) by mouth daily. 90 tablet 1  . pravastatin (PRAVACHOL) 40 MG tablet Take 1 tablet (40 mg total) by mouth daily. 90 tablet 3  . guaiFENesin (MUCINEX) 600 MG 12 hr tablet Take 600 mg by mouth 2 (two) times daily as needed for cough.     No facility-administered medications prior to visit.    Allergies  Allergen Reactions  . Ibuprofen Hypertension    Patient stopped taking on his own due to his high blood pressure.  . Tylenol [Acetaminophen]     Patient stopped taking this due to  concerns about taking it with hypertension.  Denied any negative effects     ROS Review of Systems Review of Systems  Constitutional: Negative for activity change, appetite change, chills and fever.  HENT: Negative for congestion, nosebleeds, trouble swallowing and voice change.   Respiratory: Negative  for cough, + shortness of breath with climbing stairs but not with other exercise.  He walks 3/4 of a mile and has no shortness of breath or wheezing.   Gastrointestinal: Negative for diarrhea, nausea and vomiting.  Genitourinary: Negative for difficulty urinating, dysuria, flank pain and hematuria.  Musculoskeletal: Negative for back pain, joint swelling and neck pain.  Neurological: Negative for dizziness, speech difficulty, light-headedness and numbness.  See HPI. All other review of systems negative.     Objective:    Physical Exam  BP (!) 159/90   Pulse 85   Temp 98.1 F (36.7 C) (Temporal)   Resp 16   Ht 6\' 1"  (1.854 m) Comment: per pt  Wt (!) 324 lb (147 kg)   SpO2 98%   BMI 42.75 kg/m   SpO2 98% on room air with ambulation in the practice  Wt Readings from Last 3 Encounters:  07/05/19 (!) 324 lb (147 kg)  06/06/19 (!) 316 lb 3.2 oz (143.4 kg)  05/05/19 (!) 312 lb 9.6 oz (141.8 kg)   Physical Exam  Constitutional: Oriented to person, place, and time. Appears well-developed and well-nourished.  HENT:  Head: Normocephalic and atraumatic.  Eyes: Conjunctivae and EOM are normal.  Cardiovascular: Normal rate, regular rhythm, normal heart sounds and intact distal pulses.  No murmur heard. Pulmonary/Chest: Effort normal and breath sounds normal. No stridor. No respiratory distress. Has no wheezes.  Neurological: Is alert and oriented to person, place, and time.  Skin: Skin is warm. Capillary refill takes less than 2 seconds.  Psychiatric: Has a normal mood and affect. Behavior is normal. Judgment and thought content normal.     CLINICAL DATA:  74 year old male with a  history of acute kidney injury  EXAM: RENAL / URINARY TRACT ULTRASOUND COMPLETE  COMPARISON:  None.  FINDINGS: Right Kidney:  Length: 11.7 cm x 4.8 cm x 6.1 cm, 181 cc. Echogenicity of the right renal cortex relatively increased. No hydronephrosis. Flow confirmed in the hilum of the right kidney. No focal echogenic focus.  Left Kidney:  Length: 10.5 cm x 6.4 cm x 5.4 cm, 190 cc. Echogenicity of the left kidney relatively increased. No hydronephrosis. No focal echogenic focus. Flow confirmed in the hilum.  Bladder:  Bladder partially distended, otherwise unremarkable  IMPRESSION: Negative for hydronephrosis.  Mildly increased echogenicity of the bilateral kidneys, indicating medical renal disease.   Electronically Signed   By: Corrie Mckusick D.O.   On: 04/20/2019 11:16 Health Maintenance Due  Topic Date Due  . OPHTHALMOLOGY EXAM  Never done    There are no preventive care reminders to display for this patient.  No results found for: TSH Lab Results  Component Value Date   WBC 4.5 (A) 06/06/2019   HGB 9.7 (A) 06/06/2019   HCT 30.6 06/06/2019   MCV 72.3 (A) 06/06/2019   PLT 206 05/05/2019   Lab Results  Component Value Date   NA 139 06/06/2019   K 4.8 06/06/2019   CO2 24 06/06/2019   GLUCOSE 109 (H) 06/06/2019   BUN 28 (H) 06/06/2019   CREATININE 2.45 (H) 06/06/2019   BILITOT 0.3 06/06/2019   ALKPHOS 57 06/06/2019   AST 16 06/06/2019   ALT 10 06/06/2019   PROT 6.7 06/06/2019   ALBUMIN 3.9 06/06/2019   CALCIUM 9.4 06/06/2019   ANIONGAP 10 04/28/2019   GFR 66.34 07/07/2013   Lab Results  Component Value Date   CHOL 198 06/06/2019   Lab Results  Component Value Date   HDL  47 06/06/2019   Lab Results  Component Value Date   LDLCALC 129 (H) 06/06/2019   Lab Results  Component Value Date   TRIG 124 06/06/2019   Lab Results  Component Value Date   CHOLHDL 4.2 06/06/2019   Lab Results  Component Value Date   HGBA1C 6.0 (A)  06/06/2019      Assessment & Plan:   Problem List Items Addressed This Visit      Cardiovascular and Mediastinum   Chronic diastolic CHF (congestive heart failure) ([place]) - discussed salt restriction and weight tracking   Relevant Medications   metoprolol succinate (TOPROL-XL) 25 MG 24 hr tablet   pravastatin (PRAVACHOL) 40 MG tablet   amLODipine (NORVASC) 10 MG tablet     Respiratory   Pneumonia due to COVID-19 virus  - may discontinue home oxygen as long as nocturnal pulse oximetry is appropriate (spo2 greater than 93% on room air) He is not on any oxygen as he weaned himself   Relevant Orders   Ambulatory referral to [place]     Endocrine   Type 2 diabetes mellitus with stage 3 chronic kidney disease, with long-term current use of insulin ([place]) - improved with meds and diet   Relevant Medications   pravastatin (PRAVACHOL) 40 MG tablet     Genitourinary   Acute on chronic renal failure (HCC) - Primary  Improved renal function compared to hospitalization levels     Other   Morbid obesity (HCC) (Chronic)  - improving with exercise and maintaining CHF    Other Visit Diagnoses    Encounter for medication refill       Relevant Medications   pravastatin (PRAVACHOL) 40 MG tablet      Meds ordered this encounter  Medications  . metoprolol succinate (TOPROL-XL) 25 MG 24 hr tablet    Sig: Take 0.5 tablets (12.5 mg total) by mouth daily.    Dispense:  45 tablet    Refill:  1    Nephrology changed dose to metoprolol 12.5mg . Please store on file. Pt has lots of pills that he is cutting.  . pravastatin (PRAVACHOL) 40 MG tablet    Sig: Take 1 tablet (40 mg total) by mouth daily.    Dispense:  90 tablet    Refill:  3  . amLODipine (NORVASC) 10 MG tablet    Sig: Take 1 tablet (10 mg total) by mouth daily.    Dispense:  90 tablet    Refill:  3    Follow-up: No follow-ups on file.   A total of 45 minutes were spent face-to-face with the patient during this encounter and  over half of that time was spent on counseling and coordination of care.  Forrest Moron, MD

## 2019-07-05 NOTE — Patient Instructions (Addendum)
If you have lab work done today you will be contacted with your lab results within the next 2 weeks.  If you have not heard from [place] then please contact us. The fastest way to get your results is to register for My Chart.   IF you received an x-ray today, you will receive an invoice from [facility]. Please contact [facility] at [phone] with questions or concerns regarding your invoice.   IF you received labwork today, you will receive an invoice from [place]. Please contact [facility] at [phone] with questions or concerns regarding your invoice.   Our billing staff will not be able to assist you with questions regarding bills from these companies.  You will be contacted with the lab results as soon as they are available. The fastest way to get your results is to activate your My Chart account. Instructions are located on the last page of this paperwork. If you have not heard from [place] regarding the results in 2 weeks, please contact this office.     Heart Failure Action Plan A heart failure action plan helps you understand what to do when you have symptoms of heart failure. Follow the plan that was created by you and your health care provider. Review your plan each time you visit your health care provider. Red zone These signs and symptoms mean you should get medical help right away:  You have trouble breathing when resting.  You have a dry cough that is getting worse.  You have swelling or pain in your legs or abdomen that is getting worse.  You suddenly gain more than 2-3 lb (0.9-1.4 kg) in a day, or more than 5 lb (2.3 kg) in one week. This amount may be more or less depending on your condition.  You have trouble staying awake or you feel confused.  You have chest pain.  You do not have an appetite.  You pass out. If you experience any of these symptoms:  Call your local emergency services (911 in the [place] right away or seek help at the  emergency department of the nearest hospital. Yellow zone These signs and symptoms mean your condition may be getting worse and you should make some changes:  You have trouble breathing when you are active or you need to sleep with extra pillows.  You have swelling in your legs or abdomen.  You gain 2-3 lb (0.9-1.4 kg) in one day, or 5 lb (2.3 kg) in one week. This amount may be more or less depending on your condition.  You get tired easily.  You have trouble sleeping.  You have a dry cough. If you experience any of these symptoms:  Contact your health care provider within the next day.  Your health care provider may adjust your medicines. Green zone These signs mean you are doing well and can continue what you are doing:  You do not have shortness of breath.  You have very little swelling or no new swelling.  Your weight is stable (no gain or loss).  You have a normal activity level.  You do not have chest pain or any other new symptoms. Follow these instructions at home:  Take over-the-counter and prescription medicines only as told by your health care provider.  Weigh yourself daily. Your target weight is __________ lb (__________ kg). ? Call your health care provider if you gain more than __________ lb (__________ kg) in a day, or more than __________ lb (__________ kg) in  one week.  Eat a heart-healthy diet. Work with a diet and nutrition specialist (dietitian) to create an eating plan that is best for you.  Keep all follow-up visits as told by your health care provider. This is important. Where to find more information  American Heart Association: www.heart.org Summary  Follow the action plan that was created by you and your health care provider.  Get help right away if you have any symptoms in the Red zone. This information is not intended to replace advice given to you by your health care provider. Make sure you discuss any questions you have with your  health care provider. Document Revised: 03/12/2017 Document Reviewed: 05/09/2016 Elsevier Patient Education  2020 [employer].

## 2019-07-10 ENCOUNTER — Other Ambulatory Visit: Payer: Self-pay | Admitting: Pharmacist

## 2019-07-10 ENCOUNTER — Encounter: Payer: Self-pay | Admitting: Registered Nurse

## 2019-07-10 ENCOUNTER — Telehealth (INDEPENDENT_AMBULATORY_CARE_PROVIDER_SITE_OTHER): Payer: Medicare HMO | Admitting: Registered Nurse

## 2019-07-10 ENCOUNTER — Other Ambulatory Visit: Payer: Self-pay

## 2019-07-10 DIAGNOSIS — Z86718 Personal history of other venous thrombosis and embolism: Secondary | ICD-10-CM

## 2019-07-10 DIAGNOSIS — S39012A Strain of muscle, fascia and tendon of lower back, initial encounter: Secondary | ICD-10-CM | POA: Diagnosis not present

## 2019-07-10 DIAGNOSIS — M7989 Other specified soft tissue disorders: Secondary | ICD-10-CM

## 2019-07-10 MED ORDER — METHOCARBAMOL 500 MG PO TABS: 500.0000 mg | ORAL_TABLET | Freq: Two times a day (BID) | ORAL | 0 refills | Status: DC | PRN

## 2019-07-10 NOTE — Patient Instructions (Signed)
° ° ° °  If you have lab work done today you will be contacted with your lab results within the next 2 weeks.  If you have not heard from us then please contact us. The fastest way to get your results is to register for My Chart. ° ° °IF you received an x-ray today, you will receive an invoice from [facility]. Please contact [facility] at [phone] with questions or concerns regarding your invoice.  ° °IF you received labwork today, you will receive an invoice from [facility]. Please contact [facility] at [phone] with questions or concerns regarding your invoice.  ° °Our billing staff will not be able to assist you with questions regarding bills from these companies. ° °You will be contacted with the lab results as soon as they are available. The fastest way to get your results is to activate your My Chart account. Instructions are located on the last page of this paperwork. If you have not heard from us regarding the results in 2 weeks, please contact this office. °  ° ° ° °

## 2019-07-10 NOTE — Patient Outreach (Signed)
[facility]) Care Management  07/10/2019  Nathan Grant 1945/12/05 [id number]   Patient was called to follow up on Eliquis patient assistance. Spoke with his wife, Nathan Grant.  HIPAA identifiers were obtained.   Patient and his wife had been asked to get print outs from their pharmacy or review the EOBs from [employer] to see what the patient's medication out-pocket-expenses.  Ms. Nathan Grant said she forgot to go to [employer] but she reviewed their EOBs from [place] and they were not close to spending 3% of their income on medication expenses (estimated to be around $1000).  Plan: [facility])  [place] Team    [facility] pharmacy case will be closed as our team is transitioning from the [place] Management Department into the [facility] Quality Department and will no longer be using [facility] for documentation purposes.     If patient gets the required information, he will be sent to a pharmacy technician for medication assistance work-up.  Elayne Guerin, PharmD, [place] Clinical Pharmacist [phone]

## 2019-07-10 NOTE — Progress Notes (Signed)
Telemedicine Encounter- SOAP NOTE Established Patient  This telephone encounter was conducted with the patient's (or proxy's) verbal consent via audio telecommunications: yes  Patient was instructed to have this encounter in a suitably private space; and to only have persons present to whom they give permission to participate. In addition, patient identity was confirmed by use of name plus two identifiers (DOB and address).  I discussed the limitations, risks, security and privacy concerns of performing an evaluation and management service by telephone and the availability of in person appointments. I also discussed with the patient that there may be a patient responsible charge related to this service. The patient expressed understanding and agreed to proceed.  I spent a total of 12 minutes talking with the patient or their proxy.  Chief Complaint  Patient presents with  . Follow-up    patient states he had covid he had pnuemonia as well states he went through therapy for that part. Per patient he though he was better now he has some back pain , and dark marks on ankles thats hard making it hard to bend for about two weeks      Subjective   Nathan Grant is a 74 y.o. established patient. Telephone visit today for back pain and bruising around ankles  HPI Onset of bruising around 2 weeks ago. Seems to be worsening. Ankles feeling firmer than usual with his baseline edema. Has not changed meds - has remained steady on eliquis with hx of dvt.   Notes as well that he was doing a fair amount of heavy lifting on Friday and Saturday, then had soreness in left lower back starting Sunday. Denies urinary symptoms. No radiculopathy. Feels cramping, tight pain. He feels certain it is msk in nature.   Patient Active Problem List   Diagnosis Date Noted  . Pneumonia due to COVID-19 virus 04/19/2019  . Acute respiratory failure due to COVID-19 ([place]) 04/19/2019  . Wheezing 01/05/2019  .  Post-nasal drip 01/05/2019  . Type 2 diabetes mellitus with stage 3 chronic kidney disease, with long-term current use of insulin ([place]) 04/28/2018  . Uricacidemia 04/28/2018  . Foot callus 04/28/2018  . Onychodystrophy 04/28/2018  . PND (paroxysmal nocturnal dyspnea) 03/08/2018  . Enuresis 03/08/2018  . Chronic gout due to renal impairment of multiple sites without tophus 03/08/2018  . OSA (obstructive sleep apnea) 03/08/2018  . CKD stage 3 due to type 2 diabetes mellitus ([place]) 03/08/2018  . Uncontrolled hypertension 03/08/2018  . Chronic diastolic CHF (congestive heart failure) ([place]) 09/06/2014  . CKD (chronic kidney disease) stage 3, GFR 30-59 ml/min 09/06/2014  . Hypercholesterolemia 12/21/2013  . HTN (hypertension) 01/17/2013  . NSVT (nonsustained ventricular tachycardia) ([place]) 01/17/2013  . Accelerated junctional rhythm 01/17/2013  . Sinus bradycardia 01/17/2013  . Diastolic dysfunction 69/62/9528  . Acute on chronic renal failure ([place]) 05/30/2012  . Osteoarthritis   . Hypokalemia   . Erectile dysfunction   . Chronic headaches   . Hyperlipidemia   . Morbid obesity ([place])   . Atrial flutter ([place])   . Hypertensive heart disease   . Microcytic anemia 03/22/2001    Past Medical History:  Diagnosis Date  . Atrial flutter ([place])    s/p cardioversion 06/2010  . Bradycardia    a. Accelerated junctional & sinus bradycardia during 01/2013 adm.  . Chronic headaches   . Degenerative joint disease     End-stage degenerative joint disease, left knee  . Erectile dysfunction   . HTN (hypertension)   .  Hypertensive heart disease    a. Admit for CP 01/2013 felt due to this. Cath with normal coronary anatomy.  . Microcytic anemia  03/22/2001  . Morbid obesity ([place])   . NSVT (nonsustained ventricular tachycardia) ([place])    a. During 01/2013 adm.  . Obesity   . Osteoarthritis   . Tendonitis   . Tobacco chew use     History of chewing tobacco usage.   . Urinary tract infection      Pseudomonas urinary tract infection    Current Outpatient Medications  Medication Sig Dispense Refill  . albuterol (VENTOLIN HFA) 108 (90 Base) MCG/ACT inhaler Inhale 2 puffs into the lungs every 6 (six) hours as needed for wheezing. 18 g 0  . allopurinol (ZYLOPRIM) 100 MG tablet Take 1 tablet by mouth daily as needed.    [name] amLODipine (NORVASC) 10 MG tablet Take 1 tablet (10 mg total) by mouth daily. 90 tablet 3  . apixaban (ELIQUIS) 5 MG TABS tablet Take 1 tablet (5 mg total) by mouth 2 (two) times daily. 120 tablet 0  . Ascorbic Acid (VITAMIN C) 500 MG CAPS Take 500 mg by mouth daily.     [name] b complex vitamins tablet Take 1 tablet by mouth daily.    . cholecalciferol (VITAMIN D3) 25 MCG (1000 UT) tablet Take 1,000 Units by mouth daily.    . ferrous sulfate 324 (65 Fe) MG TBEC Take 1 tablet by mouth 2 (two) times daily.    . fluticasone (FLONASE) 50 MCG/ACT nasal spray Place 2 sprays into both nostrils daily. 16 g 6  . furosemide (LASIX) 40 MG tablet Take 40 mg by mouth in the morning and at bedtime.    [name] guaiFENesin (MUCINEX) 600 MG 12 hr tablet Take 600 mg by mouth 2 (two) times daily as needed for cough.    . losartan (COZAAR) 100 MG tablet Take 100 mg by mouth daily.    . metoprolol succinate (TOPROL-XL) 25 MG 24 hr tablet Take 0.5 tablets (12.5 mg total) by mouth daily. 45 tablet 1  . Multiple Vitamins-Minerals (CENTRUM SILVER PO) Take 1 tablet by mouth daily.    . pravastatin (PRAVACHOL) 40 MG tablet Take 1 tablet (40 mg total) by mouth daily. 90 tablet 3  . spironolactone (ALDACTONE) 25 MG tablet Take 1 tablet (25 mg total) by mouth daily. 90 tablet 1  . methocarbamol (ROBAXIN) 500 MG tablet Take 1 tablet (500 mg total) by mouth 2 (two) times daily as needed for muscle spasms. 30 tablet 0   No current facility-administered medications for this visit.    Allergies  Allergen Reactions  . Ibuprofen Hypertension    Patient stopped taking on his own due to his high blood pressure.  .  Tylenol [Acetaminophen]     Patient stopped taking this due to concerns about taking it with hypertension.  Denied any negative effects     Social History   Socioeconomic History  . Marital status: Married    Spouse name: Not on file  . Number of children: 6  . Years of education: Not on file  . Highest education level: Not on file  Occupational History  . Occupation: Day Care    Employer: RETIRED  Tobacco Use  . Smoking status: Never Smoker  . Smokeless tobacco: Current User    Types: Chew  Substance and Sexual Activity  . Alcohol use: No  . Drug use: No  . Sexual activity: Yes  Other Topics Concern  . Not on  file  Social History Narrative  . Not on file   Social Determinants of Health   Financial Resource Strain:   . Difficulty of Paying Living Expenses:   Food Insecurity:   . Worried About Charity fundraiser in the Last Year:   . Arboriculturist in the Last Year:   Transportation Needs:   . Film/video editor (Medical):   [name] Lack of Transportation (Non-Medical):   Physical Activity:   . Days of Exercise per Week:   . Minutes of Exercise per Session:   Stress:   . Feeling of Stress :   Social Connections:   . Frequency of Communication with Friends and Family:   . Frequency of Social Gatherings with Friends and Family:   . Attends Religious Services:   . Active Member of Clubs or Organizations:   . Attends Archivist Meetings:   [name] Marital Status:   Intimate Partner Violence:   . Fear of Current or Ex-Partner:   . Emotionally Abused:   [name] Physically Abused:   . Sexually Abused:     Review of Systems  Constitutional: Negative.   HENT: Negative.   Eyes: Negative.   Respiratory: Negative.   Cardiovascular: Positive for leg swelling. Negative for chest pain, palpitations, orthopnea, claudication and PND.  Gastrointestinal: Negative.   Genitourinary: Negative.   Musculoskeletal: Positive for back pain and myalgias. Negative for falls, joint  pain and neck pain.  Skin: Negative.   Neurological: Negative.   Endo/Heme/Allergies: Negative.   Psychiatric/Behavioral: Negative.   All other systems reviewed and are negative.   Objective   Vitals as reported by the patient: There were no vitals filed for this visit.  Louie was seen today for follow-up.  Diagnoses and all orders for this visit:  Strain of lumbar region, initial encounter -     methocarbamol (ROBAXIN) 500 MG tablet; Take 1 tablet (500 mg total) by mouth 2 (two) times daily as needed for muscle spasms.   PLAN  A limited dose of methocarbamol and stretching exercises reviewed with patient for msk pain. Advised to use methocarbamol sparingly given its sedative effect in patients with renal dysfunction. Stretch safely to avoid further injury  With unilateral bruising and worsening swelling as well as history of Dvt, will order [place] to rule out further dvt, though this may be msk as well, especially since he was doing heavy lifting this weekend  Patient encouraged to call clinic with any questions, comments, or concerns.  I discussed the assessment and treatment plan with the patient. The patient was provided an opportunity to ask questions and all were answered. The patient agreed with the plan and demonstrated an understanding of the instructions.   The patient was advised to call back or seek an in-person evaluation if the symptoms worsen or if the condition fails to improve as anticipated.  I provided 12 minutes minutes of non-face-to-face time during this encounter.  Maximiano Coss, NP  Primary [facility] at [facility]

## 2019-07-13 NOTE — Telephone Encounter (Signed)
Spoke to pt and he advises that no one has p/u oxygen.  [facility] and spoke with Jeneen Rinks and has no record of pt in their system.  I reached out to [facility] to see if she can help me find out who it brought it out to pt.  Will call and give verbal to discontinue when I find out who supplied oxygen equipment.

## 2019-07-13 NOTE — Telephone Encounter (Signed)
Lvm on at [phone] to call me back as I need to know the name of the company that brought the oxygen to his home so I can give verbal order to discontinue.

## 2019-07-13 NOTE — Telephone Encounter (Signed)
Please follow up to find out if the oxygen was picked up.  If it was not please call the company and give a verbal order to discontinue home oxygen.

## 2019-07-18 NOTE — Telephone Encounter (Signed)
Tried contacting pt again at [phone], no answer, left vm to return my call.

## 2019-07-25 ENCOUNTER — Telehealth: Payer: Self-pay | Admitting: Family Medicine

## 2019-07-25 NOTE — Telephone Encounter (Signed)
Home Health:  Respiratory Therapy  Spoke with Latoya at [facility]- referral was declined because they only provide DME services.  Latoya advised me to call [facility]. Spoke with [name] at [facility] and referral was declined because they do not provide respiratory therapy.    Melissa spoke with Sharyn Lull at [facility] who states that this is not a covered service under home health and advised pt needed to go to an outpatient location.  Will send FYI to provider.

## 2019-07-25 NOTE — Telephone Encounter (Signed)
Noted: Nathan Grant has sent note to provider.

## 2019-08-02 ENCOUNTER — Encounter: Payer: Self-pay | Admitting: Family Medicine

## 2019-08-02 ENCOUNTER — Ambulatory Visit (INDEPENDENT_AMBULATORY_CARE_PROVIDER_SITE_OTHER): Payer: Medicare HMO | Admitting: Family Medicine

## 2019-08-02 ENCOUNTER — Other Ambulatory Visit: Payer: Self-pay

## 2019-08-02 VITALS — BP 135/82 | HR 93 | Temp 98.0°F | Resp 17 | Ht 73.0 in | Wt 330.2 lb

## 2019-08-02 DIAGNOSIS — N1831 Chronic kidney disease, stage 3a: Secondary | ICD-10-CM

## 2019-08-02 DIAGNOSIS — I5032 Chronic diastolic (congestive) heart failure: Secondary | ICD-10-CM

## 2019-08-02 DIAGNOSIS — E1121 Type 2 diabetes mellitus with diabetic nephropathy: Secondary | ICD-10-CM

## 2019-08-02 DIAGNOSIS — Z794 Long term (current) use of insulin: Secondary | ICD-10-CM

## 2019-08-02 DIAGNOSIS — I77811 Abdominal aortic ectasia: Secondary | ICD-10-CM | POA: Insufficient documentation

## 2019-08-02 DIAGNOSIS — R609 Edema, unspecified: Secondary | ICD-10-CM

## 2019-08-02 DIAGNOSIS — E1122 Type 2 diabetes mellitus with diabetic chronic kidney disease: Secondary | ICD-10-CM

## 2019-08-02 LAB — GLUCOSE, POCT (MANUAL RESULT ENTRY): POC Glucose: 132 mg/dl — AB (ref 70–99)

## 2019-08-02 MED ORDER — FUROSEMIDE 40 MG PO TABS: 40.0000 mg | ORAL_TABLET | Freq: Two times a day (BID) | ORAL | 1 refills | Status: DC

## 2019-08-02 MED ORDER — FERROUS SULFATE 324 (65 FE) MG PO TBEC: 1.0000 | DELAYED_RELEASE_TABLET | Freq: Two times a day (BID) | ORAL | 3 refills | Status: AC

## 2019-08-02 NOTE — Patient Instructions (Addendum)
If you have lab work done today you will be contacted with your lab results within the next 2 weeks.  If you have not heard from [place] then please contact us. The fastest way to get your results is to register for My Chart.   IF you received an x-ray today, you will receive an invoice from [facility]. Please contact [facility] at [phone] with questions or concerns regarding your invoice.   IF you received labwork today, you will receive an invoice from [facility]. Please contact [facility] at [phone] with questions or concerns regarding your invoice.   Our billing staff will not be able to assist you with questions regarding bills from these companies.  You will be contacted with the lab results as soon as they are available. The fastest way to get your results is to activate your My Chart account. Instructions are located on the last page of this paperwork. If you have not heard from [place] regarding the results in 2 weeks, please contact this office.     Heart Failure Eating Plan Heart failure, also called congestive heart failure, occurs when your heart does not pump blood well enough to meet your body's needs for oxygen-rich blood. Heart failure is a long-term (chronic) condition. Living with heart failure can be challenging. However, following your health care provider's instructions about a healthy lifestyle and working with a diet and nutrition specialist (dietitian) to choose the right foods may help to improve your symptoms. What are tips for following this plan? Reading food labels  Check food labels for the amount of sodium per serving. Choose foods that have less than 140 mg (milligrams) of sodium in each serving.  Check food labels for the number of calories per serving. This is important if you need to limit your daily calorie intake to lose weight.  Check food labels for the serving size. If you eat more than one serving, you will be eating more sodium  and calories than what is listed on the label.  Look for foods that are labeled as "sodium-free," "very low sodium," or "low sodium." ? Foods labeled as "reduced sodium" or "lightly salted" may still have more sodium than what is recommended for you. Cooking  Avoid adding salt when cooking. Ask your health care provider or dietitian before using salt substitutes.  Season food with salt-free seasonings, spices, or herbs. Check the label of seasoning mixes to make sure they do not contain salt.  Cook with heart-healthy oils, such as olive, canola, soybean, or sunflower oil.  Do not fry foods. Cook foods using low-fat methods, such as baking, boiling, grilling, and broiling.  Limit unhealthy fats when cooking by: ? Removing the skin from poultry, such as chicken. ? Removing all visible fats from meats. ? Skimming the fat off from stews, soups, and gravies before serving them. Meal planning   Limit your intake of: ? Processed, canned, or pre-packaged foods. ? Foods that are high in trans fat, such as fried foods. ? Sweets, desserts, sugary drinks, and other foods with added sugar. ? Full-fat dairy products, such as whole milk.  Eat a balanced diet that includes: ? 4-5 servings of fruit each day and 4-5 servings of vegetables each day. At each meal, try to fill half of your plate with fruits and vegetables. ? Up to 6-8 servings of whole grains each day. ? Up to 2 servings of lean meat, poultry, or fish each day. One serving of meat is equal to  3 oz. This is about the same size as a deck of cards. ? 2 servings of low-fat dairy each day. ? Heart-healthy fats. Healthy fats called omega-3 fatty acids are found in foods such as flaxseed and cold-water fish like sardines, salmon, and mackerel.  Aim to eat 25-35 g (grams) of fiber a day. Foods that are high in fiber include apples, broccoli, carrots, beans, peas, and whole grains.  Do not add salt or condiments that contain salt (such as soy  sauce) to foods before eating.  When eating at a restaurant, ask that your food be prepared with less salt or no salt, if possible.  Try to eat 2 or more vegetarian meals each week.  Eat more home-cooked food and eat less restaurant, buffet, and fast food. General information  Do not eat more than 2,300 mg of salt (sodium) a day. The amount of sodium that is recommended for you may be lower, depending on your condition.  Maintain a healthy body weight as directed. Ask your health care provider what a healthy weight is for you. ? Check your weight every day. ? Work with your health care provider and dietitian to make a plan that is right for you to lose weight or maintain your current weight.  Limit how much fluid you drink. Ask your health care provider or dietitian how much fluid you can have each day.  Limit or avoid alcohol as told by your health care provider or dietitian. Recommended foods The items listed may not be a complete list. Talk with your dietitian about what dietary choices are best for you. Fruits All fresh, frozen, and canned fruits. Dried fruits, such as raisins, prunes, and cranberries. Vegetables All fresh vegetables. Vegetables that are frozen without sauce or added salt. Low-sodium or sodium-free canned vegetables. Grains Bread with less than 80 mg of sodium per slice. Whole-wheat pasta, quinoa, and brown rice. Oats and oatmeal. Barley. [place]. Grits and cream of wheat. Whole-grain and whole-wheat cold cereal. Meats and other protein foods Lean cuts of meat. Skinless chicken and [place]. Fish with high omega-3 fatty acids, such as salmon, sardines, and other cold-water fishes. Eggs. Dried beans, peas, and edamame. Unsalted nuts and nut butters. Dairy Low-fat or nonfat (skim) milk and dried milk. Rice milk, soy milk, and almond milk. Low-fat or nonfat yogurt. Small amounts of reduced-sodium block cheese. Low-sodium cottage cheese. Fats and oils Olive, canola,  soybean, flaxseed, or sunflower oil. Avocado. Sweets and desserts Apple sauce. Granola bars. Sugar-free pudding and gelatin. Frozen fruit bars. Seasoning and other foods Fresh and dried herbs. Lemon or lime juice. Vinegar. Low-sodium ketchup. Salt-free marinades, salad dressings, sauces, and seasonings. The items listed above may not be a complete list of foods and beverages you can eat. Contact a dietitian for more information. Foods to avoid The items listed may not be a complete list. Talk with your dietitian about what dietary choices are best for you. Fruits Fruits that are dried with sodium-containing preservatives. Vegetables Canned vegetables. Frozen vegetables with sauce or seasonings. Creamed vegetables. [place] fries. Onion rings. Pickled vegetables and sauerkraut. Grains Bread with more than 80 mg of sodium per slice. Hot or cold cereal with more than 140 mg sodium per serving. Salted pretzels and crackers. Pre-packaged breadcrumbs. Bagels, croissants, and biscuits. Meats and other protein foods Ribs and chicken wings. Bacon, ham, pepperoni, bologna, salami, and packaged luncheon meats. Hot dogs, bratwurst, and sausage. Canned meat. Smoked meat and fish. Salted nuts and seeds. Dairy Whole milk, half-and-half,  and cream. Buttermilk. Processed cheese, cheese spreads, and cheese curds. Regular cottage cheese. Feta cheese. Shredded cheese. String cheese. Fats and oils Butter, lard, shortening, ghee, and bacon fat. Canned and packaged gravies. Seasoning and other foods Onion salt, garlic salt, table salt, and sea salt. Marinades. Regular salad dressings. Relishes, pickles, and olives. Meat flavorings and tenderizers, and bouillon cubes. Horseradish, ketchup, and mustard. Worcestershire sauce. Teriyaki sauce, soy sauce (including reduced sodium). Hot sauce and Tabasco sauce. Steak sauce, fish sauce, oyster sauce, and cocktail sauce. Taco seasonings. Barbecue sauce. Tartar sauce. The items  listed above may not be a complete list of foods and beverages you should avoid. Contact a dietitian for more information. Summary  A heart failure eating plan includes changes that limit your intake of sodium and unhealthy fat, and it may help you lose weight or maintain a healthy weight. Your health care provider may also recommend limiting how much fluid you drink.  Most people with heart failure should eat no more than 2,300 mg of salt (sodium) a day. The amount of sodium that is recommended for you may be lower, depending on your condition.  Contact your health care provider or dietitian before making any major changes to your diet. This information is not intended to replace advice given to you by your health care provider. Make sure you discuss any questions you have with your health care provider. Document Revised: 05/26/2018 Document Reviewed: 08/14/2016 Elsevier Patient Education  [place].

## 2019-08-02 NOTE — Progress Notes (Signed)
Established Patient Office Visit  Subjective:  Patient ID: Nathan Grant, male    DOB: [birth year]  Age: 74 y.o. MRN: [id number]  CC:  Chief Complaint  Patient presents with  . Diabetes    1 month f/u    HPI [name] presents for   Heart Failure  Patient reports that he is taking his blood pressure medications as instructed  He admits that he is eating more foods due to feeling better but tries to avoid salts and does not drink too much liquids. He reports that his feet have been swollen and remain that way. He wears compression stockings at home.  BP Readings from Last 3 Encounters:  08/02/19 135/82  07/05/19 (!) 159/90  06/06/19 (!) 152/90   He states that he is eating more food but is avoiding red meats and fatty foods and is mostly eating fish.  Wt Readings from Last 3 Encounters:  08/02/19 (!) 330 lb 3.2 oz (149.8 kg)  07/05/19 (!) 324 lb (147 kg)  06/06/19 (!) 316 lb 3.2 oz (143.4 kg)   Lab Results  Component Value Date   HGBA1C 6.0 (A) 06/06/2019      Past Medical History:  Diagnosis Date  . Atrial flutter ([place])    s/p cardioversion 06/2010  . Bradycardia    a. Accelerated junctional & sinus bradycardia during 01/2013 adm.  . Chronic headaches   . Degenerative joint disease     End-stage degenerative joint disease, left knee  . Erectile dysfunction   . HTN (hypertension)   . Hypertensive heart disease    a. Admit for CP 01/2013 felt due to this. Cath with normal coronary anatomy.  . Microcytic anemia  03/22/2001  . Morbid obesity ([place])   . NSVT (nonsustained ventricular tachycardia) ([place])    a. During 01/2013 adm.  . Obesity   . Osteoarthritis   . Tendonitis   . Tobacco chew use     History of chewing tobacco usage.   . Urinary tract infection     Pseudomonas urinary tract infection    Past Surgical History:  Procedure Laterality Date  . CARDIOVERSION  07/01/2010  . LEFT HEART CATHETERIZATION WITH CORONARY ANGIOGRAM N/A 01/16/2013    Procedure: LEFT HEART CATHETERIZATION WITH CORONARY ANGIOGRAM;  Surgeon: Peter M Martinique, MD;  Location: [facility] CATH LAB;  Service: Cardiovascular;  Laterality: N/A;  . TOTAL KNEE ARTHROPLASTY  May 2009   Bilateral  . VASECTOMY      Family History  Problem Relation Age of Onset  . Alcohol abuse Brother   . Hypertension Brother   . Hypertension Mother     Social History   Socioeconomic History  . Marital status: Married    Spouse name: Not on file  . Number of children: 6  . Years of education: Not on file  . Highest education level: Not on file  Occupational History  . Occupation: Day Care    Employer: RETIRED  Tobacco Use  . Smoking status: Never Smoker  . Smokeless tobacco: Current User    Types: Chew  Substance and Sexual Activity  . Alcohol use: No  . Drug use: No  . Sexual activity: Yes  Other Topics Concern  . Not on file  Social History Narrative  . Not on file   Social Determinants of Health   Financial Resource Strain:   . Difficulty of Paying Living Expenses:   Food Insecurity:   . Worried About Charity fundraiser in the Last Year:   .  Ran Out of Food in the Last Year:   Transportation Needs:   . Film/video editor (Medical):   [name] Lack of Transportation (Non-Medical):   Physical Activity:   . Days of Exercise per Week:   . Minutes of Exercise per Session:   Stress:   . Feeling of Stress :   Social Connections:   . Frequency of Communication with Friends and Family:   . Frequency of Social Gatherings with Friends and Family:   . Attends Religious Services:   . Active Member of Clubs or Organizations:   . Attends Archivist Meetings:   [name] Marital Status:   Intimate Partner Violence:   . Fear of Current or Ex-Partner:   . Emotionally Abused:   [name] Physically Abused:   . Sexually Abused:     Outpatient Medications Prior to Visit  Medication Sig Dispense Refill  . albuterol (VENTOLIN HFA) 108 (90 Base) MCG/ACT inhaler Inhale 2 puffs into  the lungs every 6 (six) hours as needed for wheezing. 18 g 0  . allopurinol (ZYLOPRIM) 100 MG tablet Take 1 tablet by mouth daily as needed.    [name] amLODipine (NORVASC) 10 MG tablet Take 1 tablet (10 mg total) by mouth daily. 90 tablet 3  . apixaban (ELIQUIS) 5 MG TABS tablet Take 1 tablet (5 mg total) by mouth 2 (two) times daily. 120 tablet 0  . Ascorbic Acid (VITAMIN C) 500 MG CAPS Take 500 mg by mouth daily.     [name] b complex vitamins tablet Take 1 tablet by mouth daily.    . cholecalciferol (VITAMIN D3) 25 MCG (1000 UT) tablet Take 1,000 Units by mouth daily.    . fluticasone (FLONASE) 50 MCG/ACT nasal spray Place 2 sprays into both nostrils daily. 16 g 6  . losartan (COZAAR) 100 MG tablet Take 100 mg by mouth daily.    . methocarbamol (ROBAXIN) 500 MG tablet Take 1 tablet (500 mg total) by mouth 2 (two) times daily as needed for muscle spasms. 30 tablet 0  . metoprolol succinate (TOPROL-XL) 25 MG 24 hr tablet Take 0.5 tablets (12.5 mg total) by mouth daily. 45 tablet 1  . Multiple Vitamins-Minerals (CENTRUM SILVER PO) Take 1 tablet by mouth daily.    . pravastatin (PRAVACHOL) 40 MG tablet Take 1 tablet (40 mg total) by mouth daily. 90 tablet 3  . spironolactone (ALDACTONE) 25 MG tablet Take 1 tablet (25 mg total) by mouth daily. 90 tablet 1  . ferrous sulfate 324 (65 Fe) MG TBEC Take 1 tablet by mouth 2 (two) times daily.    . furosemide (LASIX) 40 MG tablet Take 40 mg by mouth in the morning and at bedtime.    [name] guaiFENesin (MUCINEX) 600 MG 12 hr tablet Take 600 mg by mouth 2 (two) times daily as needed for cough.     No facility-administered medications prior to visit.    Allergies  Allergen Reactions  . Ibuprofen Hypertension    Patient stopped taking on his own due to his high blood pressure.  . Tylenol [Acetaminophen]     Patient stopped taking this due to concerns about taking it with hypertension.  Denied any negative effects     ROS Review of Systems    Objective:     Physical Exam  BP 135/82 (BP Location: Right Arm, Patient Position: Sitting, Cuff Size: Large)   Pulse 93   Temp 98 F (36.7 C) (Temporal)   Resp 17   Ht 6\' 1"  (1.854 m)  Wt (!) 330 lb 3.2 oz (149.8 kg)   SpO2 96%   BMI 43.56 kg/m  Wt Readings from Last 3 Encounters:  08/02/19 (!) 330 lb 3.2 oz (149.8 kg)  07/05/19 (!) 324 lb (147 kg)  06/06/19 (!) 316 lb 3.2 oz (143.4 kg)   Physical Exam  Constitutional: Oriented to person, place, and time. Appears well-developed and well-nourished.  HENT:  Head: Normocephalic and atraumatic.  Eyes: Conjunctivae and EOM are normal.  Cardiovascular: Normal rate, regular rhythm, normal heart sounds and intact distal pulses.  No murmur heard. Pulmonary/Chest: Effort normal and breath sounds normal. No stridor. No respiratory distress. Has no wheezes.  Extremity: pitting edema to the mid shin bilaterally Neurological: Is alert and oriented to person, place, and time.  Skin: Skin is warm. Capillary refill takes less than 2 seconds.  Psychiatric: Has a normal mood and affect. Behavior is normal. Judgment and thought content normal.    Health Maintenance Due  Topic Date Due  . OPHTHALMOLOGY EXAM  Never done  . COVID-19 Vaccine (1) Never done    There are no preventive care reminders to display for this patient.  No results found for: TSH Lab Results  Component Value Date   WBC 4.5 (A) 06/06/2019   HGB 9.7 (A) 06/06/2019   HCT 30.6 06/06/2019   MCV 72.3 (A) 06/06/2019   PLT 206 05/05/2019   Lab Results  Component Value Date   NA 139 06/06/2019   K 4.8 06/06/2019   CO2 24 06/06/2019   GLUCOSE 109 (H) 06/06/2019   BUN 28 (H) 06/06/2019   CREATININE 2.45 (H) 06/06/2019   BILITOT 0.3 06/06/2019   ALKPHOS 57 06/06/2019   AST 16 06/06/2019   ALT 10 06/06/2019   PROT 6.7 06/06/2019   ALBUMIN 3.9 06/06/2019   CALCIUM 9.4 06/06/2019   ANIONGAP 10 04/28/2019   GFR 66.34 07/07/2013   Lab Results  Component Value Date   CHOL 198  06/06/2019   Lab Results  Component Value Date   HDL 47 06/06/2019   Lab Results  Component Value Date   LDLCALC 129 (H) 06/06/2019   Lab Results  Component Value Date   TRIG 124 06/06/2019   Lab Results  Component Value Date   CHOLHDL 4.2 06/06/2019   Lab Results  Component Value Date   HGBA1C 6.0 (A) 06/06/2019      Assessment & Plan:   Problem List Items Addressed This Visit      Cardiovascular and Mediastinum   Chronic diastolic CHF (congestive heart failure) ([place])  - advised to add an extra half tablet today at lunch to help with his edema and fluid retention then go back to furosemide 40mg  bid   Relevant Medications   furosemide (LASIX) 40 MG tablet     Endocrine   Type 2 diabetes mellitus with stage 3 chronic kidney disease, with long-term current use of insulin ([place]) - Primary -  Discussed that he should adhere to a strict diabetic diet   Relevant Orders   POCT glucose (manual entry)    Other Visit Diagnoses    Pitting edema    -  Discussed elevating lower extremities and keeping compression stockings DASH diet emphasized     Advised to take an extra half tablet of furosemide with lunch time  Meds ordered this encounter  Medications  . furosemide (LASIX) 40 MG tablet    Sig: Take 1 tablet (40 mg total) by mouth in the morning and at bedtime.  Dispense:  180 tablet    Refill:  1  . ferrous sulfate 324 (65 Fe) MG TBEC    Sig: Take 1 tablet (325 mg total) by mouth 2 (two) times daily.    Dispense:  180 tablet    Refill:  3    90 day supply to  improve compliance    Follow-up: No follow-ups on file.   A total of 25 minutes were spent face-to-face with the patient during this encounter and over half of that time was spent on counseling and coordination of care.   Forrest Moron, MD

## 2019-08-14 ENCOUNTER — Encounter: Payer: Self-pay | Admitting: Family Medicine

## 2019-08-14 ENCOUNTER — Ambulatory Visit (INDEPENDENT_AMBULATORY_CARE_PROVIDER_SITE_OTHER): Payer: Medicare HMO

## 2019-08-14 ENCOUNTER — Ambulatory Visit (INDEPENDENT_AMBULATORY_CARE_PROVIDER_SITE_OTHER): Payer: Medicare HMO | Admitting: Family Medicine

## 2019-08-14 ENCOUNTER — Other Ambulatory Visit: Payer: Self-pay

## 2019-08-14 VITALS — BP 128/82 | HR 83 | Temp 97.3°F | Resp 17 | Ht 73.0 in | Wt 331.8 lb

## 2019-08-14 DIAGNOSIS — E1122 Type 2 diabetes mellitus with diabetic chronic kidney disease: Secondary | ICD-10-CM

## 2019-08-14 DIAGNOSIS — E1121 Type 2 diabetes mellitus with diabetic nephropathy: Secondary | ICD-10-CM | POA: Diagnosis not present

## 2019-08-14 DIAGNOSIS — N1831 Chronic kidney disease, stage 3a: Secondary | ICD-10-CM | POA: Diagnosis not present

## 2019-08-14 DIAGNOSIS — M25511 Pain in right shoulder: Secondary | ICD-10-CM

## 2019-08-14 DIAGNOSIS — Z794 Long term (current) use of insulin: Secondary | ICD-10-CM | POA: Diagnosis not present

## 2019-08-14 MED ORDER — LIDOCAINE HCL (PF) 2 % IJ SOLN
2.0000 mL | Freq: Once | INTRAMUSCULAR | Status: AC
  Administered 2019-08-14: 2 mL

## 2019-08-14 MED ORDER — BLOOD GLUCOSE METER KIT: PACK | 0 refills | Status: AC

## 2019-08-14 MED ORDER — TRIAMCINOLONE ACETONIDE 40 MG/ML IJ SUSP
60.0000 mg | Freq: Once | INTRAMUSCULAR | Status: AC
  Administered 2019-08-14: 60 mg via INTRAMUSCULAR

## 2019-08-14 NOTE — Patient Instructions (Addendum)
If you have lab work done today you will be contacted with your lab results within the next 2 weeks.  If you have not heard from [place] then please contact us. The fastest way to get your results is to register for My Chart.   IF you received an x-ray today, you will receive an invoice from [facility]. Please contact [facility] at [phone] with questions or concerns regarding your invoice.   IF you received labwork today, you will receive an invoice from [facility]. Please contact [facility] at [phone] with questions or concerns regarding your invoice.   Our billing staff will not be able to assist you with questions regarding bills from these companies.  You will be contacted with the lab results as soon as they are available. The fastest way to get your results is to activate your My Chart account. Instructions are located on the last page of this paperwork. If you have not heard from [place] regarding the results in 2 weeks, please contact this office.     Joint Steroid Injection A joint steroid injection is a procedure to relieve swelling and pain in a joint. Steroids are medicines that reduce inflammation. In this procedure, your health care provider uses a syringe and a needle to inject a steroid medicine into a painful and inflamed joint. A pain-relieving medicine (anesthetic) may be injected along with the steroid. In some cases, your health care provider may use an imaging technique such as ultrasound or fluoroscopy to guide the injection. Joints that are often treated with steroid injections include the knee, shoulder, hip, and spine. These injections may also be used in the elbow, ankle, and joints of the hands or feet. You may have joint steroid injections as part of your treatment for inflammation caused by:  Gout.  Rheumatoid arthritis.  Advanced wear-and-tear arthritis (osteoarthritis).  Tendinitis.  Bursitis. Joint steroid injections may be repeated,  but having them too often can damage a joint or the skin over the joint. You should not have joint steroid injections less than 6 weeks apart or more than four times a year. Tell a health care provider about:  Any allergies you have.  All medicines you are taking, including vitamins, herbs, eye drops, creams, and over-the-counter medicines.  Any problems you or family members have had with anesthetic medicines.  Any blood disorders you have.  Any surgeries you have had.  Any medical conditions you have.  Whether you are pregnant or may be pregnant. What are the risks? Generally, this is a safe treatment. However, problems may occur, including:  Infection.  Bleeding.  Allergic reactions to medicines.  Damage to the joint or tissues around the joint.  Thinning of skin or loss of skin color over the joint.  Temporary flushing of the face or chest.  Temporary increase in pain.  Temporary increase in blood sugar.  Failure to relieve inflammation or pain. What happens before the treatment?  You may have imaging tests of your joint.  Ask your health care provider about: ? Changing or stopping your regular medicines. This is especially important if you are taking diabetes medicines or blood thinners. ? Taking medicines such as aspirin and ibuprofen. These medicines can thin your blood. Do not take these medicines unless your health care provider tells you to take them. ? Taking over-the-counter medicines, vitamins, herbs, and supplements.  Ask your health care provider if you can drive yourself home after the procedure. What happens during the treatment?  Your health care provider will position you for the injection and locate the injection site over your joint.  The skin over the joint will be cleaned with a germ-killing soap.  Your health care provider may: ? Spray a numbing solution (topical anesthetic) over the injection site. ? Inject a local anesthetic under the  skin above your joint.  The needle will be placed through your skin into your joint. Your health care provider may use imaging to guide the needle to the right spot for the injection. If imaging is used, a special contrast dye may be injected to confirm that the needle is in the correct location.  The steroid medicine will be injected into your joint.  Anesthetic may be injected along with the steroid. This may be a medicine that relieves pain for a short time (short-acting anesthetic) or for a longer time (long-acting anesthetic).  The needle will be removed, and an adhesive bandage (dressing) will be placed over the injection site. The procedure may vary among health care providers and hospitals. What can I expect after the treatment?  You will be able to go home after the treatment.  It is normal to feel slight flushing for a few days after the injection.  After the treatment, it is common to have an increase in joint pain after the anesthetic has worn off. This may happen about an hour after a short-acting anesthetic or about 8 hours after a longer-acting anesthetic.  You should begin to feel relief from joint pain and swelling after 24 to 48 hours. Follow these instructions at home: Injection site care  Leave the adhesive dressing over your injection site in place until your health care provider says you can remove it.  Check your injection site every day for signs of infection. Check for: ? Redness, swelling, or pain. ? Fluid or blood. ? Warmth. ? Pus or a bad smell. Activity  Return to your normal activities as told by your health care provider. Ask your health care provider what activities are safe for you. You may be asked to limit activities that put stress on the joint for a few days.  Do joint exercises as told by your health care provider.  Do not take baths, swim, or use a hot tub until your health care provider approves. Managing pain, stiffness, and  swelling   If directed, put ice on the joint. ? Put ice in a plastic bag. ? Place a towel between your skin and the bag. ? Leave the ice on for 20 minutes, 2-3 times a day.  Raise (elevate) your joint above the level of your heart when you are sitting or lying down. General instructions  Take over-the-counter and prescription medicines only as told by your health care provider.  Do not use any products that contain nicotine or tobacco, such as cigarettes, e-cigarettes, and chewing tobacco. These can delay joint healing. If you need help quitting, ask your health care provider.  If you have diabetes, be aware that your blood sugar may be slightly elevated for several days after the injection.  Keep all follow-up visits as told by your health care provider. This is important. Contact a health care provider if you have:  Chills or a fever.  Any signs of infection at your injection site.  Increased pain or swelling or no relief after 2 days. Summary  A joint steroid injection is a treatment to relieve pain and swelling in a joint.  Steroids are medicines that reduce inflammation.  Your health care provider may add an anesthetic along with the steroid.  You may have joint steroid injections as part of your arthritis treatment.  Joint steroid injections may be repeated, but having them too often can damage a joint or the skin over the joint.  Contact your health care provider if you have a fever, chills, or signs of infection or if you get no relief from joint pain or swelling. This information is not intended to replace advice given to you by your health care provider. Make sure you discuss any questions you have with your health care provider. Document Revised: 11/30/2017 Document Reviewed: 11/30/2017 Elsevier Patient Education  2020 [employer].

## 2019-08-14 NOTE — Progress Notes (Signed)
Established Patient Office Visit  Subjective:  Patient ID: Nathan Grant, male    DOB: 1945/11/25  Age: 74 y.o. MRN: [id number]  CC:  Chief Complaint  Patient presents with  . Shoulder Pain    x 4 days, per pt pain is in the joint and has trouble lifting his arm-he can only lift half way.   Pain level 10/10 and has tried otc arthritis pain med with some relief with taking first tab, took tablet last night without any relief    HPI Nathan Grant presents for   Patient reports that starting 4 days ago he had stiffness and could not raise his shoulder. Due to his heart disease  He cannot take pain meds beside tylenol.  He states that he can only lift his arm half way so getting dressed is difficult.  If he moves it wrong the pain is 10/10. No fevers or chills.  No arm weakness  Past Medical History:  Diagnosis Date  . Atrial flutter ([place])    s/p cardioversion 06/2010  . Bradycardia    a. Accelerated junctional & sinus bradycardia during 01/2013 adm.  . Chronic headaches   . Degenerative joint disease     End-stage degenerative joint disease, left knee  . Erectile dysfunction   . HTN (hypertension)   . Hypertensive heart disease    a. Admit for CP 01/2013 felt due to this. Cath with normal coronary anatomy.  . Microcytic anemia  03/22/2001  . Morbid obesity ([place])   . NSVT (nonsustained ventricular tachycardia) ([place])    a. During 01/2013 adm.  . Obesity   . Osteoarthritis   . Tendonitis   . Tobacco chew use     History of chewing tobacco usage.   . Urinary tract infection     Pseudomonas urinary tract infection    Past Surgical History:  Procedure Laterality Date  . CARDIOVERSION  07/01/2010  . LEFT HEART CATHETERIZATION WITH CORONARY ANGIOGRAM N/A 01/16/2013   Procedure: LEFT HEART CATHETERIZATION WITH CORONARY ANGIOGRAM;  Surgeon: Nathan M Martinique, MD;  Location: [facility] CATH LAB;  Service: Cardiovascular;  Laterality: N/A;  . TOTAL KNEE ARTHROPLASTY  May 2009   Bilateral   . VASECTOMY      Family History  Problem Relation Age of Onset  . Alcohol abuse Brother   . Hypertension Brother   . Hypertension Mother     Social History   Socioeconomic History  . Marital status: Married    Spouse name: Not on file  . Number of children: 6  . Years of education: Not on file  . Highest education level: Not on file  Occupational History  . Occupation: Day Care    Employer: RETIRED  Tobacco Use  . Smoking status: Never Smoker  . Smokeless tobacco: Current User    Types: Chew  Substance and Sexual Activity  . Alcohol use: No  . Drug use: No  . Sexual activity: Yes  Other Topics Concern  . Not on file  Social History Narrative  . Not on file   Social Determinants of Health   Financial Resource Strain:   . Difficulty of Paying Living Expenses:   Food Insecurity:   . Worried About Charity fundraiser in the Last Year:   . Arboriculturist in the Last Year:   Transportation Needs:   . Film/video editor (Medical):   [name] Lack of Transportation (Non-Medical):   Physical Activity:   . Days of Exercise per Week:   .  Minutes of Exercise per Session:   Stress:   . Feeling of Stress :   Social Connections:   . Frequency of Communication with Friends and Family:   . Frequency of Social Gatherings with Friends and Family:   . Attends Religious Services:   . Active Member of Clubs or Organizations:   . Attends Archivist Meetings:   [name] Marital Status:   Intimate Partner Violence:   . Fear of Current or Ex-Partner:   . Emotionally Abused:   [name] Physically Abused:   . Sexually Abused:     Outpatient Medications Prior to Visit  Medication Sig Dispense Refill  . albuterol (VENTOLIN HFA) 108 (90 Base) MCG/ACT inhaler Inhale 2 puffs into the lungs every 6 (six) hours as needed for wheezing. 18 g 0  . allopurinol (ZYLOPRIM) 100 MG tablet Take 1 tablet by mouth daily as needed.    [name] amLODipine (NORVASC) 10 MG tablet Take 1 tablet (10 mg total) by  mouth daily. 90 tablet 3  . apixaban (ELIQUIS) 5 MG TABS tablet Take 1 tablet (5 mg total) by mouth 2 (two) times daily. 120 tablet 0  . Ascorbic Acid (VITAMIN C) 500 MG CAPS Take 500 mg by mouth daily.     [name] b complex vitamins tablet Take 1 tablet by mouth daily.    . cholecalciferol (VITAMIN D3) 25 MCG (1000 UT) tablet Take 1,000 Units by mouth daily.    . ferrous sulfate 324 (65 Fe) MG TBEC Take 1 tablet (325 mg total) by mouth 2 (two) times daily. 180 tablet 3  . fluticasone (FLONASE) 50 MCG/ACT nasal spray Place 2 sprays into both nostrils daily. 16 g 6  . furosemide (LASIX) 40 MG tablet Take 1 tablet (40 mg total) by mouth in the morning and at bedtime. 180 tablet 1  . guaiFENesin (MUCINEX) 600 MG 12 hr tablet Take 600 mg by mouth 2 (two) times daily as needed for cough.    . losartan (COZAAR) 100 MG tablet Take 100 mg by mouth daily.    . methocarbamol (ROBAXIN) 500 MG tablet Take 1 tablet (500 mg total) by mouth 2 (two) times daily as needed for muscle spasms. 30 tablet 0  . metoprolol succinate (TOPROL-XL) 25 MG 24 hr tablet Take 0.5 tablets (12.5 mg total) by mouth daily. 45 tablet 1  . Multiple Vitamins-Minerals (CENTRUM SILVER PO) Take 1 tablet by mouth daily.    . pravastatin (PRAVACHOL) 40 MG tablet Take 1 tablet (40 mg total) by mouth daily. 90 tablet 3  . spironolactone (ALDACTONE) 25 MG tablet Take 1 tablet (25 mg total) by mouth daily. 90 tablet 1   No facility-administered medications prior to visit.    Allergies  Allergen Reactions  . Ibuprofen Hypertension    Patient stopped taking on his own due to his high blood pressure.  . Tylenol [Acetaminophen]     Patient stopped taking this due to concerns about taking it with hypertension.  Denied any negative effects     ROS Review of Systems   Objective:    Physical Exam  BP 128/82 (BP Location: Right Arm, Patient Position: Sitting, Cuff Size: Large)   Pulse 83   Temp (!) 97.3 F (36.3 C) (Temporal)   Resp 17    Ht 6\' 1"  (1.854 m)   Wt (!) 331 lb 12.8 oz (150.5 kg)   SpO2 98%   BMI 43.78 kg/m  Wt Readings from Last 3 Encounters:  08/14/19 (!) 331 lb 12.8 oz (  150.5 kg)  08/02/19 (!) 330 lb 3.2 oz (149.8 kg)  07/05/19 (!) 324 lb (147 kg)   Physical Exam  Constitutional: Oriented to person, place, and time. Appears well-developed and well-nourished.  HENT:  Head: Normocephalic and atraumatic.  Eyes: Conjunctivae and EOM are normal.  Pulmonary/Chest: Effort normal  Neurological: Is alert and oriented to person, place, and time.  Skin: Skin is warm. Capillary refill takes less than 2 seconds.   Right shoulder with difficulty with external rotation, tenderness in the [facility] joint noted No crepitus  Patient body habitus makes it difficulty to appreciate effusion due to his obesity Patient cannot extend past 45 degrees    CLINICAL DATA:  Right shoulder pain  EXAM: RIGHT SHOULDER - 2+ VIEW  COMPARISON:  None.  FINDINGS: There is no evidence of fracture or dislocation. Moderate glenohumeral arthrosis. Widening of the joint space suggests effusion. Mild acromioclavicular arthrosis. Soft tissues are unremarkable.  IMPRESSION: 1.  No fracture or dislocation of the right shoulder.  2. Moderate glenohumeral arthrosis. Widening of the joint space suggests glenohumeral joint effusion.  3.  Mild acromioclavicular arthrosis.   Electronically Signed   By: Eddie Candle M.D.   On: 08/14/2019 09:46  Health Maintenance Due  Topic Date Due  . OPHTHALMOLOGY EXAM  Never done  . COVID-19 Vaccine (1) Never done    There are no preventive care reminders to display for this patient.  No results found for: TSH Lab Results  Component Value Date   WBC 4.5 (A) 06/06/2019   HGB 9.7 (A) 06/06/2019   HCT 30.6 06/06/2019   MCV 72.3 (A) 06/06/2019   PLT 206 05/05/2019   Lab Results  Component Value Date   NA 139 06/06/2019   K 4.8 06/06/2019   CO2 24 06/06/2019   GLUCOSE 109 (H)  06/06/2019   BUN 28 (H) 06/06/2019   CREATININE 2.45 (H) 06/06/2019   BILITOT 0.3 06/06/2019   ALKPHOS 57 06/06/2019   AST 16 06/06/2019   ALT 10 06/06/2019   PROT 6.7 06/06/2019   ALBUMIN 3.9 06/06/2019   CALCIUM 9.4 06/06/2019   ANIONGAP 10 04/28/2019   GFR 66.34 07/07/2013   Lab Results  Component Value Date   CHOL 198 06/06/2019   Lab Results  Component Value Date   HDL 47 06/06/2019   Lab Results  Component Value Date   LDLCALC 129 (H) 06/06/2019   Lab Results  Component Value Date   TRIG 124 06/06/2019   Lab Results  Component Value Date   CHOLHDL 4.2 06/06/2019   Lab Results  Component Value Date   HGBA1C 6.0 (A) 06/06/2019      Assessment & Plan:   Problem List Items Addressed This Visit    None    Visit Diagnoses    Acute pain of right shoulder    -  Primary   Relevant Medications   triamcinolone acetonide (KENALOG-40) injection 60 mg   lidocaine HCl (PF) (XYLOCAINE) 2 % injection 2 mL   Other Relevant Orders   DG Shoulder Right (Completed)     Since patient cannot take NSAIDs steroid injection performed Advised to monitor his glucose at home   Meds ordered this encounter  Medications  . triamcinolone acetonide (KENALOG-40) injection 60 mg  . lidocaine HCl (PF) (XYLOCAINE) 2 % injection 2 mL    Follow-up: No follow-ups on file.    Forrest Moron, MD

## 2019-08-14 NOTE — Progress Notes (Signed)
Procedure Note  Reviewed right shoulder xray with patient and based on history and physical exam patient has adhesive capsulitis.  After reviewing the procedure and getting written consent a time out was performed.  Area cleaned and prepped with betadine. Usine PainEase spray for topical anesthesia, 60mg  of kenalog with 2cc lidocaine 2% was injected into the [facility] joint from the anterior approach.  Patient tolerated procedure well. After visit care was reviewed.

## 2019-08-16 DIAGNOSIS — I130 Hypertensive heart and chronic kidney disease with heart failure and stage 1 through stage 4 chronic kidney disease, or unspecified chronic kidney disease: Secondary | ICD-10-CM | POA: Diagnosis not present

## 2019-08-16 DIAGNOSIS — N189 Chronic kidney disease, unspecified: Secondary | ICD-10-CM | POA: Diagnosis not present

## 2019-08-16 DIAGNOSIS — I509 Heart failure, unspecified: Secondary | ICD-10-CM | POA: Diagnosis not present

## 2019-08-16 DIAGNOSIS — I4892 Unspecified atrial flutter: Secondary | ICD-10-CM | POA: Diagnosis not present

## 2019-08-16 DIAGNOSIS — J309 Allergic rhinitis, unspecified: Secondary | ICD-10-CM | POA: Diagnosis not present

## 2019-08-16 DIAGNOSIS — E1122 Type 2 diabetes mellitus with diabetic chronic kidney disease: Secondary | ICD-10-CM | POA: Diagnosis not present

## 2019-08-16 DIAGNOSIS — M109 Gout, unspecified: Secondary | ICD-10-CM | POA: Diagnosis not present

## 2019-08-16 DIAGNOSIS — E785 Hyperlipidemia, unspecified: Secondary | ICD-10-CM | POA: Diagnosis not present

## 2019-08-16 DIAGNOSIS — Z6841 Body Mass Index (BMI) 40.0 and over, adult: Secondary | ICD-10-CM | POA: Diagnosis not present

## 2019-08-20 IMAGING — DX LEFT ELBOW - COMPLETE 3+ VIEW
4 series · 4 of 4 positions shown · non-contrast
Comparison: None.

CLINICAL DATA: LATERAL epicondylitis involving the LEFT elbow.
Decreased range of motion.

EXAM:
LEFT ELBOW - COMPLETE 3+ VIEW

[elbow ap]
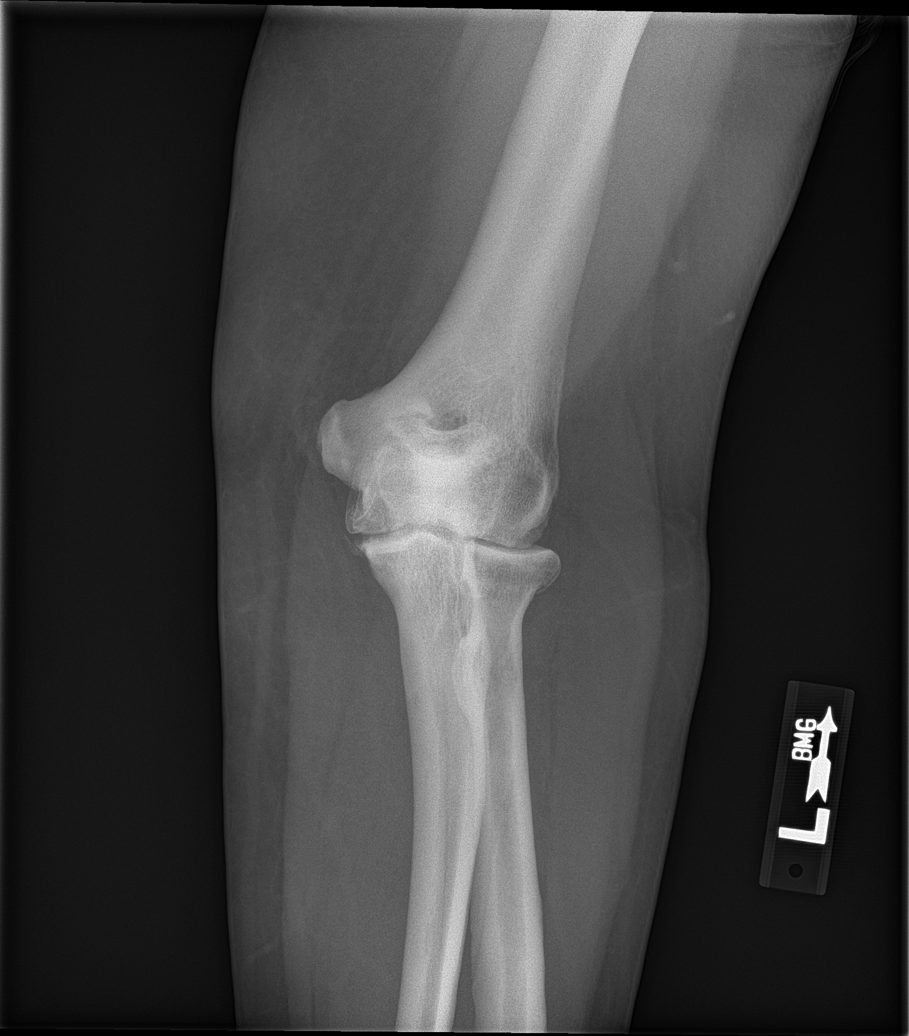

[elbow obl (1 of 2)]
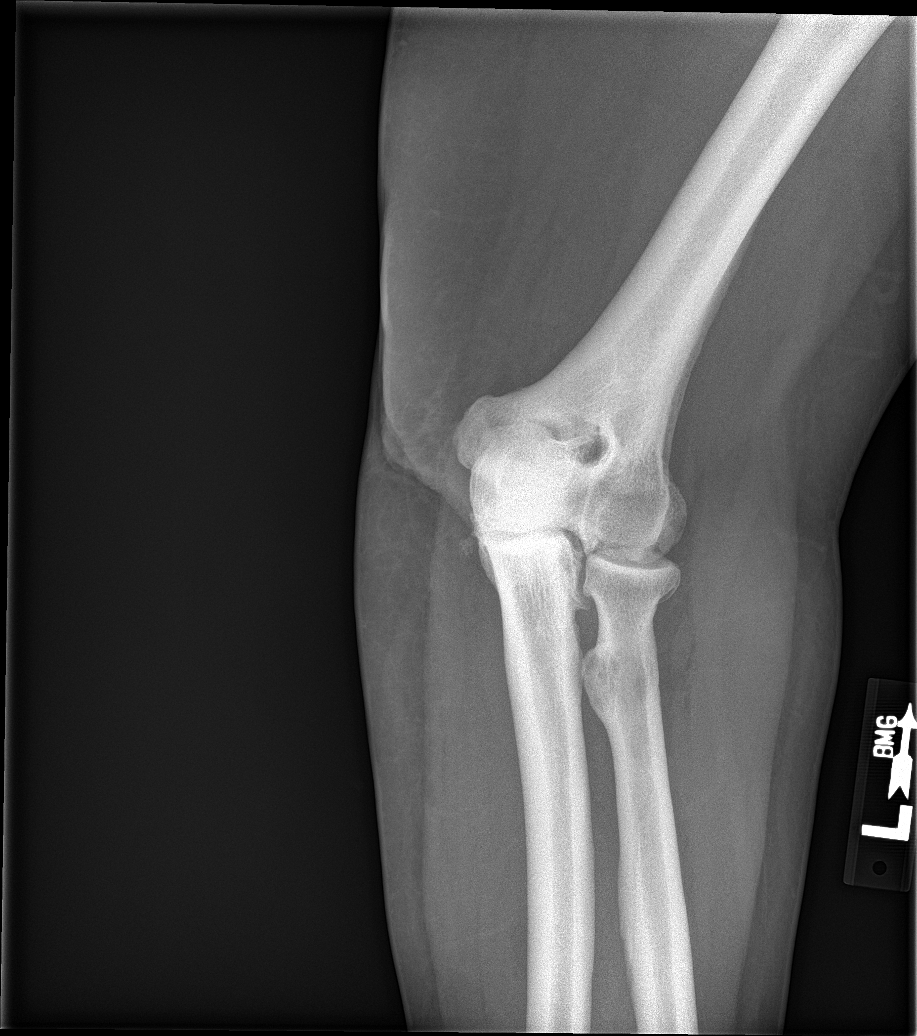

[elbow obl (2 of 2)]
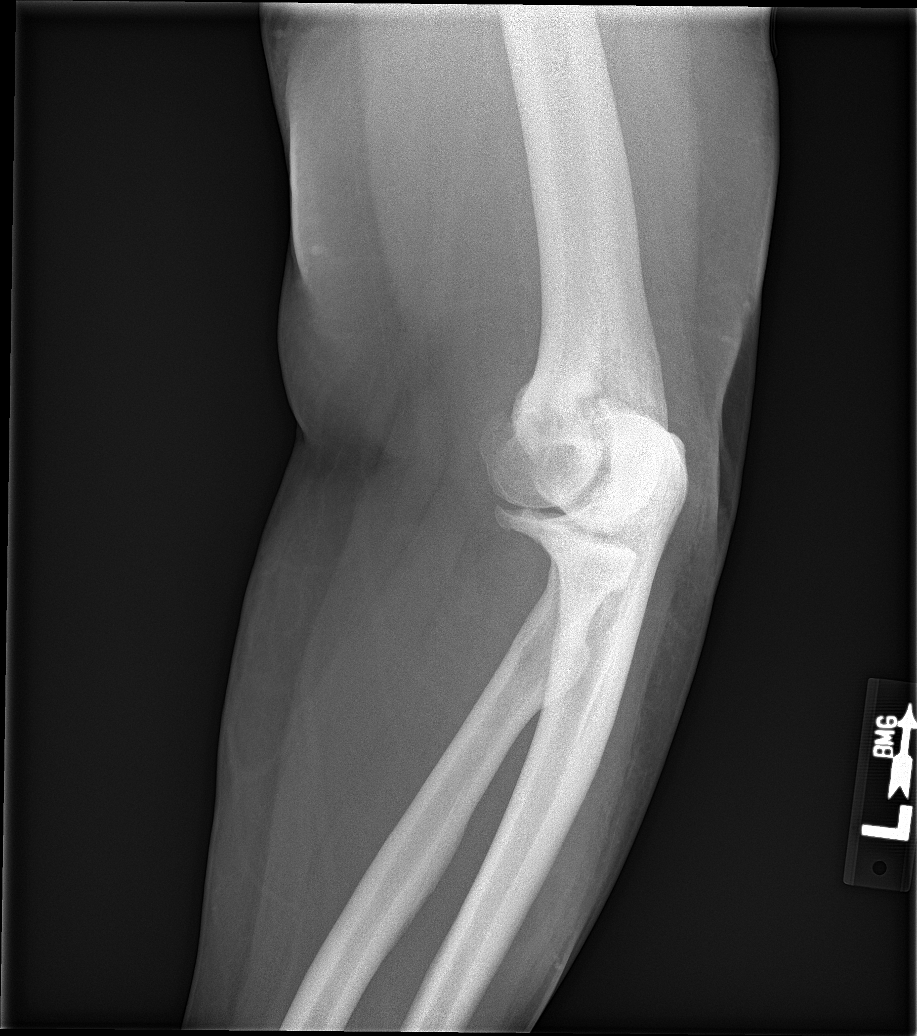

[elbow lat]
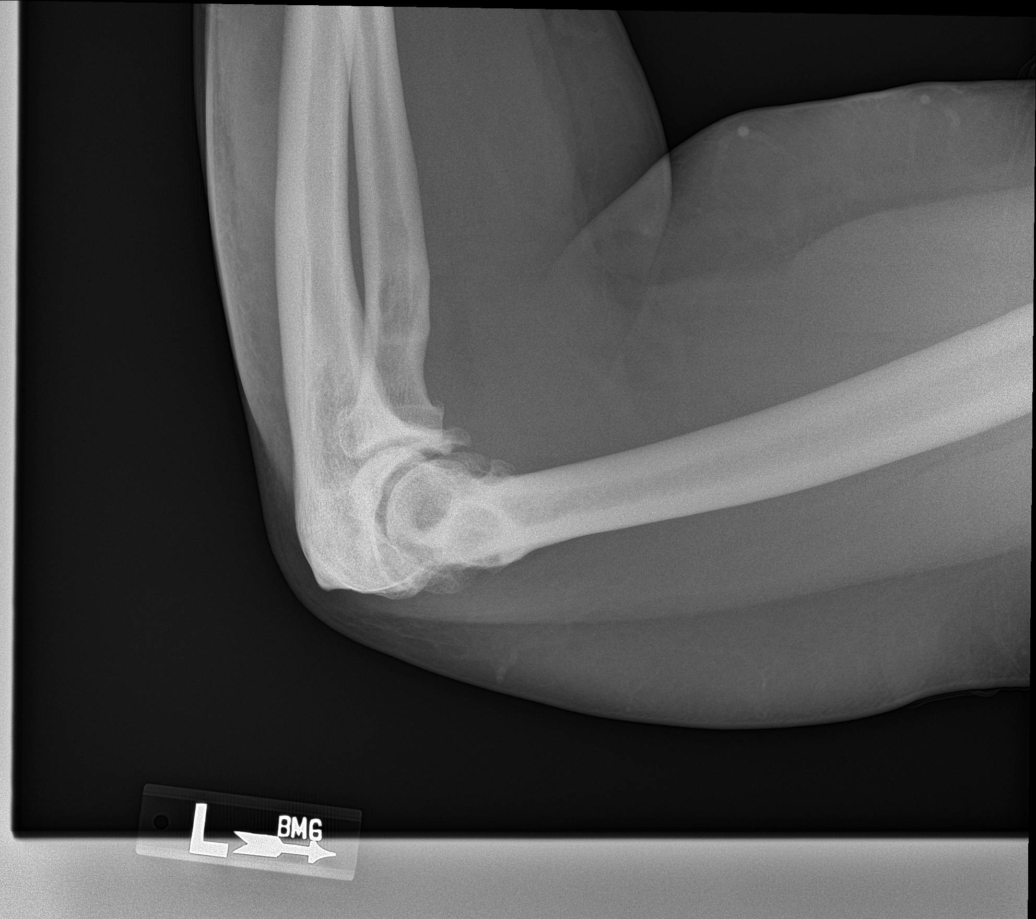

[4 of 4 positions shown; findings below may reference images not displayed]

FINDINGS: No evidence of acute, subacute or healed fractures. Severe narrowing
of the humeroulnar and humeral radial joints with associated
hypertrophic spurring and likely calcified intra-articular loose
bodies. Bone mineral density well-preserved. No POSTERIOR fat pad to
confirm a joint effusion or hemarthrosis.
IMPRESSION: Severe osteoarthritis. No acute or subacute osseous abnormality.

## 2019-09-01 ENCOUNTER — Other Ambulatory Visit: Payer: Self-pay

## 2019-09-01 ENCOUNTER — Other Ambulatory Visit: Payer: Self-pay | Admitting: Family Medicine

## 2019-09-01 ENCOUNTER — Telehealth: Payer: Self-pay | Admitting: Family Medicine

## 2019-09-01 DIAGNOSIS — E1122 Type 2 diabetes mellitus with diabetic chronic kidney disease: Secondary | ICD-10-CM

## 2019-09-01 DIAGNOSIS — Z794 Long term (current) use of insulin: Secondary | ICD-10-CM

## 2019-09-01 MED ORDER — APIXABAN 5 MG PO TABS: 5.0000 mg | ORAL_TABLET | Freq: Two times a day (BID) | ORAL | 0 refills | Status: DC

## 2019-09-01 NOTE — Telephone Encounter (Signed)
Patient is requesting a refill of the following medications: Requested Prescriptions    No prescriptions requested or ordered in this encounter    Date of patient request: 09/01/19  Last office visit: 08/14/19 Date of last refill:06/30/19 Last refill amount: 120 Follow up time period per chart: No future refill for [place] as of yet.

## 2019-09-01 NOTE — Telephone Encounter (Signed)
Requested medication (s) are due for refill today -yes  Requested medication (s) are on the active medication list -yes  Future visit scheduled -no  Last refill: 07/01/19  Notes to clinic: Nolon Rod' patient- has seen Orland Mustard once in past. Labs do not pass protocol  Requested Prescriptions  Pending Prescriptions Disp Refills   ELIQUIS 5 MG TABS tablet [Pharmacy Med Name: Eliquis 5 MG Oral Tablet] 120 tablet 0    Sig: Take 1 tablet by mouth twice daily      Hematology:  Anticoagulants Failed - 09/01/2019  9:11 AM      Failed - HGB in normal range and within 360 days    Hemoglobin  Date Value Ref Range Status  06/06/2019 9.7 (A) 11 - 14.6 g/dL Final  05/05/2019 8.6 (L) 13.0 - 17.7 g/dL Final          Failed - Cr in normal range and within 360 days    Creatinine, Ser  Date Value Ref Range Status  06/06/2019 2.45 (H) 0.76 - 1.27 mg/dL Final          Passed - PLT in normal range and within 360 days    Platelets  Date Value Ref Range Status  05/05/2019 206 150 - 450 x10E3/uL Final   Platelet Count, POC  Date Value Ref Range Status  06/06/2019 321 142 - 424 K/uL Final          Passed - HCT in normal range and within 360 days    HCT, POC  Date Value Ref Range Status  06/06/2019 30.6 29 - 41 % Final   Hematocrit  Date Value Ref Range Status  05/05/2019 28.3 (L) 37.5 - 51.0 % Final          Passed - Valid encounter within last 12 months    Recent Outpatient Visits           2 weeks ago Acute pain of right shoulder   Primary Care at [facility], Zoe A, MD   1 month ago Type 2 diabetes mellitus with stage 3a chronic kidney disease, with long-term current use of insulin ([place])   Primary Care at [facility], Arlie Solomons, MD   1 month ago Strain of lumbar region, initial encounter   [facility] at [facility], [place], NP   1 month ago Acute renal failure superimposed on stage 3b chronic kidney disease, unspecified acute renal failure type ([place])   Primary Care at  [facility], Zoe A, MD   2 months ago Type 2 diabetes mellitus with stage 3a chronic kidney disease, with long-term current use of insulin ([place])   Primary Care at [facility], Arlie Solomons, MD                  Requested Prescriptions  Pending Prescriptions Disp Refills   ELIQUIS 5 MG TABS tablet [Pharmacy Med Name: Eliquis 5 MG Oral Tablet] 120 tablet 0    Sig: Take 1 tablet by mouth twice daily      Hematology:  Anticoagulants Failed - 09/01/2019  9:11 AM      Failed - HGB in normal range and within 360 days    Hemoglobin  Date Value Ref Range Status  06/06/2019 9.7 (A) 11 - 14.6 g/dL Final  05/05/2019 8.6 (L) 13.0 - 17.7 g/dL Final          Failed - Cr in normal range and within 360 days    Creatinine, Ser  Date Value Ref Range Status  06/06/2019  2.45 (H) 0.76 - 1.27 mg/dL Final          Passed - PLT in normal range and within 360 days    Platelets  Date Value Ref Range Status  05/05/2019 206 150 - 450 x10E3/uL Final   Platelet Count, POC  Date Value Ref Range Status  06/06/2019 321 142 - 424 K/uL Final          Passed - HCT in normal range and within 360 days    HCT, POC  Date Value Ref Range Status  06/06/2019 30.6 29 - 41 % Final   Hematocrit  Date Value Ref Range Status  05/05/2019 28.3 (L) 37.5 - 51.0 % Final          Passed - Valid encounter within last 12 months    Recent Outpatient Visits           2 weeks ago Acute pain of right shoulder   Primary Care at [facility], Zoe A, MD   1 month ago Type 2 diabetes mellitus with stage 3a chronic kidney disease, with long-term current use of insulin ([place])   Primary Care at [facility], Arlie Solomons, MD   1 month ago Strain of lumbar region, initial encounter   [facility] at [facility], [place], NP   1 month ago Acute renal failure superimposed on stage 3b chronic kidney disease, unspecified acute renal failure type [facility])   Primary Care at [facility], Zoe A, MD   2 months ago Type  2 diabetes mellitus with stage 3a chronic kidney disease, with long-term current use of insulin ([place])   Primary Care at [facility], Arlie Solomons, MD

## 2019-09-01 NOTE — Telephone Encounter (Signed)
Patient is requesting a refill of the following medications: Requested Prescriptions   Pending Prescriptions Disp Refills  . ELIQUIS 5 MG TABS tablet [Pharmacy Med Name: Eliquis 5 MG Oral Tablet] 120 tablet 0    Sig: Take 1 tablet by mouth twice daily    Date of patient request: 09/01/19          Last office visit: 08/14/19 Date of last refill:06/30/19 Last refill amount: 120 Follow up time period per chart: No future refill for [place] as of yet.

## 2019-09-01 NOTE — Telephone Encounter (Signed)
Medication Refill - Medication: apixaban (ELIQUIS) 5 MG TABS tablet    Has the patient contacted their pharmacy? Yes.   (Agent: If no, request that the patient contact the pharmacy for the refill.) (Agent: If yes, when and what did the pharmacy advise?)  Preferred Pharmacy (with phone number or street name): [place], [place].  [address] [place] [address]  Phone:  [phone] Fax:  [phone]   Agent: Please be advised that RX refills may take up to 3 business days. We ask that you follow-up with your pharmacy.

## 2019-09-04 ENCOUNTER — Ambulatory Visit: Payer: Self-pay | Admitting: Pharmacist

## 2019-09-04 ENCOUNTER — Other Ambulatory Visit: Payer: Self-pay | Admitting: Family Medicine

## 2019-09-04 DIAGNOSIS — I5032 Chronic diastolic (congestive) heart failure: Secondary | ICD-10-CM

## 2019-09-04 NOTE — Telephone Encounter (Signed)
Change of pharmacy Requested Prescriptions  Pending Prescriptions Disp Refills  . amLODipine (NORVASC) 10 MG tablet [Pharmacy Med Name: AMLODIPINE 10MG  TAB 10 Tablet] 30 tablet 0    Sig: TAKE 1 TABLET BY MOUTH ONCE DAILY     Cardiovascular:  Calcium Channel Blockers Passed - 09/04/2019  2:05 PM      Passed - Last BP in normal range    BP Readings from Last 1 Encounters:  08/14/19 128/82         Passed - Valid encounter within last 6 months    Recent Outpatient Visits          3 weeks ago Acute pain of right shoulder   Primary Care at [facility], Zoe A, MD   1 month ago Type 2 diabetes mellitus with stage 3a chronic kidney disease, with long-term current use of insulin ([place])   Primary Care at [facility], Arlie Solomons, MD   1 month ago Strain of lumbar region, initial encounter   [facility] at [facility], Delfino Lovett, NP   2 months ago Acute renal failure superimposed on stage 3b chronic kidney disease, unspecified acute renal failure type ([place])   Primary Care at [facility], Zoe A, MD   3 months ago Type 2 diabetes mellitus with stage 3a chronic kidney disease, with long-term current use of insulin ([place])   Primary Care at [facility], [place] A, MD             . allopurinol (ZYLOPRIM) 100 MG tablet [Pharmacy Med Name: ALLOPURINOL 100 MG TABS 100 Tablet] 15 tablet 0    Sig: TAKE 1/2 TABLET BY MOUTH ONCE DAILY     Endocrinology:  Gout Agents Failed - 09/04/2019  2:05 PM      Failed - Uric Acid in normal range and within 360 days    Uric Acid  Date Value Ref Range Status  03/08/2018 8.0 3.7 - 8.6 mg/dL Final    Comment:               Therapeutic target for gout patients: <6.0         Failed - Cr in normal range and within 360 days    Creatinine, Ser  Date Value Ref Range Status  06/06/2019 2.45 (H) 0.76 - 1.27 mg/dL Final         Passed - Valid encounter within last 12 months    Recent Outpatient Visits          3 weeks ago Acute pain of right shoulder   Primary Care at [facility], Zoe A, MD   1 month ago Type 2 diabetes mellitus with stage 3a chronic kidney disease, with long-term current use of insulin ([place])   Primary Care at [facility], Zoe A, MD   1 month ago Strain of lumbar region, initial encounter   [facility] at [facility], [place], NP   2 months ago Acute renal failure superimposed on stage 3b chronic kidney disease, unspecified acute renal failure type [facility])   Primary Care at [facility], Zoe A, MD   3 months ago Type 2 diabetes mellitus with stage 3a chronic kidney disease, with long-term current use of insulin ([place])   Primary Care at [facility], [place] A, MD             . metoprolol succinate (TOPROL-XL) 25 MG 24 hr tablet [Pharmacy Med Name: METOPROLOL SUCC ER 25MG  25 Tablet] 30 tablet 0    Sig: TAKE 1 TABLET BY MOUTH  ONCE DAILY     Cardiovascular:  Beta Blockers Passed - 09/04/2019  2:05 PM      Passed - Last BP in normal range    BP Readings from Last 1 Encounters:  08/14/19 128/82         Passed - Last Heart Rate in normal range    Pulse Readings from Last 1 Encounters:  08/14/19 83         Passed - Valid encounter within last 6 months    Recent Outpatient Visits          3 weeks ago Acute pain of right shoulder   Primary Care at [facility], Zoe A, MD   1 month ago Type 2 diabetes mellitus with stage 3a chronic kidney disease, with long-term current use of insulin ([place])   Primary Care at [facility], Arlie Solomons, MD   1 month ago Strain of lumbar region, initial encounter   [facility] at [facility], Delfino Lovett, NP   2 months ago Acute renal failure superimposed on stage 3b chronic kidney disease, unspecified acute renal failure type [facility])   Primary Care at [facility], Zoe A, MD   3 months ago Type 2 diabetes mellitus with stage 3a chronic kidney disease, with long-term current use of insulin [facility])   Primary Care at [facility], Arlie Solomons, MD             '

## 2019-09-04 NOTE — Telephone Encounter (Signed)
Change of pharmacy Requested Prescriptions  Pending Prescriptions Disp Refills  . amLODipine (NORVASC) 10 MG tablet [Pharmacy Med Name: AMLODIPINE 10MG  TAB 10 Tablet] 30 tablet 0    Sig: TAKE 1 TABLET BY MOUTH ONCE DAILY     Cardiovascular:  Calcium Channel Blockers Passed - 09/04/2019  2:05 PM      Passed - Last BP in normal range    BP Readings from Last 1 Encounters:  08/14/19 128/82         Passed - Valid encounter within last 6 months    Recent Outpatient Visits          3 weeks ago Acute pain of right shoulder   Primary Care at [facility], Zoe A, MD   1 month ago Type 2 diabetes mellitus with stage 3a chronic kidney disease, with long-term current use of insulin ([place])   Primary Care at [facility], Arlie Solomons, MD   1 month ago Strain of lumbar region, initial encounter   [facility] at [facility], Delfino Lovett, NP   2 months ago Acute renal failure superimposed on stage 3b chronic kidney disease, unspecified acute renal failure type ([place])   Primary Care at [facility], Zoe A, MD   3 months ago Type 2 diabetes mellitus with stage 3a chronic kidney disease, with long-term current use of insulin ([place])   Primary Care at [facility] [place] A, MD             . allopurinol (ZYLOPRIM) 100 MG tablet [Pharmacy Med Name: ALLOPURINOL 100 MG TABS 100 Tablet] 15 tablet     Sig: TAKE 1/2 TABLET BY MOUTH ONCE DAILY     Endocrinology:  Gout Agents Failed - 09/04/2019  2:05 PM      Failed - Uric Acid in normal range and within 360 days    Uric Acid  Date Value Ref Range Status  03/08/2018 8.0 3.7 - 8.6 mg/dL Final    Comment:               Therapeutic target for gout patients: <6.0         Failed - Cr in normal range and within 360 days    Creatinine, Ser  Date Value Ref Range Status  06/06/2019 2.45 (H) 0.76 - 1.27 mg/dL Final         Passed - Valid encounter within last 12 months    Recent Outpatient Visits          3 weeks ago Acute pain of right shoulder   Primary Care at [facility], Zoe A, MD   1 month ago Type 2 diabetes mellitus with stage 3a chronic kidney disease, with long-term current use of insulin ([place])   Primary Care at [facility], Zoe A, MD   1 month ago Strain of lumbar region, initial encounter   [facility] at [facility], [place], NP   2 months ago Acute renal failure superimposed on stage 3b chronic kidney disease, unspecified acute renal failure type [facility])   Primary Care at [facility], Zoe A, MD   3 months ago Type 2 diabetes mellitus with stage 3a chronic kidney disease, with long-term current use of insulin ([place])   Primary Care at [facility] [place] [facility], MD             . metoprolol succinate (TOPROL-XL) 25 MG 24 hr tablet [Pharmacy Med Name: METOPROLOL SUCC ER 25MG  25 Tablet] 30 tablet 0    Sig: TAKE 1 TABLET BY MOUTH  ONCE DAILY     Cardiovascular:  Beta Blockers Passed - 09/04/2019  2:05 PM      Passed - Last BP in normal range    BP Readings from Last 1 Encounters:  08/14/19 128/82         Passed - Last Heart Rate in normal range    Pulse Readings from Last 1 Encounters:  08/14/19 83         Passed - Valid encounter within last 6 months    Recent Outpatient Visits          3 weeks ago Acute pain of right shoulder   Primary Care at [facility], Zoe A, MD   1 month ago Type 2 diabetes mellitus with stage 3a chronic kidney disease, with long-term current use of insulin ([place])   Primary Care at [facility], Arlie Solomons, MD   1 month ago Strain of lumbar region, initial encounter   [facility] at [facility], Delfino Lovett, NP   2 months ago Acute renal failure superimposed on stage 3b chronic kidney disease, unspecified acute renal failure type [facility])   Primary Care at [facility], Zoe A, MD   3 months ago Type 2 diabetes mellitus with stage 3a chronic kidney disease, with long-term current use of insulin ([place])   Primary Care at [facility], Arlie Solomons, MD

## 2019-09-04 NOTE — Telephone Encounter (Signed)
Requested medication (s) are due for refill today: --  Requested medication (s) are on the active medication list: historical medication  Last refill:  06/26/19  Future visit scheduled: no  Notes to clinic:  historical med and provider   Requested Prescriptions  Pending Prescriptions Disp Refills   allopurinol (ZYLOPRIM) 100 MG tablet [Pharmacy Med Name: ALLOPURINOL 100 MG TABS 100 Tablet] 15 tablet     Sig: TAKE 1/2 TABLET BY MOUTH ONCE DAILY      Endocrinology:  Gout Agents Failed - 09/04/2019  2:05 PM      Failed - Uric Acid in normal range and within 360 days    Uric Acid  Date Value Ref Range Status  03/08/2018 8.0 3.7 - 8.6 mg/dL Final    Comment:               Therapeutic target for gout patients: <6.0          Failed - Cr in normal range and within 360 days    Creatinine, Ser  Date Value Ref Range Status  06/06/2019 2.45 (H) 0.76 - 1.27 mg/dL Final          Passed - Valid encounter within last 12 months    Recent Outpatient Visits           3 weeks ago Acute pain of right shoulder   Primary Care at [facility], Zoe A, MD   1 month ago Type 2 diabetes mellitus with stage 3a chronic kidney disease, with long-term current use of insulin ([place])   Primary Care at [facility], Zoe A, MD   1 month ago Strain of lumbar region, initial encounter   Primary Care at [facility], [place], NP   2 months ago Acute renal failure superimposed on stage 3b chronic kidney disease, unspecified acute renal failure type ([place])   Primary Care at [facility], Zoe A, MD   3 months ago Type 2 diabetes mellitus with stage 3a chronic kidney disease, with long-term current use of insulin ([place])   Primary Care at [facility], Arlie Solomons, MD               Signed Prescriptions Disp Refills   amLODipine (NORVASC) 10 MG tablet 30 tablet 0    Sig: TAKE 1 TABLET BY MOUTH ONCE DAILY      Cardiovascular:  Calcium Channel Blockers Passed - 09/04/2019  2:05 PM      Passed -  Last BP in normal range    BP Readings from Last 1 Encounters:  08/14/19 128/82          Passed - Valid encounter within last 6 months    Recent Outpatient Visits           3 weeks ago Acute pain of right shoulder   Primary Care at [facility], Zoe A, MD   1 month ago Type 2 diabetes mellitus with stage 3a chronic kidney disease, with long-term current use of insulin ([place])   Primary Care at [facility], Zoe A, MD   1 month ago Strain of lumbar region, initial encounter   [facility] at [facility], [place], NP   2 months ago Acute renal failure superimposed on stage 3b chronic kidney disease, unspecified acute renal failure type [facility])   Primary Care at [facility], Zoe A, MD   3 months ago Type 2 diabetes mellitus with stage 3a chronic kidney disease, with long-term current use of insulin ([place])   Primary Care at [facility]  Delia Chimes A, MD                metoprolol succinate (TOPROL-XL) 25 MG 24 hr tablet 30 tablet 0    Sig: TAKE 1 TABLET BY MOUTH ONCE DAILY      Cardiovascular:  Beta Blockers Passed - 09/04/2019  2:05 PM      Passed - Last BP in normal range    BP Readings from Last 1 Encounters:  08/14/19 128/82          Passed - Last Heart Rate in normal range    Pulse Readings from Last 1 Encounters:  08/14/19 83          Passed - Valid encounter within last 6 months    Recent Outpatient Visits           3 weeks ago Acute pain of right shoulder   Primary Care at [facility], Zoe A, MD   1 month ago Type 2 diabetes mellitus with stage 3a chronic kidney disease, with long-term current use of insulin ([place])   Primary Care at [facility], Arlie Solomons, MD   1 month ago Strain of lumbar region, initial encounter   [facility] at [facility], Delfino Lovett, NP   2 months ago Acute renal failure superimposed on stage 3b chronic kidney disease, unspecified acute renal failure type [facility])   Primary Care at [facility], Zoe A, MD   3 months ago  Type 2 diabetes mellitus with stage 3a chronic kidney disease, with long-term current use of insulin ([place])   [facility] MD

## 2019-09-06 ENCOUNTER — Ambulatory Visit: Payer: Self-pay | Admitting: Pharmacist

## 2019-09-13 ENCOUNTER — Other Ambulatory Visit: Payer: Self-pay

## 2019-09-13 DIAGNOSIS — E1122 Type 2 diabetes mellitus with diabetic chronic kidney disease: Secondary | ICD-10-CM

## 2019-09-13 MED ORDER — ACCU-CHEK GUIDE ME W/DEVICE KIT: 1.0000 | PACK | Freq: Three times a day (TID) | 3 refills | Status: DC

## 2019-09-13 NOTE — Telephone Encounter (Signed)
Fax received from [facility] requesting Rx w/ specific brand and instructions .

## 2019-09-14 ENCOUNTER — Other Ambulatory Visit: Payer: Self-pay | Admitting: Family Medicine

## 2019-09-14 DIAGNOSIS — N1831 Chronic kidney disease, stage 3a: Secondary | ICD-10-CM

## 2019-09-14 DIAGNOSIS — E1122 Type 2 diabetes mellitus with diabetic chronic kidney disease: Secondary | ICD-10-CM

## 2019-09-14 NOTE — Telephone Encounter (Signed)
Requested medication (s) are due for refill today: yes  Requested medication (s) are on the active medication list: yes  Last refill:   Future visit scheduled:   Notes to clinic:  insurance requests alternate    Requested Prescriptions  Pending Prescriptions Disp Refills   Blood Glucose Monitoring Suppl (ACCU-CHEK GUIDE ME) w/Device KIT [Pharmacy Med Name: ACCU-CHEK GUIDE ME   KIT]  3    Sig: USE METER TO [place] MEAL(S)      Endocrinology: Diabetes - Testing Supplies Passed - 09/14/2019  6:28 PM      Passed - Valid encounter within last 12 months    Recent Outpatient Visits           1 month ago Acute pain of right shoulder   Primary Care at [facility], Zoe A, MD   1 month ago Type 2 diabetes mellitus with stage 3a chronic kidney disease, with long-term current use of insulin ([place])   Primary Care at [place], MD   2 months ago Strain of lumbar region, initial encounter   [facility] at [facility], [place], NP   2 months ago Acute renal failure superimposed on stage 3b chronic kidney disease, unspecified acute renal failure type [facility])   Primary Care at [facility], Zoe A, MD   3 months ago Type 2 diabetes mellitus with stage 3a chronic kidney disease, with long-term current use of insulin ([place])   Primary Care at [facility], Arlie Solomons, MD

## 2019-09-15 MED ORDER — ACCU-CHEK GUIDE ME W/DEVICE KIT: PACK | 3 refills | Status: AC

## 2019-09-15 NOTE — Addendum Note (Signed)
Addended by: Dimple Nanas on: 09/15/2019 12:43 PM   Modules accepted: Orders

## 2019-09-27 ENCOUNTER — Institutional Professional Consult (permissible substitution): Payer: Medicare HMO | Admitting: Pulmonary Disease

## 2019-10-04 ENCOUNTER — Other Ambulatory Visit: Payer: Self-pay | Admitting: Family Medicine

## 2019-10-04 DIAGNOSIS — I5032 Chronic diastolic (congestive) heart failure: Secondary | ICD-10-CM

## 2019-10-04 NOTE — Telephone Encounter (Signed)
Requested medication (s) are due for refill today: yes  Requested medication (s) are on the active medication list: yes  Last refill:  09/04/19 by Dr Pamella Pert  #30  0 refill  Future visit scheduled: No  Notes to clinic:  last OV with Dr Nolon Rod 08/02/19.  Last fill was 30 days. Is patient to have OV with Dr Pamella Pert?    Requested Prescriptions  Pending Prescriptions Disp Refills   metoprolol succinate (TOPROL-XL) 25 MG 24 hr tablet [Pharmacy Med Name: METOPROLOL SUCC ER 25MG  25 Tablet] 30 tablet 0    Sig: TAKE (1) TABLET BY MOUTH ONCE DAILY      Cardiovascular:  Beta Blockers Passed - 10/04/2019 12:39 PM      Passed - Last BP in normal range    BP Readings from Last 1 Encounters:  08/14/19 128/82          Passed - Last Heart Rate in normal range    Pulse Readings from Last 1 Encounters:  08/14/19 83          Passed - Valid encounter within last 6 months    Recent Outpatient Visits           1 month ago Acute pain of right shoulder   Primary Care at [facility], Zoe A, MD   2 months ago Type 2 diabetes mellitus with stage 3a chronic kidney disease, with long-term current use of insulin ([place])   Primary Care at [facility], Zoe A, MD   2 months ago Strain of lumbar region, initial encounter   [facility] at [facility], Delfino Lovett, NP   3 months ago Acute renal failure superimposed on stage 3b chronic kidney disease, unspecified acute renal failure type ([place])   Primary Care at [facility], Zoe A, MD   4 months ago Type 2 diabetes mellitus with stage 3a chronic kidney disease, with long-term current use of insulin ([place])   Primary Care at [place], MD                amLODipine (NORVASC) 10 MG tablet [Pharmacy Med Name: AMLODIPINE 10MG  TAB 10 Tablet] 30 tablet 0    Sig: TAKE (1) TABLET BY MOUTH ONCE DAILY      Cardiovascular:  Calcium Channel Blockers Passed - 10/04/2019 12:39 PM      Passed - Last BP in normal range    BP Readings from  Last 1 Encounters:  08/14/19 128/82          Passed - Valid encounter within last 6 months    Recent Outpatient Visits           1 month ago Acute pain of right shoulder   Primary Care at [place], MD   2 months ago Type 2 diabetes mellitus with stage 3a chronic kidney disease, with long-term current use of insulin ([place])   Primary Care at [place], MD   2 months ago Strain of lumbar region, initial encounter   Primary [facility] at [facility], [place], NP   3 months ago Acute renal failure superimposed on stage 3b chronic kidney disease, unspecified acute renal failure type [facility])   Primary Care at [facility], Zoe A, MD   4 months ago Type 2 diabetes mellitus with stage 3a chronic kidney disease, with long-term current use of insulin ([place])   Primary Care at [facility], Arlie Solomons, MD

## 2019-10-12 DIAGNOSIS — R609 Edema, unspecified: Secondary | ICD-10-CM | POA: Diagnosis not present

## 2019-10-12 DIAGNOSIS — E875 Hyperkalemia: Secondary | ICD-10-CM | POA: Diagnosis not present

## 2019-10-12 DIAGNOSIS — I480 Paroxysmal atrial fibrillation: Secondary | ICD-10-CM | POA: Diagnosis not present

## 2019-10-12 DIAGNOSIS — N1832 Chronic kidney disease, stage 3b: Secondary | ICD-10-CM | POA: Diagnosis not present

## 2019-10-12 DIAGNOSIS — I129 Hypertensive chronic kidney disease with stage 1 through stage 4 chronic kidney disease, or unspecified chronic kidney disease: Secondary | ICD-10-CM | POA: Diagnosis not present

## 2019-10-12 DIAGNOSIS — D649 Anemia, unspecified: Secondary | ICD-10-CM | POA: Diagnosis not present

## 2019-10-12 DIAGNOSIS — D631 Anemia in chronic kidney disease: Secondary | ICD-10-CM | POA: Diagnosis not present

## 2019-10-13 ENCOUNTER — Other Ambulatory Visit: Payer: Self-pay | Admitting: Family Medicine

## 2019-10-13 DIAGNOSIS — E1122 Type 2 diabetes mellitus with diabetic chronic kidney disease: Secondary | ICD-10-CM

## 2019-10-13 DIAGNOSIS — Z794 Long term (current) use of insulin: Secondary | ICD-10-CM

## 2019-10-13 MED ORDER — APIXABAN 5 MG PO TABS: 5.0000 mg | ORAL_TABLET | Freq: Two times a day (BID) | ORAL | 0 refills | Status: DC

## 2019-10-13 NOTE — Telephone Encounter (Signed)
Medication Refill - Medication: apixaban (ELIQUIS) 5 MG TABS tablet   Has the patient contacted their pharmacy? Yes.   (Agent: If no, request that the patient contact the pharmacy for the refill.) (Agent: If yes, when and what did the pharmacy advise?)  Preferred Pharmacy (with phone number or street name):  [place], [place] Phone:  [phone]  Fax:  [phone]       Agent: Please be advised that RX refills may take up to 3 business days. We ask that you follow-up with your pharmacy.

## 2019-10-25 ENCOUNTER — Telehealth: Payer: Self-pay | Admitting: Registered Nurse

## 2019-10-25 DIAGNOSIS — E1122 Type 2 diabetes mellitus with diabetic chronic kidney disease: Secondary | ICD-10-CM

## 2019-10-25 DIAGNOSIS — N1831 Chronic kidney disease, stage 3a: Secondary | ICD-10-CM

## 2019-10-25 NOTE — Telephone Encounter (Signed)
What is the name of the medication? apixaban (ELIQUIS) 5 MG TABS tablet [[id number]]    Have you contacted your pharmacy to request a refill? We sent this script on 10/13/19. They are saying they didn't get it.   Which pharmacy would you like this sent to? Pharmacy  [place], [place]  [address] [place] [address]  Phone:  [phone] Fax:  [phone]       Patient notified that their request is being sent to the clinical staff for review and that they should receive a call once it is complete. If they do not receive a call within 72 hours they can check with their pharmacy or our office.

## 2019-10-25 NOTE — Telephone Encounter (Signed)
I have called [facility] to gather more information. Pharm tech stated that pt med has been received and is on hold due to the high copay of [phone]. They have been trying to contact the pt to approve the copay and there was no answer.  I have attempted to reach pt as well with no success.   Thanks

## 2019-11-03 ENCOUNTER — Other Ambulatory Visit: Payer: Self-pay | Admitting: Emergency Medicine

## 2019-11-03 DIAGNOSIS — I5032 Chronic diastolic (congestive) heart failure: Secondary | ICD-10-CM

## 2019-11-03 DIAGNOSIS — I110 Hypertensive heart disease with heart failure: Secondary | ICD-10-CM

## 2019-11-03 MED ORDER — SPIRONOLACTONE 25 MG PO TABS: 25.0000 mg | ORAL_TABLET | Freq: Every day | ORAL | 1 refills | Status: DC

## 2019-11-08 ENCOUNTER — Other Ambulatory Visit: Payer: Self-pay | Admitting: Family Medicine

## 2019-11-08 DIAGNOSIS — I5032 Chronic diastolic (congestive) heart failure: Secondary | ICD-10-CM

## 2019-11-14 ENCOUNTER — Ambulatory Visit (INDEPENDENT_AMBULATORY_CARE_PROVIDER_SITE_OTHER): Payer: Medicare HMO | Admitting: Registered Nurse

## 2019-11-14 ENCOUNTER — Other Ambulatory Visit: Payer: Self-pay

## 2019-11-14 ENCOUNTER — Encounter: Payer: Self-pay | Admitting: Registered Nurse

## 2019-11-14 VITALS — BP 156/96 | HR 82 | Temp 97.3°F | Resp 17 | Ht 73.0 in | Wt 337.2 lb

## 2019-11-14 DIAGNOSIS — E1121 Type 2 diabetes mellitus with diabetic nephropathy: Secondary | ICD-10-CM | POA: Diagnosis not present

## 2019-11-14 DIAGNOSIS — E1122 Type 2 diabetes mellitus with diabetic chronic kidney disease: Secondary | ICD-10-CM

## 2019-11-14 DIAGNOSIS — Z794 Long term (current) use of insulin: Secondary | ICD-10-CM

## 2019-11-14 DIAGNOSIS — R609 Edema, unspecified: Secondary | ICD-10-CM | POA: Diagnosis not present

## 2019-11-14 DIAGNOSIS — I110 Hypertensive heart disease with heart failure: Secondary | ICD-10-CM | POA: Diagnosis not present

## 2019-11-14 DIAGNOSIS — I5032 Chronic diastolic (congestive) heart failure: Secondary | ICD-10-CM

## 2019-11-14 DIAGNOSIS — N1831 Chronic kidney disease, stage 3a: Secondary | ICD-10-CM

## 2019-11-14 LAB — POCT GLYCOSYLATED HEMOGLOBIN (HGB A1C): Hemoglobin A1C: 6.2 % — AB (ref 4.0–5.6)

## 2019-11-14 LAB — GLUCOSE, POCT (MANUAL RESULT ENTRY): POC Glucose: 145 mg/dl — AB (ref 70–99)

## 2019-11-14 MED ORDER — FUROSEMIDE 40 MG PO TABS: 40.0000 mg | ORAL_TABLET | Freq: Two times a day (BID) | ORAL | 1 refills | Status: DC

## 2019-11-14 MED ORDER — SPIRONOLACTONE 25 MG PO TABS: 25.0000 mg | ORAL_TABLET | Freq: Every day | ORAL | 1 refills | Status: DC

## 2019-11-14 MED ORDER — APIXABAN 5 MG PO TABS: 5.0000 mg | ORAL_TABLET | Freq: Two times a day (BID) | ORAL | 0 refills | Status: DC

## 2019-11-14 NOTE — Progress Notes (Signed)
df

## 2019-11-14 NOTE — Progress Notes (Signed)
Established Patient Office Visit  Subjective:  Patient ID: Nathan Grant, male    DOB: [birth year]  Age: 74 y.o. MRN: [id number]  CC:  Chief Complaint  Patient presents with  . Follow-up    patient states he is following up because he has pain in the shoulder he was prescribed a steroid but he doesn't like it becasue when the medicine is gone he still have pain. Per patient he also would like his legs looked at because they are both very swollen and dark.  . Medication Refill    on pended medications    HPI Nathan Grant presents for 3 mo follow up  After hospitalization in early 2021 was noted to have extensive DVTs. Was placed on eliquis - has since dc'd because of the cost. No further symptoms of clots at this time - no further symptoms of COVID or other concerns. Wants to make sure the clots have resolved but feels that God has a plan for him to be around.  No other concerns at this time. Formerly a patient of Dr. Nolon Rod - hoping to stay within our office.   Past Medical History:  Diagnosis Date  . Atrial flutter ([place])    s/p cardioversion 06/2010  . Bradycardia    a. Accelerated junctional & sinus bradycardia during 01/2013 adm.  . Chronic headaches   . Degenerative joint disease     End-stage degenerative joint disease, left knee  . Erectile dysfunction   . HTN (hypertension)   . Hypertensive heart disease    a. Admit for CP 01/2013 felt due to this. Cath with normal coronary anatomy.  . Microcytic anemia  03/22/2001  . Morbid obesity ([place])   . NSVT (nonsustained ventricular tachycardia) ([place])    a. During 01/2013 adm.  . Obesity   . Osteoarthritis   . Tendonitis   . Tobacco chew use     History of chewing tobacco usage.   . Urinary tract infection     Pseudomonas urinary tract infection    Past Surgical History:  Procedure Laterality Date  . CARDIOVERSION  07/01/2010  . LEFT HEART CATHETERIZATION WITH CORONARY ANGIOGRAM N/A 01/16/2013   Procedure: LEFT  HEART CATHETERIZATION WITH CORONARY ANGIOGRAM;  Surgeon: Peter M Martinique, MD;  Location: [facility] CATH LAB;  Service: Cardiovascular;  Laterality: N/A;  . TOTAL KNEE ARTHROPLASTY  May 2009   Bilateral  . VASECTOMY      Family History  Problem Relation Age of Onset  . Alcohol abuse Brother   . Hypertension Brother   . Hypertension Mother     Social History   Socioeconomic History  . Marital status: Married    Spouse name: Not on file  . Number of children: 6  . Years of education: Not on file  . Highest education level: Not on file  Occupational History  . Occupation: Day Care    Employer: RETIRED  Tobacco Use  . Smoking status: Never Smoker  . Smokeless tobacco: Current User    Types: Chew  Substance and Sexual Activity  . Alcohol use: No  . Drug use: No  . Sexual activity: Yes  Other Topics Concern  . Not on file  Social History Narrative  . Not on file   Social Determinants of Health   Financial Resource Strain:   . Difficulty of Paying Living Expenses:   Food Insecurity:   . Worried About Charity fundraiser in the Last Year:   . [place] in the  Last Year:   Transportation Needs:   . Film/video editor (Medical):   [name] Lack of Transportation (Non-Medical):   Physical Activity:   . Days of Exercise per Week:   . Minutes of Exercise per Session:   Stress:   . Feeling of Stress :   Social Connections:   . Frequency of Communication with Friends and Family:   . Frequency of Social Gatherings with Friends and Family:   . Attends Religious Services:   . Active Member of Clubs or Organizations:   . Attends Archivist Meetings:   [name] Marital Status:   Intimate Partner Violence:   . Fear of Current or Ex-Partner:   . Emotionally Abused:   [name] Physically Abused:   . Sexually Abused:     Outpatient Medications Prior to Visit  Medication Sig Dispense Refill  . albuterol (VENTOLIN HFA) 108 (90 Base) MCG/ACT inhaler Inhale 2 puffs into the lungs every  6 (six) hours as needed for wheezing. 18 g 0  . amLODipine (NORVASC) 10 MG tablet TAKE ONE (1) TABLET BY MOUTH ONCE DAILY 90 tablet 0  . Ascorbic Acid (VITAMIN C) 500 MG CAPS Take 500 mg by mouth daily.     [name] b complex vitamins tablet Take 1 tablet by mouth daily.    . blood glucose meter kit and supplies Dispense based on patient and insurance preference. Use up to four times daily as directed. (FOR ICD-10 E10.9, E11.9). 1 each 0  . Blood Glucose Monitoring Suppl (ACCU-CHEK GUIDE ME) w/Device KIT USE METER TO CHECK GLUCOSE THREE TIMES DAILY BEFORE MEAL(S) 1 kit 3  . cholecalciferol (VITAMIN D3) 25 MCG (1000 UT) tablet Take 1,000 Units by mouth daily.    . ferrous sulfate 324 (65 Fe) MG TBEC Take 1 tablet (325 mg total) by mouth 2 (two) times daily. 180 tablet 3  . fluticasone (FLONASE) 50 MCG/ACT nasal spray Place 2 sprays into both nostrils daily. 16 g 6  . guaiFENesin (MUCINEX) 600 MG 12 hr tablet Take 600 mg by mouth 2 (two) times daily as needed for cough.    . losartan (COZAAR) 100 MG tablet Take 100 mg by mouth daily.    . methocarbamol (ROBAXIN) 500 MG tablet Take 1 tablet (500 mg total) by mouth 2 (two) times daily as needed for muscle spasms. 30 tablet 0  . metoprolol succinate (TOPROL-XL) 25 MG 24 hr tablet TAKE ONE (1) TABLET BY MOUTH ONCE DAILY 90 tablet 0  . Multiple Vitamins-Minerals (CENTRUM SILVER PO) Take 1 tablet by mouth daily.    . pravastatin (PRAVACHOL) 40 MG tablet Take 1 tablet (40 mg total) by mouth daily. 90 tablet 3  . apixaban (ELIQUIS) 5 MG TABS tablet Take 1 tablet (5 mg total) by mouth 2 (two) times daily. 120 tablet 0  . furosemide (LASIX) 40 MG tablet Take 1 tablet (40 mg total) by mouth in the morning and at bedtime. 180 tablet 1  . spironolactone (ALDACTONE) 25 MG tablet Take 1 tablet (25 mg total) by mouth daily. 90 tablet 1  . allopurinol (ZYLOPRIM) 100 MG tablet TAKE 1/2 TABLET BY MOUTH ONCE DAILY (Patient not taking: Reported on 11/14/2019) 15 tablet 0   No  facility-administered medications prior to visit.    Allergies  Allergen Reactions  . Ibuprofen Hypertension    Patient stopped taking on his own due to his high blood pressure.  . Tylenol [Acetaminophen]     Patient stopped taking this due to concerns about taking it  with hypertension.  Denied any negative effects     ROS Review of Systems  Constitutional: Negative.   HENT: Negative.   Eyes: Negative.   Respiratory: Negative.   Cardiovascular: Positive for leg swelling. Negative for chest pain and palpitations.  Gastrointestinal: Negative.   Endocrine: Negative.   Genitourinary: Negative.   Musculoskeletal: Negative.   Skin: Negative.   Allergic/Immunologic: Negative.   Neurological: Negative.   Hematological: Negative.   Psychiatric/Behavioral: Negative.   All other systems reviewed and are negative.     Objective:    Physical Exam Vitals and nursing note reviewed.  Constitutional:      General: He is not in acute distress.    Appearance: Normal appearance. He is obese. He is not ill-appearing, toxic-appearing or diaphoretic.  Cardiovascular:     Rate and Rhythm: Normal rate and regular rhythm.  Skin:    Capillary Refill: Capillary refill takes more than 3 seconds.  Neurological:     General: No focal deficit present.     Mental Status: He is alert and oriented to person, place, and time. Mental status is at baseline.  Psychiatric:        Mood and Affect: Mood normal.        Behavior: Behavior normal.        Thought Content: Thought content normal.        Judgment: Judgment normal.     BP (!) 156/96   Pulse 82   Temp (!) 97.3 F (36.3 C) (Temporal)   Resp 17   Ht 6' 1" (1.854 m)   Wt (!) 337 lb 3.2 oz (153 kg)   SpO2 97%   BMI 44.49 kg/m  Wt Readings from Last 3 Encounters:  11/14/19 (!) 337 lb 3.2 oz (153 kg)  08/14/19 (!) 331 lb 12.8 oz (150.5 kg)  08/02/19 (!) 330 lb 3.2 oz (149.8 kg)     Health Maintenance Due  Topic Date Due  .  OPHTHALMOLOGY EXAM  Never done  . INFLUENZA VACCINE  11/12/2019    There are no preventive care reminders to display for this patient.  No results found for: TSH Lab Results  Component Value Date   WBC 4.5 (A) 06/06/2019   HGB 9.7 (A) 06/06/2019   HCT 30.6 06/06/2019   MCV 72.3 (A) 06/06/2019   PLT 206 05/05/2019   Lab Results  Component Value Date   NA 139 06/06/2019   K 4.8 06/06/2019   CO2 24 06/06/2019   GLUCOSE 109 (H) 06/06/2019   BUN 28 (H) 06/06/2019   CREATININE 2.45 (H) 06/06/2019   BILITOT 0.3 06/06/2019   ALKPHOS 57 06/06/2019   AST 16 06/06/2019   ALT 10 06/06/2019   PROT 6.7 06/06/2019   ALBUMIN 3.9 06/06/2019   CALCIUM 9.4 06/06/2019   ANIONGAP 10 04/28/2019   GFR 66.34 07/07/2013   Lab Results  Component Value Date   CHOL 198 06/06/2019   Lab Results  Component Value Date   HDL 47 06/06/2019   Lab Results  Component Value Date   LDLCALC 129 (H) 06/06/2019   Lab Results  Component Value Date   TRIG 124 06/06/2019   Lab Results  Component Value Date   CHOLHDL 4.2 06/06/2019   Lab Results  Component Value Date   HGBA1C 6.2 (A) 11/14/2019      Assessment & Plan:   Problem List Items Addressed This Visit      Cardiovascular and Mediastinum   Hypertensive heart disease (Chronic)   Relevant  Medications   apixaban (ELIQUIS) 5 MG TABS tablet   furosemide (LASIX) 40 MG tablet   spironolactone (ALDACTONE) 25 MG tablet   Other Relevant Orders   Lipid panel   POCT glycosylated hemoglobin (Hb A1C) (Completed)     Endocrine   Type 2 diabetes mellitus with stage 3 chronic kidney disease, with long-term current use of insulin (HCC)   Relevant Medications   apixaban (ELIQUIS) 5 MG TABS tablet   Other Relevant Orders   CMP14+EGFR   POCT glucose (manual entry) (Completed)   Lipid panel   POCT glycosylated hemoglobin (Hb A1C) (Completed)      Meds ordered this encounter  Medications  . apixaban (ELIQUIS) 5 MG TABS tablet    Sig: Take  1 tablet (5 mg total) by mouth 2 (two) times daily.    Dispense:  120 tablet    Refill:  0  . furosemide (LASIX) 40 MG tablet    Sig: Take 1 tablet (40 mg total) by mouth in the morning and at bedtime.    Dispense:  180 tablet    Refill:  1  . spironolactone (ALDACTONE) 25 MG tablet    Sig: Take 1 tablet (25 mg total) by mouth daily.    Dispense:  90 tablet    Refill:  1    Follow-up: No follow-ups on file.   PLAN  Ideally patient will resume apixaban but realistically patient is unwilling to pay price for this - will refer to vascular for further assessment and to help discuss plan for this complicated patient  Refill apixaban, furosemide, and spironolactone  Labs collected, will follow up as warranted  Patient encouraged to call clinic with any questions, comments, or concerns.  Maximiano Coss, NP

## 2019-11-14 NOTE — Patient Instructions (Signed)
° ° ° °  If you have lab work done today you will be contacted with your lab results within the next 2 weeks.  If you have not heard from us then please contact us. The fastest way to get your results is to register for My Chart. ° ° °IF you received an x-ray today, you will receive an invoice from [facility]. Please contact [facility] at [phone] with questions or concerns regarding your invoice.  ° °IF you received labwork today, you will receive an invoice from [facility]. Please contact [facility] at [phone] with questions or concerns regarding your invoice.  ° °Our billing staff will not be able to assist you with questions regarding bills from these companies. ° °You will be contacted with the lab results as soon as they are available. The fastest way to get your results is to activate your My Chart account. Instructions are located on the last page of this paperwork. If you have not heard from us regarding the results in 2 weeks, please contact this office. °  ° ° ° °

## 2019-11-15 ENCOUNTER — Encounter: Payer: Self-pay | Admitting: Registered Nurse

## 2019-11-15 LAB — CMP14+EGFR
ALT: 12 IU/L (ref 0–44)
AST: 15 IU/L (ref 0–40)
Albumin/Globulin Ratio: 1.8 (ref 1.2–2.2)
Albumin: 4.1 g/dL (ref 3.7–4.7)
Alkaline Phosphatase: 46 IU/L — ABNORMAL LOW (ref 48–121)
BUN/Creatinine Ratio: 17 (ref 10–24)
BUN: 45 mg/dL — ABNORMAL HIGH (ref 8–27)
Bilirubin Total: 0.4 mg/dL (ref 0.0–1.2)
CO2: 25 mmol/L (ref 20–29)
Calcium: 9.1 mg/dL (ref 8.6–10.2)
Chloride: 102 mmol/L (ref 96–106)
Creatinine, Ser: 2.67 mg/dL — ABNORMAL HIGH (ref 0.76–1.27)
GFR calc Af Amer: 26 mL/min/{1.73_m2} — ABNORMAL LOW (ref >59)
GFR calc non Af Amer: 22 mL/min/{1.73_m2} — ABNORMAL LOW (ref >59)
Globulin, Total: 2.3 g/dL (ref 1.5–4.5)
Glucose: 135 mg/dL — ABNORMAL HIGH (ref 65–99)
Potassium: 4.9 mmol/L (ref 3.5–5.2)
Sodium: 139 mmol/L (ref 134–144)
Total Protein: 6.4 g/dL (ref 6.0–8.5)

## 2019-11-15 LAB — LIPID PANEL
Chol/HDL Ratio: 3.5 ratio (ref 0.0–5.0)
Cholesterol, Total: 157 mg/dL (ref 100–199)
HDL: 45 mg/dL (ref >39)
LDL Chol Calc (NIH): 92 mg/dL (ref 0–99)
Triglycerides: 113 mg/dL (ref 0–149)
VLDL Cholesterol Cal: 20 mg/dL (ref 5–40)

## 2020-01-01 ENCOUNTER — Ambulatory Visit: Payer: Medicare HMO | Admitting: Registered Nurse

## 2020-01-01 ENCOUNTER — Other Ambulatory Visit: Payer: Self-pay

## 2020-01-24 ENCOUNTER — Other Ambulatory Visit: Payer: Self-pay | Admitting: Registered Nurse

## 2020-01-24 DIAGNOSIS — R609 Edema, unspecified: Secondary | ICD-10-CM | POA: Diagnosis not present

## 2020-01-24 DIAGNOSIS — I82409 Acute embolism and thrombosis of unspecified deep veins of unspecified lower extremity: Secondary | ICD-10-CM | POA: Diagnosis not present

## 2020-01-24 DIAGNOSIS — I129 Hypertensive chronic kidney disease with stage 1 through stage 4 chronic kidney disease, or unspecified chronic kidney disease: Secondary | ICD-10-CM | POA: Diagnosis not present

## 2020-01-24 DIAGNOSIS — D649 Anemia, unspecified: Secondary | ICD-10-CM | POA: Diagnosis not present

## 2020-01-24 DIAGNOSIS — N1832 Chronic kidney disease, stage 3b: Secondary | ICD-10-CM | POA: Diagnosis not present

## 2020-01-24 DIAGNOSIS — I480 Paroxysmal atrial fibrillation: Secondary | ICD-10-CM | POA: Diagnosis not present

## 2020-01-24 DIAGNOSIS — E875 Hyperkalemia: Secondary | ICD-10-CM | POA: Diagnosis not present

## 2020-02-01 ENCOUNTER — Other Ambulatory Visit: Payer: Self-pay

## 2020-02-01 ENCOUNTER — Encounter: Payer: Self-pay | Admitting: Family Medicine

## 2020-02-01 ENCOUNTER — Ambulatory Visit (INDEPENDENT_AMBULATORY_CARE_PROVIDER_SITE_OTHER): Payer: Medicare HMO | Admitting: Family Medicine

## 2020-02-01 VITALS — BP 153/99 | HR 70 | Temp 97.3°F | Ht 73.0 in | Wt 334.0 lb

## 2020-02-01 DIAGNOSIS — M19011 Primary osteoarthritis, right shoulder: Secondary | ICD-10-CM

## 2020-02-01 DIAGNOSIS — Z8616 Personal history of COVID-19: Secondary | ICD-10-CM

## 2020-02-01 DIAGNOSIS — Z760 Encounter for issue of repeat prescription: Secondary | ICD-10-CM | POA: Diagnosis not present

## 2020-02-01 DIAGNOSIS — I5032 Chronic diastolic (congestive) heart failure: Secondary | ICD-10-CM

## 2020-02-01 DIAGNOSIS — Z86711 Personal history of pulmonary embolism: Secondary | ICD-10-CM

## 2020-02-01 MED ORDER — METOPROLOL SUCCINATE ER 25 MG PO TB24: ORAL_TABLET | ORAL | 3 refills | Status: AC

## 2020-02-01 MED ORDER — AMLODIPINE BESYLATE 10 MG PO TABS: ORAL_TABLET | ORAL | 3 refills | Status: AC

## 2020-02-01 MED ORDER — PRAVASTATIN SODIUM 40 MG PO TABS: 40.0000 mg | ORAL_TABLET | Freq: Every day | ORAL | 3 refills | Status: AC

## 2020-02-01 MED ORDER — LOSARTAN POTASSIUM 100 MG PO TABS: 100.0000 mg | ORAL_TABLET | Freq: Every day | ORAL | 3 refills | Status: AC

## 2020-02-01 NOTE — Progress Notes (Signed)
Patient ID: Nathan Grant, male    DOB: [birth year]  Age: 74 y.o. MRN: [id number]  Chief Complaint  Patient presents with  . Pain    right shoulder pain, given shot by [facility] and the pain went away. Does not want the shot again. Taking tylenol for the pain, does not work    Subjective:   Patient continues to have a lot of trouble with pain in his right shoulder.  The shot he got this spring did not give relief more than a few days.  He cannot lift his right arm or use it much.  He is left-handed fortunately.  He does work around the house.  He would like referral to an orthopedic doctor.  He had Covid earlier this year and a pulmonary embolus following that.  He was placed on anticoagulant medication, but is no longer taking it because he could not afford it.  He wanted to know if there is something more affordable.  He has not had any symptoms.  He has never had clots other times in his life.  He needs medicines refilled.  Current allergies, medications, problem list, past/family and social histories reviewed.  Objective:  BP (!) 153/99   Pulse 70   Temp (!) 97.3 F (36.3 C)   Ht 6\' 1"  (1.854 m)   Wt (!) 334 lb (151.5 kg)   SpO2 100%   BMI 44.07 kg/m   Obese gentleman in no acute distress.  Blood pressure elevation is noted.  Very limited range of motion of the right arm.  Assessment & Plan:   Assessment: 1. Primary osteoarthritis of right shoulder   2. Chronic diastolic CHF (congestive heart failure) ([place])   3. Encounter for medication refill   4. Morbid obesity ([place])   5. Personal history of COVID-19   6. History of pulmonary embolism       Plan: See instructions.  Discussed anticoagulant therapy options with him.  He is in agreement.  I think since his blood clot was caused by the Covid and it has been over 6 months that he can probably be fairly safe without further therapy.  However he understands the need to get checked if any symptoms at all.  Orders Placed  This Encounter  Procedures  . Ambulatory referral to Orthopedic Surgery    Referral Priority:   Routine    Referral Type:   Surgical    Referral Reason:   Specialty Services Required    Referred to Provider:   Justice Britain, MD    Requested Specialty:   Orthopedic Surgery    Number of Visits Requested:   1    Meds ordered this encounter  Medications  . amLODipine (NORVASC) 10 MG tablet    Sig: TAKE ONE (1) TABLET BY MOUTH ONCE DAILY    Dispense:  90 tablet    Refill:  3  . losartan (COZAAR) 100 MG tablet    Sig: Take 1 tablet (100 mg total) by mouth daily.    Dispense:  90 tablet    Refill:  3  . metoprolol succinate (TOPROL-XL) 25 MG 24 hr tablet    Sig: TAKE ONE (1) TABLET BY MOUTH ONCE DAILY    Dispense:  90 tablet    Refill:  3  . pravastatin (PRAVACHOL) 40 MG tablet    Sig: Take 1 tablet (40 mg total) by mouth daily.    Dispense:  90 tablet    Refill:  3  Patient Instructions    Referral is being made to orthopedic specialist.  I have requested Dr. Justice Britain who does a lot of shoulders.  Refills have been sent in on your medicines  After discussion with you regarding anticoagulant therapy, it has been well over 6 months since your Covid induced pulmonary embolus.  I think that you probably can continue on your 81 mg aspirin and not go back with one of the expensive blood thinner medicines.  Your blood pressure is a little bit elevated today, but you are in a good deal of pain.  I would request that you get some mild to recheck her blood pressure in a couple of weeks, because if it continues elevated we will need to be increasing some medications.  Return in about 4 months or as needed.   If you have lab work done today you will be contacted with your lab results within the next 2 weeks.  If you have not heard from [place] then please contact us. The fastest way to get your results is to register for My Chart.   IF you received an x-ray today, you will  receive an invoice from [facility]. Please contact [facility] at [phone] with questions or concerns regarding your invoice.   IF you received labwork today, you will receive an invoice from [facility]. Please contact [facility] at [phone] with questions or concerns regarding your invoice.   Our billing staff will not be able to assist you with questions regarding bills from these companies.  You will be contacted with the lab results as soon as they are available. The fastest way to get your results is to activate your My Chart account. Instructions are located on the last page of this paperwork. If you have not heard from [place] regarding the results in 2 weeks, please contact this office.        Return in about 4 months (around 06/03/2020) for Hypertension follow-up.   Ruben Reason, MD 02/01/2020

## 2020-02-01 NOTE — Patient Instructions (Addendum)
  Referral is being made to orthopedic specialist.  I have requested Dr. Justice Britain who does a lot of shoulders.  Refills have been sent in on your medicines  After discussion with you regarding anticoagulant therapy, it has been well over 6 months since your Covid induced pulmonary embolus.  I think that you probably can continue on your 81 mg aspirin and not go back with one of the expensive blood thinner medicines.  Your blood pressure is a little bit elevated today, but you are in a good deal of pain.  I would request that you get some mild to recheck her blood pressure in a couple of weeks, because if it continues elevated we will need to be increasing some medications.  Return in about 4 months or as needed.   If you have lab work done today you will be contacted with your lab results within the next 2 weeks.  If you have not heard from [place] then please contact us. The fastest way to get your results is to register for My Chart.   IF you received an x-ray today, you will receive an invoice from [facility]. Please contact [facility] at [phone] with questions or concerns regarding your invoice.   IF you received labwork today, you will receive an invoice from [facility]. Please contact [facility] at [phone] with questions or concerns regarding your invoice.   Our billing staff will not be able to assist you with questions regarding bills from these companies.  You will be contacted with the lab results as soon as they are available. The fastest way to get your results is to activate your My Chart account. Instructions are located on the last page of this paperwork. If you have not heard from [place] regarding the results in 2 weeks, please contact this office.

## 2020-02-21 DIAGNOSIS — M25511 Pain in right shoulder: Secondary | ICD-10-CM | POA: Diagnosis not present

## 2020-03-01 ENCOUNTER — Other Ambulatory Visit: Payer: Self-pay

## 2020-03-01 ENCOUNTER — Ambulatory Visit (INDEPENDENT_AMBULATORY_CARE_PROVIDER_SITE_OTHER): Payer: Medicare HMO | Admitting: Registered Nurse

## 2020-03-01 ENCOUNTER — Telehealth: Payer: Self-pay

## 2020-03-01 ENCOUNTER — Encounter: Payer: Self-pay | Admitting: Registered Nurse

## 2020-03-01 VITALS — BP 188/118 | HR 83 | Temp 97.9°F | Resp 18 | Ht 73.0 in | Wt 338.8 lb

## 2020-03-01 DIAGNOSIS — M25511 Pain in right shoulder: Secondary | ICD-10-CM

## 2020-03-01 MED ORDER — OXYCODONE HCL 5 MG PO CAPS: 5.0000 mg | ORAL_CAPSULE | Freq: Every evening | ORAL | 0 refills | Status: DC | PRN

## 2020-03-01 MED ORDER — LIDOCAINE 5 % EX PTCH: 1.0000 | MEDICATED_PATCH | CUTANEOUS | 0 refills | Status: DC

## 2020-03-01 NOTE — Telephone Encounter (Signed)
Pt would like to know if he needs to keep his TOC appt. Forgot to ask provider during his 03/01/20 visit. Please advise

## 2020-03-01 NOTE — Patient Instructions (Signed)
° ° ° °  If you have lab work done today you will be contacted with your lab results within the next 2 weeks.  If you have not heard from us then please contact us. The fastest way to get your results is to register for My Chart. ° ° °IF you received an x-ray today, you will receive an invoice from [facility]. Please contact [facility] at [phone] with questions or concerns regarding your invoice.  ° °IF you received labwork today, you will receive an invoice from [facility]. Please contact [facility] at [phone] with questions or concerns regarding your invoice.  ° °Our billing staff will not be able to assist you with questions regarding bills from these companies. ° °You will be contacted with the lab results as soon as they are available. The fastest way to get your results is to activate your My Chart account. Instructions are located on the last page of this paperwork. If you have not heard from us regarding the results in 2 weeks, please contact this office. °  ° ° ° °

## 2020-03-04 NOTE — Telephone Encounter (Signed)
Left message for pt he needs to keep appt last OV was acute visit for shoulder not TOC

## 2020-03-05 ENCOUNTER — Ambulatory Visit: Payer: Self-pay | Admitting: *Deleted

## 2020-03-05 NOTE — Telephone Encounter (Signed)
Pt wants to know if it is ok to take "total beets" for his bp. :)   Per research- no medication interactions- although if the purpose of this is to lower BP and you are taking BP medication- you may want to monitor BP to see if there are changes. Patient states he does not have cuff and want to know if his insurance would cover BP cuff to monitor BP at home. Told patient would send concern to office for PCP review and comment on supplement.  Reason for Disposition  [1] Caller has medicine question about med NOT prescribed by PCP AND [2] triager unable to answer question (e.g., compatibility with other med, storage)  Answer Assessment - Initial Assessment Questions 1. NAME of MEDICATION: "What medicine are you calling about?"     Supplement: total beets 2. QUESTION: "What is your question?" (e.g., medication refill, side effect)     Is it safe to take 3. PRESCRIBING HCP: "Who prescribed it?" Reason: if prescribed by specialist, call should be referred to that group.     PCP 4. SYMPTOMS: "Do you have any symptoms?"     n/a 5. SEVERITY: If symptoms are present, ask "Are they mild, moderate or severe?"     n/a 6. PREGNANCY:  "Is there any chance that you are pregnant?" "When was your last menstrual period?"     n/a  Protocols used: MEDICATION QUESTION CALL-A-AH

## 2020-03-18 ENCOUNTER — Ambulatory Visit (INDEPENDENT_AMBULATORY_CARE_PROVIDER_SITE_OTHER): Payer: Medicare HMO | Admitting: Registered Nurse

## 2020-03-18 ENCOUNTER — Encounter: Payer: Self-pay | Admitting: Registered Nurse

## 2020-03-18 ENCOUNTER — Other Ambulatory Visit: Payer: Self-pay

## 2020-03-18 VITALS — BP 191/117 | HR 80 | Temp 97.4°F | Resp 18 | Ht 73.0 in | Wt 331.0 lb

## 2020-03-18 DIAGNOSIS — Z7689 Persons encountering health services in other specified circumstances: Secondary | ICD-10-CM | POA: Diagnosis not present

## 2020-03-18 DIAGNOSIS — Z01818 Encounter for other preprocedural examination: Secondary | ICD-10-CM

## 2020-03-18 DIAGNOSIS — I493 Ventricular premature depolarization: Secondary | ICD-10-CM | POA: Diagnosis not present

## 2020-03-18 LAB — URINALYSIS
Bilirubin, UA: NEGATIVE
Glucose, UA: NEGATIVE
Ketones, UA: NEGATIVE
Nitrite, UA: NEGATIVE
RBC, UA: NEGATIVE
Specific Gravity, UA: 1.01 (ref 1.005–1.030)
Urobilinogen, Ur: 0.2 mg/dL (ref 0.2–1.0)
pH, UA: 6 (ref 5.0–7.5)

## 2020-03-18 NOTE — Progress Notes (Signed)
Established Patient Office Visit  Subjective:  Patient ID: Nathan Grant, male    DOB: 10/07/1945  Age: 74 y.o. MRN: [id number]  CC:  Chief Complaint  Patient presents with  . Transitions Of Care    Patient states he is here for shoulder pain. Per patient its making it hard for him to sleep and is here to discuss anything to get ready for a surgery.    HPI Nathan Grant presents for preoperative visit for R shoulder replacement  Known severe OA has been seen by Dr. Justice Britain who recommends joint replacement.  I have preoperative paperwork from that office.  Pt does have known hx of aflutter, has not been on thinners as apixaban has been prohibitively expensive. Clinically stable. No cv concerns at this time.  No other concerns. Feeling well overall. Has been some time since he's seen cardiology  Reviewed histories with patient, updated as warranted   Past Medical History:  Diagnosis Date  . Atrial flutter ([place])    s/p cardioversion 06/2010  . Bradycardia    a. Accelerated junctional & sinus bradycardia during 01/2013 adm.  . Chronic headaches   . Degenerative joint disease     End-stage degenerative joint disease, left knee  . Erectile dysfunction   . HTN (hypertension)   . Hypertensive heart disease    a. Admit for CP 01/2013 felt due to this. Cath with normal coronary anatomy.  . Microcytic anemia  03/22/2001  . Morbid obesity ([place])   . NSVT (nonsustained ventricular tachycardia) ([place])    a. During 01/2013 adm.  . Obesity   . Osteoarthritis   . Tendonitis   . Tobacco chew use     History of chewing tobacco usage.   . Urinary tract infection     Pseudomonas urinary tract infection    Past Surgical History:  Procedure Laterality Date  . CARDIOVERSION  07/01/2010  . LEFT HEART CATHETERIZATION WITH CORONARY ANGIOGRAM N/A 01/16/2013   Procedure: LEFT HEART CATHETERIZATION WITH CORONARY ANGIOGRAM;  Surgeon: Peter M Martinique, MD;  [facility] LAB;   Service: Cardiovascular;  Laterality: N/A;  . TOTAL KNEE ARTHROPLASTY  May 2009   Bilateral  . VASECTOMY      Family History  Problem Relation Age of Onset  . Alcohol abuse Brother   . Hypertension Brother   . Hypertension Mother     Social History   Socioeconomic History  . Marital status: Married    Spouse name: Not on file  . Number of children: 6  . Years of education: Not on file  . Highest education level: Not on file  Occupational History  . Occupation: Day Care    Employer: RETIRED  Tobacco Use  . Smoking status: Never Smoker  . Smokeless tobacco: Current User    Types: Chew  Substance and Sexual Activity  . Alcohol use: No  . Drug use: No  . Sexual activity: Yes  Other Topics Concern  . Not on file  Social History Narrative  . Not on file   Social Determinants of Health   Financial Resource Strain:   . Difficulty of Paying Living Expenses: Not on file  Food Insecurity:   . Worried About Charity fundraiser in the Last Year: Not on file  . Ran Out of Food in the Last Year: Not on file  Transportation Needs:   . Lack of Transportation (Medical): Not on file  . Lack of Transportation (Non-Medical): Not on file  Physical Activity:   .  Days of Exercise per Week: Not on file  . Minutes of Exercise per Session: Not on file  Stress:   . Feeling of Stress : Not on file  Social Connections:   . Frequency of Communication with Friends and Family: Not on file  . Frequency of Social Gatherings with Friends and Family: Not on file  . Attends Religious Services: Not on file  . Active Member of Clubs or Organizations: Not on file  . Attends Archivist Meetings: Not on file  . Marital Status: Not on file  Intimate Partner Violence:   . Fear of Current or Ex-Partner: Not on file  . Emotionally Abused: Not on file  . Physically Abused: Not on file  . Sexually Abused: Not on file    Outpatient Medications Prior to Visit  Medication Sig Dispense Refill   . albuterol (VENTOLIN HFA) 108 (90 Base) MCG/ACT inhaler Inhale 2 puffs into the lungs every 6 (six) hours as needed for wheezing. 18 g 0  . amLODipine (NORVASC) 10 MG tablet TAKE ONE (1) TABLET BY MOUTH ONCE DAILY 90 tablet 3  . Ascorbic Acid (VITAMIN C) 500 MG CAPS Take 500 mg by mouth daily.     [name] b complex vitamins tablet Take 1 tablet by mouth daily.    . blood glucose meter kit and supplies Dispense based on patient and insurance preference. Use up to four times daily as directed. (FOR ICD-10 E10.9, E11.9). 1 each 0  . Blood Glucose Monitoring Suppl (ACCU-CHEK GUIDE ME) w/Device KIT USE METER TO CHECK GLUCOSE THREE TIMES DAILY BEFORE MEAL(S) 1 kit 3  . cholecalciferol (VITAMIN D3) 25 MCG (1000 UT) tablet Take 1,000 Units by mouth daily.    . ferrous sulfate 324 (65 Fe) MG TBEC Take 1 tablet (325 mg total) by mouth 2 (two) times daily. 180 tablet 3  . fluticasone (FLONASE) 50 MCG/ACT nasal spray Place 2 sprays into both nostrils daily. 16 g 6  . furosemide (LASIX) 40 MG tablet Take 1 tablet (40 mg total) by mouth in the morning and at bedtime. 180 tablet 1  . guaiFENesin (MUCINEX) 600 MG 12 hr tablet Take 600 mg by mouth 2 (two) times daily as needed for cough.    . lidocaine (LIDODERM) 5 % Place 1 patch onto the skin daily. Remove & Discard patch within 12 hours or as directed by MD 30 patch 0  . losartan (COZAAR) 100 MG tablet Take 1 tablet (100 mg total) by mouth daily. 90 tablet 3  . methocarbamol (ROBAXIN) 500 MG tablet Take 1 tablet (500 mg total) by mouth 2 (two) times daily as needed for muscle spasms. 30 tablet 0  . metoprolol succinate (TOPROL-XL) 25 MG 24 hr tablet TAKE ONE (1) TABLET BY MOUTH ONCE DAILY 90 tablet 3  . Multiple Vitamins-Minerals (CENTRUM SILVER PO) Take 1 tablet by mouth daily.    [name] oxycodone (OXY-IR) 5 MG capsule Take 1 capsule (5 mg total) by mouth at bedtime as needed. 30 capsule 0  . pravastatin (PRAVACHOL) 40 MG tablet Take 1 tablet (40 mg total) by mouth  daily. 90 tablet 3  . spironolactone (ALDACTONE) 25 MG tablet Take 1 tablet (25 mg total) by mouth daily. 90 tablet 1  . allopurinol (ZYLOPRIM) 100 MG tablet TAKE 1/2 TABLET BY MOUTH ONCE DAILY (Patient not taking: Reported on 03/01/2020) 15 tablet 0  . apixaban (ELIQUIS) 5 MG TABS tablet Take 1 tablet (5 mg total) by mouth 2 (two) times daily. (Patient not  taking: Reported on 03/01/2020) 120 tablet 0   No facility-administered medications prior to visit.    Allergies  Allergen Reactions  . Ibuprofen Hypertension    Patient stopped taking on his own due to his high blood pressure.  . Tylenol [Acetaminophen]     Patient stopped taking this due to concerns about taking it with hypertension.  Denied any negative effects     ROS Review of Systems  Constitutional: Negative.   HENT: Negative.   Eyes: Negative.   Respiratory: Negative.   Cardiovascular: Negative.   Gastrointestinal: Negative.   Genitourinary: Negative.   Musculoskeletal: Positive for arthralgias (r shoulder, OA). Negative for back pain, gait problem, joint swelling, myalgias, neck pain and neck stiffness.  Skin: Negative.   Neurological: Negative.   Psychiatric/Behavioral: Negative.   All other systems reviewed and are negative.     Objective:    Physical Exam Vitals and nursing note reviewed.  Constitutional:      General: He is not in acute distress.    Appearance: Normal appearance. He is obese. He is not ill-appearing, toxic-appearing or diaphoretic.  HENT:     Head: Normocephalic and atraumatic.     Right Ear: Tympanic membrane, ear canal and external ear normal. There is no impacted cerumen.     Left Ear: Tympanic membrane, ear canal and external ear normal. There is no impacted cerumen.     Nose: Nose normal. No congestion or rhinorrhea.     Mouth/Throat:     Mouth: Mucous membranes are moist.     Pharynx: Oropharynx is clear. No oropharyngeal exudate or posterior oropharyngeal erythema.  Eyes:      General: No scleral icterus.       Right eye: No discharge.        Left eye: No discharge.     Extraocular Movements: Extraocular movements intact.     Conjunctiva/sclera: Conjunctivae normal.     Pupils: Pupils are equal, round, and reactive to light.  Neck:     Vascular: No carotid bruit.  Cardiovascular:     Rate and Rhythm: Normal rate. Rhythm irregular.     Pulses: Normal pulses.     Heart sounds: No murmur heard.  No friction rub. No gallop.      Comments: Irregularly irregular beats noted on auscultation. Pulmonary:     Effort: Pulmonary effort is normal. No respiratory distress.     Breath sounds: Normal breath sounds. No stridor. No wheezing, rhonchi or rales.  Chest:     Chest wall: No tenderness.  Abdominal:     General: Abdomen is flat. Bowel sounds are normal. There is no distension.     Palpations: Abdomen is soft. There is no mass.     Tenderness: There is no abdominal tenderness. There is no right CVA tenderness, left CVA tenderness, guarding or rebound.     Hernia: No hernia is present.  Musculoskeletal:        General: Tenderness (r shoulder, joint line) present. No swelling, deformity or signs of injury. Normal range of motion.     Cervical back: Normal range of motion and neck supple. No rigidity or tenderness.     Right lower leg: No edema.     Left lower leg: No edema.  Lymphadenopathy:     Cervical: No cervical adenopathy.  Skin:    General: Skin is warm and dry.     Capillary Refill: Capillary refill takes less than 2 seconds.     Coloration: Skin is not jaundiced  or pale.     Findings: No bruising, erythema, lesion or rash.  Neurological:     General: No focal deficit present.     Mental Status: He is alert and oriented to person, place, and time. Mental status is at baseline.     Cranial Nerves: No cranial nerve deficit.     Motor: No weakness.     Gait: Gait normal.  Psychiatric:        Mood and Affect: Mood normal.        Behavior: Behavior  normal.        Thought Content: Thought content normal.        Judgment: Judgment normal.     BP (!) 191/117   Pulse 80   Temp (!) 97.4 F (36.3 C) (Temporal)   Resp 18   Ht 6' 1"  (1.854 m)   Wt (!) 331 lb (150.1 kg)   SpO2 100%   BMI 43.67 kg/m  Wt Readings from Last 3 Encounters:  03/18/20 (!) 331 lb (150.1 kg)  03/01/20 (!) 338 lb 12.8 oz (153.7 kg)  02/01/20 (!) 334 lb (151.5 kg)     Health Maintenance Due  Topic Date Due  . OPHTHALMOLOGY EXAM  Never done    There are no preventive care reminders to display for this patient.  No results found for: TSH Lab Results  Component Value Date   WBC 4.5 (A) 06/06/2019   HGB 9.7 (A) 06/06/2019   HCT 30.6 06/06/2019   MCV 72.3 (A) 06/06/2019   PLT 206 05/05/2019   Lab Results  Component Value Date   NA 139 11/14/2019   K 4.9 11/14/2019   CO2 25 11/14/2019   GLUCOSE 135 (H) 11/14/2019   BUN 45 (H) 11/14/2019   CREATININE 2.67 (H) 11/14/2019   BILITOT 0.4 11/14/2019   ALKPHOS 46 (L) 11/14/2019   AST 15 11/14/2019   ALT 12 11/14/2019   PROT 6.4 11/14/2019   ALBUMIN 4.1 11/14/2019   CALCIUM 9.1 11/14/2019   ANIONGAP 10 04/28/2019   GFR 66.34 07/07/2013   Lab Results  Component Value Date   CHOL 157 11/14/2019   Lab Results  Component Value Date   HDL 45 11/14/2019   Lab Results  Component Value Date   LDLCALC 92 11/14/2019   Lab Results  Component Value Date   TRIG 113 11/14/2019   Lab Results  Component Value Date   CHOLHDL 3.5 11/14/2019   Lab Results  Component Value Date   HGBA1C 6.2 (A) 11/14/2019      Assessment & Plan:   Problem List Items Addressed This Visit    None    Visit Diagnoses    Preoperative examination    -  Primary   Relevant Orders   EKG 12-Lead (Completed)   Protime-INR   CBC With Differential   Hemoglobin A1c   Comprehensive metabolic panel   Urinalysis   DG Chest 2 View   D-dimer, quantitative (not at [facility])   Ventricular ectopic beat       Relevant Orders    Ambulatory referral to Cardiology      No orders of the defined types were placed in this encounter.   Follow-up: No follow-ups on file.   PLAN  ekg shows new ventricular ectopic beats and t wave inversion on v4 and v5 as compared to previous reading on Apr 19 2019. A flutter stable. Rate is lower on today's ekg. No hallmarks of PE, pt is asymptomatic. Rather, concern for ischemia  given his history. Discussed er precautions, pt voices understanding  Labs collected  Exam unremarkable  Patient will need both cardiology and nephrology clearance prior to having shoulder replacement surgery.  Patient encouraged to call clinic with any questions, comments, or concerns.  Maximiano Coss, NP

## 2020-03-18 NOTE — Patient Instructions (Signed)
° ° ° °  If you have lab work done today you will be contacted with your lab results within the next 2 weeks.  If you have not heard from us then please contact us. The fastest way to get your results is to register for My Chart. ° ° °IF you received an x-ray today, you will receive an invoice from [facility]. Please contact [facility] at [phone] with questions or concerns regarding your invoice.  ° °IF you received labwork today, you will receive an invoice from [facility]. Please contact [facility] at [phone] with questions or concerns regarding your invoice.  ° °Our billing staff will not be able to assist you with questions regarding bills from these companies. ° °You will be contacted with the lab results as soon as they are available. The fastest way to get your results is to activate your My Chart account. Instructions are located on the last page of this paperwork. If you have not heard from us regarding the results in 2 weeks, please contact this office. °  ° ° ° °

## 2020-03-19 LAB — CBC WITH DIFFERENTIAL
Basophils Absolute: 0.1 10*3/uL (ref 0.0–0.2)
Basos: 1 %
EOS (ABSOLUTE): 0.3 10*3/uL (ref 0.0–0.4)
Eos: 5 %
Hematocrit: 36.5 % — ABNORMAL LOW (ref 37.5–51.0)
Hemoglobin: 11.5 g/dL — ABNORMAL LOW (ref 13.0–17.7)
Immature Grans (Abs): 0 10*3/uL (ref 0.0–0.1)
Immature Granulocytes: 0 %
Lymphocytes Absolute: 1.4 10*3/uL (ref 0.7–3.1)
Lymphs: 28 %
MCH: 22.8 pg — ABNORMAL LOW (ref 26.6–33.0)
MCHC: 31.5 g/dL (ref 31.5–35.7)
MCV: 72 fL — ABNORMAL LOW (ref 79–97)
Monocytes Absolute: 0.5 10*3/uL (ref 0.1–0.9)
Monocytes: 9 %
Neutrophils Absolute: 2.8 10*3/uL (ref 1.4–7.0)
Neutrophils: 57 %
RBC: 5.05 x10E6/uL (ref 4.14–5.80)
RDW: 15.6 % — ABNORMAL HIGH (ref 11.6–15.4)
WBC: 5 10*3/uL (ref 3.4–10.8)

## 2020-03-19 LAB — COMPREHENSIVE METABOLIC PANEL
ALT: 12 IU/L (ref 0–44)
AST: 17 IU/L (ref 0–40)
Albumin/Globulin Ratio: 1.9 (ref 1.2–2.2)
Albumin: 4.2 g/dL (ref 3.7–4.7)
Alkaline Phosphatase: 60 IU/L (ref 44–121)
BUN/Creatinine Ratio: 12 (ref 10–24)
BUN: 30 mg/dL — ABNORMAL HIGH (ref 8–27)
Bilirubin Total: 0.3 mg/dL (ref 0.0–1.2)
CO2: 23 mmol/L (ref 20–29)
Calcium: 8.9 mg/dL (ref 8.6–10.2)
Chloride: 103 mmol/L (ref 96–106)
Creatinine, Ser: 2.51 mg/dL — ABNORMAL HIGH (ref 0.76–1.27)
GFR calc Af Amer: 28 mL/min/{1.73_m2} — ABNORMAL LOW (ref >59)
GFR calc non Af Amer: 24 mL/min/{1.73_m2} — ABNORMAL LOW (ref >59)
Globulin, Total: 2.2 g/dL (ref 1.5–4.5)
Glucose: 98 mg/dL (ref 65–99)
Potassium: 5 mmol/L (ref 3.5–5.2)
Sodium: 140 mmol/L (ref 134–144)
Total Protein: 6.4 g/dL (ref 6.0–8.5)

## 2020-03-19 LAB — HEMOGLOBIN A1C
Est. average glucose Bld gHb Est-mCnc: 128 mg/dL
Hgb A1c MFr Bld: 6.1 % — ABNORMAL HIGH (ref 4.8–5.6)

## 2020-03-19 LAB — PROTIME-INR
INR: 1.1 (ref 0.9–1.2)
Prothrombin Time: 11.6 s (ref 9.1–12.0)

## 2020-03-19 LAB — D-DIMER, QUANTITATIVE: D-DIMER: 5.83 mg/L FEU — ABNORMAL HIGH (ref 0.00–0.49)

## 2020-04-18 DIAGNOSIS — K219 Gastro-esophageal reflux disease without esophagitis: Secondary | ICD-10-CM | POA: Diagnosis not present

## 2020-04-18 DIAGNOSIS — E785 Hyperlipidemia, unspecified: Secondary | ICD-10-CM | POA: Diagnosis not present

## 2020-04-18 DIAGNOSIS — M109 Gout, unspecified: Secondary | ICD-10-CM | POA: Diagnosis not present

## 2020-04-18 DIAGNOSIS — Z79899 Other long term (current) drug therapy: Secondary | ICD-10-CM | POA: Diagnosis not present

## 2020-04-18 DIAGNOSIS — J309 Allergic rhinitis, unspecified: Secondary | ICD-10-CM | POA: Diagnosis not present

## 2020-04-18 DIAGNOSIS — Z7982 Long term (current) use of aspirin: Secondary | ICD-10-CM | POA: Diagnosis not present

## 2020-04-18 DIAGNOSIS — M1990 Unspecified osteoarthritis, unspecified site: Secondary | ICD-10-CM | POA: Diagnosis not present

## 2020-04-18 DIAGNOSIS — Z008 Encounter for other general examination: Secondary | ICD-10-CM | POA: Diagnosis not present

## 2020-04-18 DIAGNOSIS — I10 Essential (primary) hypertension: Secondary | ICD-10-CM | POA: Diagnosis not present

## 2020-04-18 DIAGNOSIS — Z6841 Body Mass Index (BMI) 40.0 and over, adult: Secondary | ICD-10-CM | POA: Diagnosis not present

## 2020-04-23 DIAGNOSIS — E669 Obesity, unspecified: Secondary | ICD-10-CM | POA: Diagnosis not present

## 2020-04-23 DIAGNOSIS — N1832 Chronic kidney disease, stage 3b: Secondary | ICD-10-CM | POA: Diagnosis not present

## 2020-04-23 DIAGNOSIS — I129 Hypertensive chronic kidney disease with stage 1 through stage 4 chronic kidney disease, or unspecified chronic kidney disease: Secondary | ICD-10-CM | POA: Diagnosis not present

## 2020-04-23 DIAGNOSIS — E875 Hyperkalemia: Secondary | ICD-10-CM | POA: Diagnosis not present

## 2020-04-23 DIAGNOSIS — I82409 Acute embolism and thrombosis of unspecified deep veins of unspecified lower extremity: Secondary | ICD-10-CM | POA: Diagnosis not present

## 2020-04-23 DIAGNOSIS — I480 Paroxysmal atrial fibrillation: Secondary | ICD-10-CM | POA: Diagnosis not present

## 2020-04-23 DIAGNOSIS — D649 Anemia, unspecified: Secondary | ICD-10-CM | POA: Diagnosis not present

## 2020-04-23 DIAGNOSIS — R609 Edema, unspecified: Secondary | ICD-10-CM | POA: Diagnosis not present

## 2020-04-23 DIAGNOSIS — M12819 Other specific arthropathies, not elsewhere classified, unspecified shoulder: Secondary | ICD-10-CM | POA: Diagnosis not present

## 2020-04-25 DIAGNOSIS — H5202 Hypermetropia, left eye: Secondary | ICD-10-CM | POA: Diagnosis not present

## 2020-04-30 NOTE — Progress Notes (Signed)
Nathan Grant Date of Birth: 1945/07/13 Medical Record #[id number]  History of Present Illness: Mr. Nathan Grant is seen at the request of Dr Orland Mustard for evaluation of PVCs.  He was last seen by me in July 2018. He has a history of past AF/flutter, HTN, tobacco abuse and HLD. He is s/p DCCV in 2012. He has hypertensive heart disease with severe LVH. He  had a Myoview in February of 2014 due to chest pain which showed no ischemia and a normal EF. Was hospitalized back in October of 2014 with chest pain - was cathed - this was normal. Has had noted brief NSVT, accelerated junctional rhythm and bradycardia. Ecg in 2018 showed an ectopic atrial rhythm.  He was admitted in Jan. 2021 with acute respiratory distress in setting of Covid 19. Dopplers were positive for bilateral DVTs and Echo showed thrombus in the RA and PA with acute RV strain and severe pulmonary HTN. He was treated with TPA and transitioned to Eliquis. This was later discontinued due to cost. States he took Eliquis for about 3 months. He was in Atrial flutter at that time.    He was recently seen by primary care in December for pre operative clearance for shoulder surgery. Ecg showed Atrial flutter with controlled response and occ. PVCs. There was LVH and diffuse T wave inversion. Cardiology evaluation was recommended. He also has CKD stage IV.  Patient states he is doing well from a cardiac standpoint. He has chronic ankle swelling that is unchanged for years. He denies any chest pain, orthopnea, SOB, palpitations, dizziness or syncope. He primarily complains of right shoulder pain which limits his ability to sleep. He has seen Dr Onnie Graham for this. He is reasonably active around the house.  Current Outpatient Medications  Medication Sig Dispense Refill  . albuterol (VENTOLIN HFA) 108 (90 Base) MCG/ACT inhaler Inhale 2 puffs into the lungs every 6 (six) hours as needed for wheezing. 18 g 0  . amLODipine (NORVASC) 10 MG tablet TAKE ONE (1)  TABLET BY MOUTH ONCE DAILY 90 tablet 3  . Ascorbic Acid (VITAMIN C) 500 MG CAPS Take 500 mg by mouth daily.     [name] b complex vitamins tablet Take 1 tablet by mouth daily.    . blood glucose meter kit and supplies Dispense based on patient and insurance preference. Use up to four times daily as directed. (FOR ICD-10 E10.9, E11.9). 1 each 0  . Blood Glucose Monitoring Suppl (ACCU-CHEK GUIDE ME) w/Device KIT USE METER TO CHECK GLUCOSE THREE TIMES DAILY BEFORE MEAL(S) 1 kit 3  . cholecalciferol (VITAMIN D3) 25 MCG (1000 UT) tablet Take 1,000 Units by mouth daily.    . ferrous sulfate 324 (65 Fe) MG TBEC Take 1 tablet (325 mg total) by mouth 2 (two) times daily. 180 tablet 3  . fluticasone (FLONASE) 50 MCG/ACT nasal spray Place 2 sprays into both nostrils daily. 16 g 6  . furosemide (LASIX) 40 MG tablet Take 1 tablet (40 mg total) by mouth in the morning and at bedtime. 180 tablet 1  . guaiFENesin (MUCINEX) 600 MG 12 hr tablet Take 600 mg by mouth 2 (two) times daily as needed for cough.    . lidocaine (LIDODERM) 5 % Place 1 patch onto the skin daily. Remove & Discard patch within 12 hours or as directed by MD 30 patch 0  . losartan (COZAAR) 100 MG tablet Take 1 tablet (100 mg total) by mouth daily. 90 tablet 3  . metoprolol succinate (TOPROL-XL) 25  MG 24 hr tablet TAKE ONE (1) TABLET BY MOUTH ONCE DAILY 90 tablet 3  . Multiple Vitamins-Minerals (CENTRUM SILVER PO) Take 1 tablet by mouth daily.    [name] oxycodone (OXY-IR) 5 MG capsule Take 1 capsule (5 mg total) by mouth at bedtime as needed. 30 capsule 0  . pravastatin (PRAVACHOL) 40 MG tablet Take 1 tablet (40 mg total) by mouth daily. 90 tablet 3  . spironolactone (ALDACTONE) 25 MG tablet Take 1 tablet (25 mg total) by mouth daily. 90 tablet 1   No current facility-administered medications for this visit.    Allergies  Allergen Reactions  . Ibuprofen Hypertension    Patient stopped taking on his own due to his high blood pressure.  . Tylenol  [Acetaminophen]     Patient stopped taking this due to concerns about taking it with hypertension.  Denied any negative effects     Past Medical History:  Diagnosis Date  . Atrial flutter ([place])    s/p cardioversion 06/2010  . Bradycardia    a. Accelerated junctional & sinus bradycardia during 01/2013 adm.  . Chronic headaches   . Degenerative joint disease     End-stage degenerative joint disease, left knee  . Erectile dysfunction   . HTN (hypertension)   . Hypertensive heart disease    a. Admit for CP 01/2013 felt due to this. Cath with normal coronary anatomy.  . Microcytic anemia  03/22/2001  . Morbid obesity ([place])   . NSVT (nonsustained ventricular tachycardia) ([place])    a. During 01/2013 adm.  . Obesity   . Osteoarthritis   . Tendonitis   . Tobacco chew use     History of chewing tobacco usage.   . Urinary tract infection     Pseudomonas urinary tract infection    Past Surgical History:  Procedure Laterality Date  . CARDIOVERSION  07/01/2010  . LEFT HEART CATHETERIZATION WITH CORONARY ANGIOGRAM N/A 01/16/2013   Procedure: LEFT HEART CATHETERIZATION WITH CORONARY ANGIOGRAM;  Surgeon: Perris Tripathi M [place], MD;  Location: [facility] CATH LAB;  Service: Cardiovascular;  Laterality: N/A;  . TOTAL KNEE ARTHROPLASTY  May 2009   Bilateral  . VASECTOMY      Social History   Tobacco Use  Smoking Status Never Smoker  Smokeless Tobacco Current User  . Types: Chew    Social History   Substance and Sexual Activity  Alcohol Use No    Family History  Problem Relation Age of Onset  . Alcohol abuse Brother   . Hypertension Brother   . Hypertension Mother     Review of Systems: The review of systems is per the HPI.  All other systems were reviewed and are negative.  Physical Exam: BP (!) 145/82   Pulse 81   Ht $R'6\' 1"'jr$  (1.854 m)   Wt (!) 331 lb 9.6 oz (150.4 kg)   SpO2 97%   BMI 43.75 kg/m  GENERAL:  Well appearing, obese BM in NAD HEENT:  PERRL, EOMI, sclera are clear.  Oropharynx is clear. NECK:  No jugular venous distention, carotid upstroke brisk and symmetric, no bruits, no thyromegaly or adenopathy LUNGS:  Clear to auscultation bilaterally CHEST:  Unremarkable HEART:  IRRR,  PMI not displaced or sustained,S1 and S2 within normal limits, no S3, no S4: no clicks, no rubs, no murmurs ABD:  Soft, nontender. BS +, no masses or bruits. No hepatomegaly, no splenomegaly EXT:  2 + pulses throughout, 2+ edema, no cyanosis no clubbing SKIN:  Warm and dry.  No rashes  NEURO:  Alert and oriented x 3. Cranial nerves II through XII intact. PSYCH:  Cognitively intact    Wt Readings from Last 3 Encounters:  05/03/20 (!) 331 lb 9.6 oz (150.4 kg)  03/18/20 (!) 331 lb (150.1 kg)  03/01/20 (!) 338 lb 12.8 oz (153.7 kg)     LABORATORY DATA:   Lab Results  Component Value Date   WBC 5.0 03/18/2020   HGB 11.5 (L) 03/18/2020   HCT 36.5 (L) 03/18/2020   PLT 206 05/05/2019   GLUCOSE 98 03/18/2020   CHOL 157 11/14/2019   TRIG 113 11/14/2019   HDL 45 11/14/2019   LDLCALC 92 11/14/2019   ALT 12 03/18/2020   AST 17 03/18/2020   NA 140 03/18/2020   K 5.0 03/18/2020   CL 103 03/18/2020   CREATININE 2.51 (H) 03/18/2020   BUN 30 (H) 03/18/2020   CO2 23 03/18/2020   INR 1.1 03/18/2020   HGBA1C 6.1 (H) 03/18/2020    Labs dated 09/21/16: potassium 3.6, BUN 24, creatinine 1.74. A1c 5.9%.  Dated 11/28/15: cholesterol 141, triglycerides 104, HDL 46, LDL 74.   Ecg today shows atrial flutter with rate 82 bpm.  T wave inversion c/w inferolateral ischemia. I have personally reviewed and interpreted this study.   Echo 05/29/12: Study Conclusions  - Left ventricle: Wall thickness was increased in a pattern of severe LVH. There is a suggestive of assymetric apical hypertrphy seen in some views. Systolic function was vigorous. The estimated ejection fraction was in the range of 65% to 70%. Doppler parameters are consistent with abnormal left ventricular  relaxation (grade 1 diastolic dysfunction). - Left atrium: The atrium was moderately dilated.  Myoview 05/30/12: Study Result   Mr. Carstarphen is a 75 yo who was admitted with chest pain.  A 2 day Leane Call was ordered for further evaluation.  The patient received an IV injection of Myoview and the resting Myoview images were obtained following brief delay.  The following day the patient received an IV injection of Lexiscan. He had no ST-T wave changes during the injection.  A second dose of Myoview was injected after 45 seconds. Quantitated gated SPECT images were obtained following brief delay.  The raw data images reveal minimal motion artifact.  The stress Myoview images reveal fairly smooth and homogeneous uptake in all areas of the myocardium.  The resting Myoview images reveals similar pattern of smooth and homogeneous uptake of all areas of the myocardium.  The cine loop images reveal fairly normal thickening and contractility of the myocardium.  The computer algorithm calculates an end diastolic volume of 542 ml.  The end-systolic volume is 706 ml.  Ejection fraction is 37%. Actually into the left ventricular systolic function is higher than the neck - probably in the range of 55-60%.  The computer derived contours do not match  the area of uptake in the left ventricle.  Impression: No evidence of ischemia The normal left ventricular systolic function by visual inspection.  The computer generated left ventricular function is moderately depressed but I think the computer is clearly underestimating the left ventricular contractility. An echocardiogram performed several days ago confirms normal left ventricular systolic function with an EF of 65-70%.  [name], Brooke Bonito., MD, [facility]    Cardiac cath 01/26/13: Cardiac Catheterization Procedure Note  Name: Rudell Ortman MRN: [id number] DOB: [birth year]  Procedure: Left Heart Cath, Selective  Coronary Angiography, LV angiography  Indication: 75 yo BM with history of severe HTN presents with symptoms of increasing  chest pain.                                   Procedural Details: The right wrist was prepped, draped, and anesthetized with 1% lidocaine. Using the modified Seldinger technique, a 5 French sheath was introduced into the right radial artery. 3 mg of verapamil was administered through the sheath, weight-based unfractionated heparin was administered intravenously. Standard Judkins catheters were used for selective coronary angiography and left ventriculography. Catheter exchanges were performed over an exchange length guidewire. There were no immediate procedural complications. A TR band was used for radial hemostasis at the completion of the procedure.  The patient was transferred to the post catheterization recovery area for further monitoring.  Procedural Findings: Hemodynamics: AO 145/77 mean 103 mm Hg LV 150/20 mm Hg  Coronary angiography: Coronary dominance: right  Left mainstem: Normal  Left anterior descending (LAD): Very large, normal.  Left circumflex (LCx): large, 3 OM branches, normal.  Right coronary artery (RCA): Very large, normal.  Left ventriculography: Left ventricular systolic function is normal, LVEF is estimated at 60-65%, there is no significant mitral regurgitation   Final Conclusions:   1. Normal coronary anatomy. 2. Normal LV function.   Recommendations: Medical therapy with focus on BP control. Will add hydralazine to his prior meds.  [facility] 01/16/2013, 5:01 PM  Echo 04/20/19:IMPRESSIONS    1. Normal LVEF with severe pulmonary hypertension and likely PA thrombus  in right PA, suggestive of pulmonary embolism. RV is only midly enlarged.  Findings communicated with Dr. Sloan Leiter.  2. Left ventricular ejection fraction, by visual estimation, is 65 to  70%. The left ventricle has hyperdynamic function. There is  severely  increased left ventricular hypertrophy.  3. Right ventricular pressure overload.  4. The left ventricle has no regional wall motion abnormalities.  5. Global right ventricle has moderately reduced systolic function.The  right ventricular size is mildly enlarged. Right vetricular wall thickness  was not assessed.  6. Preserved RV apical function, consistent with McConnell's sign. RV  size normal to only slightly enlarged depending on imaging window.  7. Left atrial size was mildly dilated.  8. Right atrial size was moderately dilated.  9. Small pericardial effusion.  10. The pericardial effusion is circumferential.  11. The mitral valve is normal in structure. Trivial mitral valve  regurgitation.  12. The tricuspid valve is normal in structure.  13. The aortic valve is tricuspid. Aortic valve regurgitation is trivial.  Mild aortic valve sclerosis without stenosis.  14. Pulmonic regurgitation is moderate.  15. The pulmonic valve was grossly normal. Pulmonic valve regurgitation is  moderate.  16. Severely elevated pulmonary artery systolic pressure.  47. Suspect thromboembolism in the main pulmonary artery.  18. There is a mobile mass seen in the right PA highly suspicious for  thrombus. Additional thrombus burden not excluded.  19. The tricuspid regurgitant velocity is 3.74 m/s, and with an assumed  right atrial pressure of 15 mmHg, the estimated right ventricular systolic  pressure is severely elevated at 70.8 mmHg.  20. The inferior vena cava is dilated in size with <50% respiratory  variability, suggesting right atrial pressure of 15 mmHg.    Assessment / Plan: 1. Hypertensive heart disease with severe LVH and chronic diastolic CHF - BPcontrol is fairly well controlled and he is on a good medical regimen. He does have chronic edema but this is unchanged.    Recommend sodium restriction.  2. Chronic diastolic CHF - volume status is stable today.   3. Obesity -  focus on weight loss and healthy diet.   4. CKD stage 4 secondary to chronic HTN.  5. Atrial flutter. S/p DCCV in 2012. Recurrent in Jan 2021 and persistent. He is asymptomatic. Rate is well controlled. Continue Toprol XL for rate control. He has a [place] Vasc score of 4 so is at high risk for embolic CVA. I have recommended long term anticoagulation. DOAC therapy is cost prohibitive. Recommend starting Coumadin. Will refer to pharmacy.    6. History of bilateral DVT and PE in setting of Covid 19 - Jan 2021. Treated acutely with TPA then Eliquis for 3 months. Significant right heart strain and pulmonary HTN noted at that time. Will update Echo now.  7. Rotator cuff injury right shoulder. If Echo looks OK will clear for surgery. Can interrupt Coumadin for 5 days for surgery.  I will follow up in 3 months.

## 2020-05-03 ENCOUNTER — Other Ambulatory Visit: Payer: Self-pay

## 2020-05-03 ENCOUNTER — Ambulatory Visit (INDEPENDENT_AMBULATORY_CARE_PROVIDER_SITE_OTHER): Payer: Medicare HMO | Admitting: Cardiology

## 2020-05-03 ENCOUNTER — Encounter: Payer: Self-pay | Admitting: Cardiology

## 2020-05-03 VITALS — BP 145/82 | HR 81 | Ht 73.0 in | Wt 331.6 lb

## 2020-05-03 DIAGNOSIS — I5032 Chronic diastolic (congestive) heart failure: Secondary | ICD-10-CM | POA: Diagnosis not present

## 2020-05-03 DIAGNOSIS — N184 Chronic kidney disease, stage 4 (severe): Secondary | ICD-10-CM | POA: Diagnosis not present

## 2020-05-03 DIAGNOSIS — I483 Typical atrial flutter: Secondary | ICD-10-CM | POA: Diagnosis not present

## 2020-05-03 DIAGNOSIS — I110 Hypertensive heart disease with heart failure: Secondary | ICD-10-CM

## 2020-05-03 DIAGNOSIS — I10 Essential (primary) hypertension: Secondary | ICD-10-CM

## 2020-05-03 DIAGNOSIS — Z86711 Personal history of pulmonary embolism: Secondary | ICD-10-CM

## 2020-05-03 NOTE — Patient Instructions (Signed)
Medication Instructions:  Continue same medications *If you need a refill on your cardiac medications before your next appointment, please call your pharmacy*   Lab Work: None ordered   Testing/Procedures: Echo   Follow-Up: At [facility], you and your health needs are our priority.  As part of our continuing mission to provide you with exceptional heart care, we have created designated Provider Care Teams.  These Care Teams include your primary Cardiologist (physician) and Advanced Practice Providers (APPs -  Physician Assistants and Nurse Practitioners) who all work together to provide you with the care you need, when you need it.  We recommend signing up for the patient portal called "MyChart".  Sign up information is provided on this After Visit Summary.  MyChart is used to connect with patients for Virtual Visits (Telemedicine).  Patients are able to view lab/test results, encounter notes, upcoming appointments, etc.  Non-urgent messages can be sent to your provider as well.   To learn more about what you can do with MyChart, go to [email or web address].    Your next appointment:  3 months    The format for your next appointment: Office    Provider:  Dr.Jordan    Schedule appointment with Pharmacy to start Coumadin

## 2020-05-08 ENCOUNTER — Encounter: Payer: Self-pay | Admitting: Registered Nurse

## 2020-05-08 NOTE — Progress Notes (Signed)
Acute Office Visit  Subjective:    Patient ID: Nathan Grant, male    DOB: [birth year], 75 y.o.   MRN: [id number]  Chief Complaint  Patient presents with  . Shoulder Pain    Patient states he is having some left shoulder pain. Per patient he also had an xray done here before showing a problem with the same shoulder,    HPI Patient is in today for shoulder pain Flare of OA Feeling limited ROM d/t pain Some mild swelling. No numbness or tingling in hand No neck pain  Has been working with ortho to discuss potential for replacement, however, renal function has been in decline for some time. In addition, he has had a very rough course with covid and long hauler symptoms.  Otherwise feeling well  Past Medical History:  Diagnosis Date  . Atrial flutter ([place])    s/p cardioversion 06/2010  . Bradycardia    a. Accelerated junctional & sinus bradycardia during 01/2013 adm.  . Chronic headaches   . Degenerative joint disease     End-stage degenerative joint disease, left knee  . Erectile dysfunction   . HTN (hypertension)   . Hypertensive heart disease    a. Admit for CP 01/2013 felt due to this. Cath with normal coronary anatomy.  . Microcytic anemia  03/22/2001  . Morbid obesity ([place])   . NSVT (nonsustained ventricular tachycardia) ([place])    a. During 01/2013 adm.  . Obesity   . Osteoarthritis   . Tendonitis   . Tobacco chew use     History of chewing tobacco usage.   . Urinary tract infection     Pseudomonas urinary tract infection    Past Surgical History:  Procedure Laterality Date  . CARDIOVERSION  07/01/2010  . LEFT HEART CATHETERIZATION WITH CORONARY ANGIOGRAM N/A 01/16/2013   Procedure: LEFT HEART CATHETERIZATION WITH CORONARY ANGIOGRAM;  Surgeon: Peter M Martinique, MD;  Location: [facility] CATH LAB;  Service: Cardiovascular;  Laterality: N/A;  . TOTAL KNEE ARTHROPLASTY  May 2009   Bilateral  . VASECTOMY      Family History  Problem Relation Age of Onset  . Alcohol  abuse Brother   . Hypertension Brother   . Hypertension Mother     Social History   Socioeconomic History  . Marital status: Married    Spouse name: Not on file  . Number of children: 6  . Years of education: Not on file  . Highest education level: Not on file  Occupational History  . Occupation: Day Care    Employer: RETIRED  Tobacco Use  . Smoking status: Never Smoker  . Smokeless tobacco: Current User    Types: Chew  Substance and Sexual Activity  . Alcohol use: No  . Drug use: No  . Sexual activity: Yes  Other Topics Concern  . Not on file  Social History Narrative  . Not on file   Social Determinants of Health   Financial Resource Strain: Not on file  Food Insecurity: Not on file  Transportation Needs: Not on file  Physical Activity: Not on file  Stress: Not on file  Social Connections: Not on file  Intimate Partner Violence: Not on file    Outpatient Medications Prior to Visit  Medication Sig Dispense Refill  . albuterol (VENTOLIN HFA) 108 (90 Base) MCG/ACT inhaler Inhale 2 puffs into the lungs every 6 (six) hours as needed for wheezing. 18 g 0  . amLODipine (NORVASC) 10 MG tablet TAKE ONE (1) TABLET BY MOUTH  ONCE DAILY 90 tablet 3  . Ascorbic Acid (VITAMIN C) 500 MG CAPS Take 500 mg by mouth daily.     [name] b complex vitamins tablet Take 1 tablet by mouth daily.    . blood glucose meter kit and supplies Dispense based on patient and insurance preference. Use up to four times daily as directed. (FOR ICD-10 E10.9, E11.9). 1 each 0  . Blood Glucose Monitoring Suppl (ACCU-CHEK GUIDE ME) w/Device KIT USE METER TO CHECK GLUCOSE THREE TIMES DAILY BEFORE MEAL(S) 1 kit 3  . cholecalciferol (VITAMIN D3) 25 MCG (1000 UT) tablet Take 1,000 Units by mouth daily.    . ferrous sulfate 324 (65 Fe) MG TBEC Take 1 tablet (325 mg total) by mouth 2 (two) times daily. 180 tablet 3  . fluticasone (FLONASE) 50 MCG/ACT nasal spray Place 2 sprays into both nostrils daily. 16 g 6  .  furosemide (LASIX) 40 MG tablet Take 1 tablet (40 mg total) by mouth in the morning and at bedtime. 180 tablet 1  . guaiFENesin (MUCINEX) 600 MG 12 hr tablet Take 600 mg by mouth 2 (two) times daily as needed for cough.    . losartan (COZAAR) 100 MG tablet Take 1 tablet (100 mg total) by mouth daily. 90 tablet 3  . metoprolol succinate (TOPROL-XL) 25 MG 24 hr tablet TAKE ONE (1) TABLET BY MOUTH ONCE DAILY 90 tablet 3  . Multiple Vitamins-Minerals (CENTRUM SILVER PO) Take 1 tablet by mouth daily.    . pravastatin (PRAVACHOL) 40 MG tablet Take 1 tablet (40 mg total) by mouth daily. 90 tablet 3  . spironolactone (ALDACTONE) 25 MG tablet Take 1 tablet (25 mg total) by mouth daily. 90 tablet 1  . methocarbamol (ROBAXIN) 500 MG tablet Take 1 tablet (500 mg total) by mouth 2 (two) times daily as needed for muscle spasms. 30 tablet 0  . allopurinol (ZYLOPRIM) 100 MG tablet TAKE 1/2 TABLET BY MOUTH ONCE DAILY (Patient not taking: Reported on 03/01/2020) 15 tablet 0  . apixaban (ELIQUIS) 5 MG TABS tablet Take 1 tablet (5 mg total) by mouth 2 (two) times daily. (Patient not taking: Reported on 03/01/2020) 120 tablet 0   No facility-administered medications prior to visit.    Allergies  Allergen Reactions  . Ibuprofen Hypertension    Patient stopped taking on his own due to his high blood pressure.  . Tylenol [Acetaminophen]     Patient stopped taking this due to concerns about taking it with hypertension.  Denied any negative effects     Review of Systems Per hpi      Objective:    Physical Exam Vitals and nursing note reviewed.  Constitutional:      Appearance: Normal appearance.  Cardiovascular:     Rate and Rhythm: Normal rate and regular rhythm.     Pulses: Normal pulses.     Heart sounds: Normal heart sounds. No murmur heard. No friction rub. No gallop.   Pulmonary:     Effort: Pulmonary effort is normal. No respiratory distress.     Breath sounds: Normal breath sounds. No stridor.  No wheezing, rhonchi or rales.  Musculoskeletal:        General: Swelling and tenderness present. No deformity or signs of injury.     Right lower leg: No edema.     Left lower leg: No edema.     Comments: Limited ROM in all aspects of shoulder  Neurological:     General: No focal deficit present.  Mental Status: He is alert. Mental status is at baseline.  Psychiatric:        Mood and Affect: Mood normal.        Behavior: Behavior normal.        Thought Content: Thought content normal.        Judgment: Judgment normal.     BP (!) 188/118   Pulse 83   Temp 97.9 F (36.6 C) (Temporal)   Resp 18   Ht 6' 1"  (1.854 m)   Wt (!) 338 lb 12.8 oz (153.7 kg)   SpO2 97%   BMI 44.70 kg/m  Wt Readings from Last 3 Encounters:  05/03/20 (!) 331 lb 9.6 oz (150.4 kg)  03/18/20 (!) 331 lb (150.1 kg)  03/01/20 (!) 338 lb 12.8 oz (153.7 kg)    Health Maintenance Due  Topic Date Due  . COVID-19 Vaccine (1) Never done  . OPHTHALMOLOGY EXAM  Never done    There are no preventive care reminders to display for this patient.   No results found for: TSH Lab Results  Component Value Date   WBC 5.0 03/18/2020   HGB 11.5 (L) 03/18/2020   HCT 36.5 (L) 03/18/2020   MCV 72 (L) 03/18/2020   PLT 206 05/05/2019   Lab Results  Component Value Date   NA 140 03/18/2020   K 5.0 03/18/2020   CO2 23 03/18/2020   GLUCOSE 98 03/18/2020   BUN 30 (H) 03/18/2020   CREATININE 2.51 (H) 03/18/2020   BILITOT 0.3 03/18/2020   ALKPHOS 60 03/18/2020   AST 17 03/18/2020   ALT 12 03/18/2020   PROT 6.4 03/18/2020   ALBUMIN 4.2 03/18/2020   CALCIUM 8.9 03/18/2020   ANIONGAP 10 04/28/2019   GFR 66.34 07/07/2013   Lab Results  Component Value Date   CHOL 157 11/14/2019   Lab Results  Component Value Date   HDL 45 11/14/2019   Lab Results  Component Value Date   LDLCALC 92 11/14/2019   Lab Results  Component Value Date   TRIG 113 11/14/2019   Lab Results  Component Value Date   CHOLHDL  3.5 11/14/2019   Lab Results  Component Value Date   HGBA1C 6.1 (H) 03/18/2020       Assessment & Plan:   Problem List Items Addressed This Visit   None   Visit Diagnoses    Acute pain of right shoulder    -  Primary   Relevant Medications   lidocaine (LIDODERM) 5 %   oxycodone (OXY-IR) 5 MG capsule       Meds ordered this encounter  Medications  . lidocaine (LIDODERM) 5 %    Sig: Place 1 patch onto the skin daily. Remove & Discard patch within 12 hours or as directed by MD    Dispense:  30 patch    Refill:  0    Order Specific Question:   Supervising Provider    Answer:   Carlota Raspberry, JEFFREY R [[id number]]  . oxycodone (OXY-IR) 5 MG capsule    Sig: Take 1 capsule (5 mg total) by mouth at bedtime as needed.    Dispense:  30 capsule    Refill:  0    Order Specific Question:   Supervising Provider    Answer:   Carlota Raspberry, JEFFREY R [[id number]]   PLAN  Short course oxycodone  Lidocaine patches  Return prn  Patient encouraged to call clinic with any questions, comments, or concerns.   Maximiano Coss, NP

## 2020-05-21 ENCOUNTER — Ambulatory Visit (HOSPITAL_COMMUNITY): Payer: Medicare HMO | Attending: Internal Medicine

## 2020-05-21 ENCOUNTER — Other Ambulatory Visit: Payer: Self-pay

## 2020-05-21 DIAGNOSIS — N184 Chronic kidney disease, stage 4 (severe): Secondary | ICD-10-CM | POA: Diagnosis not present

## 2020-05-21 DIAGNOSIS — I483 Typical atrial flutter: Secondary | ICD-10-CM

## 2020-05-21 DIAGNOSIS — I110 Hypertensive heart disease with heart failure: Secondary | ICD-10-CM | POA: Insufficient documentation

## 2020-05-21 DIAGNOSIS — I10 Essential (primary) hypertension: Secondary | ICD-10-CM | POA: Insufficient documentation

## 2020-05-21 DIAGNOSIS — I5032 Chronic diastolic (congestive) heart failure: Secondary | ICD-10-CM | POA: Insufficient documentation

## 2020-05-21 DIAGNOSIS — Z86711 Personal history of pulmonary embolism: Secondary | ICD-10-CM | POA: Diagnosis not present

## 2020-05-21 LAB — ECHOCARDIOGRAM COMPLETE
Area-P 1/2: 4.15 cm2
S' Lateral: 3.5 cm

## 2020-05-21 MED ORDER — PERFLUTREN LIPID MICROSPHERE
1.0000 mL | INTRAVENOUS | Status: AC | PRN
  Administered 2020-05-21: 3 mL via INTRAVENOUS

## 2020-05-24 ENCOUNTER — Ambulatory Visit: Payer: Medicare HMO

## 2020-05-24 ENCOUNTER — Ambulatory Visit (INDEPENDENT_AMBULATORY_CARE_PROVIDER_SITE_OTHER): Payer: Medicare HMO

## 2020-05-24 ENCOUNTER — Other Ambulatory Visit: Payer: Self-pay

## 2020-05-24 DIAGNOSIS — Z5181 Encounter for therapeutic drug level monitoring: Secondary | ICD-10-CM | POA: Diagnosis not present

## 2020-05-24 DIAGNOSIS — I4891 Unspecified atrial fibrillation: Secondary | ICD-10-CM | POA: Insufficient documentation

## 2020-05-24 DIAGNOSIS — Z7901 Long term (current) use of anticoagulants: Secondary | ICD-10-CM

## 2020-05-24 DIAGNOSIS — I4892 Unspecified atrial flutter: Secondary | ICD-10-CM | POA: Diagnosis not present

## 2020-05-24 LAB — POCT INR: INR: 1.1 — AB (ref 2.0–3.0)

## 2020-05-24 MED ORDER — WARFARIN SODIUM 5 MG PO TABS: ORAL_TABLET | ORAL | 3 refills | Status: DC

## 2020-05-24 NOTE — Patient Instructions (Addendum)
Take 1 tablet (5 mg) Daily. Recheck INR in 1 week. [phone]  A full discussion of the nature of anticoagulants has been carried out.  A benefit risk analysis has been presented to the patient, so that they understand the justification for choosing anticoagulation at this time. The need for frequent and regular monitoring, precise dosage adjustment and compliance is stressed.  Side effects of potential bleeding are discussed.  The patient should avoid any OTC items containing aspirin or ibuprofen, and should avoid great swings in general diet.  Call if any signs of abnormal bleeding.    Stay consistent with dark green leafy vegetables  Cranberries, Antibiotics, and Steroids will raise INR (International Normalized Ratio)  Blood thickness or thinness. Diarrhea will also raise INR.

## 2020-05-31 ENCOUNTER — Ambulatory Visit (INDEPENDENT_AMBULATORY_CARE_PROVIDER_SITE_OTHER): Payer: Medicare HMO

## 2020-05-31 ENCOUNTER — Other Ambulatory Visit: Payer: Self-pay

## 2020-05-31 DIAGNOSIS — Z7901 Long term (current) use of anticoagulants: Secondary | ICD-10-CM | POA: Diagnosis not present

## 2020-05-31 DIAGNOSIS — I4892 Unspecified atrial flutter: Secondary | ICD-10-CM | POA: Diagnosis not present

## 2020-05-31 LAB — POCT INR: INR: 1.5 — AB (ref 2.0–3.0)

## 2020-05-31 NOTE — Patient Instructions (Signed)
Take 2 tablets today only and then Take 1 tablet (5 mg) Daily except 1.5 tablets on Monday, Wednesday and Friday. Recheck INR in 1 week.   Cranberries, Antibiotics, and Steroids will raise INR (International Normalized Ratio)  Blood thickness or thinness. Diarrhea will also raise INR.

## 2020-06-07 ENCOUNTER — Ambulatory Visit (INDEPENDENT_AMBULATORY_CARE_PROVIDER_SITE_OTHER): Payer: Medicare HMO

## 2020-06-07 ENCOUNTER — Other Ambulatory Visit: Payer: Self-pay

## 2020-06-07 DIAGNOSIS — Z7901 Long term (current) use of anticoagulants: Secondary | ICD-10-CM | POA: Diagnosis not present

## 2020-06-07 DIAGNOSIS — I4892 Unspecified atrial flutter: Secondary | ICD-10-CM | POA: Diagnosis not present

## 2020-06-07 LAB — POCT INR: INR: 1.4 — AB (ref 2.0–3.0)

## 2020-06-07 NOTE — Patient Instructions (Signed)
Take 2 tablets today only and then Take 1.5 tablets(5 mg) Daily except 1 tablet on Wednesday. Recheck INR in 1 week.   Cranberries, Antibiotics, and Steroids will raise INR (International Normalized Ratio)  Blood thickness or thinness. Diarrhea will also raise INR.

## 2020-06-10 NOTE — Patient Instructions (Addendum)
DUE TO COVID-19 ONLY ONE VISITOR IS ALLOWED TO COME WITH YOU AND STAY IN THE WAITING ROOM ONLY DURING PRE OP AND PROCEDURE DAY OF SURGERY. THE 1 VISITOR  MAY VISIT WITH YOU AFTER SURGERY IN YOUR PRIVATE ROOM DURING VISITING HOURS ONLY!  YOU NEED TO HAVE A COVID 19 TEST ON: 06/17/20 @ 8:40 AM , THIS TEST MUST BE DONE BEFORE SURGERY,  COVID TESTING SITE [place] [id number], IT IS ON THE RIGHT GOING OUT [address] APPROXIMATELY  2 MINUTES PAST [employer] ON THE RIGHT. ONCE YOUR COVID TEST IS COMPLETED,  PLEASE BEGIN THE QUARANTINE INSTRUCTIONS AS OUTLINED IN YOUR HANDOUT.                Nathan Grant    Your procedure is scheduled on: 06/20/20   Report to [facility] Main  Entrance   Report to admitting at: 10:20 AM    Call this number if you have problems the morning of surgery [phone]    Remember:  NO SOLID FOOD AFTER MIDNIGHT THE NIGHT PRIOR TO SURGERY. NOTHING BY MOUTH EXCEPT CLEAR LIQUIDS UNTIL: 9:50 AM . PLEASE FINISH ENSURE DRINK PER SURGEON ORDER  WHICH NEEDS TO BE COMPLETED AT: 9:50 AM .  CLEAR LIQUID DIET  Foods Allowed                                                                     Foods Excluded  Coffee and tea, regular and decaf                             liquids that you cannot  Plain Jell-O any favor except red or purple                                           see through such as: Fruit ices (not with fruit pulp)                                     milk, soups, orange juice  Iced Popsicles                                    All solid food Carbonated beverages, regular and diet                                    Cranberry, grape and apple juices Sports drinks like Gatorade Lightly seasoned clear broth or consume(fat free) Sugar, honey syrup  Sample Menu Breakfast                                Lunch  Supper Cranberry juice                    Beef broth                             Chicken broth Jell-O                                     Grape juice                           Apple juice Coffee or tea                        Jell-O                                      Popsicle                                                Coffee or tea                        Coffee or tea  _____________________________________________________________________   BRUSH YOUR TEETH MORNING OF SURGERY AND RINSE YOUR MOUTH OUT, NO CHEWING GUM CANDY OR MINTS.    Take these medicines the morning of surgery with A SIP OF WATER: amlodipine,metoprolol. Use inhalers and flonase as usual                               You may not have any metal on your body including hair pins and              piercings  Do not wear jewelry, lotions, powders or perfumes, deodorant             Men may shave face and neck.   Do not bring valuables to the hospital. [place].  Contacts, dentures or bridgework may not be worn into surgery.  Leave suitcase in the car. After surgery it may be brought to your room.     Patients discharged the day of surgery will not be allowed to drive home. IF YOU ARE HAVING SURGERY AND GOING HOME THE SAME DAY, YOU MUST HAVE AN ADULT TO DRIVE YOU HOME AND BE WITH YOU FOR 24 HOURS. YOU MAY GO HOME BY TAXI OR UBER OR ORTHERWISE, BUT AN ADULT MUST ACCOMPANY YOU HOME AND STAY WITH YOU FOR 24 HOURS.  Name and phone number of your driver:  Special Instructions: N/A              Please read over the following fact sheets you were given: _____________________________________________________________________          [facility] - Preparing for Surgery Before surgery, you can play an important role.  Because skin is not sterile, your skin needs to be as free of germs as possible.  You can reduce the number of germs on your  skin by washing with CHG (chlorahexidine gluconate) soap before surgery.  CHG is an antiseptic cleaner which kills germs and  bonds with the skin to continue killing germs even after washing. Please DO NOT use if you have an allergy to CHG or antibacterial soaps.  If your skin becomes reddened/irritated stop using the CHG and inform your nurse when you arrive at Short Stay. Do not shave (including legs and underarms) for at least 48 hours prior to the first CHG shower.  You may shave your face/neck. Please follow these instructions carefully:  1.  Shower with CHG Soap the night before surgery and the  morning of Surgery.  2.  If you choose to wash your hair, wash your hair first as usual with your  normal  shampoo.  3.  After you shampoo, rinse your hair and body thoroughly to remove the  shampoo.                           4.  Use CHG as you would any other liquid soap.  You can apply chg directly  to the skin and wash                       Gently with a scrungie or clean washcloth.  5.  Apply the CHG Soap to your body ONLY FROM THE NECK DOWN.   Do not use on face/ open                           Wound or open sores. Avoid contact with eyes, ears mouth and genitals (private parts).                       Wash face,  Genitals (private parts) with your normal soap.             6.  Wash thoroughly, paying special attention to the area where your surgery  will be performed.  7.  Thoroughly rinse your body with warm water from the neck down.  8.  DO NOT shower/wash with your normal soap after using and rinsing off  the CHG Soap.                9.  Pat yourself dry with a clean towel.            10.  Wear clean pajamas.            11.  Place clean sheets on your bed the night of your first shower and do not  sleep with pets. Day of Surgery : Do not apply any lotions/deodorants the morning of surgery.  Please wear clean clothes to the hospital/surgery center.  FAILURE TO FOLLOW THESE INSTRUCTIONS MAY RESULT IN THE CANCELLATION OF YOUR SURGERY PATIENT SIGNATURE_________________________________  NURSE  SIGNATURE__________________________________  ________________________________________________________________________ [facility]- Preparing for Total Shoulder Arthroplasty    Before surgery, you can play an important role. Because skin is not sterile, your skin needs to be as free of germs as possible. You can reduce the number of germs on your skin by using the following products. . Benzoyl Peroxide Gel o Reduces the number of germs present on the skin o Applied twice a day to shoulder area starting two days before surgery    ==================================================================  Please follow these instructions carefully:  BENZOYL PEROXIDE 5% GEL  Please do not use if you  have an allergy to benzoyl peroxide.   If your skin becomes reddened/irritated stop using the benzoyl peroxide.  Starting two days before surgery, apply as follows: 1. Apply benzoyl peroxide in the morning and at night. Apply after taking a shower. If you are not taking a shower clean entire shoulder front, back, and side along with the armpit with a clean wet washcloth.  2. Place a quarter-sized dollop on your shoulder and rub in thoroughly, making sure to cover the front, back, and side of your shoulder, along with the armpit.   2 days before ____ AM   ____ PM              1 day before ____ AM   ____ PM                         3. Do this twice a day for two days.  (Last application is the night before surgery, AFTER using the CHG soap as described below).  4. Do NOT apply benzoyl peroxide gel on the day of surgery.   Incentive Spirometer  An incentive spirometer is a tool that can help keep your lungs clear and active. This tool measures how well you are filling your lungs with each breath. Taking long deep breaths may help reverse or decrease the chance of developing breathing (pulmonary) problems (especially infection) following:  A long period of time when you are unable to move or be  active. BEFORE THE PROCEDURE   If the spirometer includes an indicator to show your best effort, your nurse or respiratory therapist will set it to a desired goal.  If possible, sit up straight or lean slightly forward. Try not to slouch.  Hold the incentive spirometer in an upright position. INSTRUCTIONS FOR USE  1. Sit on the edge of your bed if possible, or sit up as far as you can in bed or on a chair. 2. Hold the incentive spirometer in an upright position. 3. Breathe out normally. 4. Place the mouthpiece in your mouth and seal your lips tightly around it. 5. Breathe in slowly and as deeply as possible, raising the piston or the ball toward the top of the column. 6. Hold your breath for 3-5 seconds or for as long as possible. Allow the piston or ball to fall to the bottom of the column. 7. Remove the mouthpiece from your mouth and breathe out normally. 8. Rest for a few seconds and repeat Steps 1 through 7 at least 10 times every 1-2 hours when you are awake. Take your time and take a few normal breaths between deep breaths. 9. The spirometer may include an indicator to show your best effort. Use the indicator as a goal to work toward during each repetition. 10. After each set of 10 deep breaths, practice coughing to be sure your lungs are clear. If you have an incision (the cut made at the time of surgery), support your incision when coughing by placing a pillow or rolled up towels firmly against it. Once you are able to get out of bed, walk around indoors and cough well. You may stop using the incentive spirometer when instructed by your caregiver.  RISKS AND COMPLICATIONS  Take your time so you do not get dizzy or light-headed.  If you are in pain, you may need to take or ask for pain medication before doing incentive spirometry. It is harder to take a deep breath if you are  having pain. AFTER USE  Rest and breathe slowly and easily.  It can be helpful to keep track of a log of  your progress. Your caregiver can provide you with a simple table to help with this. If you are using the spirometer at home, follow these instructions: [place] IF:   You are having difficultly using the spirometer.  You have trouble using the spirometer as often as instructed.  Your pain medication is not giving enough relief while using the spirometer.  You develop fever of 100.5 F (38.1 C) or higher. SEEK IMMEDIATE MEDICAL CARE IF:   You cough up bloody sputum that had not been present before.  You develop fever of 102 F (38.9 C) or greater.  You develop worsening pain at or near the incision site. MAKE SURE YOU:   Understand these instructions.  Will watch your condition.  Will get help right away if you are not doing well or get worse. Document Released: 08/10/2006 Document Revised: 06/22/2011 Document Reviewed: 10/11/2006 [facility] Patient Information 2014 [place], [place].   ________________________________________________________________________

## 2020-06-11 ENCOUNTER — Encounter (HOSPITAL_COMMUNITY): Payer: Self-pay

## 2020-06-11 ENCOUNTER — Other Ambulatory Visit: Payer: Self-pay

## 2020-06-11 ENCOUNTER — Encounter (HOSPITAL_COMMUNITY)
Admission: RE | Admit: 2020-06-11 | Discharge: 2020-06-11 | Disposition: A | Discharge status: Home / Self Care | Payer: Medicare HMO | Source: Ambulatory Visit | Attending: Orthopedic Surgery | Admitting: Orthopedic Surgery

## 2020-06-11 DIAGNOSIS — Z79899 Other long term (current) drug therapy: Secondary | ICD-10-CM | POA: Diagnosis not present

## 2020-06-11 DIAGNOSIS — Z86711 Personal history of pulmonary embolism: Secondary | ICD-10-CM | POA: Insufficient documentation

## 2020-06-11 DIAGNOSIS — Z01812 Encounter for preprocedural laboratory examination: Secondary | ICD-10-CM | POA: Insufficient documentation

## 2020-06-11 DIAGNOSIS — I129 Hypertensive chronic kidney disease with stage 1 through stage 4 chronic kidney disease, or unspecified chronic kidney disease: Secondary | ICD-10-CM | POA: Diagnosis not present

## 2020-06-11 DIAGNOSIS — I4892 Unspecified atrial flutter: Secondary | ICD-10-CM | POA: Diagnosis not present

## 2020-06-11 DIAGNOSIS — M19011 Primary osteoarthritis, right shoulder: Secondary | ICD-10-CM | POA: Diagnosis not present

## 2020-06-11 DIAGNOSIS — Z8616 Personal history of COVID-19: Secondary | ICD-10-CM | POA: Diagnosis not present

## 2020-06-11 DIAGNOSIS — N184 Chronic kidney disease, stage 4 (severe): Secondary | ICD-10-CM | POA: Diagnosis not present

## 2020-06-11 DIAGNOSIS — Z7901 Long term (current) use of anticoagulants: Secondary | ICD-10-CM | POA: Insufficient documentation

## 2020-06-11 DIAGNOSIS — Z86718 Personal history of other venous thrombosis and embolism: Secondary | ICD-10-CM | POA: Diagnosis not present

## 2020-06-11 HISTORY — DX: Ventricular tachycardia: I47.2

## 2020-06-11 HISTORY — DX: Ventricular tachycardia, unspecified: I47.20

## 2020-06-11 HISTORY — DX: Cardiac arrhythmia, unspecified: I49.9

## 2020-06-11 LAB — BASIC METABOLIC PANEL
Anion gap: 6 (ref 5–15)
BUN: 25 mg/dL — ABNORMAL HIGH (ref 8–23)
CO2: 31 mmol/L (ref 22–32)
Calcium: 9 mg/dL (ref 8.9–10.3)
Chloride: 105 mmol/L (ref 98–111)
Creatinine, Ser: 1.98 mg/dL — ABNORMAL HIGH (ref 0.61–1.24)
GFR, Estimated: 35 mL/min — ABNORMAL LOW (ref >60)
Glucose, Bld: 108 mg/dL — ABNORMAL HIGH (ref 70–99)
Potassium: 4.8 mmol/L (ref 3.5–5.1)
Sodium: 142 mmol/L (ref 135–145)

## 2020-06-11 LAB — CBC
HCT: 38.5 % — ABNORMAL LOW (ref 39.0–52.0)
Hemoglobin: 11.7 g/dL — ABNORMAL LOW (ref 13.0–17.0)
MCH: 23 pg — ABNORMAL LOW (ref 26.0–34.0)
MCHC: 30.4 g/dL (ref 30.0–36.0)
MCV: 75.8 fL — ABNORMAL LOW (ref 80.0–100.0)
Platelets: 289 10*3/uL (ref 150–400)
RBC: 5.08 MIL/uL (ref 4.22–5.81)
RDW: 16 % — ABNORMAL HIGH (ref 11.5–15.5)
WBC: 4.2 10*3/uL (ref 4.0–10.5)
nRBC: 0 % (ref 0.0–0.2)

## 2020-06-11 LAB — SURGICAL PCR SCREEN
MRSA, PCR: NEGATIVE
Staphylococcus aureus: NEGATIVE

## 2020-06-11 NOTE — Progress Notes (Signed)
COVID Vaccine Completed: Yes Date COVID Vaccine completed: 03/25/20 [place] COVID vaccine manufacturer: Pfizer      PCP - Dr. Delia Chimes. LOV: 03/18/20 Cardiologist -  Dr. Peter Martinique. LOV: 05/03/20. Next app. 06/17/20  Chest x-ray -  EKG - 05/03/20 Stress Test -  ECHO - 05/21/20 Cardiac Cath -  Pacemaker/ICD device last checked:  Sleep Study -  CPAP -   Fasting Blood Sugar -  Checks Blood Sugar _____ times a day  Blood Thinner Instructions: Pt. Does not have instructions yet about Coumadin,he has an appointment on 06/14/20 at the Coumadin clinic. Aspirin Instructions: Last Dose:  Anesthesia review: Hx: HTN,A flutter,Afib,Bradycardia,VT.  Patient denies shortness of breath, fever, cough and chest pain at PAT appointment   Patient verbalized understanding of instructions that were given to them at the PAT appointment. Patient was also instructed that they will need to review over the PAT instructions again at home before surgery.

## 2020-06-11 NOTE — Progress Notes (Signed)
Lab. Results: Creatinine: 1.6

## 2020-06-12 NOTE — Progress Notes (Signed)
Anesthesia Chart Review   Case: [id number] Date/Time: 06/20/20 1233   Procedure: REVERSE SHOULDER ARTHROPLASTY (Right Shoulder) - 1110min   Anesthesia type: General   Pre-op diagnosis: Right shoulder osteoarthritis   Location: [facility] ROOM 06 / [facility]   Surgeons: Justice Britain, MD      DISCUSSION:74 y.o. never smoker with h/o HTN, atrial flutter s/p DCCV recurrent 04/2019 (Coumadin), bilateral DVT and PE in setting of COVID 04/2019,  CKD Stage IV, right shoulder OA scheduled for above procedure 06/20/2020 with Dr. Justice Britain.   Pt last seen by cardiologist 05/03/2020. Per OV note BP well controlled, volume status stable, atrial flutter is asymptomatic and rate controlled. Echo ordered at this visit to evaluate pulmonary HTN. Per Dr. Martinique, "If Echo looks OK will clear for surgery. Can interrupt Coumadin for 5 days for surgery."  Echo 05/21/2020. Per Dr. Martinique, "Echo shows severe LVH with normal EF. Moderate biatrial enlargement. Compared to Echo done one year ago RV function has improved and no pulmonary HTN noted"  Anticipate pt can proceed with planned procedure barring acute status change.   VS: BP (!) 166/92   Pulse 70   Temp 37.3 C (Oral)   Ht $R'6\' 1"'tr$  (1.854 m)   Wt (!) 150.1 kg   SpO2 99%   BMI 43.67 kg/m   PROVIDERS: Forrest Moron, MD is PCP   Martinique, Peter, MD is Cardiologist  LABS: creatinine stable (all labs ordered are listed, but only abnormal results are displayed)  Labs Reviewed  CBC - Abnormal; Notable for the following components:      Result Value   Hemoglobin 11.7 (*)    HCT 38.5 (*)    MCV 75.8 (*)    MCH 23.0 (*)    RDW 16.0 (*)    All other components within normal limits  BASIC METABOLIC PANEL - Abnormal; Notable for the following components:   Glucose, Bld 108 (*)    BUN 25 (*)    Creatinine, Ser 1.98 (*)    GFR, Estimated 35 (*)    All other components within normal limits  SURGICAL PCR SCREEN     IMAGES:   EKG: 05/03/2020 Rate 82 bpm  Atrial  flutter with 3:1 AV conduction T wave abnormality, consider inferolateral ischemia Prolonged QT  CV: Echo 05/21/2020 IMPRESSIONS    1. Left ventricular ejection fraction, by estimation, is 60 to 65%. The  left ventricle has normal function. The left ventricle has no regional  wall motion abnormalities. There is severe left ventricular hypertrophy.  Left ventricular diastolic function  could not be evaluated.  2. Right ventricular systolic function is normal. The right ventricular  size is normal.  3. Left atrial size was moderately dilated.  4. Right atrial size was moderately dilated.  5. The pericardial effusion is posterior to the left ventricle.  6. The mitral valve is abnormal. Mild mitral valve regurgitation.  7. The aortic valve is tricuspid. Aortic valve regurgitation is not  visualized. Mild aortic valve sclerosis is present, with no evidence of  aortic valve stenosis.  8. Aortic dilatation noted. There is mild dilatation of the ascending  aorta, measuring 39 mm.  9. Cannot exclude a small PFO.   Stress Test 05/31/2012 Impression:  No evidence of ischemia  The normal left ventricular systolic function by visual inspection.   Past Medical History:  Diagnosis Date  . Atrial flutter (Echo)    s/p cardioversion 06/2010  . Bradycardia    a. Accelerated junctional & sinus bradycardia  during 01/2013 adm.  . Bradycardia   . Chronic headaches   . Degenerative joint disease     End-stage degenerative joint disease, left knee  . Dysrhythmia    Afib/A fltter  . Erectile dysfunction   . HTN (hypertension)   . Hypertensive heart disease    a. Admit for CP 01/2013 felt due to this. Cath with normal coronary anatomy.  . Microcytic anemia  03/22/2001  . Morbid obesity ([place])   . NSVT (nonsustained ventricular tachycardia) ([place])    a. During 01/2013 adm.  . Obesity   . Osteoarthritis   . Tendonitis   . Tobacco chew use     History of chewing tobacco usage.   .  Urinary tract infection     Pseudomonas urinary tract infection  . VT (ventricular tachycardia) ([place])     Past Surgical History:  Procedure Laterality Date  . CARDIOVERSION  07/01/2010  . LEFT HEART CATHETERIZATION WITH CORONARY ANGIOGRAM N/A 01/16/2013   Procedure: LEFT HEART CATHETERIZATION WITH CORONARY ANGIOGRAM;  Surgeon: Peter M Martinique, MD;  Location: [facility] CATH LAB;  Service: Cardiovascular;  Laterality: N/A;  . TOTAL KNEE ARTHROPLASTY  May 2009   Bilateral  . VASECTOMY      MEDICATIONS: . albuterol (VENTOLIN HFA) 108 (90 Base) MCG/ACT inhaler  . amLODipine (NORVASC) 10 MG tablet  . Ascorbic Acid (VITAMIN C) 1000 MG tablet  . blood glucose meter kit and supplies  . Blood Glucose Monitoring Suppl (ACCU-CHEK GUIDE ME) w/Device KIT  . ferrous sulfate 324 (65 Fe) MG TBEC  . fluticasone (FLONASE) 50 MCG/ACT nasal spray  . furosemide (LASIX) 40 MG tablet  . guaiFENesin (MUCINEX) 600 MG 12 hr tablet  . lidocaine (LIDODERM) 5 %  . losartan (COZAAR) 100 MG tablet  . metoprolol succinate (TOPROL-XL) 25 MG 24 hr tablet  . oxycodone (OXY-IR) 5 MG capsule  . pravastatin (PRAVACHOL) 40 MG tablet  . spironolactone (ALDACTONE) 25 MG tablet  . warfarin (COUMADIN) 5 MG tablet   No current facility-administered medications for this encounter.   Konrad Felix, PA-C [facility] [phone]

## 2020-06-14 ENCOUNTER — Ambulatory Visit (INDEPENDENT_AMBULATORY_CARE_PROVIDER_SITE_OTHER): Payer: Medicare HMO

## 2020-06-14 ENCOUNTER — Other Ambulatory Visit: Payer: Self-pay

## 2020-06-14 DIAGNOSIS — I4892 Unspecified atrial flutter: Secondary | ICD-10-CM

## 2020-06-14 DIAGNOSIS — Z7901 Long term (current) use of anticoagulants: Secondary | ICD-10-CM

## 2020-06-14 LAB — POCT INR: INR: 2 (ref 2.0–3.0)

## 2020-06-14 NOTE — Patient Instructions (Signed)
Continue taking 1.5 tablets(5 mg) Daily except 1 tablet on Wednesday. Recheck INR in 2 week.  Hold Coumadin 5 days (Saturday, Sunday, Monday, Tuesday and Wednesday.  Resume on Thursday evening, unless otherwise told by surgeon   Cranberries, Antibiotics, and Steroids will raise INR (International Normalized Ratio)  Blood thickness or thinness. Diarrhea will also raise INR.

## 2020-06-17 ENCOUNTER — Other Ambulatory Visit (HOSPITAL_COMMUNITY)
Admission: RE | Admit: 2020-06-17 | Discharge: 2020-06-17 | Disposition: A | Discharge status: Home / Self Care | Payer: Medicare HMO | Source: Ambulatory Visit | Attending: Orthopedic Surgery | Admitting: Orthopedic Surgery

## 2020-06-17 DIAGNOSIS — Z01812 Encounter for preprocedural laboratory examination: Secondary | ICD-10-CM | POA: Diagnosis not present

## 2020-06-17 DIAGNOSIS — Z20822 Contact with and (suspected) exposure to covid-19: Secondary | ICD-10-CM | POA: Diagnosis not present

## 2020-06-17 LAB — SARS CORONAVIRUS 2 (TAT 6-24 HRS): SARS Coronavirus 2: NEGATIVE

## 2020-06-20 ENCOUNTER — Ambulatory Visit (HOSPITAL_COMMUNITY)
Admission: RE | Admit: 2020-06-20 | Discharge: 2020-06-21 | Disposition: A | Discharge status: Home / Self Care | Payer: Medicare HMO | Source: Ambulatory Visit | Attending: Orthopedic Surgery | Admitting: Orthopedic Surgery

## 2020-06-20 ENCOUNTER — Ambulatory Visit (HOSPITAL_COMMUNITY): Payer: Medicare HMO | Admitting: Anesthesiology

## 2020-06-20 ENCOUNTER — Encounter (HOSPITAL_COMMUNITY)
Admission: RE | Disposition: A | Discharge status: Home / Self Care | Payer: Self-pay | Source: Ambulatory Visit | Attending: Orthopedic Surgery

## 2020-06-20 ENCOUNTER — Ambulatory Visit (HOSPITAL_COMMUNITY): Payer: Medicare HMO | Admitting: Physician Assistant

## 2020-06-20 ENCOUNTER — Other Ambulatory Visit: Payer: Self-pay

## 2020-06-20 ENCOUNTER — Encounter (HOSPITAL_COMMUNITY): Payer: Self-pay | Admitting: Orthopedic Surgery

## 2020-06-20 DIAGNOSIS — Z811 Family history of alcohol abuse and dependence: Secondary | ICD-10-CM | POA: Insufficient documentation

## 2020-06-20 DIAGNOSIS — Z6841 Body Mass Index (BMI) 40.0 and over, adult: Secondary | ICD-10-CM | POA: Diagnosis not present

## 2020-06-20 DIAGNOSIS — I5032 Chronic diastolic (congestive) heart failure: Secondary | ICD-10-CM | POA: Diagnosis not present

## 2020-06-20 DIAGNOSIS — M19011 Primary osteoarthritis, right shoulder: Secondary | ICD-10-CM | POA: Diagnosis not present

## 2020-06-20 DIAGNOSIS — M25511 Pain in right shoulder: Secondary | ICD-10-CM

## 2020-06-20 DIAGNOSIS — M25711 Osteophyte, right shoulder: Secondary | ICD-10-CM | POA: Insufficient documentation

## 2020-06-20 DIAGNOSIS — R69 Illness, unspecified: Secondary | ICD-10-CM | POA: Diagnosis not present

## 2020-06-20 DIAGNOSIS — Z96611 Presence of right artificial shoulder joint: Secondary | ICD-10-CM

## 2020-06-20 DIAGNOSIS — Z7901 Long term (current) use of anticoagulants: Secondary | ICD-10-CM | POA: Insufficient documentation

## 2020-06-20 DIAGNOSIS — E1122 Type 2 diabetes mellitus with diabetic chronic kidney disease: Secondary | ICD-10-CM | POA: Diagnosis not present

## 2020-06-20 DIAGNOSIS — I130 Hypertensive heart and chronic kidney disease with heart failure and stage 1 through stage 4 chronic kidney disease, or unspecified chronic kidney disease: Secondary | ICD-10-CM | POA: Diagnosis not present

## 2020-06-20 DIAGNOSIS — Z79899 Other long term (current) drug therapy: Secondary | ICD-10-CM | POA: Diagnosis not present

## 2020-06-20 DIAGNOSIS — Z8616 Personal history of COVID-19: Secondary | ICD-10-CM | POA: Diagnosis not present

## 2020-06-20 DIAGNOSIS — G8918 Other acute postprocedural pain: Secondary | ICD-10-CM | POA: Diagnosis not present

## 2020-06-20 DIAGNOSIS — N183 Chronic kidney disease, stage 3 unspecified: Secondary | ICD-10-CM | POA: Diagnosis not present

## 2020-06-20 DIAGNOSIS — Z8249 Family history of ischemic heart disease and other diseases of the circulatory system: Secondary | ICD-10-CM | POA: Diagnosis not present

## 2020-06-20 DIAGNOSIS — I4891 Unspecified atrial fibrillation: Secondary | ICD-10-CM | POA: Diagnosis not present

## 2020-06-20 HISTORY — PX: REVERSE SHOULDER ARTHROPLASTY: SHX5054

## 2020-06-20 LAB — PROTIME-INR
INR: 1.3 — ABNORMAL HIGH (ref 0.8–1.2)
Prothrombin Time: 15.4 seconds — ABNORMAL HIGH (ref 11.4–15.2)

## 2020-06-20 LAB — GLUCOSE, CAPILLARY
Glucose-Capillary: 81 mg/dL (ref 70–99)
Glucose-Capillary: 143 mg/dL — ABNORMAL HIGH (ref 70–99)

## 2020-06-20 SURGERY — ARTHROPLASTY, SHOULDER, TOTAL, REVERSE
Anesthesia: General | Site: Shoulder | Laterality: Right

## 2020-06-20 MED ORDER — OXYCODONE HCL 5 MG PO TABS: 5.0000 mg | ORAL_TABLET | Freq: Once | ORAL | Status: DC | PRN

## 2020-06-20 MED ORDER — LIDOCAINE 2% (20 MG/ML) 5 ML SYRINGE
INTRAMUSCULAR | Status: AC
  Filled 2020-06-20: qty 5

## 2020-06-20 MED ORDER — FENTANYL CITRATE (PF) 100 MCG/2ML IJ SOLN
INTRAMUSCULAR | Status: DC | PRN
  Administered 2020-06-20: 100 ug via INTRAVENOUS

## 2020-06-20 MED ORDER — CHLORHEXIDINE GLUCONATE 0.12 % MT SOLN
15.0000 mL | Freq: Once | OROMUCOSAL | Status: AC
  Administered 2020-06-20: 15 mL via OROMUCOSAL

## 2020-06-20 MED ORDER — OXYCODONE HCL 5 MG/5ML PO SOLN: 5.0000 mg | Freq: Once | ORAL | Status: DC | PRN

## 2020-06-20 MED ORDER — OXYCODONE HCL 5 MG PO CAPS: 5.0000 mg | ORAL_CAPSULE | Freq: Every evening | ORAL | 0 refills | Status: AC | PRN

## 2020-06-20 MED ORDER — PANTOPRAZOLE SODIUM 40 MG PO TBEC
40.0000 mg | DELAYED_RELEASE_TABLET | Freq: Every day | ORAL | Status: DC
  Administered 2020-06-20: 40 mg via ORAL
  Filled 2020-06-20: qty 1

## 2020-06-20 MED ORDER — PROPOFOL 10 MG/ML IV BOLUS
INTRAVENOUS | Status: DC | PRN
  Administered 2020-06-20: 70 mg via INTRAVENOUS
  Administered 2020-06-20: 200 mg via INTRAVENOUS
  Administered 2020-06-20: 30 mg via INTRAVENOUS

## 2020-06-20 MED ORDER — ONDANSETRON HCL 4 MG/2ML IJ SOLN
INTRAMUSCULAR | Status: AC
  Filled 2020-06-20: qty 2

## 2020-06-20 MED ORDER — DIPHENHYDRAMINE HCL 12.5 MG/5ML PO ELIX: 12.5000 mg | ORAL_SOLUTION | ORAL | Status: DC | PRN

## 2020-06-20 MED ORDER — PROPOFOL 10 MG/ML IV BOLUS
INTRAVENOUS | Status: AC
  Filled 2020-06-20: qty 20

## 2020-06-20 MED ORDER — ACETAMINOPHEN 325 MG PO TABS: 325.0000 mg | ORAL_TABLET | Freq: Four times a day (QID) | ORAL | Status: DC | PRN

## 2020-06-20 MED ORDER — MAGNESIUM CITRATE PO SOLN: 1.0000 | Freq: Once | ORAL | Status: DC | PRN

## 2020-06-20 MED ORDER — FENTANYL CITRATE (PF) 100 MCG/2ML IJ SOLN
INTRAMUSCULAR | Status: AC
  Filled 2020-06-20: qty 2

## 2020-06-20 MED ORDER — SODIUM CHLORIDE 0.9 % IR SOLN
Status: DC | PRN
  Administered 2020-06-20: 1000 mL

## 2020-06-20 MED ORDER — ROCURONIUM BROMIDE 10 MG/ML (PF) SYRINGE
PREFILLED_SYRINGE | INTRAVENOUS | Status: DC | PRN
  Administered 2020-06-20: 80 mg via INTRAVENOUS

## 2020-06-20 MED ORDER — HYDROMORPHONE HCL 1 MG/ML IJ SOLN: 0.5000 mg | INTRAMUSCULAR | Status: DC | PRN

## 2020-06-20 MED ORDER — ROCURONIUM BROMIDE 10 MG/ML (PF) SYRINGE
PREFILLED_SYRINGE | INTRAVENOUS | Status: AC
  Filled 2020-06-20: qty 10

## 2020-06-20 MED ORDER — PHENOL 1.4 % MT LIQD: 1.0000 | OROMUCOSAL | Status: DC | PRN

## 2020-06-20 MED ORDER — STERILE WATER FOR IRRIGATION IR SOLN
Status: DC | PRN
  Administered 2020-06-20: 2000 mL

## 2020-06-20 MED ORDER — ONDANSETRON HCL 4 MG PO TABS: 4.0000 mg | ORAL_TABLET | Freq: Three times a day (TID) | ORAL | 0 refills | Status: AC | PRN

## 2020-06-20 MED ORDER — MIDAZOLAM HCL 2 MG/2ML IJ SOLN
1.0000 mg | INTRAMUSCULAR | Status: DC
  Filled 2020-06-20: qty 2

## 2020-06-20 MED ORDER — METHOCARBAMOL 500 MG IVPB - SIMPLE MED
500.0000 mg | Freq: Four times a day (QID) | INTRAVENOUS | Status: DC | PRN
  Filled 2020-06-20: qty 50

## 2020-06-20 MED ORDER — MENTHOL 3 MG MT LOZG: 1.0000 | LOZENGE | OROMUCOSAL | Status: DC | PRN

## 2020-06-20 MED ORDER — FENTANYL CITRATE (PF) 100 MCG/2ML IJ SOLN: 25.0000 ug | INTRAMUSCULAR | Status: DC | PRN

## 2020-06-20 MED ORDER — BISACODYL 5 MG PO TBEC: 5.0000 mg | DELAYED_RELEASE_TABLET | Freq: Every day | ORAL | Status: DC | PRN

## 2020-06-20 MED ORDER — POLYETHYLENE GLYCOL 3350 17 G PO PACK
17.0000 g | PACK | Freq: Every day | ORAL | Status: DC | PRN
Start: 2020-06-20 — End: 2020-06-21

## 2020-06-20 MED ORDER — OXYCODONE HCL 5 MG PO TABS
5.0000 mg | ORAL_TABLET | ORAL | Status: DC | PRN
  Filled 2020-06-20: qty 1

## 2020-06-20 MED ORDER — METHOCARBAMOL 500 MG PO TABS
500.0000 mg | ORAL_TABLET | Freq: Four times a day (QID) | ORAL | Status: DC | PRN
  Filled 2020-06-20: qty 1

## 2020-06-20 MED ORDER — SUGAMMADEX SODIUM 200 MG/2ML IV SOLN
INTRAVENOUS | Status: DC | PRN
  Administered 2020-06-20: 300 mg via INTRAVENOUS

## 2020-06-20 MED ORDER — BUPIVACAINE-EPINEPHRINE (PF) 0.5% -1:200000 IJ SOLN
INTRAMUSCULAR | Status: DC | PRN
  Administered 2020-06-20: 20 mL via PERINEURAL

## 2020-06-20 MED ORDER — FENTANYL CITRATE (PF) 100 MCG/2ML IJ SOLN
50.0000 ug | Freq: Once | INTRAMUSCULAR | Status: AC
  Administered 2020-06-20: 100 ug via INTRAVENOUS
  Filled 2020-06-20: qty 2

## 2020-06-20 MED ORDER — ONDANSETRON HCL 4 MG/2ML IJ SOLN
INTRAMUSCULAR | Status: DC | PRN
  Administered 2020-06-20: 4 mg via INTRAVENOUS

## 2020-06-20 MED ORDER — ALUM & MAG HYDROXIDE-SIMETH 200-200-20 MG/5ML PO SUSP: 30.0000 mL | ORAL | Status: DC | PRN

## 2020-06-20 MED ORDER — METOCLOPRAMIDE HCL 5 MG/ML IJ SOLN: 5.0000 mg | Freq: Three times a day (TID) | INTRAMUSCULAR | Status: DC | PRN

## 2020-06-20 MED ORDER — ONDANSETRON HCL 4 MG/2ML IJ SOLN: 4.0000 mg | Freq: Four times a day (QID) | INTRAMUSCULAR | Status: DC | PRN

## 2020-06-20 MED ORDER — ORAL CARE MOUTH RINSE: 15.0000 mL | Freq: Once | OROMUCOSAL | Status: AC

## 2020-06-20 MED ORDER — DEXAMETHASONE SODIUM PHOSPHATE 10 MG/ML IJ SOLN
INTRAMUSCULAR | Status: AC
  Filled 2020-06-20: qty 1

## 2020-06-20 MED ORDER — INSULIN ASPART 100 UNIT/ML ~~LOC~~ SOLN
0.0000 [IU] | Freq: Three times a day (TID) | SUBCUTANEOUS | Status: DC
  Administered 2020-06-21: 2 [IU] via SUBCUTANEOUS

## 2020-06-20 MED ORDER — LIDOCAINE HCL (CARDIAC) PF 100 MG/5ML IV SOSY
PREFILLED_SYRINGE | INTRAVENOUS | Status: DC | PRN
  Administered 2020-06-20: 20 mg via INTRAVENOUS

## 2020-06-20 MED ORDER — DEXAMETHASONE SODIUM PHOSPHATE 10 MG/ML IJ SOLN
INTRAMUSCULAR | Status: DC | PRN
  Administered 2020-06-20: 8 mg via INTRAVENOUS

## 2020-06-20 MED ORDER — TRANEXAMIC ACID-NACL 1000-0.7 MG/100ML-% IV SOLN
1000.0000 mg | INTRAVENOUS | Status: AC
  Administered 2020-06-20: 1000 mg via INTRAVENOUS
  Filled 2020-06-20: qty 100

## 2020-06-20 MED ORDER — METOCLOPRAMIDE HCL 5 MG PO TABS: 5.0000 mg | ORAL_TABLET | Freq: Three times a day (TID) | ORAL | Status: DC | PRN

## 2020-06-20 MED ORDER — ONDANSETRON HCL 4 MG PO TABS: 4.0000 mg | ORAL_TABLET | Freq: Four times a day (QID) | ORAL | Status: DC | PRN

## 2020-06-20 MED ORDER — LACTATED RINGERS IV SOLN: INTRAVENOUS | Status: DC

## 2020-06-20 MED ORDER — PHENYLEPHRINE HCL (PRESSORS) 10 MG/ML IV SOLN
INTRAVENOUS | Status: AC
  Filled 2020-06-20: qty 1

## 2020-06-20 MED ORDER — SUGAMMADEX SODIUM 500 MG/5ML IV SOLN
INTRAVENOUS | Status: AC
  Filled 2020-06-20: qty 5

## 2020-06-20 MED ORDER — ONDANSETRON HCL 4 MG/2ML IJ SOLN: 4.0000 mg | Freq: Once | INTRAMUSCULAR | Status: DC | PRN

## 2020-06-20 MED ORDER — PHENYLEPHRINE HCL-NACL 10-0.9 MG/250ML-% IV SOLN
INTRAVENOUS | Status: DC | PRN
  Administered 2020-06-20: 40 ug/min via INTRAVENOUS

## 2020-06-20 MED ORDER — OXYCODONE HCL 5 MG PO TABS: 10.0000 mg | ORAL_TABLET | ORAL | Status: DC | PRN

## 2020-06-20 MED ORDER — BUPIVACAINE LIPOSOME 1.3 % IJ SUSP
INTRAMUSCULAR | Status: DC | PRN
  Administered 2020-06-20: 10 mL via PERINEURAL

## 2020-06-20 MED ORDER — DEXTROSE 5 % IV SOLN
3.0000 g | INTRAVENOUS | Status: AC
  Administered 2020-06-20: 3 g via INTRAVENOUS
  Filled 2020-06-20: qty 3

## 2020-06-20 MED ORDER — VANCOMYCIN HCL 1000 MG IV SOLR
INTRAVENOUS | Status: DC | PRN
  Administered 2020-06-20: 1000 mg

## 2020-06-20 MED ORDER — DOCUSATE SODIUM 100 MG PO CAPS
100.0000 mg | ORAL_CAPSULE | Freq: Two times a day (BID) | ORAL | Status: DC
  Administered 2020-06-20: 100 mg via ORAL
  Filled 2020-06-20: qty 1

## 2020-06-20 MED ORDER — VANCOMYCIN HCL 1000 MG IV SOLR
INTRAVENOUS | Status: AC
  Filled 2020-06-20: qty 1000

## 2020-06-20 MED ORDER — CYCLOBENZAPRINE HCL 10 MG PO TABS: 10.0000 mg | ORAL_TABLET | Freq: Three times a day (TID) | ORAL | 1 refills | Status: AC | PRN

## 2020-06-20 SURGICAL SUPPLY — 65 items
BAG ZIPLOCK 12X15 (MISCELLANEOUS) ×2 IMPLANT
BLADE SAW SGTL 83.5X18.5 (BLADE) ×2 IMPLANT
COOLER ICEMAN CLASSIC (MISCELLANEOUS) IMPLANT
COVER BACK TABLE 60X90IN (DRAPES) ×2 IMPLANT
COVER SURGICAL LIGHT HANDLE (MISCELLANEOUS) ×2 IMPLANT
COVER WAND RF STERILE (DRAPES) IMPLANT
CUP SUT UNIV REVERS 39 NEU (Shoulder) ×2 IMPLANT
DERMABOND ADVANCED (GAUZE/BANDAGES/DRESSINGS) ×1
DERMABOND ADVANCED .7 DNX12 (GAUZE/BANDAGES/DRESSINGS) ×1 IMPLANT
DRAPE INCISE IOBAN 66X45 STRL (DRAPES) IMPLANT
DRAPE ORTHO SPLIT 77X108 STRL (DRAPES) ×4
DRAPE SHEET LG 3/4 BI-LAMINATE (DRAPES) ×2 IMPLANT
DRAPE SURG 17X11 SM STRL (DRAPES) ×2 IMPLANT
DRAPE SURG ORHT 6 SPLT 77X108 (DRAPES) ×2 IMPLANT
DRAPE U-SHAPE 47X51 STRL (DRAPES) ×2 IMPLANT
DRSG AQUACEL AG ADV 3.5X10 (GAUZE/BANDAGES/DRESSINGS) ×2 IMPLANT
DURAPREP 26ML APPLICATOR (WOUND CARE) ×2 IMPLANT
ELECT BLADE TIP CTD 4 INCH (ELECTRODE) ×2 IMPLANT
ELECT REM PT RETURN 15FT ADLT (MISCELLANEOUS) ×2 IMPLANT
FACESHIELD WRAPAROUND (MASK) ×8 IMPLANT
GLENOID UNI REV MOD 24 +2 LAT (Joint) ×2 IMPLANT
GLENOSPHERE 39+4 LAT/24 UNI RV (Joint) ×2 IMPLANT
GLOVE SS BIOGEL STRL SZ 7.5 (GLOVE) ×1 IMPLANT
GLOVE SUPERSENSE BIOGEL SZ 7.5 (GLOVE) ×1
GLOVE SURG ENC MOIS LTX SZ7.5 (GLOVE) ×2 IMPLANT
GLOVE SURG ENC MOIS LTX SZ8 (GLOVE) ×2 IMPLANT
GLOVE SURG MICRO LTX SZ7 (GLOVE) ×2 IMPLANT
GLOVE SURG SYN 7.0 (GLOVE) IMPLANT
GLOVE SURG SYN 7.5  E (GLOVE)
GLOVE SURG SYN 7.5 E (GLOVE) IMPLANT
GLOVE SURG SYN 8.0 (GLOVE) IMPLANT
GOWN STRL REUS W/TWL LRG LVL3 (GOWN DISPOSABLE) ×4 IMPLANT
INSERT HUMERAL 39/+6 (Insert) ×2 IMPLANT
KIT BASIN OR (CUSTOM PROCEDURE TRAY) ×2 IMPLANT
KIT TURNOVER KIT A (KITS) ×2 IMPLANT
MANIFOLD NEPTUNE II (INSTRUMENTS) ×2 IMPLANT
NEEDLE TAPERED W/ NITINOL LOOP (MISCELLANEOUS) ×2 IMPLANT
NS IRRIG 1000ML POUR BTL (IV SOLUTION) ×2 IMPLANT
PACK SHOULDER (CUSTOM PROCEDURE TRAY) ×2 IMPLANT
PAD ARMBOARD 7.5X6 YLW CONV (MISCELLANEOUS) ×2 IMPLANT
PAD COLD SHLDR WRAP-ON (PAD) ×2 IMPLANT
PIN NITINOL TARGETER 2.8 (PIN) IMPLANT
PIN SET MODULAR GLENOID SYSTEM (PIN) IMPLANT
RESTRAINT HEAD UNIVERSAL NS (MISCELLANEOUS) ×2 IMPLANT
SCREW CENTRAL MOD 35 (Screw) ×2 IMPLANT
SCREW PERI LOCK 5.5X24 (Screw) ×4 IMPLANT
SCREW PERIPHERAL 5.5X20 LOCK (Screw) ×2 IMPLANT
SCREW PERIPHERAL 5.5X28 LOCK (Screw) ×2 IMPLANT
SLING ARM FOAM STRAP LRG (SOFTGOODS) IMPLANT
SLING ARM FOAM STRAP MED (SOFTGOODS) IMPLANT
SLING ARM FOAM STRAP XLG (SOFTGOODS) ×2 IMPLANT
SPONGE LAP 18X18 RF (DISPOSABLE) IMPLANT
STEM HUMERAL UNI REVERS SZ6 (Stem) ×2 IMPLANT
SUCTION FRAZIER HANDLE 12FR (TUBING) ×2
SUCTION TUBE FRAZIER 12FR DISP (TUBING) ×1 IMPLANT
SUT FIBERWIRE #2 38 T-5 BLUE (SUTURE)
SUT MNCRL AB 3-0 PS2 18 (SUTURE) ×2 IMPLANT
SUT MON AB 2-0 CT1 36 (SUTURE) ×2 IMPLANT
SUT VIC AB 1 CT1 36 (SUTURE) ×2 IMPLANT
SUTURE FIBERWR #2 38 T-5 BLUE (SUTURE) IMPLANT
SUTURE TAPE 1.3 40 TPR END (SUTURE) ×2 IMPLANT
SUTURETAPE 1.3 40 TPR END (SUTURE) ×4
TOWEL OR 17X26 10 PK STRL BLUE (TOWEL DISPOSABLE) ×2 IMPLANT
TOWEL OR NON WOVEN STRL DISP B (DISPOSABLE) ×2 IMPLANT
WATER STERILE IRR 1000ML POUR (IV SOLUTION) ×4 IMPLANT

## 2020-06-20 NOTE — H&P (Signed)
Nathan Grant    Chief Complaint: Right shoulder osteoarthritis HPI: The patient is a 75 y.o. male with severe chronic right shoulder pain related to osteoarthritis and associated rotator cuff dysfunction.  Due to his increasing functional imitations and failure to respond to prolonged attempts at conservative management he is brought to the operating room at this time for planned right shoulder reverse arthroplasty.  Patient also has a known complex and extensive past medical history with multiple comorbidities.  These include chronic anticoagulation for atrial fibrillation.  He was evaluated by his primary care physician and cardiologist preop and appropriate arrangements have been made to optimize his multiple medical comorbidities.  Past Medical History:  Diagnosis Date  . Atrial flutter ([place])    s/p cardioversion 06/2010  . Bradycardia    a. Accelerated junctional & sinus bradycardia during 01/2013 adm.  . Bradycardia   . Chronic headaches   . Degenerative joint disease     End-stage degenerative joint disease, left knee  . Dysrhythmia    Afib/A fltter  . Erectile dysfunction   . HTN (hypertension)   . Hypertensive heart disease    a. Admit for CP 01/2013 felt due to this. Cath with normal coronary anatomy.  . Microcytic anemia  03/22/2001  . Morbid obesity ([place])   . NSVT (nonsustained ventricular tachycardia) ([place])    a. During 01/2013 adm.  . Obesity   . Osteoarthritis   . Tendonitis   . Tobacco chew use     History of chewing tobacco usage.   . Urinary tract infection     Pseudomonas urinary tract infection  . VT (ventricular tachycardia) ([place])     Past Surgical History:  Procedure Laterality Date  . CARDIOVERSION  07/01/2010  . LEFT HEART CATHETERIZATION WITH CORONARY ANGIOGRAM N/A 01/16/2013   Procedure: LEFT HEART CATHETERIZATION WITH CORONARY ANGIOGRAM;  Surgeon: Peter M Martinique, MD;  Location: [facility] CATH LAB;  Service: Cardiovascular;  Laterality: N/A;  . TOTAL KNEE  ARTHROPLASTY  May 2009   Bilateral  . VASECTOMY      Family History  Problem Relation Age of Onset  . Alcohol abuse Brother   . Hypertension Brother   . Hypertension Mother     Social History:  reports that he has never smoked. His smokeless tobacco use includes chew. He reports that he does not drink alcohol and does not use drugs.   Medications Prior to Admission  Medication Sig Dispense Refill  . albuterol (VENTOLIN HFA) 108 (90 Base) MCG/ACT inhaler Inhale 2 puffs into the lungs every 6 (six) hours as needed for wheezing. 18 g 0  . amLODipine (NORVASC) 10 MG tablet TAKE ONE (1) TABLET BY MOUTH ONCE DAILY (Patient taking differently: Take 10 mg by mouth daily.) 90 tablet 3  . Ascorbic Acid (VITAMIN C) 1000 MG tablet Take 1,000 mg by mouth daily.    . ferrous sulfate 324 (65 Fe) MG TBEC Take 1 tablet (325 mg total) by mouth 2 (two) times daily. (Patient taking differently: Take 325 mg by mouth daily.) 180 tablet 3  . fluticasone (FLONASE) 50 MCG/ACT nasal spray Place 2 sprays into both nostrils daily. (Patient taking differently: Place 2 sprays into both nostrils daily as needed for allergies.) 16 g 6  . furosemide (LASIX) 40 MG tablet Take 1 tablet (40 mg total) by mouth in the morning and at bedtime. (Patient taking differently: Take 40 mg by mouth daily.) 180 tablet 1  . guaiFENesin (MUCINEX) 600 MG 12 hr tablet Take 600 mg  by mouth 2 (two) times daily as needed for cough.    . losartan (COZAAR) 100 MG tablet Take 1 tablet (100 mg total) by mouth daily. 90 tablet 3  . metoprolol succinate (TOPROL-XL) 25 MG 24 hr tablet TAKE ONE (1) TABLET BY MOUTH ONCE DAILY (Patient taking differently: Take 25 mg by mouth daily. TAKE ONE (1) TABLET BY MOUTH ONCE DAILY) 90 tablet 3  . pravastatin (PRAVACHOL) 40 MG tablet Take 1 tablet (40 mg total) by mouth daily. 90 tablet 3  . warfarin (COUMADIN) 5 MG tablet Take 1 to 2 tablets or as prescribed by [facility] Clinic (Patient taking differently: Take  7.5 mg by mouth every Monday, Wednesday, and Friday.) 90 tablet 3  . blood glucose meter kit and supplies Dispense based on patient and insurance preference. Use up to four times daily as directed. (FOR ICD-10 E10.9, E11.9). 1 each 0  . Blood Glucose Monitoring Suppl (ACCU-CHEK GUIDE ME) w/Device KIT USE METER TO CHECK GLUCOSE THREE TIMES DAILY BEFORE MEAL(S) 1 kit 3  . lidocaine (LIDODERM) 5 % Place 1 patch onto the skin daily. Remove & Discard patch within 12 hours or as directed by MD (Patient not taking: No sig reported) 30 patch 0  . oxycodone (OXY-IR) 5 MG capsule Take 1 capsule (5 mg total) by mouth at bedtime as needed. (Patient not taking: Reported on 06/04/2020) 30 capsule 0  . spironolactone (ALDACTONE) 25 MG tablet Take 1 tablet (25 mg total) by mouth daily. (Patient not taking: Reported on 06/04/2020) 90 tablet 1     Physical Exam: Right shoulder demonstrates severely painful and guarded motion as noted at the recent office visit.  Elevation no more than 30 degrees with extreme discomfort and global weakness.  Plain radiographs confirm severe osteoarthritis with complete loss of joint space, subchondral sclerosis, and peripheral osteophyte formation.  Vitals  Temp:  [98 F (36.7 C)] 98 F (36.7 C) (03/10 1044) Pulse Rate:  [59-82] 65 (03/10 1204) Resp:  [15-26] 18 (03/10 1204) BP: (145-162)/(87-101) 146/87 (03/10 1204) SpO2:  [98 %-100 %] 99 % (03/10 1204) Weight:  [150.1 kg] 150.1 kg (03/10 1051)  Assessment/Plan  Impression: Right shoulder osteoarthritis  Plan of Action: Procedure(s): REVERSE SHOULDER ARTHROPLASTY  Margi Edmundson M Linzey Ramser 06/20/2020, 12:41 PM Contact # [phone]

## 2020-06-20 NOTE — Anesthesia Procedure Notes (Signed)
Procedure Name: Intubation Date/Time: 06/20/2020 1:15 PM Performed by: Raenette Rover, CRNA Pre-anesthesia Checklist: Patient identified, Emergency Drugs available, Suction available and Patient being monitored Patient Re-evaluated:Patient Re-evaluated prior to induction Oxygen Delivery Method: Circle system utilized Preoxygenation: Pre-oxygenation with 100% oxygen Induction Type: IV induction Ventilation: Mask ventilation without difficulty Laryngoscope Size: Miller and 3 Grade View: Grade I Tube type: Oral Tube size: 7.5 mm Number of attempts: 1 Airway Equipment and Method: Stylet Placement Confirmation: ETT inserted through vocal cords under direct vision,  positive ETCO2 and breath sounds checked- equal and bilateral Secured at: 22 cm Tube secured with: Tape Dental Injury: Teeth and Oropharynx as per pre-operative assessment

## 2020-06-20 NOTE — Anesthesia Postprocedure Evaluation (Signed)
Anesthesia Post Note  Patient: Nathan Grant  Procedure(s) Performed: REVERSE SHOULDER ARTHROPLASTY (Right Shoulder)     Patient location during evaluation: PACU Anesthesia Type: General Level of consciousness: awake and alert and oriented Pain management: pain level controlled Vital Signs Assessment: post-procedure vital signs reviewed and stable Respiratory status: spontaneous breathing, nonlabored ventilation and respiratory function stable Cardiovascular status: blood pressure returned to baseline and stable Postop Assessment: no apparent nausea or vomiting Anesthetic complications: no   No complications documented.  Last Vitals:  Vitals:   06/20/20 1551 06/20/20 1558  BP: (!) 146/82   Pulse: (!) 57 (!) 59  Resp: (!) 21 19  Temp: (!) 36.1 C   SpO2: 97% 96%    Last Pain:  Vitals:   06/20/20 1551  TempSrc:   PainSc: 0-No pain                 Jasma Seevers A.

## 2020-06-20 NOTE — Anesthesia Preprocedure Evaluation (Addendum)
Anesthesia Evaluation  Patient identified by MRN, date of birth, ID band Patient awake    Reviewed: Allergy & Precautions, NPO status , Patient's Chart, lab work & pertinent test results, reviewed documented beta blocker date and time   Airway Mallampati: III  TM Distance: >3 FB Neck ROM: Full    Dental no notable dental hx. (+) Teeth Intact   Pulmonary sleep apnea and Continuous Positive Airway Pressure Ventilation , pneumonia, resolved, PE Hx/o Covid-19 04/2019 recovered   Pulmonary exam normal breath sounds clear to auscultation       Cardiovascular hypertension, Pt. on medications +CHF, + PND and + DVT  + dysrhythmias Atrial Fibrillation and Supra Ventricular Tachycardia  Rhythm:Regular Rate:Normal  EKG 04/2020 Atrial flutter 3:1 block, inverted T suggestive of inferolateral ischemia  Echo 05/21/20 1. Left ventricular ejection fraction, by estimation, is 60 to 65%. The left ventricle has normal function. The left ventricle has no regional wall motion abnormalities. There is severe left ventricular hypertrophy. Left ventricular diastolic function could not be evaluated.  2. Right ventricular systolic function is normal. The right ventricular size is normal.  3. Left atrial size was moderately dilated.  4. Right atrial size was moderately dilated.  5. The pericardial effusion is posterior to the left ventricle.  6. The mitral valve is abnormal. Mild mitral valve regurgitation.  7. The aortic valve is tricuspid. Aortic valve regurgitation is not visualized. Mild aortic valve sclerosis is present, with no evidence of aortic valve stenosis.  8. Aortic dilatation noted. There is mild dilatation of the ascending aorta, measuring 39 mm.  9. Cannot exclude a small PFO.    Neuro/Psych  Headaches, negative psych ROS   GI/Hepatic negative GI ROS, Neg liver ROS,   Endo/Other  diabetes, Well Controlled, Type 2Morbid  obesityHyperlipidemia  Renal/GU Renal InsufficiencyRenal disease  negative genitourinary   Musculoskeletal  (+) Arthritis , Osteoarthritis,  DJD right shoulder   Abdominal (+) + obese,   Peds  Hematology  (+) anemia , Coumadin therapy- last dose 06/15/20   Anesthesia Other Findings   Reproductive/Obstetrics ED                            Anesthesia Physical Anesthesia Plan  ASA: III  Anesthesia Plan: General   Post-op Pain Management:  Regional for Post-op pain   Induction: Intravenous  PONV Risk Score and Plan: 3 and Ondansetron, Dexamethasone and Treatment may vary due to age or medical condition  Airway Management Planned: Oral ETT  Additional Equipment: None  Intra-op Plan:   Post-operative Plan: Extubation in OR  Informed Consent: I have reviewed the patients History and Physical, chart, labs and discussed the procedure including the risks, benefits and alternatives for the proposed anesthesia with the patient or authorized representative who has indicated his/her understanding and acceptance.     Dental advisory given  Plan Discussed with: CRNA and Anesthesiologist  Anesthesia Plan Comments:         Anesthesia Quick Evaluation

## 2020-06-20 NOTE — Progress Notes (Signed)
Assisted Dr. Foster with right, ultrasound guided, interscalene  block. Side rails up, monitors on throughout procedure. See vital signs in flow sheet. Tolerated Procedure well. 

## 2020-06-20 NOTE — Op Note (Signed)
06/20/2020  2:28 PM  PATIENT:   Nathan Grant  75 y.o. male  PRE-OPERATIVE DIAGNOSIS:  Right shoulder osteoarthritis with associated severe rotator cuff dysfunction  POST-OPERATIVE DIAGNOSIS: Same  PROCEDURE: Right shoulder reverse arthroplasty utilizing a press-fit size 6 Arthrex stem with a +6 polyethylene insert, 39/+4 glenosphere on a small/+2 baseplate  SURGEON:  Jerriah Ines, Metta Clines M.D.  ASSISTANTS: Jenetta Loges, PA-C  ANESTHESIA:   General endotracheal and interscalene block with Exparel  EBL: 100 cc  SPECIMEN: None  Drains: None   PATIENT DISPOSITION:  PACU - hemodynamically stable.    PLAN OF CARE: Admit for overnight observation  Brief history:  Patient is a 75 year old male who has multiple underlying significant medical comorbidities who presents with severe chronic right shoulder pain which is now significantly impacting his quality of life.  His symptoms have been refractory to prolonged attempts at conservative management.  He is now brought to the operating for planned right shoulder reverse arthroplasty.  Preoperatively, I counseled the patient regarding treatment options and risks versus benefits thereof.  Possible surgical complications were all reviewed including potential for bleeding, infection, neurovascular injury, persistent pain, loss of motion, anesthetic complication, failure of the implant, and possible need for additional surgery. They understand and accept and agrees with our planned procedure.   Procedure detail:  After undergoing routine preop evaluation the patient received prophylactic antibiotics and an interscalene block with Exparel was established in the holding area by the anesthesia department.  The patient was subsequently placed supine on the operating table and underwent the smooth induction of general endotracheal anesthesia.  Subsequently placed into the beachchair position and appropriately padded protected.  The right shoulder  girdle region was sterilely prepped and draped in standard fashion.  Timeout was called.  A deltopectoral approach to the right shoulder is made through a 12 cm incision.  Skin flaps were elevated dissection carried deeply the deltopectoral interval was developed from proximal to distal with the vein taken laterally.  Upper centimeter and a half of the pectoralis major was tenotomized for exposure and the conjoined tendon was mobilized and retracted medially and adhesions were divided from beneath the deltoid.  The long head biceps tendon was noted to be significantly attenuated proximally.  We did go ahead and tenodesis at the upper border the pectoralis major and the proximal segment was excised.  The rotator cuff was then split along the rotator interval to the base of the coracoid and the subscapularis was then elevated away from the lesser tuberosity using a peel technique and was tagged with a pair of suture tape sutures.  Capsular attachments were then divided from the anterior and infra margins of the humeral neck in a subperiosteal fashion allowing delivery of the humeral head through the wound.  Extra medullary guide was then used to outline our proposed humeral head resection which was performed with an oscillating saw at approximately 25 degrees of retroversion and then the peripheral osteophytes were removed with a rondure.  A medium metal cap was then placed over the cut proximal humeral surface and the glenoid was then exposed.  A circumferential labral resection was then completed gaining complete visualization of the periphery of the glenoid and I did perform a limited synovectomy removing some abundant proliferative synovial overgrowth particularly inferiorly.  A guidepin was then directed into the center of the glenoid with a approximately 5 degree inferior tilt.  Glenoid was then reamed with the central followed by the peripheral reamer and all debris was then  removed.  Final preparation was  completed with the central drill and tap and a 35 mm lag screw was selected.  Baseplate was assembled and then inserted after vancomycin powder had been placed on the threads.  The peripheral locking screws were all then placed using standard technique with vancomycin powder on the threads and these all achieved excellent purchase and fixation.  The joint was copes irrigated.  The 39/+4 glenosphere was then impacted on the baseplate and the locking screw was placed.  We then returned our attention to the proximal humerus where the canal was opened and ultimately broached up to a size 6 which showed excellent fit and fixation.  A central metaphyseal reamer was then utilized.  Trial implant was then placed trial reduction showed good motion good stability good soft tissue balance.  This point the trial was removed.  Our final implant was assembled on the back table vancomycin powder spread liberally into the canal the implant was then seated and impacted with excellent fit and fixation.  Trial reductions at this point showed that a +6 polygave [place] the best motion soft tissue balance and stability.  The trial polywas removed the final +6 polywas then impacted onto the implant after it was meticulously cleaned and dried.  Final reduction again showed good motion good stability good soft tissue balance.  We confirmed good mobility of the subscapularis and it was then repaired back to the metaphysis using the eyelets on the collar of her implant.  This subsequently allowed easily 45 degrees of external rotation without excessive tension on the repair.  The balance of the vancomycin powder was then spread liberally about the wound operative then finally irrigated cleaned and dried.  The deltopectoral interval was then repaired with a series of figure-of-eight and 1 Vicryl sutures.  2-0 Monocryl used for the subcu layer and intracuticular 3-0 Monocryl for the skin followed by Dermabond and Aquacel dressing.  Right arm was  then placed into a sling.  The patient was awakened, extubated, and taken to the apparent room in stable addition.  Jenetta Loges, PA-C was utilized as an Environmental consultant throughout this case, essential for help with positioning the patient, positioning extremity, tissue manipulation, implantation of the prosthesis, suture management, wound closure, and intraoperative decision-making.  Marin Shutter MD   Contact # [phone]

## 2020-06-20 NOTE — Transfer of Care (Signed)
Immediate Anesthesia Transfer of Care Note  Patient: Nathan Grant  Procedure(s) Performed: REVERSE SHOULDER ARTHROPLASTY (Right Shoulder)  Patient Location: PACU  Anesthesia Type:GA combined with regional for post-op pain  Level of Consciousness: awake, drowsy and patient cooperative  Airway & Oxygen Therapy: Patient Spontanous Breathing and Patient connected to face mask oxygen  Post-op Assessment: Report given to RN and Post -op Vital signs reviewed and stable  Post vital signs: Reviewed and stable  Last Vitals:  Vitals Value Taken Time  BP 146/90 06/20/20 1457  Temp    Pulse 72 06/20/20 1459  Resp 13 06/20/20 1459  SpO2 95 % 06/20/20 1459  Vitals shown include unvalidated device data.  Last Pain:  Vitals:   06/20/20 1051  TempSrc:   PainSc: 0-No pain      Patients Stated Pain Goal: 4 (50/56/97 9480)  Complications: No complications documented.

## 2020-06-20 NOTE — Discharge Instructions (Signed)
 Kevin M. Supple, M.D., F.A.A.O.S. Orthopaedic Surgery Specializing in Arthroscopic and Reconstructive Surgery of the Shoulder [phone] [address] - [place], [place] [address] - Fax [phone]   POST-OP TOTAL SHOULDER REPLACEMENT INSTRUCTIONS  1. Follow up in the office for your first post-op appointment 10-14 days from the date of your surgery. If you do not already have a scheduled appointment, our office will contact you to schedule.  2. The bandage over your incision is waterproof. You may begin showering with this dressing on. You may leave this dressing on until first follow up appointment within 2 weeks. We prefer you leave this dressing in place until follow up however after 5-7 days if you are having itching or skin irritation and would like to remove it you may do so. Go slow and tug at the borders gently to break the bond the dressing has with the skin. At this point if there is no drainage it is okay to go without a bandage or you may cover it with a light guaze and tape. You can also expect significant bruising around your shoulder that will drift down your arm and into your chest wall. This is very normal and should resolve over several days.   3. Wear your sling/immobilizer at all times except to perform the exercises below or to occasionally let your arm dangle by your side to stretch your elbow. You also need to sleep in your sling immobilizer until instructed otherwise. It is ok to remove your sling if you are sitting in a controlled environment and allow your arm to rest in a position of comfort by your side or on your lap with pillows to give your neck and skin a break from the sling. You may remove it to allow arm to dangle by side to shower. If you are up walking around and when you go to sleep at night you need to wear it.  4. Range of motion to your elbow, wrist, and hand are encouraged 3-5 times daily. Exercise to your hand and fingers helps to reduce  swelling you may experience.   5. Prescriptions for a pain medication and a muscle relaxant are provided for you. It is recommended that if you are experiencing pain that you pain medication alone is not controlling, add the muscle relaxant along with the pain medication which can give additional pain relief. The first 1-2 days is generally the most severe of your pain and then should gradually decrease. As your pain lessens it is recommended that you decrease your use of the pain medications to an "as needed basis'" only and to always comply with the recommended dosages of the pain medications.  6. Pain medications can produce constipation along with their use. If you experience this, the use of an over the counter stool softener or laxative daily is recommended.   7. For additional questions or concerns, please do not hesitate to call the office. If after hours there is an answering service to forward your concerns to the physician on call.  8.Pain control following an exparel block  To help control your post-operative pain you received a nerve block  performed with Exparel which is a long acting anesthetic (numbing agent) which can provide pain relief and sensations of numbness (and relief of pain) in the operative shoulder and arm for up to 3 days. Sometimes it provides mixed relief, meaning you may still have numbness in certain areas of the arm but can still be able to   move  parts of that arm, hand, and fingers. We recommend that your prescribed pain medications  be used as needed. We do not feel it is necessary to "pre medicate" and "stay ahead" of pain.  Taking narcotic pain medications when you are not having any pain can lead to unnecessary and potentially dangerous side effects.    9. Use the ice machine as much as possible in the first 5-7 days from surgery, then you can wean its use to as needed. The ice typically needs to be replaced every 6 hours, instead of ice you can actually freeze  water bottles to put in the cooler and then fill water around them to avoid having to purchase ice. You can have spare water bottles freezing to allow you to rotate them once they have melted. Try to have a thin shirt or light cloth or towel under the ice wrap to protect your skin.   FOR ADDITIONAL INFO ON ICE MACHINE AND INSTRUCTIONS GO TO THE WEBSITE AT  https://[email or web address].com/products/donjoy/[email or web address]-[email or web address]  10.  We recommend that you avoid any dental work or cleaning in the first 3 months following your joint replacement. This is to help minimize the possibility of infection from the bacteria in your mouth that enters your bloodstream during dental work. We also recommend that you take an antibiotic prior to your dental work for the first year after your shoulder replacement to further help reduce that risk. Please simply contact our office for antibiotics to be sent to your pharmacy prior to dental work.  11. Dental Antibiotics:  In most cases prophylactic antibiotics for Dental procdeures after total joint surgery are not necessary.  Exceptions are as follows:  1. History of prior total joint infection  2. Severely immunocompromised (Organ Transplant, cancer chemotherapy, Rheumatoid biologic meds such as Humera)  3. Poorly controlled diabetes (A1C &gt; 8.0, blood glucose over 200)  If you have one of these conditions, contact your surgeon for an antibiotic prescription, prior to your dental procedure.   POST-OP EXERCISES  Pendulum Exercises  Perform pendulum exercises while standing and bending at the waist. Support your uninvolved arm on a table or chair and allow your operated arm to hang freely. Make sure to do these exercises passively - not using you shoulder muscles. These exercises can be performed once your nerve block effects have worn off.  Repeat 20 times. Do 3 sessions per day.     

## 2020-06-20 NOTE — Anesthesia Procedure Notes (Signed)
Anesthesia Regional Block: Interscalene brachial plexus block   Pre-Anesthetic Checklist: ,, timeout performed, Correct Patient, Correct Site, Correct Laterality, Correct Procedure, Correct Position, site marked, Risks and benefits discussed,  Surgical consent,  Pre-op evaluation,  At surgeon's request and post-op pain management  Laterality: Right  Prep: chloraprep       Needles:  Injection technique: Single-shot  Needle Type: Echogenic Stimulator Needle     Needle Length: 9cm  Needle Gauge: 21   Needle insertion depth: 7 cm   Additional Needles:   Procedures:,,,, ultrasound used (permanent image in chart),,,,  Narrative:  Start time: 06/20/2020 11:58 AM End time: 06/20/2020 12:03 PM Injection made incrementally with aspirations every 5 mL.  Performed by: Personally  Anesthesiologist: Josephine Igo, MD  Additional Notes: Timeout performed. Patient sedated. Relevant anatomy ID'd using [place]. Incremental 2-73ml injection of LA with frequent aspiration. Patient tolerated procedure well.        Right Interscalene Block

## 2020-06-21 DIAGNOSIS — M19011 Primary osteoarthritis, right shoulder: Secondary | ICD-10-CM | POA: Diagnosis not present

## 2020-06-21 DIAGNOSIS — M25711 Osteophyte, right shoulder: Secondary | ICD-10-CM | POA: Diagnosis not present

## 2020-06-21 DIAGNOSIS — Z7901 Long term (current) use of anticoagulants: Secondary | ICD-10-CM | POA: Diagnosis not present

## 2020-06-21 DIAGNOSIS — R69 Illness, unspecified: Secondary | ICD-10-CM | POA: Diagnosis not present

## 2020-06-21 DIAGNOSIS — Z8249 Family history of ischemic heart disease and other diseases of the circulatory system: Secondary | ICD-10-CM | POA: Diagnosis not present

## 2020-06-21 DIAGNOSIS — Z79899 Other long term (current) drug therapy: Secondary | ICD-10-CM | POA: Diagnosis not present

## 2020-06-21 DIAGNOSIS — Z8616 Personal history of COVID-19: Secondary | ICD-10-CM | POA: Diagnosis not present

## 2020-06-21 DIAGNOSIS — Z6841 Body Mass Index (BMI) 40.0 and over, adult: Secondary | ICD-10-CM | POA: Diagnosis not present

## 2020-06-21 DIAGNOSIS — I4891 Unspecified atrial fibrillation: Secondary | ICD-10-CM | POA: Diagnosis not present

## 2020-06-21 LAB — GLUCOSE, CAPILLARY: Glucose-Capillary: 124 mg/dL — ABNORMAL HIGH (ref 70–99)

## 2020-06-21 LAB — HEMOGLOBIN A1C
Hgb A1c MFr Bld: 6.4 % — ABNORMAL HIGH (ref 4.8–5.6)
Mean Plasma Glucose: 136.98 mg/dL

## 2020-06-21 NOTE — Evaluation (Addendum)
Occupational Therapy Evaluation Patient Details Name: Nathan Grant MRN: [id number] DOB: 1945/11/07 Today's Date: 06/21/2020    History of Present Illness Patient s/p Right reverse TSA   Clinical Impression   Mr. Marlene Pfluger is a 75 year old man s/p shoulder replacement without functional use of right non-dominant upper extremity secondary to effects of surgery, interscalene block and shoulder precautions. Therapist provided education and instruction to patient in regards to exercises, shoulder precautions and restrictions, positioning, donning upper extremity clothing and bathing while maintaining shoulder precautions, ice cuff and cooler management and donning/doffing sling. Patient verbalized understanding and patient provided with handouts to maximize retention of education. Patient needed assistance to donn shirt and supervision to don underwear, pants, and sling to adher to UE precautions. Increased time in room to reiterate all education with patient. Patient to follow up with MD for further therapy needs.      Follow Up Recommendations  Follow surgeon's recommendation for DC plan and follow-up therapies    Equipment Recommendations  Tub/shower seat    Recommendations for Other Services       Precautions / Restrictions Precautions Precautions: Shoulder Type of Shoulder Precautions: If sitting in controlled environment, ok to come out of sling to give neck a break. Please sleep in it to protect until follow up in office.     OK to use operative arm for feeding, hygiene and ADLs.   Ok to instruct Pendulums and lap slides as exercises. Ok to use operative arm within the following parameters for ADL purposes     New ROM (8/18)   Ok for PROM, AAROM, AROM within pain tolerance and within the following ROM   ER 20   ABD 45   FE 60 Shoulder Interventions: Shoulder sling/immobilizer;At all times;Off for dressing/bathing/exercises Precaution Comments: No resistance with internal  rotation internal rotation , no exercises for internal rotation Restrictions Weight Bearing Restrictions: Yes RUE Weight Bearing: Non weight bearing      Mobility Bed Mobility Overal bed mobility: Needs Assistance Bed Mobility: Supine to Sit     Supine to sit: Supervision          Transfers                 General transfer comment: min guard for safety for standign and ambulating in room    Balance Overall balance assessment: No apparent balance deficits (not formally assessed)                                         ADL either performed or assessed with clinical judgement   ADL Overall ADL's : Needs assistance/impaired Eating/Feeding: Independent   Grooming: Modified independent   Upper Body Bathing: Minimal assistance   Lower Body Bathing: Minimal assistance   Upper Body Dressing : Moderate assistance;Sitting;Adhering to UE precautions   Lower Body Dressing: Set up;Sit to/from stand   Toilet Transfer: Min guard   Toileting- Water quality scientist and Hygiene: Min guard               Vision Baseline Vision/History: Wears glasses Wears Glasses: At all times       Perception     Praxis      Pertinent Vitals/Pain Pain Assessment: No/denies pain     Hand Dominance Left   Extremity/Trunk Assessment Upper Extremity Assessment Upper Extremity Assessment: RUE deficits/detail RUE Deficits / Details: Decreased active ROM in shoulder, elbow, wrist and  forearm secondary to continued effects of interscalene block. normal ROM in fingers.   Lower Extremity Assessment Lower Extremity Assessment: Overall WFL for tasks assessed       Communication Communication Communication: No difficulties   Cognition Arousal/Alertness: Awake/alert Behavior During Therapy: WFL for tasks assessed/performed Overall Cognitive Status: Within Functional Limits for tasks assessed                                     General  Comments       Exercises     Shoulder Instructions Shoulder Instructions Donning/doffing shirt without moving shoulder: Patient able to independently direct caregiver Method for sponge bathing under operated UE: Patient able to independently direct caregiver Donning/doffing sling/immobilizer: Patient able to independently direct caregiver Correct positioning of sling/immobilizer: Patient able to independently direct caregiver Pendulum exercises (written home exercise program): Independent ROM for elbow, wrist and digits of operated UE: Independent Sling wearing schedule (on at all times/off for ADL's): Independent Proper positioning of operated UE when showering: Independent Dressing change: Independent Positioning of UE while sleeping: [place] expects to be discharged to:: Private residence Living Arrangements: Spouse/significant other Available Help at Discharge: Family Type of Home: House Home Access: Stairs to enter     [place]: Two level     Bathroom Shower/Tub: Tub/shower unit         [place]: Environmental consultant - 2 wheels          Prior Functioning/Environment Level of Independence: Independent                 OT Problem List: Decreased strength;Decreased range of motion;Impaired UE functional use;Pain      OT Treatment/Interventions:      OT Goals(Current goals can be found in the care plan section) Acute Rehab OT Goals OT Goal Formulation: All assessment and education complete, DC therapy  OT Frequency:     Barriers to D/C:            Co-evaluation              AM-PAC OT "6 Clicks" Daily Activity     Outcome Measure Help from another person eating meals?: None Help from another person taking care of personal grooming?: None Help from another person toileting, which includes using toliet, bedpan, or urinal?: A Little Help from another person bathing (including washing, rinsing, drying)?: A Little Help  from another person to put on and taking off regular upper body clothing?: A Lot Help from another person to put on and taking off regular lower body clothing?: A Little 6 Click Score: 19   End of Session Nurse Communication:  (OT education complete)  Activity Tolerance: Patient tolerated treatment well Patient left: in chair;with call bell/phone within reach  OT Visit Diagnosis: Pain Pain - Right/Left: Right Pain - part of body: Shoulder                Time: 2947-6546 OT Time Calculation (min): 45 min Charges:  OT General Charges $OT Visit: 1 Visit OT Evaluation $OT Eval Low Complexity: 1 Low OT Treatments $Self Care/Home Management : 23-37 mins  Emiliano Welshans, OTR/L Acute Care Rehab Services  Office [phone] Pager: [phone]   Lenward Chancellor 06/21/2020, 11:21 AM

## 2020-06-21 NOTE — Discharge Summary (Signed)
s PATIENT IDCristiano Grant  MRN:     [id number] DOB/AGE:    76-Nov-1947 / 76 y.o.     DISCHARGE SUMMARY  ADMISSION DATE:    06/20/2020 DISCHARGE DATE:    ADMISSION DIAGNOSIS: Right shoulder osteoarthritis Past Medical History:  Diagnosis Date   Atrial flutter ([place])    s/p cardioversion 06/2010   Bradycardia    a. Accelerated junctional & sinus bradycardia during 01/2013 adm.   Bradycardia    Chronic headaches    Degenerative joint disease     End-stage degenerative joint disease, left knee   Dysrhythmia    Afib/A fltter   Erectile dysfunction    HTN (hypertension)    Hypertensive heart disease    a. Admit for CP 01/2013 felt due to this. Cath with normal coronary anatomy.   Microcytic anemia  03/22/2001   Morbid obesity ([place])    NSVT (nonsustained ventricular tachycardia) ([place] [facility] [place])    a. During 01/2013 adm.   Obesity    Osteoarthritis    Tendonitis    Tobacco chew use     History of chewing tobacco usage.    Urinary tract infection     Pseudomonas urinary tract infection   VT (ventricular tachycardia) ([facility])     DISCHARGE DIAGNOSIS:   Active Problems:   S/P reverse total shoulder arthroplasty, right   PROCEDURE: Procedure(s): REVERSE SHOULDER ARTHROPLASTY on 06/20/2020  CONSULTS:    HISTORY:  See H&P in chart.  HOSPITAL COURSE:  Nathan Grant is a 75 y.o. admitted on 06/20/2020 with a diagnosis of Right shoulder osteoarthritis.  They were brought to the operating room on 06/20/2020 and underwent Procedure(s): [place].    They were given perioperative antibiotics:  Anti-infectives (From admission, onward)   Start     Dose/Rate Route Frequency Ordered Stop   06/20/20 1403  vancomycin (VANCOCIN) powder  Status:  Discontinued          As needed 06/20/20 1423 06/20/20 1613   06/20/20 1045  ceFAZolin (ANCEF) 3 g in dextrose 5 % 50 mL IVPB        3 g 100 mL/hr over 30 Minutes Intravenous On call to O.R. 06/20/20 1033  06/20/20 1323    .  Patient underwent the above named procedure and tolerated it well. The following day they were hemodynamically stable and pain was controlled on oral analgesics. They were neurovascularly intact to the operative extremity. OT was ordered and worked with patient per protocol. They were medically and orthopaedically stable for discharge on .    DIAGNOSTIC STUDIES:  RECENT RADIOGRAPHIC STUDIES :  No results found.  RECENT VITAL SIGNS:   Patient Vitals for the past 24 hrs:  BP Temp Temp src Pulse Resp SpO2 Height Weight  06/21/20 0559 (!) 158/93 98.3 F (36.8 C) Oral (!) 56 16 99 % -- --  06/21/20 0151 (!) 169/93 98 F (36.7 C) Oral 60 16 100 % -- --  06/20/20 1928 (!) 152/89 98.3 F (36.8 C) Oral 61 17 99 % -- --  06/20/20 1844 (!) 155/89 97.7 F (36.5 C) -- (!) 59 14 97 % -- --  06/20/20 1741 (!) 148/87 (!) 97.2 F (36.2 C) -- 64 18 96 % -- --  06/20/20 1626 (!) 155/91 98 F (36.7 C) Oral (!) 57 20 96 % -- --  06/20/20 1558 -- -- -- (!) 59 19 96 % -- --  06/20/20 1551 (!) 146/82 (!) 97 F (36.1 C) -- (!)  57 (!) 21 97 % -- --  06/20/20 1544 -- (!) 97 F (36.1 C) -- 61 16 95 % -- --  06/20/20 1530 135/77 -- -- (!) 56 15 96 % -- --  06/20/20 1515 -- (!) 97 F (36.1 C) -- -- -- -- -- --  06/20/20 1500 (!) 149/87 -- -- 68 14 95 % -- --  06/20/20 1204 (!) 146/87 -- -- 65 18 99 % -- --  06/20/20 1203 -- -- -- 60 20 100 % -- --  06/20/20 1202 -- -- -- 65 18 100 % -- --  06/20/20 1201 -- -- -- 63 15 100 % -- --  06/20/20 1200 -- -- -- 62 (!) 23 100 % -- --  06/20/20 1159 (!) 162/101 -- -- (!) 59 (!) 23 100 % -- --  06/20/20 1158 -- -- -- 60 (!) 23 100 % -- --  06/20/20 1157 -- -- -- 63 (!) 26 100 % -- --  06/20/20 1156 -- -- -- 61 17 100 % -- --  06/20/20 1155 -- -- -- 76 (!) 24 100 % -- --  06/20/20 1154 (!) 145/94 -- -- 62 (!) 21 100 % -- --  06/20/20 1051 -- -- -- -- -- -- _0  (1.854 m) (!) 150.1 kg  06/20/20 1046 -- -- -- -- -- -- _1  (1.854 m) (!)  150.1 kg  06/20/20 1044 (!) 162/94 98 F (36.7 C) Oral 82 18 98 % -- --  .  RECENT EKG RESULTS:    Orders placed or performed in visit on 05/03/20   EKG 12-Lead    DISCHARGE INSTRUCTIONS:    DISCHARGE MEDICATIONS:   Allergies as of 06/21/2020      Reactions   Nsaids Other (See Comments)   Due to hypertension issues   Ibuprofen Hypertension   Patient stopped taking on his own due to his high blood pressure.      Medication List    STOP taking these medications   lidocaine 5 % Commonly known as: LIDODERM   spironolactone 25 MG tablet Commonly known as: ALDACTONE     TAKE these medications   Accu-Chek Guide Me w/Device Kit USE METER TO CHECK GLUCOSE THREE TIMES DAILY BEFORE MEAL(S)   albuterol 108 (90 Base) MCG/ACT inhaler Commonly known as: VENTOLIN HFA Inhale 2 puffs into the lungs every 6 (six) hours as needed for wheezing.   amLODipine 10 MG tablet Commonly known as: NORVASC TAKE ONE (1) TABLET BY MOUTH ONCE DAILY What changed:   how much to take  how to take this  when to take this  additional instructions   blood glucose meter kit and supplies Dispense based on patient and insurance preference. Use up to four times daily as directed. (FOR ICD-10 E10.9, E11.9).   cyclobenzaprine 10 MG tablet Commonly known as: FLEXERIL Take 1 tablet (10 mg total) by mouth 3 (three) times daily as needed for muscle spasms.   ferrous sulfate 324 (65 Fe) MG Tbec Take 1 tablet (325 mg total) by mouth 2 (two) times daily. What changed: when to take this   fluticasone 50 MCG/ACT nasal spray Commonly known as: FLONASE Place 2 sprays into both nostrils daily. What changed:   when to take this  reasons to take this   furosemide 40 MG tablet Commonly known as: LASIX Take 1 tablet (40 mg total) by mouth in the morning and at bedtime. What changed: when to take this   guaiFENesin 600 MG 12 hr  tablet Commonly known as: MUCINEX Take 600 mg by mouth 2 (two) times  daily as needed for cough.   losartan 100 MG tablet Commonly known as: COZAAR Take 1 tablet (100 mg total) by mouth daily.   metoprolol succinate 25 MG 24 hr tablet Commonly known as: TOPROL-XL TAKE ONE (1) TABLET BY MOUTH ONCE DAILY What changed:   how much to take  how to take this  when to take this   ondansetron 4 MG tablet Commonly known as: Zofran Take 1 tablet (4 mg total) by mouth every 8 (eight) hours as needed for nausea or vomiting.   oxycodone 5 MG capsule Commonly known as: OXY-IR Take 1 capsule (5 mg total) by mouth at bedtime as needed.   pravastatin 40 MG tablet Commonly known as: PRAVACHOL Take 1 tablet (40 mg total) by mouth daily.   vitamin C 1000 MG tablet Take 1,000 mg by mouth daily.   warfarin 5 MG tablet Commonly known as: COUMADIN Take as directed. If you are unsure how to take this medication, talk to your nurse or doctor. Original instructions: Take 1 to 2 tablets or as prescribed by [facility] What changed:   how much to take  how to take this  when to take this  additional instructions       FOLLOW UP VISIT:    Follow-up Information    Justice Britain, MD.   Specialty: Orthopedic Surgery Why: on 3/25 at 3:15 Contact information: [address] [place] [address] [phone]               DISCHARGE TO: Home   DISCHARGE CONDITION:  [place] for Dr. Justice Britain 06/21/2020, 7:30 AM

## 2020-06-24 ENCOUNTER — Encounter (HOSPITAL_COMMUNITY): Payer: Self-pay | Admitting: Orthopedic Surgery

## 2020-06-25 ENCOUNTER — Emergency Department (HOSPITAL_COMMUNITY)
Admission: EM | Admit: 2020-06-25 | Discharge: 2020-06-25 | Disposition: A | Discharge status: Home / Self Care | Payer: Medicare HMO | Attending: Emergency Medicine | Admitting: Emergency Medicine

## 2020-06-25 ENCOUNTER — Other Ambulatory Visit: Payer: Self-pay

## 2020-06-25 ENCOUNTER — Encounter (HOSPITAL_COMMUNITY): Payer: Self-pay

## 2020-06-25 ENCOUNTER — Emergency Department (HOSPITAL_COMMUNITY): Payer: Medicare HMO

## 2020-06-25 DIAGNOSIS — Z9889 Other specified postprocedural states: Secondary | ICD-10-CM | POA: Diagnosis not present

## 2020-06-25 DIAGNOSIS — Z7901 Long term (current) use of anticoagulants: Secondary | ICD-10-CM | POA: Diagnosis not present

## 2020-06-25 DIAGNOSIS — N183 Chronic kidney disease, stage 3 unspecified: Secondary | ICD-10-CM | POA: Insufficient documentation

## 2020-06-25 DIAGNOSIS — M7989 Other specified soft tissue disorders: Secondary | ICD-10-CM | POA: Diagnosis not present

## 2020-06-25 DIAGNOSIS — G8918 Other acute postprocedural pain: Secondary | ICD-10-CM | POA: Insufficient documentation

## 2020-06-25 DIAGNOSIS — Z7984 Long term (current) use of oral hypoglycemic drugs: Secondary | ICD-10-CM | POA: Insufficient documentation

## 2020-06-25 DIAGNOSIS — F17220 Nicotine dependence, chewing tobacco, uncomplicated: Secondary | ICD-10-CM | POA: Diagnosis not present

## 2020-06-25 DIAGNOSIS — Z79899 Other long term (current) drug therapy: Secondary | ICD-10-CM | POA: Diagnosis not present

## 2020-06-25 DIAGNOSIS — I5032 Chronic diastolic (congestive) heart failure: Secondary | ICD-10-CM | POA: Diagnosis not present

## 2020-06-25 DIAGNOSIS — E1122 Type 2 diabetes mellitus with diabetic chronic kidney disease: Secondary | ICD-10-CM | POA: Insufficient documentation

## 2020-06-25 DIAGNOSIS — I130 Hypertensive heart and chronic kidney disease with heart failure and stage 1 through stage 4 chronic kidney disease, or unspecified chronic kidney disease: Secondary | ICD-10-CM | POA: Diagnosis not present

## 2020-06-25 DIAGNOSIS — M25531 Pain in right wrist: Secondary | ICD-10-CM | POA: Diagnosis present

## 2020-06-25 DIAGNOSIS — I4891 Unspecified atrial fibrillation: Secondary | ICD-10-CM | POA: Insufficient documentation

## 2020-06-25 DIAGNOSIS — R69 Illness, unspecified: Secondary | ICD-10-CM | POA: Diagnosis not present

## 2020-06-25 DIAGNOSIS — Z96653 Presence of artificial knee joint, bilateral: Secondary | ICD-10-CM | POA: Insufficient documentation

## 2020-06-25 MED ORDER — OXYCODONE HCL 5 MG PO TABS
5.0000 mg | ORAL_TABLET | Freq: Once | ORAL | Status: AC
  Administered 2020-06-25: 5 mg via ORAL
  Filled 2020-06-25: qty 1

## 2020-06-25 MED ORDER — OXYCODONE HCL 5 MG PO TABS
10.0000 mg | ORAL_TABLET | Freq: Once | ORAL | Status: AC
  Administered 2020-06-25: 10 mg via ORAL
  Filled 2020-06-25: qty 2

## 2020-06-25 NOTE — Discharge Instructions (Addendum)
1. Medications: oxycodone as prescribed at home, usual home medications 2. Treatment: rest, drink plenty of fluids,  3. Follow Up: Please followup with Dr. Onnie Graham later today for additional evaluation of your wrist and arm; Please return to the ER for worsening pain, swelling, fevers or other concerns

## 2020-06-25 NOTE — ED Provider Notes (Signed)
[facility] DEPT Provider Note   CSN: [id number] Arrival date & time: 06/25/20  0033     History Chief Complaint  Patient presents with  . Post-op Problem    Nathan Grant is a 75 y.o. male  presents to the Emergency Department complaining of gradual, persistent, progressively worsening pain in the right wrist onset yesterday.  Patient reports initially the pain was moderate in nature and well controlled by his oxycodone.  He reports that tonight he took 1 tablet of oxycodone at 9 PM and it did not help his pain and he has thus been unable to sleep.  Records reviewed.  Patient underwent reverse shoulder arthroplasty of the right shoulder on 06/20/2020 by Dr. Onnie Graham for improvement of right shoulder osteoarthritis.  Patient reports he did receive a nerve block and this has started to wear off in the last 2 days.  Pt denies falls or trauma.  He does report some mild swelling in the wrist.  Patient is taking warfarin, but has not had any unusual bleeding.  Patient reports he has a history of gout but has not taken medicine in some time.  Patient denies numbness in the right arm.  He does report that it is weak but no worse than immediately after surgery.  He denies fever, chills.     The history is provided by the patient and medical records. No language interpreter was used.       Past Medical History:  Diagnosis Date  . Atrial flutter ([place])    s/p cardioversion 06/2010  . Bradycardia    a. Accelerated junctional & sinus bradycardia during 01/2013 adm.  . Bradycardia   . Chronic headaches   . Degenerative joint disease     End-stage degenerative joint disease, left knee  . Dysrhythmia    Afib/A fltter  . Erectile dysfunction   . HTN (hypertension)   . Hypertensive heart disease    a. Admit for CP 01/2013 felt due to this. Cath with normal coronary anatomy.  . Microcytic anemia  03/22/2001  . Morbid obesity ([place])   . NSVT (nonsustained ventricular  tachycardia) ([place])    a. During 01/2013 adm.  . Obesity   . Osteoarthritis   . Tendonitis   . Tobacco chew use     History of chewing tobacco usage.   . Urinary tract infection     Pseudomonas urinary tract infection  . VT (ventricular tachycardia) [facility])     Patient Active Problem List   Diagnosis Date Noted  . S/P reverse total shoulder arthroplasty, right 06/20/2020  . Atrial fibrillation ([place]) 05/24/2020  . Long term (current) use of anticoagulants 05/24/2020  . Ectatic abdominal aorta ([place]) 08/02/2019  . Pneumonia due to COVID-19 virus 04/19/2019  . Acute respiratory failure due to COVID-19 ([place]) 04/19/2019  . Wheezing 01/05/2019  . Post-nasal drip 01/05/2019  . Type 2 diabetes mellitus with stage 3 chronic kidney disease, with long-term current use of insulin ([place]) 04/28/2018  . Uricacidemia 04/28/2018  . Foot callus 04/28/2018  . Onychodystrophy 04/28/2018  . PND (paroxysmal nocturnal dyspnea) 03/08/2018  . Enuresis 03/08/2018  . Chronic gout due to renal impairment of multiple sites without tophus 03/08/2018  . OSA (obstructive sleep apnea) 03/08/2018  . CKD stage 3 due to type 2 diabetes mellitus ([place]) 03/08/2018  . Uncontrolled hypertension 03/08/2018  . Chronic diastolic CHF (congestive heart failure) ([place]) 09/06/2014  . CKD (chronic kidney disease) stage 3, GFR 30-59 ml/min ([facility]) 09/06/2014  . Hypercholesterolemia 12/21/2013  .  HTN (hypertension) 01/17/2013  . NSVT (nonsustained ventricular tachycardia) ([place]) 01/17/2013  . Accelerated junctional rhythm 01/17/2013  . Sinus bradycardia 01/17/2013  . Diastolic dysfunction 60/01/9322  . Acute on chronic renal failure ([place]) 05/30/2012  . Osteoarthritis   . Hypokalemia   . Erectile dysfunction   . Chronic headaches   . Hyperlipidemia   . Morbid obesity ([place])   . Atrial flutter ([place])   . Hypertensive heart disease   . Microcytic anemia 03/22/2001    Past Surgical History:  Procedure Laterality Date  .  CARDIOVERSION  07/01/2010  . LEFT HEART CATHETERIZATION WITH CORONARY ANGIOGRAM N/A 01/16/2013   Procedure: LEFT HEART CATHETERIZATION WITH CORONARY ANGIOGRAM;  Surgeon: Peter M Martinique, MD;  Location: [facility] CATH LAB;  Service: Cardiovascular;  Laterality: N/A;  . REVERSE SHOULDER ARTHROPLASTY Right 06/20/2020   Procedure: REVERSE SHOULDER ARTHROPLASTY;  Surgeon: Justice Britain, MD;  Location: [facility] ORS;  Service: Orthopedics;  Laterality: Right;  158mn  . TOTAL KNEE ARTHROPLASTY  May 2009   Bilateral  . VASECTOMY         Family History  Problem Relation Age of Onset  . Alcohol abuse Brother   . Hypertension Brother   . Hypertension Mother     Social History   Tobacco Use  . Smoking status: Never Smoker  . Smokeless tobacco: Current User    Types: Chew  Vaping Use  . Vaping Use: Never used  Substance Use Topics  . Alcohol use: No  . Drug use: No    Home Medications Prior to Admission medications   Medication Sig Start Date End Date Taking? Authorizing Provider  albuterol (VENTOLIN HFA) 108 (90 Base) MCG/ACT inhaler Inhale 2 puffs into the lungs every 6 (six) hours as needed for wheezing. 04/17/19   SJacelyn Pi ILilia Argue MD  amLODipine (NORVASC) 10 MG tablet TAKE ONE (1) TABLET BY MOUTH ONCE DAILY Patient taking differently: Take 10 mg by mouth daily. 02/01/20   HPosey Boyer MD  Ascorbic Acid (VITAMIN C) 1000 MG tablet Take 1,000 mg by mouth daily.    [provider]  blood glucose meter kit and supplies Dispense based on patient and insurance preference. Use up to four times daily as directed. (FOR ICD-10 E10.9, E11.9). 08/14/19   SForrest Moron MD  Blood Glucose Monitoring Suppl (ACCU-CHEK GUIDE ME) w/Device KIT USE METER TO CHECK GLUCOSE THREE TIMES DAILY BEFORE MEAL(S) 09/15/19   SJacelyn Pi ILilia Argue MD  cyclobenzaprine (FLEXERIL) 10 MG tablet Take 1 tablet (10 mg total) by mouth 3 (three) times daily as needed for muscle spasms. 06/20/20   Shuford, TOlivia Mackie PA-C  ferrous  sulfate 324 (65 Fe) MG TBEC Take 1 tablet (325 mg total) by mouth 2 (two) times daily. Patient taking differently: Take 325 mg by mouth daily. 08/02/19   SForrest Moron MD  fluticasone (FLONASE) 50 MCG/ACT nasal spray Place 2 sprays into both nostrils daily. Patient taking differently: Place 2 sprays into both nostrils daily as needed for allergies. 03/08/18   SForrest Moron MD  furosemide (LASIX) 40 MG tablet Take 1 tablet (40 mg total) by mouth in the morning and at bedtime. Patient taking differently: Take 40 mg by mouth daily. 11/14/19   MMaximiano Coss NP  guaiFENesin (MUCINEX) 600 MG 12 hr tablet Take 600 mg by mouth 2 (two) times daily as needed for cough.    [provider]  losartan (COZAAR) 100 MG tablet Take 1 tablet (100 mg total) by mouth daily. 02/01/20  Posey Boyer, MD  metoprolol succinate (TOPROL-XL) 25 MG 24 hr tablet TAKE ONE (1) TABLET BY MOUTH ONCE DAILY Patient taking differently: Take 25 mg by mouth daily. TAKE ONE (1) TABLET BY MOUTH ONCE DAILY 02/01/20   Posey Boyer, MD  ondansetron (ZOFRAN) 4 MG tablet Take 1 tablet (4 mg total) by mouth every 8 (eight) hours as needed for nausea or vomiting. 06/20/20   Shuford, Olivia Mackie, PA-C  oxycodone (OXY-IR) 5 MG capsule Take 1 capsule (5 mg total) by mouth at bedtime as needed. 06/20/20   Shuford, Olivia Mackie, PA-C  pravastatin (PRAVACHOL) 40 MG tablet Take 1 tablet (40 mg total) by mouth daily. 02/01/20   Posey Boyer, MD  warfarin (COUMADIN) 5 MG tablet Take 1 to 2 tablets or as prescribed by [facility] Patient taking differently: Take 7.5 mg by mouth every Monday, Wednesday, and Friday. 05/24/20   Martinique, Peter M, MD    Allergies    Nsaids and Ibuprofen  Review of Systems   Review of Systems  Constitutional: Negative for appetite change, diaphoresis, fatigue, fever and unexpected weight change.  HENT: Negative for mouth sores.   Eyes: Negative for visual disturbance.  Respiratory: Negative for cough, chest  tightness, shortness of breath and wheezing.   Cardiovascular: Negative for chest pain.  Gastrointestinal: Negative for abdominal pain, constipation, diarrhea, nausea and vomiting.  Endocrine: Negative for polydipsia, polyphagia and polyuria.  Genitourinary: Negative for dysuria, frequency, hematuria and urgency.  Musculoskeletal: Positive for arthralgias, joint swelling and myalgias. Negative for back pain and neck stiffness.  Skin: Negative for rash.  Allergic/Immunologic: Negative for immunocompromised state.  Neurological: Negative for syncope, light-headedness and headaches.  Hematological: Does not bruise/bleed easily.  Psychiatric/Behavioral: Negative for sleep disturbance. The patient is not nervous/anxious.     Physical Exam Updated Vital Signs BP (!) 198/113 (BP Location: Left Arm)   Pulse (!) 107   Temp 99.2 F (37.3 C) (Oral)   Resp 18   Ht _0  (1.854 m)   Wt (!) 150 kg   SpO2 100%   BMI 43.63 kg/m   Physical Exam Vitals and nursing note reviewed.  Constitutional:      General: He is not in acute distress.    Appearance: He is well-developed.  HENT:     Head: Normocephalic.  Eyes:     General: No scleral icterus.    Conjunctiva/sclera: Conjunctivae normal.  Cardiovascular:     Rate and Rhythm: Normal rate.  Pulmonary:     Effort: Pulmonary effort is normal.  Musculoskeletal:     Right shoulder: Tenderness present. Decreased range of motion.     Left shoulder: Normal.     Right elbow: Normal.     Left elbow: Normal.     Right forearm: Normal.     Left forearm: Normal.     Right wrist: Swelling and tenderness present. Decreased range of motion. Normal pulse.     Left wrist: Normal.     Right hand: Tenderness present. No swelling. Normal range of motion. Normal strength. Normal sensation. Normal capillary refill. Normal pulse.     Cervical back: Normal range of motion.  Skin:    General: Skin is warm and dry.     Comments: Bandages in place to the right  shoulder at incision site.  No erythema, induration or oozing around the bandages.  Neurological:     Mental Status: He is alert.     ED Results / Procedures / Treatments    Radiology  DG Wrist Complete Right  Result Date: 06/25/2020 CLINICAL DATA:  Pain and swelling EXAM: RIGHT WRIST - COMPLETE 3+ VIEW COMPARISON:  None. FINDINGS: No fracture or malalignment. Mild degenerative change at the radiocarpal and first CMC joints. Positive for soft tissue swelling. IMPRESSION: No acute osseous abnormality. Electronically Signed   By: Donavan Foil M.D.   On: 06/25/2020 01:47    Procedures Procedures   Medications Ordered in ED Medications  oxyCODONE (Oxy IR/ROXICODONE) immediate release tablet 10 mg (10 mg Oral Given 06/25/20 0139)  oxyCODONE (Oxy IR/ROXICODONE) immediate release tablet 5 mg (5 mg Oral Given 06/25/20 0432)    ED Course  I have reviewed the triage vital signs and the nursing notes.  Pertinent labs & imaging results that were available during my care of the patient were reviewed by me and considered in my medical decision making (see chart for details).  Clinical Course as of 06/25/20 0450  Tue Jun 25, 2020  0427 BP(!): 198/113 Pt with hx of same and in severe pain.  No headache, CP or SOB. [HM]  0427 Pulse Rate(!): 107 Noted - pt c/o severe pain at triage. [HM]    Clinical Course User Index [HM] Coolidge Gossard, Gwenlyn Perking   MDM Rules/Calculators/A&P                           Patient presents with severe pain of the right wrist.  Denies injuries.  Mild swelling is noted along with mild increased warmth.  Mild tenderness to the right hand but full range of motion of all fingers.  No tenderness to palpation along the right elbow.  Only minimal tenderness to the surgical incision site.  Patient afebrile on arrival.  He is hypertensive and tachycardic however I suspect this is secondary to severe pain.  Will give pain control, obtain x-ray of the right wrist and reevaluate.   Suspect gout flare of the right wrist.  1:59 AM Plain films without acute abnormality including no evidence of fracture or joint effusion.  I personally evaluated these images. Pain improving.   4:50 AM Pt with improvement in pain.  Right wrist splint applied.  Pt is to f/u with Dr. Onnie Graham this AM.   The patient was discussed with and evaluated by Dr. Betsey Holiday who agrees with the treatment plan.  BP (!) 146/78 (BP Location: Left Arm)   Pulse 98   Temp 99.2 F (37.3 C) (Oral)   Resp 18   Ht _0  (1.854 m)   Wt (!) 150 kg   SpO2 98%   BMI 43.63 kg/m    Final Clinical Impression(s) / ED Diagnoses Final diagnoses:  Post-operative pain  Right wrist pain    Rx / DC Orders ED Discharge Orders    None       Agapito Games 06/25/20 0450    Orpah Greek, MD 06/25/20 [phone]

## 2020-06-25 NOTE — ED Triage Notes (Signed)
Pt to ED by POV from home with c/o right arm and hand pain. Pt had right rotator cuff surgery 5 days ago here at [facility] by Dr Lavonna Rua. Pt reports pain to the right arm and had which began yesterday and has been getting progressively worse. Pt also endorses numbness. Arrives A+O, VSS, NADN.

## 2020-06-26 DIAGNOSIS — Z471 Aftercare following joint replacement surgery: Secondary | ICD-10-CM | POA: Diagnosis not present

## 2020-06-26 DIAGNOSIS — Z96611 Presence of right artificial shoulder joint: Secondary | ICD-10-CM | POA: Diagnosis not present

## 2020-06-28 ENCOUNTER — Ambulatory Visit (INDEPENDENT_AMBULATORY_CARE_PROVIDER_SITE_OTHER): Payer: Medicare HMO

## 2020-06-28 ENCOUNTER — Other Ambulatory Visit: Payer: Self-pay

## 2020-06-28 DIAGNOSIS — I4892 Unspecified atrial flutter: Secondary | ICD-10-CM

## 2020-06-28 DIAGNOSIS — Z7901 Long term (current) use of anticoagulants: Secondary | ICD-10-CM | POA: Diagnosis not present

## 2020-06-28 LAB — POCT INR: INR: 1.2 — AB (ref 2.0–3.0)

## 2020-06-28 NOTE — Patient Instructions (Signed)
Take 3 tablets today and then Continue taking 1.5 tablets(5 mg) Daily except 1 tablet on Wednesday. Recheck INR in 1 week.    Cranberries, Antibiotics, and Steroids will raise INR (International Normalized Ratio)  Blood thickness or thinness. Diarrhea will also raise INR.

## 2020-07-02 DIAGNOSIS — I10 Essential (primary) hypertension: Secondary | ICD-10-CM | POA: Diagnosis not present

## 2020-07-02 DIAGNOSIS — M109 Gout, unspecified: Secondary | ICD-10-CM | POA: Diagnosis not present

## 2020-07-02 DIAGNOSIS — S46001D Unspecified injury of muscle(s) and tendon(s) of the rotator cuff of right shoulder, subsequent encounter: Secondary | ICD-10-CM | POA: Diagnosis not present

## 2020-07-05 ENCOUNTER — Ambulatory Visit (INDEPENDENT_AMBULATORY_CARE_PROVIDER_SITE_OTHER): Payer: Medicare HMO | Admitting: Pharmacist Clinician (PhC)/ Clinical Pharmacy Specialist

## 2020-07-05 ENCOUNTER — Other Ambulatory Visit: Payer: Self-pay

## 2020-07-05 DIAGNOSIS — Z7901 Long term (current) use of anticoagulants: Secondary | ICD-10-CM

## 2020-07-05 DIAGNOSIS — I4892 Unspecified atrial flutter: Secondary | ICD-10-CM | POA: Diagnosis not present

## 2020-07-05 DIAGNOSIS — I4891 Unspecified atrial fibrillation: Secondary | ICD-10-CM | POA: Diagnosis not present

## 2020-07-05 LAB — CBC
Hematocrit: 35.4 % — ABNORMAL LOW (ref 37.5–51.0)
Hemoglobin: 10.9 g/dL — ABNORMAL LOW (ref 13.0–17.7)
MCH: 22.6 pg — ABNORMAL LOW (ref 26.6–33.0)
MCHC: 30.8 g/dL — ABNORMAL LOW (ref 31.5–35.7)
MCV: 73 fL — ABNORMAL LOW (ref 79–97)
Platelets: 448 10*3/uL (ref 150–450)
RBC: 4.82 x10E6/uL (ref 4.14–5.80)
RDW: 16.4 % — ABNORMAL HIGH (ref 11.6–15.4)
WBC: 9 10*3/uL (ref 3.4–10.8)

## 2020-07-05 LAB — SPECIMEN STATUS REPORT

## 2020-07-05 LAB — POCT INR: INR: 1.9 — AB (ref 2.0–3.0)

## 2020-07-08 ENCOUNTER — Other Ambulatory Visit: Payer: Self-pay

## 2020-07-08 DIAGNOSIS — D649 Anemia, unspecified: Secondary | ICD-10-CM

## 2020-07-08 NOTE — Progress Notes (Signed)
amb  

## 2020-07-12 NOTE — Progress Notes (Deleted)
Nathan Grant Date of Birth: May 12, 1945 Medical Record #[id number]  History of Present Illness: Nathan Grant is seen at the request of Dr Orland Mustard for evaluation of PVCs.  He was last seen by me in July 2018. He has a history of past AF/flutter, HTN, tobacco abuse and HLD. He is s/p DCCV in 2012. He has hypertensive heart disease with severe LVH. He  had a Myoview in February of 2014 due to chest pain which showed no ischemia and a normal EF. Was hospitalized back in October of 2014 with chest pain - was cathed - this was normal. Has had noted brief NSVT, accelerated junctional rhythm and bradycardia. Ecg in 2018 showed an ectopic atrial rhythm.  He was admitted in Jan. 2021 with acute respiratory distress in setting of Covid 19. Dopplers were positive for bilateral DVTs and Echo showed thrombus in the RA and PA with acute RV strain and severe pulmonary HTN. He was treated with TPA and transitioned to Eliquis. This was later discontinued due to cost. States he took Eliquis for about 3 months. He was in Atrial flutter at that time.    He was recently seen by primary care in December for pre operative clearance for shoulder surgery. Ecg showed Atrial flutter with controlled response and occ. PVCs. There was LVH and diffuse T wave inversion. Cardiology evaluation was recommended. He also has CKD stage IV.  Patient states he is doing well from a cardiac standpoint. He has chronic ankle swelling that is unchanged for years. He denies any chest pain, orthopnea, SOB, palpitations, dizziness or syncope. He primarily complains of right shoulder pain which limits his ability to sleep. He has seen Dr Onnie Graham for this. He did undergo right shoulder surgery on 06/20/20. He is reasonably active around the house.  He was started on Coumadin for his Afib. Echo was updated and showed severe LVH with moderate biatrial enlargement. No evidence of pulmonary HTN.   Current Outpatient Medications  Medication Sig Dispense  Refill  . albuterol (VENTOLIN HFA) 108 (90 Base) MCG/ACT inhaler Inhale 2 puffs into the lungs every 6 (six) hours as needed for wheezing. 18 g 0  . amLODipine (NORVASC) 10 MG tablet TAKE ONE (1) TABLET BY MOUTH ONCE DAILY (Patient taking differently: Take 10 mg by mouth daily.) 90 tablet 3  . Ascorbic Acid (VITAMIN C) 1000 MG tablet Take 1,000 mg by mouth daily.    . blood glucose meter kit and supplies Dispense based on patient and insurance preference. Use up to four times daily as directed. (FOR ICD-10 E10.9, E11.9). 1 each 0  . Blood Glucose Monitoring Suppl (ACCU-CHEK GUIDE ME) w/Device KIT USE METER TO CHECK GLUCOSE THREE TIMES DAILY BEFORE MEAL(S) 1 kit 3  . cyclobenzaprine (FLEXERIL) 10 MG tablet Take 1 tablet (10 mg total) by mouth 3 (three) times daily as needed for muscle spasms. 30 tablet 1  . ferrous sulfate 324 (65 Fe) MG TBEC Take 1 tablet (325 mg total) by mouth 2 (two) times daily. (Patient taking differently: Take 325 mg by mouth daily.) 180 tablet 3  . fluticasone (FLONASE) 50 MCG/ACT nasal spray Place 2 sprays into both nostrils daily. (Patient taking differently: Place 2 sprays into both nostrils daily as needed for allergies.) 16 g 6  . furosemide (LASIX) 40 MG tablet Take 1 tablet (40 mg total) by mouth in the morning and at bedtime. (Patient taking differently: Take 40 mg by mouth daily.) 180 tablet 1  . guaiFENesin (MUCINEX) 600 MG 12 hr  tablet Take 600 mg by mouth 2 (two) times daily as needed for cough.    . losartan (COZAAR) 100 MG tablet Take 1 tablet (100 mg total) by mouth daily. 90 tablet 3  . metoprolol succinate (TOPROL-XL) 25 MG 24 hr tablet TAKE ONE (1) TABLET BY MOUTH ONCE DAILY (Patient taking differently: Take 25 mg by mouth daily. TAKE ONE (1) TABLET BY MOUTH ONCE DAILY) 90 tablet 3  . ondansetron (ZOFRAN) 4 MG tablet Take 1 tablet (4 mg total) by mouth every 8 (eight) hours as needed for nausea or vomiting. 10 tablet 0  . oxycodone (OXY-IR) 5 MG capsule Take 1  capsule (5 mg total) by mouth at bedtime as needed. 20 capsule 0  . pravastatin (PRAVACHOL) 40 MG tablet Take 1 tablet (40 mg total) by mouth daily. 90 tablet 3  . warfarin (COUMADIN) 5 MG tablet Take 1 to 2 tablets or as prescribed by [facility] (Patient taking differently: Take 7.5 mg by mouth every Monday, Wednesday, and Friday.) 90 tablet 3   No current facility-administered medications for this visit.    Allergies  Allergen Reactions  . Nsaids Other (See Comments)    Due to hypertension issues  . Ibuprofen Hypertension    Patient stopped taking on his own due to his high blood pressure.    Past Medical History:  Diagnosis Date  . Atrial flutter ([place])    s/p cardioversion 06/2010  . Bradycardia    a. Accelerated junctional & sinus bradycardia during 01/2013 adm.  . Bradycardia   . Chronic headaches   . Degenerative joint disease     End-stage degenerative joint disease, left knee  . Dysrhythmia    Afib/A fltter  . Erectile dysfunction   . HTN (hypertension)   . Hypertensive heart disease    a. Admit for CP 01/2013 felt due to this. Cath with normal coronary anatomy.  . Microcytic anemia  03/22/2001  . Morbid obesity ([place])   . NSVT (nonsustained ventricular tachycardia) ([place])    a. During 01/2013 adm.  . Obesity   . Osteoarthritis   . Tendonitis   . Tobacco chew use     History of chewing tobacco usage.   . Urinary tract infection     Pseudomonas urinary tract infection  . VT (ventricular tachycardia) ([place])     Past Surgical History:  Procedure Laterality Date  . CARDIOVERSION  07/01/2010  . LEFT HEART CATHETERIZATION WITH CORONARY ANGIOGRAM N/A 01/16/2013   Procedure: LEFT HEART CATHETERIZATION WITH CORONARY ANGIOGRAM;  Surgeon: Angello Chien M [place], MD;  Location[facility] C[facility] LAB;  Service: Cardiovascular;  Laterality: N/A;  . REVERSE SHOULDER ARTHROPLASTY Right 06/20/2020   Procedure: REVERSE SHOULDER ARTHROPLASTY;  Surgeon: Justice Britain, MD;  Location: [facility] ORS;   Service: Orthopedics;  Laterality: Right;  145mn  . TOTAL KNEE ARTHROPLASTY  May 2009   Bilateral  . VASECTOMY      Social History   Tobacco Use  Smoking Status Never Smoker  Smokeless Tobacco Current User  . Types: Chew    Social History   Substance and Sexual Activity  Alcohol Use No    Family History  Problem Relation Age of Onset  . Alcohol abuse Brother   . Hypertension Brother   . Hypertension Mother     Review of Systems: The review of systems is per the HPI.  All other systems were reviewed and are negative.  Physical Exam: There were no vitals taken for this visit. GENERAL:  Well appearing, obese BM  in NAD HEENT:  PERRL, EOMI, sclera are clear. Oropharynx is clear. NECK:  No jugular venous distention, carotid upstroke brisk and symmetric, no bruits, no thyromegaly or adenopathy LUNGS:  Clear to auscultation bilaterally CHEST:  Unremarkable HEART:  IRRR,  PMI not displaced or sustained,S1 and S2 within normal limits, no S3, no S4: no clicks, no rubs, no murmurs ABD:  Soft, nontender. BS +, no masses or bruits. No hepatomegaly, no splenomegaly EXT:  2 + pulses throughout, 2+ edema, no cyanosis no clubbing SKIN:  Warm and dry.  No rashes NEURO:  Alert and oriented x 3. Cranial nerves II through XII intact. PSYCH:  Cognitively intact    Wt Readings from Last 3 Encounters:  06/20/20 (!) 330 lb 14.6 oz (150.1 kg)  06/11/20 (!) 331 lb (150.1 kg)  05/03/20 (!) 331 lb 9.6 oz (150.4 kg)     LABORATORY DATA:   Lab Results  Component Value Date   WBC 9.0 07/05/2020   HGB 10.9 (L) 07/05/2020   HCT 35.4 (L) 07/05/2020   PLT 448 07/05/2020   GLUCOSE 108 (H) 06/11/2020   CHOL 157 11/14/2019   TRIG 113 11/14/2019   HDL 45 11/14/2019   LDLCALC 92 11/14/2019   ALT 12 03/18/2020   AST 17 03/18/2020   NA 142 06/11/2020   K 4.8 06/11/2020   CL 105 06/11/2020   CREATININE 1.98 (H) 06/11/2020   BUN 25 (H) 06/11/2020   CO2 31 06/11/2020   INR 1.9 (A)  07/05/2020   HGBA1C 6.4 (H) 06/21/2020    Labs dated 09/21/16: potassium 3.6, BUN 24, creatinine 1.74. A1c 5.9%.  Dated 11/28/15: cholesterol 141, triglycerides 104, HDL 46, LDL 74.   Ecg today shows atrial flutter with rate 82 bpm.  T wave inversion c/w inferolateral ischemia. I have personally reviewed and interpreted this study.   Echo 05/29/12: Study Conclusions  - Left ventricle: Wall thickness was increased in a pattern of severe LVH. There is a suggestive of assymetric apical hypertrphy seen in some views. Systolic function was vigorous. The estimated ejection fraction was in the range of 65% to 70%. Doppler parameters are consistent with abnormal left ventricular relaxation (grade 1 diastolic dysfunction). - Left atrium: The atrium was moderately dilated.  Myoview 05/30/12: Study Result   Mr. Silverman is a 75 yo who was admitted with chest pain.  A 2 day Leane Call was ordered for further evaluation.  The patient received an IV injection of Myoview and the resting Myoview images were obtained following brief delay.  The following day the patient received an IV injection of Lexiscan. He had no ST-T wave changes during the injection.  A second dose of Myoview was injected after 45 seconds. Quantitated gated SPECT images were obtained following brief delay.  The raw data images reveal minimal motion artifact.  The stress Myoview images reveal fairly smooth and homogeneous uptake in all areas of the myocardium.  The resting Myoview images reveals similar pattern of smooth and homogeneous uptake of all areas of the myocardium.  The cine loop images reveal fairly normal thickening and contractility of the myocardium.  The computer algorithm calculates an end diastolic volume of 038 ml.  The end-systolic volume is 882 ml.  Ejection fraction is 37%. Actually into the left ventricular systolic function is higher than the neck - probably in the range of  55-60%.  The computer derived contours do not match  the area of uptake in the left ventricle.  Impression: No evidence of ischemia  The normal left ventricular systolic function by visual inspection.  The computer generated left ventricular function is moderately depressed but I think the computer is clearly underestimating the left ventricular contractility. An echocardiogram performed several days ago confirms normal left ventricular systolic function with an EF of 65-70%.  [name], Brooke Bonito., MD, [facility]    Cardiac cath 01/26/13: Cardiac Catheterization Procedure Note  Name: Aero Drummonds MRN: [id number] DOB: 30-Sep-1945  Procedure: Left Heart Cath, Selective Coronary Angiography, LV angiography  Indication: 75 yo BM with history of severe HTN presents with symptoms of increasing chest pain.                                   Procedural Details: The right wrist was prepped, draped, and anesthetized with 1% lidocaine. Using the modified Seldinger technique, a 5 French sheath was introduced into the right radial artery. 3 mg of verapamil was administered through the sheath, weight-based unfractionated heparin was administered intravenously. Standard Judkins catheters were used for selective coronary angiography and left ventriculography. Catheter exchanges were performed over an exchange length guidewire. There were no immediate procedural complications. A TR band was used for radial hemostasis at the completion of the procedure.  The patient was transferred to the post catheterization recovery area for further monitoring.  Procedural Findings: Hemodynamics: AO 145/77 mean 103 mm Hg LV 150/20 mm Hg  Coronary angiography: Coronary dominance: right  Left mainstem: Normal  Left anterior descending (LAD): Very large, normal.  Left circumflex (LCx): large, 3 OM branches, normal.  Right coronary artery (RCA): Very large, normal.  Left ventriculography: Left  ventricular systolic function is normal, LVEF is estimated at 60-65%, there is no significant mitral regurgitation   Final Conclusions:   1. Normal coronary anatomy. 2. Normal LV function.   Recommendations: Medical therapy with focus on BP control. Will add hydralazine to his prior meds.  Asher Torpey [facility] 01/16/2013, 5:01 PM  Echo 04/20/19:IMPRESSIONS    1. Normal LVEF with severe pulmonary hypertension and likely PA thrombus  in right PA, suggestive of pulmonary embolism. RV is only midly enlarged.  Findings communicated with Dr. Sloan Leiter.  2. Left ventricular ejection fraction, by visual estimation, is 65 to  70%. The left ventricle has hyperdynamic function. There is severely  increased left ventricular hypertrophy.  3. Right ventricular pressure overload.  4. The left ventricle has no regional wall motion abnormalities.  5. Global right ventricle has moderately reduced systolic function.The  right ventricular size is mildly enlarged. Right vetricular wall thickness  was not assessed.  6. Preserved RV apical function, consistent with McConnell's sign. RV  size normal to only slightly enlarged depending on imaging window.  7. Left atrial size was mildly dilated.  8. Right atrial size was moderately dilated.  9. Small pericardial effusion.  10. The pericardial effusion is circumferential.  11. The mitral valve is normal in structure. Trivial mitral valve  regurgitation.  12. The tricuspid valve is normal in structure.  13. The aortic valve is tricuspid. Aortic valve regurgitation is trivial.  Mild aortic valve sclerosis without stenosis.  14. Pulmonic regurgitation is moderate.  15. The pulmonic valve was grossly normal. Pulmonic valve regurgitation is  moderate.  16. Severely elevated pulmonary artery systolic pressure.  43. Suspect thromboembolism in the main pulmonary artery.  18. There is a mobile mass seen in the right PA highly suspicious for  thrombus.  Additional thrombus burden not excluded.  19.  The tricuspid regurgitant velocity is 3.74 m/s, and with an assumed  right atrial pressure of 15 mmHg, the estimated right ventricular systolic  pressure is severely elevated at 70.8 mmHg.  20. The inferior vena cava is dilated in size with <50% respiratory  variability, suggesting right atrial pressure of 15 mmHg.    Echo 06/10/20: IMPRESSIONS    1. Left ventricular ejection fraction, by estimation, is 60 to 65%. The  left ventricle has normal function. The left ventricle has no regional  wall motion abnormalities. There is severe left ventricular hypertrophy.  Left ventricular diastolic function  could not be evaluated.  2. Right ventricular systolic function is normal. The right ventricular  size is normal.  3. Left atrial size was moderately dilated.  4. Right atrial size was moderately dilated.  5. The pericardial effusion is posterior to the left ventricle.  6. The mitral valve is abnormal. Mild mitral valve regurgitation.  7. The aortic valve is tricuspid. Aortic valve regurgitation is not  visualized. Mild aortic valve sclerosis is present, with no evidence of  aortic valve stenosis.  8. Aortic dilatation noted. There is mild dilatation of the ascending  aorta, measuring 39 mm.  9. Cannot exclude a small PFO.   Comparison(s): Changes from prior study are noted. 04/20/2019: LVEF 65-70%,  RV Strain with likely thrombus in the right PA.   Assessment / Plan: 1. Hypertensive heart disease with severe LVH and chronic diastolic CHF - BPcontrol is fairly well controlled and he is on a good medical regimen. He does have chronic edema but this is unchanged.    Recommend sodium restriction.     2. Chronic diastolic CHF - volume status is stable today.   3. Obesity - focus on weight loss and healthy diet.   4. CKD stage 4 secondary to chronic HTN.  5. Atrial flutter. S/p DCCV in 2012. Recurrent in Jan 2021 and persistent. He is  asymptomatic. Rate is well controlled. Continue Toprol XL for rate control. He has a [place] Vasc score of 4 so is at high risk for embolic CVA. I have recommended long term anticoagulation. DOAC therapy is cost prohibitive. Recommend starting Coumadin. Will refer to pharmacy.    6. History of bilateral DVT and PE in setting of Covid 19 - Jan 2021. Treated acutely with TPA then Eliquis for 3 months. Significant right heart strain and pulmonary HTN noted at that time. Will update Echo now.  7. Rotator cuff injury right shoulder. If Echo looks OK will clear for surgery. Can interrupt Coumadin for 5 days for surgery.  I will follow up in 3 months.

## 2020-07-18 ENCOUNTER — Ambulatory Visit: Payer: Medicare HMO | Admitting: Cardiology

## 2020-07-18 ENCOUNTER — Ambulatory Visit (INDEPENDENT_AMBULATORY_CARE_PROVIDER_SITE_OTHER): Payer: Medicare HMO | Admitting: Pharmacist Clinician (PhC)/ Clinical Pharmacy Specialist

## 2020-07-18 ENCOUNTER — Other Ambulatory Visit: Payer: Self-pay

## 2020-07-18 DIAGNOSIS — Z7901 Long term (current) use of anticoagulants: Secondary | ICD-10-CM | POA: Diagnosis not present

## 2020-07-18 DIAGNOSIS — I4892 Unspecified atrial flutter: Secondary | ICD-10-CM | POA: Diagnosis not present

## 2020-07-18 DIAGNOSIS — I4891 Unspecified atrial fibrillation: Secondary | ICD-10-CM

## 2020-07-18 LAB — POCT INR: INR: 4.3 — AB (ref 2.0–3.0)

## 2020-07-18 NOTE — Patient Instructions (Signed)
No warfarin Friday April 7, then only 1/2 tablet Sunday April 8.  After that take 1.5 tablets daily except 1 tablet each Sunday and Wednesday.  Repeat INR in 2 weeks  Cranberries, Antibiotics, and Steroids will raise INR (International Normalized Ratio)  Blood thickness or thinness. Diarrhea will also raise INR.

## 2020-07-24 ENCOUNTER — Telehealth: Payer: Self-pay

## 2020-07-24 NOTE — Telephone Encounter (Signed)
Received a letter from [place] they received referral.They have been unable to schedule patient appointment.He has not returned their phone calls.

## 2020-08-02 ENCOUNTER — Ambulatory Visit (INDEPENDENT_AMBULATORY_CARE_PROVIDER_SITE_OTHER): Payer: Medicare HMO

## 2020-08-02 ENCOUNTER — Other Ambulatory Visit: Payer: Self-pay

## 2020-08-02 DIAGNOSIS — I4892 Unspecified atrial flutter: Secondary | ICD-10-CM | POA: Diagnosis not present

## 2020-08-02 DIAGNOSIS — Z7901 Long term (current) use of anticoagulants: Secondary | ICD-10-CM | POA: Diagnosis not present

## 2020-08-02 LAB — POCT INR: INR: 4 — AB (ref 2.0–3.0)

## 2020-08-02 NOTE — Patient Instructions (Signed)
Hold today only and then decrease to 1.5 tablets daily except 1 tablet each Sunday, Wednesday and Saturday.  Repeat INR in 4 weeks  Cranberries, Antibiotics, and Steroids will raise INR (International Normalized Ratio)  Blood thickness or thinness. Diarrhea will also raise INR.

## 2020-08-30 ENCOUNTER — Other Ambulatory Visit: Payer: Self-pay

## 2020-08-30 ENCOUNTER — Ambulatory Visit (INDEPENDENT_AMBULATORY_CARE_PROVIDER_SITE_OTHER): Payer: Medicare HMO

## 2020-08-30 DIAGNOSIS — Z7901 Long term (current) use of anticoagulants: Secondary | ICD-10-CM | POA: Diagnosis not present

## 2020-08-30 DIAGNOSIS — I4892 Unspecified atrial flutter: Secondary | ICD-10-CM | POA: Diagnosis not present

## 2020-08-30 LAB — POCT INR: INR: 2.9 (ref 2.0–3.0)

## 2020-08-30 NOTE — Patient Instructions (Signed)
Continue taking 1.5 tablets daily except 1 tablet each Sunday, Wednesday and Saturday.  Repeat INR in 6 weeks ? ?Cranberries, Antibiotics, and Steroids will raise INR (International Normalized Ratio)  Blood thickness or thinness. Diarrhea will also raise INR. ?

## 2020-09-11 ENCOUNTER — Other Ambulatory Visit: Payer: Self-pay | Admitting: *Deleted

## 2020-09-11 NOTE — Patient Outreach (Addendum)
[place] [facility]) Care Management  09/11/2020  Nathan Grant Sep 08, 1945 [id number]  [facility] Telephone Assessment/Screen for complex care,referral  Referral Date: 08/22/20 Referral Reason: screening for chronic, complex or disease management services Insurance: aetna Medicare   Abbey Chatters worked with him on 06/26/19 Noted documented unsuccessful outreaches x 3 by Orlean Patten RN   [facility] Unsuccessful outreach   Outreach attempt to the home number [phone] No answer. [facility] RN CM left HIPAA [facility] Portability and Accountability Act) compliant voicemail message along with CM's contact info.   Plan: [facility] RN CM scheduled this patient for another call attempt within 4-7 business days unsuccessful outreach letter mailed   Joelene Millin L. Lavina Hamman, RN, BSN, [place] Coordinator Office number [phone] Mobile number [phone]  Main [facility] number [phone] Fax number [phone]

## 2020-09-12 ENCOUNTER — Other Ambulatory Visit: Payer: Self-pay | Admitting: Registered Nurse

## 2020-09-12 DIAGNOSIS — I110 Hypertensive heart disease with heart failure: Secondary | ICD-10-CM

## 2020-09-16 ENCOUNTER — Other Ambulatory Visit: Payer: Self-pay | Admitting: *Deleted

## 2020-09-16 NOTE — Patient Outreach (Signed)
[place] [facility]) Care Management  09/16/2020  Lucius Wise 1946/03/11 [id number]   [facility] second unsuccessful outreach for Telephone Assessment/Screen for complex care referral  Referral Date: 08/22/20 Referral Reason: screening for chronic, complex or disease management services Insurance: aetna Medicare   Abbey Chatters worked with him on 06/26/19 Noted documented unsuccessful outreaches x 3 by Valeta Harms, AHM RN   [facility] Unsuccessful outreach   Outreach attempt to the home number [phone] No answer. [facility] RN CM left HIPAA [facility] Portability and Accountability Act) compliant voicemail message along with CM's contact info.   Plan: [facility] RN CM scheduled this patient for a thirdcall attempt within 4-7 business days unsuccessful outreach letter mailed on 09/11/20   [place]. Lavina Hamman, RN, BSN, [employer] Coordinator Office number [phone] Mobile number [phone]  Main [facility] number [phone] Fax number [phone]

## 2020-09-23 ENCOUNTER — Other Ambulatory Visit: Payer: Self-pay | Admitting: *Deleted

## 2020-09-23 NOTE — Patient Outreach (Signed)
[place] [facility]) Care Management  09/23/2020  Nathan Grant [birth year] [id number]   [facility] third unsuccessful outreach for Telephone Assessment/Screen for complex care referral   Referral Date: 08/22/20 Referral Reason: screening for chronic, complex or disease management services Insurance: aetna Medicare   Abbey Chatters worked with him on 06/26/19 Noted documented unsuccessful outreaches x 3 by Valeta Harms, AHM RN    [facility] Unsuccessful outreach     Outreach attempt to the home number [phone] No answer. [facility] RN CM left HIPAA [facility] Portability and Accountability Act) compliant voicemail message along with CM's contact info.   Plan: [facility] RN CM scheduled this patient for case closure per [facility] workflow Unsuccessful outreaches on 09/11/20, 09/16/20, 09/23/20 unsuccessful outreach letter mailed on 09/11/20     Joelene Millin L. Lavina Hamman, RN, BSN, [place] Coordinator Office number [phone] Mobile number [phone] Main [facility] number [phone] Fax number [phone]

## 2020-10-08 ENCOUNTER — Other Ambulatory Visit: Payer: Self-pay | Admitting: *Deleted

## 2020-10-08 NOTE — Patient Outreach (Signed)
[place] [facility] [facility]) Care Management  10/08/2020  Nathan Grant 11/11/1945 [id number]   [facility] Case closure   Referral Date: 08/22/20 Referral Reason: screening for chronic, complex or disease management services Insurance: aetna Medicare  Unsuccessful outreaches on 09/11/20, 09/16/20, 09/23/20 unsuccessful outreach letter mailed on 09/11/20 without a response   Plan [facility] RN CM will close case after no response from patient within 10 business days. Unable to reach Case closure letters sent to patient and MD  Joelene Millin L. Lavina Hamman, RN, BSN, [place] Coordinator Office number [phone] Mobile number [phone]  Main [facility] number [phone] Fax number [phone]

## 2020-10-11 ENCOUNTER — Ambulatory Visit (INDEPENDENT_AMBULATORY_CARE_PROVIDER_SITE_OTHER): Payer: Medicare HMO

## 2020-10-11 ENCOUNTER — Other Ambulatory Visit: Payer: Self-pay

## 2020-10-11 DIAGNOSIS — I4892 Unspecified atrial flutter: Secondary | ICD-10-CM | POA: Diagnosis not present

## 2020-10-11 DIAGNOSIS — Z7901 Long term (current) use of anticoagulants: Secondary | ICD-10-CM

## 2020-10-11 LAB — POCT INR: INR: 2.5 (ref 2.0–3.0)

## 2020-10-11 NOTE — Patient Instructions (Signed)
Continue taking 1.5 tablets daily except 1 tablet each Sunday, Wednesday and Saturday.  Repeat INR in 6 weeks ? ?Cranberries, Antibiotics, and Steroids will raise INR (International Normalized Ratio)  Blood thickness or thinness. Diarrhea will also raise INR. ?

## 2020-11-22 ENCOUNTER — Ambulatory Visit (INDEPENDENT_AMBULATORY_CARE_PROVIDER_SITE_OTHER): Payer: Medicare HMO

## 2020-11-22 ENCOUNTER — Telehealth: Payer: Self-pay | Admitting: Cardiology

## 2020-11-22 ENCOUNTER — Other Ambulatory Visit: Payer: Self-pay

## 2020-11-22 DIAGNOSIS — Z7901 Long term (current) use of anticoagulants: Secondary | ICD-10-CM

## 2020-11-22 DIAGNOSIS — I4892 Unspecified atrial flutter: Secondary | ICD-10-CM

## 2020-11-22 LAB — POCT INR: INR: 2.4 (ref 2.0–3.0)

## 2020-11-22 NOTE — Patient Instructions (Signed)
Continue taking 1.5 tablets daily except 1 tablet each Sunday, Wednesday and Saturday.  Repeat INR in 6 weeks ? ?Cranberries, Antibiotics, and Steroids will raise INR (International Normalized Ratio)  Blood thickness or thinness. Diarrhea will also raise INR. ?

## 2020-11-22 NOTE — Telephone Encounter (Signed)
Error

## 2020-12-14 ENCOUNTER — Other Ambulatory Visit: Payer: Self-pay | Admitting: Registered Nurse

## 2020-12-14 DIAGNOSIS — I110 Hypertensive heart disease with heart failure: Secondary | ICD-10-CM

## 2020-12-14 DIAGNOSIS — I5032 Chronic diastolic (congestive) heart failure: Secondary | ICD-10-CM

## 2021-01-03 ENCOUNTER — Other Ambulatory Visit: Payer: Self-pay

## 2021-01-03 ENCOUNTER — Ambulatory Visit (INDEPENDENT_AMBULATORY_CARE_PROVIDER_SITE_OTHER): Payer: Medicare HMO

## 2021-01-03 DIAGNOSIS — I4892 Unspecified atrial flutter: Secondary | ICD-10-CM | POA: Diagnosis not present

## 2021-01-03 DIAGNOSIS — Z7901 Long term (current) use of anticoagulants: Secondary | ICD-10-CM | POA: Diagnosis not present

## 2021-01-03 LAB — POCT INR: INR: 2.3 (ref 2.0–3.0)

## 2021-01-03 NOTE — Patient Instructions (Signed)
Continue taking 1.5 tablets daily except 1 tablet each Sunday, Wednesday and Saturday.  Repeat INR in 6 weeks ? ?Cranberries, Antibiotics, and Steroids will raise INR (International Normalized Ratio)  Blood thickness or thinness. Diarrhea will also raise INR. ?

## 2021-02-13 ENCOUNTER — Ambulatory Visit (INDEPENDENT_AMBULATORY_CARE_PROVIDER_SITE_OTHER): Payer: Medicare HMO

## 2021-02-13 ENCOUNTER — Other Ambulatory Visit: Payer: Self-pay

## 2021-02-13 DIAGNOSIS — I4892 Unspecified atrial flutter: Secondary | ICD-10-CM

## 2021-02-13 DIAGNOSIS — Z7901 Long term (current) use of anticoagulants: Secondary | ICD-10-CM

## 2021-02-13 LAB — POCT INR: INR: 2.3 (ref 2.0–3.0)

## 2021-02-13 NOTE — Patient Instructions (Signed)
Continue taking 1.5 tablets daily except 1 tablet each Sunday, Wednesday and Saturday.  Repeat INR in 6 weeks ? ?Cranberries, Antibiotics, and Steroids will raise INR (International Normalized Ratio)  Blood thickness or thinness. Diarrhea will also raise INR. ?

## 2021-03-20 ENCOUNTER — Other Ambulatory Visit: Payer: Self-pay | Admitting: Cardiology

## 2021-03-20 DIAGNOSIS — E785 Hyperlipidemia, unspecified: Secondary | ICD-10-CM | POA: Diagnosis not present

## 2021-03-20 DIAGNOSIS — I10 Essential (primary) hypertension: Secondary | ICD-10-CM | POA: Diagnosis not present

## 2021-03-20 DIAGNOSIS — Z0000 Encounter for general adult medical examination without abnormal findings: Secondary | ICD-10-CM | POA: Diagnosis not present

## 2021-03-20 DIAGNOSIS — I5032 Chronic diastolic (congestive) heart failure: Secondary | ICD-10-CM | POA: Diagnosis not present

## 2021-03-20 DIAGNOSIS — I4892 Unspecified atrial flutter: Secondary | ICD-10-CM | POA: Diagnosis not present

## 2021-03-20 DIAGNOSIS — Z125 Encounter for screening for malignant neoplasm of prostate: Secondary | ICD-10-CM | POA: Diagnosis not present

## 2021-03-20 DIAGNOSIS — N183 Chronic kidney disease, stage 3 unspecified: Secondary | ICD-10-CM | POA: Diagnosis not present

## 2021-03-20 DIAGNOSIS — R7303 Prediabetes: Secondary | ICD-10-CM | POA: Diagnosis not present

## 2021-03-27 ENCOUNTER — Ambulatory Visit (INDEPENDENT_AMBULATORY_CARE_PROVIDER_SITE_OTHER): Payer: Medicare HMO

## 2021-03-27 ENCOUNTER — Other Ambulatory Visit: Payer: Self-pay

## 2021-03-27 DIAGNOSIS — Z7901 Long term (current) use of anticoagulants: Secondary | ICD-10-CM | POA: Diagnosis not present

## 2021-03-27 DIAGNOSIS — I4892 Unspecified atrial flutter: Secondary | ICD-10-CM | POA: Diagnosis not present

## 2021-03-27 LAB — POCT INR: INR: 2 (ref 2.0–3.0)

## 2021-03-27 NOTE — Patient Instructions (Signed)
Continue taking 1.5 tablets daily except 1 tablet each Sunday, Wednesday and Saturday.  Repeat INR in 6 weeks  Cranberries, Antibiotics, and Steroids will raise INR (International Normalized Ratio)  Blood thickness or thinness. Diarrhea will also raise INR.

## 2021-05-08 ENCOUNTER — Other Ambulatory Visit: Payer: Self-pay

## 2021-05-08 ENCOUNTER — Ambulatory Visit (INDEPENDENT_AMBULATORY_CARE_PROVIDER_SITE_OTHER): Payer: Medicare HMO

## 2021-05-08 DIAGNOSIS — I4892 Unspecified atrial flutter: Secondary | ICD-10-CM

## 2021-05-08 DIAGNOSIS — Z7901 Long term (current) use of anticoagulants: Secondary | ICD-10-CM

## 2021-05-08 LAB — POCT INR: INR: 2.2 (ref 2.0–3.0)

## 2021-05-08 NOTE — Patient Instructions (Signed)
Continue taking 1.5 tablets daily except 1 tablet each Sunday, Wednesday and Saturday.  Repeat INR in 6 weeks  Cranberries, Antibiotics, and Steroids will raise INR (International Normalized Ratio)  Blood thickness or thinness. Diarrhea will also raise INR.

## 2021-06-19 DIAGNOSIS — R7303 Prediabetes: Secondary | ICD-10-CM | POA: Diagnosis not present

## 2021-06-19 DIAGNOSIS — N184 Chronic kidney disease, stage 4 (severe): Secondary | ICD-10-CM | POA: Diagnosis not present

## 2021-06-19 DIAGNOSIS — I5032 Chronic diastolic (congestive) heart failure: Secondary | ICD-10-CM | POA: Diagnosis not present

## 2021-06-19 DIAGNOSIS — I10 Essential (primary) hypertension: Secondary | ICD-10-CM | POA: Diagnosis not present

## 2021-06-19 DIAGNOSIS — Z5986 Financial insecurity: Secondary | ICD-10-CM | POA: Diagnosis not present

## 2021-06-19 DIAGNOSIS — I4892 Unspecified atrial flutter: Secondary | ICD-10-CM | POA: Diagnosis not present

## 2021-06-19 DIAGNOSIS — R972 Elevated prostate specific antigen [PSA]: Secondary | ICD-10-CM | POA: Diagnosis not present

## 2021-06-20 ENCOUNTER — Ambulatory Visit (INDEPENDENT_AMBULATORY_CARE_PROVIDER_SITE_OTHER): Payer: Medicare HMO

## 2021-06-20 ENCOUNTER — Other Ambulatory Visit: Payer: Self-pay

## 2021-06-20 DIAGNOSIS — Z7901 Long term (current) use of anticoagulants: Secondary | ICD-10-CM | POA: Diagnosis not present

## 2021-06-20 DIAGNOSIS — I4892 Unspecified atrial flutter: Secondary | ICD-10-CM

## 2021-06-20 LAB — POCT INR: INR: 2.8 (ref 2.0–3.0)

## 2021-06-20 NOTE — Patient Instructions (Signed)
Continue taking 1.5 tablets daily except 1 tablet each Sunday, Wednesday and Saturday.  Repeat INR in 6 weeks ? ?Cranberries, Antibiotics, and Steroids will raise INR (International Normalized Ratio)  Blood thickness or thinness. Diarrhea will also raise INR. ?

## 2021-08-04 ENCOUNTER — Ambulatory Visit (INDEPENDENT_AMBULATORY_CARE_PROVIDER_SITE_OTHER): Payer: Medicare HMO

## 2021-08-04 DIAGNOSIS — Z7901 Long term (current) use of anticoagulants: Secondary | ICD-10-CM

## 2021-08-04 DIAGNOSIS — I4892 Unspecified atrial flutter: Secondary | ICD-10-CM

## 2021-08-04 LAB — POCT INR: INR: 2.1 (ref 2.0–3.0)

## 2021-08-04 NOTE — Patient Instructions (Signed)
Continue taking 1.5 tablets daily except 1 tablet each Sunday, Wednesday and Saturday.  Repeat INR in 6 weeks ? ?Cranberries, Antibiotics, and Steroids will raise INR (International Normalized Ratio)  Blood thickness or thinness. Diarrhea will also raise INR. ?

## 2021-08-15 ENCOUNTER — Other Ambulatory Visit: Payer: Self-pay | Admitting: Cardiology

## 2021-08-15 ENCOUNTER — Telehealth: Payer: Self-pay | Admitting: Cardiology

## 2021-08-15 NOTE — Telephone Encounter (Signed)
?*  STAT* If patient is at the pharmacy, call can be transferred to refill team.   1. Which medications need to be refilled? (please list name of each medication and dose if known) warfarin (COUMADIN) 5 MG tablet   2. Which pharmacy/location (including street and city if local pharmacy) is medication to be sent to?  [facility] - [place], [place] - [address]    3. Do they need a 30 day or 90 day supply? 90  

## 2021-08-18 NOTE — Telephone Encounter (Signed)
Patient is requesting Warfarin 5 mg [facility] to [facility] at [facility] with a 90 day supply ?

## 2021-08-19 MED ORDER — WARFARIN SODIUM 5 MG PO TABS: ORAL_TABLET | ORAL | 1 refills | Status: DC

## 2021-09-15 ENCOUNTER — Ambulatory Visit (INDEPENDENT_AMBULATORY_CARE_PROVIDER_SITE_OTHER): Payer: Medicare HMO

## 2021-09-15 DIAGNOSIS — I4892 Unspecified atrial flutter: Secondary | ICD-10-CM | POA: Diagnosis not present

## 2021-09-15 DIAGNOSIS — Z7901 Long term (current) use of anticoagulants: Secondary | ICD-10-CM | POA: Diagnosis not present

## 2021-09-15 DIAGNOSIS — H524 Presbyopia: Secondary | ICD-10-CM | POA: Diagnosis not present

## 2021-09-15 LAB — POCT INR: INR: 2.5 (ref 2.0–3.0)

## 2021-09-15 NOTE — Patient Instructions (Signed)
Continue taking 1.5 tablets daily except 1 tablet each Sunday, Wednesday and Saturday.  Repeat INR in 6 weeks  Cranberries, Antibiotics, and Steroids will raise INR (International Normalized Ratio)  Blood thickness or thinness. Diarrhea will also raise INR.

## 2021-09-18 DIAGNOSIS — R972 Elevated prostate specific antigen [PSA]: Secondary | ICD-10-CM | POA: Diagnosis not present

## 2021-09-18 DIAGNOSIS — R7303 Prediabetes: Secondary | ICD-10-CM | POA: Diagnosis not present

## 2021-09-18 DIAGNOSIS — I4892 Unspecified atrial flutter: Secondary | ICD-10-CM | POA: Diagnosis not present

## 2021-09-18 DIAGNOSIS — N184 Chronic kidney disease, stage 4 (severe): Secondary | ICD-10-CM | POA: Diagnosis not present

## 2021-09-18 DIAGNOSIS — I5032 Chronic diastolic (congestive) heart failure: Secondary | ICD-10-CM | POA: Diagnosis not present

## 2021-09-18 DIAGNOSIS — I10 Essential (primary) hypertension: Secondary | ICD-10-CM | POA: Diagnosis not present

## 2021-10-02 DIAGNOSIS — I10 Essential (primary) hypertension: Secondary | ICD-10-CM | POA: Diagnosis not present

## 2021-10-02 DIAGNOSIS — G4733 Obstructive sleep apnea (adult) (pediatric): Secondary | ICD-10-CM | POA: Diagnosis not present

## 2021-10-24 DIAGNOSIS — G4710 Hypersomnia, unspecified: Secondary | ICD-10-CM | POA: Diagnosis not present

## 2021-10-24 DIAGNOSIS — I10 Essential (primary) hypertension: Secondary | ICD-10-CM | POA: Diagnosis not present

## 2021-10-24 DIAGNOSIS — N184 Chronic kidney disease, stage 4 (severe): Secondary | ICD-10-CM | POA: Diagnosis not present

## 2021-10-24 DIAGNOSIS — I5032 Chronic diastolic (congestive) heart failure: Secondary | ICD-10-CM | POA: Diagnosis not present

## 2021-10-24 DIAGNOSIS — Z6841 Body Mass Index (BMI) 40.0 and over, adult: Secondary | ICD-10-CM | POA: Diagnosis not present

## 2021-10-27 ENCOUNTER — Ambulatory Visit (INDEPENDENT_AMBULATORY_CARE_PROVIDER_SITE_OTHER): Payer: Medicare HMO

## 2021-10-27 DIAGNOSIS — I4891 Unspecified atrial fibrillation: Secondary | ICD-10-CM | POA: Diagnosis not present

## 2021-10-27 DIAGNOSIS — Z7901 Long term (current) use of anticoagulants: Secondary | ICD-10-CM

## 2021-10-27 DIAGNOSIS — I4892 Unspecified atrial flutter: Secondary | ICD-10-CM | POA: Diagnosis not present

## 2021-10-27 LAB — POCT INR: INR: 1.7 — AB (ref 2.0–3.0)

## 2021-10-27 NOTE — Patient Instructions (Signed)
Description   Take an extra 0.5 tablet  today and then continue taking 1.5 tablets daily except 1 tablet each Sunday, Wednesday and Saturday. Repeat INR in 6 weeks. Cranberries, Antibiotics, and Steroids will raise INR (International Normalized Ratio)  Blood thickness or thinness. Diarrhea will also raise INR.

## 2021-12-08 ENCOUNTER — Ambulatory Visit: Payer: Medicare HMO | Attending: Cardiology

## 2021-12-08 DIAGNOSIS — Z7901 Long term (current) use of anticoagulants: Secondary | ICD-10-CM

## 2021-12-08 DIAGNOSIS — I4892 Unspecified atrial flutter: Secondary | ICD-10-CM | POA: Diagnosis not present

## 2021-12-08 LAB — POCT INR: INR: 2.5 (ref 2.0–3.0)

## 2021-12-08 NOTE — Patient Instructions (Signed)
continue taking 1.5 tablets daily except 1 tablet each Sunday, Wednesday and Saturday. Repeat INR in 6 weeks. Cranberries, Antibiotics, and Steroids will raise INR (International Normalized Ratio)  Blood thickness or thinness. Diarrhea will also raise INR. 

## 2021-12-29 DIAGNOSIS — D6869 Other thrombophilia: Secondary | ICD-10-CM | POA: Diagnosis not present

## 2021-12-29 DIAGNOSIS — Z809 Family history of malignant neoplasm, unspecified: Secondary | ICD-10-CM | POA: Diagnosis not present

## 2021-12-29 DIAGNOSIS — I509 Heart failure, unspecified: Secondary | ICD-10-CM | POA: Diagnosis not present

## 2021-12-29 DIAGNOSIS — Z7901 Long term (current) use of anticoagulants: Secondary | ICD-10-CM | POA: Diagnosis not present

## 2021-12-29 DIAGNOSIS — N1832 Chronic kidney disease, stage 3b: Secondary | ICD-10-CM | POA: Diagnosis not present

## 2021-12-29 DIAGNOSIS — I130 Hypertensive heart and chronic kidney disease with heart failure and stage 1 through stage 4 chronic kidney disease, or unspecified chronic kidney disease: Secondary | ICD-10-CM | POA: Diagnosis not present

## 2021-12-29 DIAGNOSIS — Z6841 Body Mass Index (BMI) 40.0 and over, adult: Secondary | ICD-10-CM | POA: Diagnosis not present

## 2021-12-29 DIAGNOSIS — E785 Hyperlipidemia, unspecified: Secondary | ICD-10-CM | POA: Diagnosis not present

## 2021-12-29 DIAGNOSIS — I4891 Unspecified atrial fibrillation: Secondary | ICD-10-CM | POA: Diagnosis not present

## 2021-12-29 DIAGNOSIS — N529 Male erectile dysfunction, unspecified: Secondary | ICD-10-CM | POA: Diagnosis not present

## 2021-12-29 DIAGNOSIS — Z7982 Long term (current) use of aspirin: Secondary | ICD-10-CM | POA: Diagnosis not present

## 2022-01-01 ENCOUNTER — Telehealth: Payer: Self-pay

## 2022-01-01 DIAGNOSIS — I4891 Unspecified atrial fibrillation: Secondary | ICD-10-CM

## 2022-01-01 MED ORDER — WARFARIN SODIUM 5 MG PO TABS: ORAL_TABLET | ORAL | 0 refills | Status: DC

## 2022-01-01 NOTE — Telephone Encounter (Signed)
Patient called to request refill on Coumadin.

## 2022-01-01 NOTE — Telephone Encounter (Signed)
Prescription refill request received for warfarin Lov: 05/03/20 ([place])  Next INR check: 01/19/22 Warfarin tablet strength: '5mg'$   Pt overdue to see provider. Placed note on upcoming Anticoagulation appt to have pt schedule an appt. Appropriate dose and refill sent to requested pharmacy.

## 2022-01-02 ENCOUNTER — Telehealth: Payer: Self-pay | Admitting: *Deleted

## 2022-01-02 NOTE — Telephone Encounter (Addendum)
[facility] faxed paperwork stating that they will be switching manufactuers of warfarin. Called pharmacy and informed them that is okay. Called and made pt aware. Informed him that switching warfarin manufactures can sometimes have an effect on his INR. Pt stated that he has a about a week left of his current warfarin prescription. Then pt will start the new prescription of warfarin. Pt already scheduled to come in and have INR checked on 01/19/2022, reminded pt of appointment and the importance of coming.   Pt will have been on new prescription for a little over a week when he has INR checked.

## 2022-01-19 ENCOUNTER — Ambulatory Visit: Payer: Medicare HMO | Attending: Cardiology

## 2022-01-19 DIAGNOSIS — Z7901 Long term (current) use of anticoagulants: Secondary | ICD-10-CM

## 2022-01-19 DIAGNOSIS — I4892 Unspecified atrial flutter: Secondary | ICD-10-CM

## 2022-01-19 LAB — POCT INR: INR: 2.4 (ref 2.0–3.0)

## 2022-01-19 NOTE — Patient Instructions (Signed)
continue taking 1.5 tablets daily except 1 tablet each Sunday, Wednesday and Saturday. Repeat INR in 6 weeks. Cranberries, Antibiotics, and Steroids will raise INR (International Normalized Ratio)  Blood thickness or thinness. Diarrhea will also raise INR. 

## 2022-01-26 DIAGNOSIS — N184 Chronic kidney disease, stage 4 (severe): Secondary | ICD-10-CM | POA: Diagnosis not present

## 2022-01-26 DIAGNOSIS — R972 Elevated prostate specific antigen [PSA]: Secondary | ICD-10-CM | POA: Diagnosis not present

## 2022-01-26 DIAGNOSIS — I10 Essential (primary) hypertension: Secondary | ICD-10-CM | POA: Diagnosis not present

## 2022-01-26 DIAGNOSIS — R7303 Prediabetes: Secondary | ICD-10-CM | POA: Diagnosis not present

## 2022-01-26 DIAGNOSIS — G4733 Obstructive sleep apnea (adult) (pediatric): Secondary | ICD-10-CM | POA: Diagnosis not present

## 2022-02-16 DIAGNOSIS — M25539 Pain in unspecified wrist: Secondary | ICD-10-CM | POA: Diagnosis not present

## 2022-02-16 DIAGNOSIS — N184 Chronic kidney disease, stage 4 (severe): Secondary | ICD-10-CM | POA: Diagnosis not present

## 2022-02-16 DIAGNOSIS — I10 Essential (primary) hypertension: Secondary | ICD-10-CM | POA: Diagnosis not present

## 2022-02-16 DIAGNOSIS — D638 Anemia in other chronic diseases classified elsewhere: Secondary | ICD-10-CM | POA: Diagnosis not present

## 2022-03-02 ENCOUNTER — Ambulatory Visit: Payer: Medicare HMO

## 2022-03-12 ENCOUNTER — Ambulatory Visit: Payer: Medicare HMO

## 2022-03-13 ENCOUNTER — Institutional Professional Consult (permissible substitution): Payer: Medicare HMO | Admitting: Nurse Practitioner

## 2022-03-17 ENCOUNTER — Ambulatory Visit: Payer: Medicare HMO | Attending: Cardiology

## 2022-03-17 DIAGNOSIS — I4892 Unspecified atrial flutter: Secondary | ICD-10-CM

## 2022-03-17 DIAGNOSIS — Z7901 Long term (current) use of anticoagulants: Secondary | ICD-10-CM | POA: Diagnosis not present

## 2022-03-17 DIAGNOSIS — Z5181 Encounter for therapeutic drug level monitoring: Secondary | ICD-10-CM | POA: Diagnosis not present

## 2022-03-17 LAB — POCT INR: INR: 3 (ref 2.0–3.0)

## 2022-03-17 NOTE — Patient Instructions (Signed)
continue taking 1.5 tablets daily except 1 tablet each Sunday, Wednesday and Saturday. Repeat INR in 6 weeks. Cranberries, Antibiotics, and Steroids will raise INR (International Normalized Ratio)  Blood thickness or thinness. Diarrhea will also raise INR.

## 2022-03-19 ENCOUNTER — Encounter: Payer: Self-pay | Admitting: Nurse Practitioner

## 2022-03-19 ENCOUNTER — Ambulatory Visit (INDEPENDENT_AMBULATORY_CARE_PROVIDER_SITE_OTHER): Payer: Medicare HMO | Admitting: Nurse Practitioner

## 2022-03-19 VITALS — BP 140/90 | HR 69 | Temp 98.7°F | Ht 73.0 in | Wt 345.2 lb

## 2022-03-19 DIAGNOSIS — G4719 Other hypersomnia: Secondary | ICD-10-CM

## 2022-03-19 DIAGNOSIS — R0683 Snoring: Secondary | ICD-10-CM

## 2022-03-19 NOTE — Patient Instructions (Addendum)
Given your symptoms, I am concerned that you may have sleep disordered breathing with obstructive sleep apnea. You will need a sleep study for further evaluation. Someone will contact you to schedule this.  We discussed how untreated sleep apnea puts an individual at risk for cardiac arrhthymias, pulm HTN, DM, stroke and increases their risk for daytime accidents. We also briefly reviewed treatment options including weight loss, side sleeping position, oral appliance, CPAP therapy or referral to ENT for possible surgical options  Use caution when driving and pull over if you become sleepy  Follow up in 14 weeks with Katie Shabazz Mckey,NP to review sleep study results or sooner if needed

## 2022-03-19 NOTE — Progress Notes (Signed)
_0  ID: Nathan Grant, male    DOB: October 18, 1945, 76 y.o.   MRN: [id number]  No chief complaint on file.   Referring provider: Annye English  HPI: 76 year old male, never smoker referred for sleep consult. Past medical history significant for HTN, NSVT, a fib on coumadin, CHF, CKD stage 3, DM II, ED, HLD, obesity. He had COVID in 2021 requiring hospitalization due to acute respiratory failure secondary to pneumonia and pulmonary embolism.   TEST/EVENTS:   03/19/2022: Today - sleep consult Patient presents today for sleep consult.  He was referred by Daisy Lazar, PA.  He has had trouble with sleeping at night for many years. Wakes often. Tends to only sleep in 2-4 hour stretches. He has been told that he snores. Never told that he stops breathing when he sleeps. He does have some trouble with feeling like his nostrils close when he sleeps on his side. Wakes most mornings with dry mouth. Feels more fatigued during the day. Denies drowsy driving, sleep parasomnias/paralysis, morning headaches. No history of narcolepsy or cataplexy. No previous sleep studies.  He goes to bed around 8 PM.  Usually falls asleep quickly.  Wakes frequently throughout the night.  Officially gets up around 4 to 5 AM.  No sleep aids.  Occasionally drinks coffee to help keep him awake.  His weight is up about 20 to 30 pounds in the last 2 years. He has a history of high blood pressure, atrial fibrillation and diabetes.  Currently well-controlled on medications.  No history of stroke. He is a retired Administrator. He is a never smoker; chews tobacco. Never drinks alcohol. Lives at home with his wife. No significant family history.   Epworth 4   Allergies  Allergen Reactions   Nsaids Other (See Comments)    Due to hypertension issues   Ibuprofen Hypertension    Patient stopped taking on his own due to his high blood pressure.    There is no immunization history for the selected administration types on  file for this patient.  Past Medical History:  Diagnosis Date   Atrial flutter ([place])    s/p cardioversion 06/2010   Bradycardia    a. Accelerated junctional & sinus bradycardia during 01/2013 adm.   Bradycardia    Chronic headaches    Degenerative joint disease     End-stage degenerative joint disease, left knee   Dysrhythmia    Afib/A fltter   Erectile dysfunction    HTN (hypertension)    Hypertensive heart disease    a. Admit for CP 01/2013 felt due to this. Cath with normal coronary anatomy.   Microcytic anemia  03/22/2001   Morbid obesity ([place])    NSVT (nonsustained ventricular tachycardia) ([facility] [facility])    a. During 01/2013 adm.   Obesity    Osteoarthritis    Tendonitis    Tobacco chew use     History of chewing tobacco usage.    Urinary tract infection     Pseudomonas urinary tract infection   VT (ventricular tachycardia) ([facility])     Tobacco History: Social History   Tobacco Use  Smoking Status Never  Smokeless Tobacco Current   Types: Chew   Ready to quit: Not Answered Counseling given: Not Answered   Outpatient Medications Prior to Visit  Medication Sig Dispense Refill   albuterol (VENTOLIN HFA) 108 (90 Base) MCG/ACT inhaler Inhale 2 puffs into the lungs every 6 (six) hours as needed for wheezing. 18 g 0   amLODipine (  NORVASC) 10 MG tablet TAKE ONE (1) TABLET BY MOUTH ONCE DAILY (Patient taking differently: Take 10 mg by mouth daily.) 90 tablet 3   amLODipine (NORVASC) 10 MG tablet Take 1 tablet by mouth daily.     Ascorbic Acid (VITAMIN C) 1000 MG tablet Take 1,000 mg by mouth daily.     blood glucose meter kit and supplies Dispense based on patient and insurance preference. Use up to four times daily as directed. (FOR ICD-10 E10.9, E11.9). 1 each 0   Blood Glucose Monitoring Suppl (ACCU-CHEK GUIDE ME) w/Device KIT USE METER TO CHECK GLUCOSE THREE TIMES DAILY BEFORE MEAL(S) 1 kit 3   chlorthalidone (HYGROTON) 50 MG tablet Take 1 tablet by mouth daily.      cyclobenzaprine (FLEXERIL) 10 MG tablet Take 1 tablet (10 mg total) by mouth 3 (three) times daily as needed for muscle spasms. 30 tablet 1   ferrous sulfate 324 (65 Fe) MG TBEC Take 1 tablet (325 mg total) by mouth 2 (two) times daily. (Patient taking differently: Take 325 mg by mouth daily.) 180 tablet 3   fluticasone (FLONASE) 50 MCG/ACT nasal spray Place 2 sprays into both nostrils daily. (Patient taking differently: Place 2 sprays into both nostrils daily as needed for allergies.) 16 g 6   furosemide (LASIX) 40 MG tablet Take 1 tablet by mouth once daily 30 tablet 0   guaiFENesin (MUCINEX) 600 MG 12 hr tablet Take 600 mg by mouth 2 (two) times daily as needed for cough.     lidocaine (LIDODERM) 5 % Place onto the skin.     lisinopril (ZESTRIL) 10 MG tablet Take 1 tablet by mouth daily.     losartan (COZAAR) 100 MG tablet Take 1 tablet (100 mg total) by mouth daily. 90 tablet 3   metoprolol succinate (TOPROL-XL) 25 MG 24 hr tablet TAKE ONE (1) TABLET BY MOUTH ONCE DAILY (Patient taking differently: Take 25 mg by mouth daily. TAKE ONE (1) TABLET BY MOUTH ONCE DAILY) 90 tablet 3   metoprolol succinate (TOPROL-XL) 25 MG 24 hr tablet Take 1 tablet by mouth daily.     ondansetron (ZOFRAN) 4 MG tablet Take 1 tablet (4 mg total) by mouth every 8 (eight) hours as needed for nausea or vomiting. 10 tablet 0   oxycodone (OXY-IR) 5 MG capsule Take 1 capsule (5 mg total) by mouth at bedtime as needed. 20 capsule 0   pravastatin (PRAVACHOL) 40 MG tablet Take 1 tablet (40 mg total) by mouth daily. 90 tablet 3   pravastatin (PRAVACHOL) 40 MG tablet Take 1 tablet by mouth daily.     predniSONE (STERAPRED UNI-PAK 21 TAB) 5 MG (21) TBPK tablet See admin instructions.     warfarin (COUMADIN) 5 MG tablet Take 1 to 1.5 tablets by mouth daily as directed by coumadin clinic 120 tablet 0   No facility-administered medications prior to visit.     Review of Systems:   Constitutional: No weight loss or gain, night  sweats, fevers, chills, or lassitude. +excessive daytime fatigue  HEENT: No headaches, difficulty swallowing, tooth/dental problems, or sore throat. No sneezing, itching, ear ache, nasal congestion, or post nasal drip. +AM dry mouth CV:  No chest pain, orthopnea, PND, swelling in lower extremities, anasarca, dizziness, palpitations, syncope Resp: +snoring; shortness of breath with exertion (baseline). No excess mucus or change in color of mucus. No productive or non-productive cough. No hemoptysis. No wheezing.  No chest wall deformity GI:  No heartburn, indigestion, abdominal pain GU: No dysuria, change  in color of urine, urgency or frequency, nocturia Skin: No rash, lesions, ulcerations MSK:  No joint pain or swelling.   Neuro: No dizziness or lightheadedness.  Psych: No depression or anxiety. Mood stable. +sleep disturbance    Physical Exam:  BP (!) 140/90 (BP Location: Right Arm, Patient Position: Sitting, Cuff Size: Large)   Pulse 69   Temp 98.7 F (37.1 C) (Oral)   Ht _0  (1.854 m)   Wt (!) 345 lb 3.2 oz (156.6 kg)   SpO2 98%   BMI 45.54 kg/m   GEN: Pleasant, interactive, well-appearing; obese; in no acute distress. HEENT:  Normocephalic and atraumatic. PERRLA. Sclera white. Nasal turbinates pink, moist and patent bilaterally. No rhinorrhea present. Oropharynx pink and moist, without exudate or edema. No lesions, ulcerations, or postnasal drip. Mallampati III NECK:  Supple w/ fair ROM. No JVD present. Normal carotid impulses w/o bruits. Thyroid symmetrical with no goiter or nodules palpated. No lymphadenopathy.   CV: RRR, no m/r/g, no peripheral edema. Pulses intact, +2 bilaterally. No cyanosis, pallor or clubbing. PULMONARY:  Unlabored, regular breathing. Clear bilaterally A&P w/o wheezes/rales/rhonchi. No accessory muscle use.  GI: BS present and normoactive. Soft, non-tender to palpation. No organomegaly or masses detected.  MSK: No erythema, warmth or tenderness. Cap refil  <2 sec all extrem. No deformities or joint swelling noted.  Neuro: A/Ox3. No focal deficits noted.   Skin: Warm, no lesions or rashe Psych: Normal affect and behavior. Judgement and thought content appropriate.     Lab Results:  CBC    Component Value Date/Time   WBC 9.0 07/05/2020 0000   WBC 4.2 06/11/2020 0932   RBC 4.82 07/05/2020 0000   RBC 5.08 06/11/2020 0932   HGB 10.9 (L) 07/05/2020 0000   HCT 35.4 (L) 07/05/2020 0000   PLT 448 07/05/2020 0000   MCV 73 (L) 07/05/2020 0000   MCH 22.6 (L) 07/05/2020 0000   MCH 23.0 (L) 06/11/2020 0932   MCHC 30.8 (L) 07/05/2020 0000   MCHC 30.4 06/11/2020 0932   RDW 16.4 (H) 07/05/2020 0000   LYMPHSABS 1.4 03/18/2020 0943   MONOABS 1.1 (H) 04/24/2019 0505   EOSABS 0.3 03/18/2020 0943   BASOSABS 0.1 03/18/2020 0943    BMET    Component Value Date/Time   NA 142 06/11/2020 0932   NA 140 03/18/2020 0943   K 4.8 06/11/2020 0932   CL 105 06/11/2020 0932   CO2 31 06/11/2020 0932   GLUCOSE 108 (H) 06/11/2020 0932   BUN 25 (H) 06/11/2020 0932   BUN 30 (H) 03/18/2020 0943   CREATININE 1.98 (H) 06/11/2020 0932   CALCIUM 9.0 06/11/2020 0932   GFRNONAA 35 (L) 06/11/2020 0932   GFRAA 28 (L) 03/18/2020 0943    BNP    Component Value Date/Time   BNP 413.0 (H) 04/19/2019 1820     Imaging:  No results found.        No data to display          No results found for: "NITRICOXIDE"      Assessment & Plan:   Excessive daytime sleepiness He has snoring, excessive daytime sleepiness, AM dry mouth, disrupted sleep. BMI 45. History of HTN, cardiac arrhythmias, CHF, DM. Epworth 4. Given this,  I am concerned he could have sleep disordered breathing with obstructive sleep apnea. He will need sleep study for further evaluation.    - discussed how weight can impact sleep and risk for sleep disordered breathing - discussed options to assist with weight  loss: combination of diet modification, cardiovascular and strength training  exercises   - had an extensive discussion regarding the adverse health consequences related to untreated sleep disordered breathing - specifically discussed the risks for hypertension, coronary artery disease, cardiac dysrhythmias, cerebrovascular disease, and diabetes - lifestyle modification discussed   - discussed how sleep disruption can increase risk of accidents, particularly when driving - safe driving practices were discussed   Patient Instructions  Given your symptoms, I am concerned that you may have sleep disordered breathing with obstructive sleep apnea. You will need a sleep study for further evaluation. Someone will contact you to schedule this.  We discussed how untreated sleep apnea puts an individual at risk for cardiac arrhthymias, pulm HTN, DM, stroke and increases their risk for daytime accidents. We also briefly reviewed treatment options including weight loss, side sleeping position, oral appliance, CPAP therapy or referral to ENT for possible surgical options  Use caution when driving and pull over if you become sleepy  Follow up in 14 weeks with Joellen Jersey Jennesis Ramaswamy,NP to review sleep study results or sooner if needed    Snoring See above.  Morbid obesity BMI 45. Healthy weight loss encouraged.    I spent 35 minutes of dedicated to the care of this patient on the date of this encounter to include pre-visit review of records, face-to-face time with the patient discussing conditions above, post visit ordering of testing, clinical documentation with the electronic health record, making appropriate referrals as documented, and communicating necessary findings to members of the patients care team.  Clayton Bibles, NP 03/19/2022  Pt aware and understands NP's role.

## 2022-03-19 NOTE — Assessment & Plan Note (Signed)
BMI 45. Healthy weight loss encouraged.

## 2022-03-19 NOTE — Assessment & Plan Note (Signed)
See above

## 2022-03-19 NOTE — Assessment & Plan Note (Addendum)
He has snoring, excessive daytime sleepiness, AM dry mouth, disrupted sleep. BMI 45. History of HTN, cardiac arrhythmias, CHF, DM. Epworth 4. Given this,  I am concerned he could have sleep disordered breathing with obstructive sleep apnea. He will need sleep study for further evaluation.    - discussed how weight can impact sleep and risk for sleep disordered breathing - discussed options to assist with weight loss: combination of diet modification, cardiovascular and strength training exercises   - had an extensive discussion regarding the adverse health consequences related to untreated sleep disordered breathing - specifically discussed the risks for hypertension, coronary artery disease, cardiac dysrhythmias, cerebrovascular disease, and diabetes - lifestyle modification discussed   - discussed how sleep disruption can increase risk of accidents, particularly when driving - safe driving practices were discussed   Patient Instructions  Given your symptoms, I am concerned that you may have sleep disordered breathing with obstructive sleep apnea. You will need a sleep study for further evaluation. Someone will contact you to schedule this.  We discussed how untreated sleep apnea puts an individual at risk for cardiac arrhthymias, pulm HTN, DM, stroke and increases their risk for daytime accidents. We also briefly reviewed treatment options including weight loss, side sleeping position, oral appliance, CPAP therapy or referral to ENT for possible surgical options  Use caution when driving and pull over if you become sleepy  Follow up in 14 weeks with Nathan Raigen Jagielski,NP to review sleep study results or sooner if needed

## 2022-03-23 NOTE — Progress Notes (Signed)
Reviewed and agree with assessment/plan.   Chesley Mires, MD [facility] Pulmonary/Critical Care 03/23/2022, 7:03 AM Pager:  [phone]

## 2022-04-18 ENCOUNTER — Other Ambulatory Visit: Payer: Self-pay | Admitting: Cardiology

## 2022-04-18 DIAGNOSIS — I4891 Unspecified atrial fibrillation: Secondary | ICD-10-CM

## 2022-04-22 ENCOUNTER — Telehealth: Payer: Self-pay | Admitting: Cardiology

## 2022-04-22 DIAGNOSIS — I4891 Unspecified atrial fibrillation: Secondary | ICD-10-CM

## 2022-04-22 MED ORDER — WARFARIN SODIUM 5 MG PO TABS: ORAL_TABLET | ORAL | 0 refills | Status: AC

## 2022-04-22 NOTE — Telephone Encounter (Signed)
Warfarin refill Last INR 03/17/2022  LAST OV

## 2022-04-22 NOTE — Telephone Encounter (Signed)
*  STAT* If patient is at the pharmacy, call can be transferred to refill team.   1. Which medications need to be refilled? (please list name of each medication and dose if known) warfarin (COUMADIN) 5 MG tablet   2. Which pharmacy/location (including street and city if local pharmacy) is medication to be sent to?  [place], [place].    3. Do they need a 30 day or 90 day supply? [place]

## 2022-04-28 ENCOUNTER — Ambulatory Visit: Payer: Medicare HMO | Attending: Cardiology

## 2022-04-28 NOTE — Progress Notes (Deleted)
Nathan Grant Date of Birth: 1946/04/07 Medical Record [id number]  History of Present Illness: Nathan Grant is seen for follow up hypertensive heart disease.  He was last seen by me in Jan 2022. He has a history of past AF/flutter, HTN, tobacco abuse and HLD. He is s/p DCCV in 2012. He has hypertensive heart disease with severe LVH. He  had a Myoview in February of 2014 due to chest pain which showed no ischemia and a normal EF. Was hospitalized back in October of 2014 with chest pain - was cathed - this was normal. Has had noted brief NSVT, accelerated junctional rhythm and bradycardia. Ecg in 2018 showed an ectopic atrial rhythm.  He was admitted in Jan. 2021 with acute respiratory distress in setting of Covid 19. Dopplers were positive for bilateral DVTs and Echo showed thrombus in the RA and PA with acute RV strain and severe pulmonary HTN. He was treated with TPA and transitioned to Eliquis. This was later discontinued due to cost. States he took Eliquis for about 3 months. He was in Atrial flutter at that time.    He was seen by primary care in December 2021 for pre operative clearance for shoulder surgery. Ecg showed Atrial flutter with controlled response and occ. PVCs. There was LVH and diffuse T wave inversion. He also has CKD stage IV. He was seen and he did not have significant symptoms from his flutter so rate control recommended with Toprol. Coumadin started since DOAC cost prohibitive. Echo showed severe LVH with normal EF and moderate biatrial enlargement. RV strain had improved.   He was seen in Dec by Nathan Grant and scheduled for a home sleep study.  Patient states he is doing well from a cardiac standpoint. He has chronic ankle swelling that is unchanged for years. He denies any chest pain, orthopnea, SOB, palpitations, dizziness or syncope. He primarily complains of right shoulder pain which limits his ability to sleep. He has seen Nathan Grant for this. He is reasonably active around  the house.  Current Outpatient Medications  Medication Sig Dispense Refill   albuterol (VENTOLIN HFA) 108 (90 Base) MCG/ACT inhaler Inhale 2 puffs into the lungs every 6 (six) hours as needed for wheezing. 18 g 0   amLODipine (NORVASC) 10 MG tablet TAKE ONE (1) TABLET BY MOUTH ONCE DAILY (Patient taking differently: Take 10 mg by mouth daily.) 90 tablet 3   amLODipine (NORVASC) 10 MG tablet Take 1 tablet by mouth daily.     Ascorbic Acid (VITAMIN C) 1000 MG tablet Take 1,000 mg by mouth daily.     blood glucose meter kit and supplies Dispense based on patient and insurance preference. Use up to four times daily as directed. (FOR ICD-10 E10.9, E11.9). 1 each 0   Blood Glucose Monitoring Suppl (ACCU-CHEK GUIDE ME) w/Device KIT USE METER TO CHECK GLUCOSE THREE TIMES DAILY BEFORE MEAL(S) 1 kit 3   chlorthalidone (HYGROTON) 50 MG tablet Take 1 tablet by mouth daily.     cyclobenzaprine (FLEXERIL) 10 MG tablet Take 1 tablet (10 mg total) by mouth 3 (three) times daily as needed for muscle spasms. 30 tablet 1   ferrous sulfate 324 (65 Fe) MG TBEC Take 1 tablet (325 mg total) by mouth 2 (two) times daily. (Patient taking differently: Take 325 mg by mouth daily.) 180 tablet 3   fluticasone (FLONASE) 50 MCG/ACT nasal spray Place 2 sprays into both nostrils daily. (Patient taking differently: Place 2 sprays into both nostrils daily as needed for  allergies.) 16 g 6   furosemide (LASIX) 40 MG tablet Take 1 tablet by mouth once daily 30 tablet 0   guaiFENesin (MUCINEX) 600 MG 12 hr tablet Take 600 mg by mouth 2 (two) times daily as needed for cough.     lidocaine (LIDODERM) 5 % Place onto the skin.     lisinopril (ZESTRIL) 10 MG tablet Take 1 tablet by mouth daily.     losartan (COZAAR) 100 MG tablet Take 1 tablet (100 mg total) by mouth daily. 90 tablet 3   metoprolol succinate (TOPROL-XL) 25 MG 24 hr tablet TAKE ONE (1) TABLET BY MOUTH ONCE DAILY (Patient taking differently: Take 25 mg by mouth daily. TAKE  ONE (1) TABLET BY MOUTH ONCE DAILY) 90 tablet 3   metoprolol succinate (TOPROL-XL) 25 MG 24 hr tablet Take 1 tablet by mouth daily.     ondansetron (ZOFRAN) 4 MG tablet Take 1 tablet (4 mg total) by mouth every 8 (eight) hours as needed for nausea or vomiting. 10 tablet 0   oxycodone (OXY-IR) 5 MG capsule Take 1 capsule (5 mg total) by mouth at bedtime as needed. 20 capsule 0   pravastatin (PRAVACHOL) 40 MG tablet Take 1 tablet (40 mg total) by mouth daily. 90 tablet 3   pravastatin (PRAVACHOL) 40 MG tablet Take 1 tablet by mouth daily.     predniSONE (STERAPRED UNI-PAK 21 TAB) 5 MG (21) TBPK tablet See admin instructions.     warfarin (COUMADIN) 5 MG tablet TAKE 1 TO 1&1/2 TABLETS BY MOUTH DAILY AS DIRECTED BY COUMADIN CLINIC 120 tablet 0   No current facility-administered medications for this visit.    Allergies  Allergen Reactions   Nsaids Other (See Comments)    Due to hypertension issues   Ibuprofen Hypertension    Patient stopped taking on his own due to his high blood pressure.    Past Medical History:  Diagnosis Date   Atrial flutter ([place])    s/p cardioversion 06/2010   Bradycardia    a. Accelerated junctional & sinus bradycardia during 01/2013 adm.   Bradycardia    Chronic headaches    Degenerative joint disease     End-stage degenerative joint disease, left knee   Dysrhythmia    Afib/A fltter   Erectile dysfunction    HTN (hypertension)    Hypertensive heart disease    a. Admit for CP 01/2013 felt due to this. Cath with normal coronary anatomy.   Microcytic anemia  03/22/2001   Morbid obesity ([place])    NSVT (nonsustained ventricular tachycardia) ([place])    a. During 01/2013 adm.   Obesity    Osteoarthritis    Tendonitis    Tobacco chew use     History of chewing tobacco usage.    Urinary tract infection     Pseudomonas urinary tract infection   VT (ventricular tachycardia) ([employer])     Past Surgical History:  Procedure Laterality Date   CARDIOVERSION  07/01/2010    LEFT HEART CATHETERIZATION WITH CORONARY ANGIOGRAM N/A 01/16/2013   Procedure: LEFT HEART CATHETERIZATION WITH CORONARY ANGIOGRAM;  Surgeon: Nathan Edgin Grant [place], MD;  Location: [facility] CATH LAB;  Service: Cardiovascular;  Laterality: N/A;   REVERSE SHOULDER ARTHROPLASTY Right 06/20/2020   Procedure: REVERSE SHOULDER ARTHROPLASTY;  Surgeon: Justice Britain, MD;  Location: [facility] ORS;  Service: Orthopedics;  Laterality: Right;  180mn   TOTAL KNEE ARTHROPLASTY  May 2009   Bilateral   VASECTOMY      Social History   Tobacco Use  Smoking Status Never  Smokeless Tobacco Current   Types: Chew    Social History   Substance and Sexual Activity  Alcohol Use No    Family History  Problem Relation Age of Onset   Alcohol abuse Brother    Hypertension Brother    Hypertension Mother     Review of Systems: The review of systems is per the HPI.  All other systems were reviewed and are negative.  Physical Exam: There were no vitals taken for this visit. GENERAL:  Well appearing, obese BM in NAD HEENT:  PERRL, EOMI, sclera are clear. Oropharynx is clear. NECK:  No jugular venous distention, carotid upstroke brisk and symmetric, no bruits, no thyromegaly or adenopathy LUNGS:  Clear to auscultation bilaterally CHEST:  Unremarkable HEART:  IRRR,  PMI not displaced or sustained,S1 and S2 within normal limits, no S3, no S4: no clicks, no rubs, no murmurs ABD:  Soft, nontender. BS +, no masses or bruits. No hepatomegaly, no splenomegaly EXT:  2 + pulses throughout, 2+ edema, no cyanosis no clubbing SKIN:  Warm and dry.  No rashes NEURO:  Alert and oriented x 3. Cranial nerves II through XII intact. PSYCH:  Cognitively intact    Wt Readings from Last 3 Encounters:  03/19/22 (!) 345 lb 3.2 oz (156.6 kg)  06/20/20 (!) 330 lb 14.6 oz (150.1 kg)  06/11/20 (!) 331 lb (150.1 kg)     LABORATORY DATA:   Lab Results  Component Value Date   WBC 9.0 07/05/2020   HGB 10.9 (L) 07/05/2020   HCT 35.4 (L)  07/05/2020   PLT 448 07/05/2020   GLUCOSE 108 (H) 06/11/2020   CHOL 157 11/14/2019   TRIG 113 11/14/2019   HDL 45 11/14/2019   LDLCALC 92 11/14/2019   ALT 12 03/18/2020   AST 17 03/18/2020   NA 142 06/11/2020   K 4.8 06/11/2020   CL 105 06/11/2020   CREATININE 1.98 (H) 06/11/2020   BUN 25 (H) 06/11/2020   CO2 31 06/11/2020   INR 3.0 03/17/2022   HGBA1C 6.4 (H) 06/21/2020    Labs dated 09/21/16: potassium 3.6, BUN 24, creatinine 1.74. A1c 5.9%.  Dated 11/28/15: cholesterol 141, triglycerides 104, HDL 46, LDL 74.   Ecg today shows atrial flutter with rate 82 bpm.  T wave inversion c/w inferolateral ischemia. I have personally reviewed and interpreted this study.   Echo 05/29/12: Study Conclusions  - Left ventricle: Wall thickness was increased in a pattern   of severe LVH. There is a suggestive of assymetric apical   hypertrphy seen in some views. Systolic function was   vigorous. The estimated ejection fraction was in the range   of 65% to 70%. Doppler parameters are consistent with   abnormal left ventricular relaxation (grade 1 diastolic   dysfunction). - Left atrium: The atrium was moderately dilated.  Myoview 05/30/12: Study Result   Mr. Finkbiner is a 77 yo who was admitted with chest pain.  A 2 day Leane Call was ordered for further evaluation.   The patient received an IV injection of Myoview and the resting Myoview images were obtained following brief delay.   The following day the patient received an IV injection of Lexiscan. He had no ST-T wave changes during the injection.  A second dose of Myoview was injected after 45 seconds. Quantitated gated SPECT images were obtained following brief delay.   The raw data images reveal minimal motion artifact.   The stress Myoview images reveal fairly smooth and  homogeneous uptake in all areas of the myocardium.  The resting Myoview images reveals similar pattern of smooth and homogeneous uptake of all areas of  the myocardium.   The cine loop images reveal fairly normal thickening and contractility of the myocardium.  The computer algorithm calculates an end diastolic volume of Q000111Q ml.  The end-systolic volume is 0000000 ml.  Ejection fraction is 37%. Actually into the left ventricular systolic function is higher than the neck - probably in the range of 55-60%.  The computer derived contours do not match  the area of uptake in the left ventricle.   Impression: No evidence of ischemia The normal left ventricular systolic function by visual inspection.   The computer generated left ventricular function is moderately depressed but I think the computer is clearly underestimating the left ventricular contractility. An echocardiogram performed several days ago confirms normal left ventricular systolic function with an EF of 65-70%.   [name], Brooke Bonito., MD, [facility]     Cardiac cath 01/26/13: Cardiac Catheterization Procedure Note   Name: Wardie Wion MRN: [id number] DOB: 10-22-1945   Procedure: Left Heart Cath, Selective Coronary Angiography, LV angiography   Indication: 77 yo BM with history of severe HTN presents with symptoms of increasing chest pain.                                   Procedural Details: The right wrist was prepped, draped, and anesthetized with 1% lidocaine. Using the modified Seldinger technique, a 5 French sheath was introduced into the right radial artery. 3 mg of verapamil was administered through the sheath, weight-based unfractionated heparin was administered intravenously. Standard Judkins catheters were used for selective coronary angiography and left ventriculography. Catheter exchanges were performed over an exchange length guidewire. There were no immediate procedural complications. A TR band was used for radial hemostasis at the completion of the procedure.  The patient was transferred to the post catheterization recovery area for further monitoring.   Procedural  Findings: Hemodynamics: AO 145/77 mean 103 mm Hg LV 150/20 mm Hg   Coronary angiography: Coronary dominance: right   Left mainstem: Normal   Left anterior descending (LAD): Very large, normal.   Left circumflex (LCx): large, 3 OM branches, normal.   Right coronary artery (RCA): Very large, normal.   Left ventriculography: Left ventricular systolic function is normal, LVEF is estimated at 60-65%, there is no significant mitral regurgitation    Final Conclusions:   1. Normal coronary anatomy. 2. Normal LV function.    Recommendations: Medical therapy with focus on BP control. Will add hydralazine to his prior meds.   Lizvet Chunn [facility] 01/16/2013, 5:01 PM  Echo 04/20/19:IMPRESSIONS     1. Normal LVEF with severe pulmonary hypertension and likely PA thrombus  in right PA, suggestive of pulmonary embolism. RV is only midly enlarged.  Findings communicated with Nathan. Sloan Leiter.   2. Left ventricular ejection fraction, by visual estimation, is 65 to  70%. The left ventricle has hyperdynamic function. There is severely  increased left ventricular hypertrophy.   3. Right ventricular pressure overload.   4. The left ventricle has no regional wall motion abnormalities.   5. Global right ventricle has moderately reduced systolic function.The  right ventricular size is mildly enlarged. Right vetricular wall thickness  was not assessed.   6. Preserved RV apical function, consistent with McConnell's sign. RV  size normal to only slightly enlarged depending on imaging window.  7. Left atrial size was mildly dilated.   8. Right atrial size was moderately dilated.   9. Small pericardial effusion.  10. The pericardial effusion is circumferential.  11. The mitral valve is normal in structure. Trivial mitral valve  regurgitation.  12. The tricuspid valve is normal in structure.  13. The aortic valve is tricuspid. Aortic valve regurgitation is trivial.  Mild aortic valve sclerosis without  stenosis.  14. Pulmonic regurgitation is moderate.  15. The pulmonic valve was grossly normal. Pulmonic valve regurgitation is  moderate.  16. Severely elevated pulmonary artery systolic pressure.  48. Suspect thromboembolism in the main pulmonary artery.  18. There is a mobile mass seen in the right PA highly suspicious for  thrombus. Additional thrombus burden not excluded.  19. The tricuspid regurgitant velocity is 3.74 Grant/s, and with an assumed  right atrial pressure of 15 mmHg, the estimated right ventricular systolic  pressure is severely elevated at 70.8 mmHg.  20. The inferior vena cava is dilated in size with <50% respiratory  variability, suggesting right atrial pressure of 15 mmHg.     Echo 05/21/20: IMPRESSIONS     1. Left ventricular ejection fraction, by estimation, is 60 to 65%. The  left ventricle has normal function. The left ventricle has no regional  wall motion abnormalities. There is severe left ventricular hypertrophy.  Left ventricular diastolic function  could not be evaluated.   2. Right ventricular systolic function is normal. The right ventricular  size is normal.   3. Left atrial size was moderately dilated.   4. Right atrial size was moderately dilated.   5. The pericardial effusion is posterior to the left ventricle.   6. The mitral valve is abnormal. Mild mitral valve regurgitation.   7. The aortic valve is tricuspid. Aortic valve regurgitation is not  visualized. Mild aortic valve sclerosis is present, with no evidence of  aortic valve stenosis.   8. Aortic dilatation noted. There is mild dilatation of the ascending  aorta, measuring 39 mm.   9. Cannot exclude a small PFO.   Comparison(s): Changes from prior study are noted. 04/20/2019: LVEF 65-70%,  RV Strain with likely thrombus in the right PA.    Assessment / Plan: 1. Hypertensive heart disease with severe LVH and chronic diastolic CHF - BPcontrol is fairly well controlled and he is on a good  medical regimen. He does have chronic edema but this is unchanged.    Recommend sodium restriction.     2. Chronic diastolic CHF - volume status is stable today.   3. Obesity - focus on weight loss and healthy diet.   4. CKD stage 4 secondary to chronic HTN.  5. Atrial flutter. S/p DCCV in 2012. Recurrent in Jan 2021 and persistent. He is asymptomatic. Rate is well controlled. Continue Toprol XL for rate control. He has a [place] Vasc score of 4 so is at high risk for embolic CVA. I have recommended long term anticoagulation. DOAC therapy is cost prohibitive. Recommend starting Coumadin. Will refer to pharmacy.    6. History of bilateral DVT and PE in setting of Covid 19 - Jan 2021. Treated acutely with TPA then Eliquis for 3 months. Significant right heart strain and pulmonary HTN noted at that time. Will update Echo now.  7. Rotator cuff injury right shoulder. If Echo looks OK will clear for surgery. Can interrupt Coumadin for 5 days for surgery.  I will follow up in 3 months.

## 2022-05-04 ENCOUNTER — Ambulatory Visit: Payer: Medicare HMO | Attending: Cardiology | Admitting: Cardiology

## 2022-06-25 ENCOUNTER — Ambulatory Visit: Payer: Medicare HMO | Admitting: Nurse Practitioner

## 2022-08-25 ENCOUNTER — Telehealth: Payer: Self-pay | Admitting: *Deleted

## 2022-08-25 NOTE — Telephone Encounter (Signed)
Called pt since overdue; there was no answer therefore left a message for him to call back to reschedule.

## 2022-10-06 ENCOUNTER — Other Ambulatory Visit: Payer: Self-pay | Admitting: Cardiology

## 2022-10-06 DIAGNOSIS — I4891 Unspecified atrial fibrillation: Secondary | ICD-10-CM

## 2022-10-06 NOTE — Telephone Encounter (Addendum)
Warfarin 5mg  refill Aflutter Last INR 03/17/22 and was due back 04/28/22-Overdue Last OV 05/03/20-Needs Appt  At 840am, called pt and there was no answer therfore left a message to call back to schedule an appt.   6/26 at 1044am called pt's primary number and there was no answer and voicemail is full.Also, called secondary line and there was no answer and no voicemail.

## 2022-10-09 ENCOUNTER — Other Ambulatory Visit: Payer: Self-pay | Admitting: Cardiology

## 2022-10-09 DIAGNOSIS — I4891 Unspecified atrial fibrillation: Secondary | ICD-10-CM

## 2022-11-05 LAB — AMB RESULTS CONSOLE CBG: Glucose: 119

## 2022-11-05 NOTE — Progress Notes (Signed)
SDOH- no new needs since 10/19/22

## 2022-11-19 ENCOUNTER — Encounter: Payer: Self-pay | Admitting: *Deleted

## 2022-11-19 NOTE — Progress Notes (Signed)
Pt attended 11/05/22 screening event where his initial b/p was 154/91 and his blood sugar was 119. At the event, pt confirmed his PCP is Misty Stanley, PA-C from the [facility] clinic and that he had Transportation insecurities, for which he was given resources at the event. Chart review indicates that pt last saw his PCP on 10/08/22, where his SDOH needs were reviewed and his b/p was 138/88. Pt was referred to Dr. Houston Siren, cardiologist for HTN and was seen on 10/19/22 where his b/p was 146/92 and his HTN meds were adjusted. Pt has a future appt with his PCP on 01/01/23 and a future cardiology appt on 02/22/23. No additional health equity team support is indicated at this time.

## 2023-09-29 ENCOUNTER — Encounter (HOSPITAL_BASED_OUTPATIENT_CLINIC_OR_DEPARTMENT_OTHER): Payer: Self-pay | Admitting: Internal Medicine

## 2023-09-29 DIAGNOSIS — R0683 Snoring: Secondary | ICD-10-CM

## 2023-09-29 DIAGNOSIS — G4710 Hypersomnia, unspecified: Secondary | ICD-10-CM

## 2023-09-29 DIAGNOSIS — R0681 Apnea, not elsewhere classified: Secondary | ICD-10-CM

## 2023-11-16 NOTE — Procedures (Signed)
 Orders only

## 2024-03-02 ENCOUNTER — Other Ambulatory Visit: Payer: Self-pay | Admitting: Nurse Practitioner

## 2024-03-02 ENCOUNTER — Ambulatory Visit
Admission: RE | Admit: 2024-03-02 | Discharge: 2024-03-02 | Disposition: A | Discharge status: Home / Self Care | Source: Ambulatory Visit | Attending: Nurse Practitioner | Admitting: Nurse Practitioner

## 2024-03-02 DIAGNOSIS — R109 Unspecified abdominal pain: Secondary | ICD-10-CM
